# Patient Record
Sex: Female | Born: 1945
Health system: Southern US, Community
[De-identification: ages and names within clinical notes are randomized; demographics above are authoritative.]

## PROBLEM LIST (undated history)

## (undated) DIAGNOSIS — M1990 Unspecified osteoarthritis, unspecified site: Secondary | ICD-10-CM

## (undated) DIAGNOSIS — I10 Essential (primary) hypertension: Secondary | ICD-10-CM

## (undated) DIAGNOSIS — C801 Malignant (primary) neoplasm, unspecified: Secondary | ICD-10-CM

## (undated) DIAGNOSIS — J45909 Unspecified asthma, uncomplicated: Secondary | ICD-10-CM

## (undated) HISTORY — PX: APPENDECTOMY: SHX54

## (undated) HISTORY — PX: TUBAL LIGATION: SHX77

## (undated) HISTORY — PX: CHOLECYSTECTOMY: SHX55

---

## 2000-09-06 ENCOUNTER — Encounter: Payer: Self-pay | Admitting: Family Medicine

## 2000-09-06 ENCOUNTER — Encounter: Admission: RE | Admit: 2000-09-06 | Discharge: 2000-09-06 | Payer: Self-pay | Admitting: Family Medicine

## 2001-03-10 ENCOUNTER — Emergency Department (HOSPITAL_COMMUNITY): Admission: EM | Admit: 2001-03-10 | Discharge: 2001-03-10 | Payer: Self-pay | Admitting: Emergency Medicine

## 2004-03-16 ENCOUNTER — Encounter: Admission: RE | Admit: 2004-03-16 | Discharge: 2004-03-16 | Payer: Self-pay | Admitting: Sports Medicine

## 2004-04-05 ENCOUNTER — Encounter: Admission: RE | Admit: 2004-04-05 | Discharge: 2004-07-04 | Payer: Self-pay | Admitting: Family Medicine

## 2004-11-17 ENCOUNTER — Emergency Department (HOSPITAL_COMMUNITY): Admission: EM | Admit: 2004-11-17 | Discharge: 2004-11-17 | Payer: Self-pay | Admitting: Emergency Medicine

## 2005-07-22 ENCOUNTER — Ambulatory Visit (HOSPITAL_COMMUNITY): Admission: EM | Admit: 2005-07-22 | Discharge: 2005-07-22 | Payer: Self-pay | Admitting: Emergency Medicine

## 2005-07-23 ENCOUNTER — Ambulatory Visit: Payer: Self-pay | Admitting: Gastroenterology

## 2006-03-11 ENCOUNTER — Emergency Department (HOSPITAL_COMMUNITY): Admission: EM | Admit: 2006-03-11 | Discharge: 2006-03-11 | Payer: Self-pay | Admitting: Emergency Medicine

## 2007-02-05 ENCOUNTER — Ambulatory Visit (HOSPITAL_COMMUNITY): Admission: RE | Admit: 2007-02-05 | Discharge: 2007-02-05 | Payer: Self-pay | Admitting: Specialist

## 2007-02-24 ENCOUNTER — Encounter: Admission: RE | Admit: 2007-02-24 | Discharge: 2007-02-24 | Payer: Self-pay | Admitting: Orthopedic Surgery

## 2007-03-03 ENCOUNTER — Encounter: Admission: RE | Admit: 2007-03-03 | Discharge: 2007-03-03 | Payer: Self-pay | Admitting: Orthopedic Surgery

## 2007-03-26 ENCOUNTER — Encounter: Admission: RE | Admit: 2007-03-26 | Discharge: 2007-03-26 | Payer: Self-pay | Admitting: Orthopedic Surgery

## 2007-05-19 ENCOUNTER — Ambulatory Visit (HOSPITAL_COMMUNITY): Admission: RE | Admit: 2007-05-19 | Discharge: 2007-05-19 | Payer: Self-pay | Admitting: General Practice

## 2007-07-23 ENCOUNTER — Ambulatory Visit (HOSPITAL_COMMUNITY): Admission: RE | Admit: 2007-07-23 | Discharge: 2007-07-23 | Payer: Self-pay | Admitting: Orthopedic Surgery

## 2007-07-24 ENCOUNTER — Ambulatory Visit: Payer: Self-pay | Admitting: Internal Medicine

## 2007-07-29 ENCOUNTER — Ambulatory Visit (HOSPITAL_COMMUNITY): Admission: RE | Admit: 2007-07-29 | Discharge: 2007-07-29 | Payer: Self-pay | Admitting: Orthopedic Surgery

## 2007-07-31 LAB — CBC WITH DIFFERENTIAL/PLATELET
BASO%: 0.4 % (ref 0.0–2.0)
Basophils Absolute: 0 10*3/uL (ref 0.0–0.1)
EOS%: 1.1 % (ref 0.0–7.0)
HGB: 10.2 g/dL — ABNORMAL LOW (ref 11.6–15.9)
MCH: 32.9 pg (ref 26.0–34.0)
MCHC: 34.6 g/dL (ref 32.0–36.0)
MCV: 95.1 fL (ref 81.0–101.0)
MONO%: 10.1 % (ref 0.0–13.0)
NEUT%: 60.8 % (ref 39.6–76.8)
RDW: 13.4 % (ref 11.3–14.5)

## 2007-08-04 LAB — COMPREHENSIVE METABOLIC PANEL
ALT: 12 U/L (ref 0–35)
AST: 20 U/L (ref 0–37)
Alkaline Phosphatase: 101 U/L (ref 39–117)
BUN: 8 mg/dL (ref 6–23)
Creatinine, Ser: 0.73 mg/dL (ref 0.40–1.20)
Potassium: 4.2 mEq/L (ref 3.5–5.3)

## 2007-08-04 LAB — IMMUNOFIXATION ELECTROPHORESIS
IgA: 71 mg/dL (ref 68–378)
IgG (Immunoglobin G), Serum: 4110 mg/dL — ABNORMAL HIGH (ref 694–1618)

## 2007-08-04 LAB — KAPPA/LAMBDA LIGHT CHAINS: Kappa free light chain: 1.75 mg/dL (ref 0.33–1.94)

## 2007-08-07 ENCOUNTER — Ambulatory Visit (HOSPITAL_COMMUNITY): Admission: RE | Admit: 2007-08-07 | Discharge: 2007-08-07 | Payer: Self-pay | Admitting: Internal Medicine

## 2007-08-07 ENCOUNTER — Encounter (INDEPENDENT_AMBULATORY_CARE_PROVIDER_SITE_OTHER): Payer: Self-pay | Admitting: Diagnostic Radiology

## 2007-08-14 LAB — CBC WITH DIFFERENTIAL/PLATELET
Basophils Absolute: 0 10*3/uL (ref 0.0–0.1)
EOS%: 1.5 % (ref 0.0–7.0)
HGB: 9.8 g/dL — ABNORMAL LOW (ref 11.6–15.9)
LYMPH%: 28.7 % (ref 14.0–48.0)
MCH: 32.4 pg (ref 26.0–34.0)
MCV: 94.3 fL (ref 81.0–101.0)
MONO%: 9.5 % (ref 0.0–13.0)
RDW: 13.1 % (ref 11.3–14.5)

## 2007-08-14 LAB — LACTATE DEHYDROGENASE: LDH: 370 U/L — ABNORMAL HIGH (ref 94–250)

## 2007-08-28 LAB — CBC WITH DIFFERENTIAL/PLATELET
Basophils Absolute: 0 10*3/uL (ref 0.0–0.1)
Eosinophils Absolute: 0 10*3/uL (ref 0.0–0.5)
HGB: 10.2 g/dL — ABNORMAL LOW (ref 11.6–15.9)
LYMPH%: 18.8 % (ref 14.0–48.0)
MCV: 93.8 fL (ref 81.0–101.0)
MONO%: 1.3 % (ref 0.0–13.0)
NEUT#: 3.3 10*3/uL (ref 1.5–6.5)
NEUT%: 79.6 % — ABNORMAL HIGH (ref 39.6–76.8)
Platelets: 138 10*3/uL — ABNORMAL LOW (ref 145–400)

## 2007-09-04 LAB — CBC WITH DIFFERENTIAL/PLATELET
BASO%: 0.2 % (ref 0.0–2.0)
EOS%: 0.4 % (ref 0.0–7.0)
HCT: 29.5 % — ABNORMAL LOW (ref 34.8–46.6)
LYMPH%: 8.1 % — ABNORMAL LOW (ref 14.0–48.0)
MCH: 31.8 pg (ref 26.0–34.0)
MCHC: 34 g/dL (ref 32.0–36.0)
NEUT%: 89.1 % — ABNORMAL HIGH (ref 39.6–76.8)
Platelets: 169 10*3/uL (ref 145–400)

## 2007-09-04 LAB — COMPREHENSIVE METABOLIC PANEL
ALT: 59 U/L — ABNORMAL HIGH (ref 0–35)
AST: 33 U/L (ref 0–37)
BUN: 11 mg/dL (ref 6–23)
CO2: 24 mEq/L (ref 19–32)
Creatinine, Ser: 0.7 mg/dL (ref 0.40–1.20)
Total Bilirubin: 0.6 mg/dL (ref 0.3–1.2)

## 2007-09-11 ENCOUNTER — Ambulatory Visit: Payer: Self-pay | Admitting: Internal Medicine

## 2007-09-11 LAB — LACTATE DEHYDROGENASE: LDH: 204 U/L (ref 94–250)

## 2007-09-11 LAB — CBC WITH DIFFERENTIAL/PLATELET
Basophils Absolute: 0 10*3/uL (ref 0.0–0.1)
Eosinophils Absolute: 0 10*3/uL (ref 0.0–0.5)
HCT: 26.7 % — ABNORMAL LOW (ref 34.8–46.6)
HGB: 9.2 g/dL — ABNORMAL LOW (ref 11.6–15.9)
LYMPH%: 9.8 % — ABNORMAL LOW (ref 14.0–48.0)
MCHC: 34.5 g/dL (ref 32.0–36.0)
MONO#: 0.8 10*3/uL (ref 0.1–0.9)
NEUT#: 6 10*3/uL (ref 1.5–6.5)
NEUT%: 80.1 % — ABNORMAL HIGH (ref 39.6–76.8)
Platelets: 195 10*3/uL (ref 145–400)
WBC: 7.5 10*3/uL (ref 3.9–10.0)

## 2007-09-11 LAB — COMPREHENSIVE METABOLIC PANEL
BUN: 13 mg/dL (ref 6–23)
CO2: 21 mEq/L (ref 19–32)
Calcium: 8.1 mg/dL — ABNORMAL LOW (ref 8.4–10.5)
Creatinine, Ser: 0.6 mg/dL (ref 0.40–1.20)
Glucose, Bld: 114 mg/dL — ABNORMAL HIGH (ref 70–99)
Total Bilirubin: 0.7 mg/dL (ref 0.3–1.2)

## 2007-09-17 LAB — CBC WITH DIFFERENTIAL/PLATELET
BASO%: 0.6 % (ref 0.0–2.0)
Basophils Absolute: 0 10*3/uL (ref 0.0–0.1)
HCT: 28 % — ABNORMAL LOW (ref 34.8–46.6)
LYMPH%: 27.4 % (ref 14.0–48.0)
MCH: 32.5 pg (ref 26.0–34.0)
MCHC: 34.1 g/dL (ref 32.0–36.0)
MONO#: 0.8 10*3/uL (ref 0.1–0.9)
NEUT%: 58.6 % (ref 39.6–76.8)
Platelets: 259 10*3/uL (ref 145–400)
WBC: 6.3 10*3/uL (ref 3.9–10.0)

## 2007-09-17 LAB — COMPREHENSIVE METABOLIC PANEL
ALT: 18 U/L (ref 0–35)
AST: 14 U/L (ref 0–37)
CO2: 23 mEq/L (ref 19–32)
Calcium: 8.3 mg/dL — ABNORMAL LOW (ref 8.4–10.5)
Chloride: 107 mEq/L (ref 96–112)
Sodium: 142 mEq/L (ref 135–145)
Total Protein: 7.1 g/dL (ref 6.0–8.3)

## 2007-09-25 LAB — COMPREHENSIVE METABOLIC PANEL
ALT: 30 U/L (ref 0–35)
AST: 29 U/L (ref 0–37)
Alkaline Phosphatase: 345 U/L — ABNORMAL HIGH (ref 39–117)
BUN: 10 mg/dL (ref 6–23)
Creatinine, Ser: 0.64 mg/dL (ref 0.40–1.20)
Total Bilirubin: 0.8 mg/dL (ref 0.3–1.2)

## 2007-09-25 LAB — CBC WITH DIFFERENTIAL/PLATELET
BASO%: 0.1 % (ref 0.0–2.0)
EOS%: 0.4 % (ref 0.0–7.0)
HCT: 31.8 % — ABNORMAL LOW (ref 34.8–46.6)
LYMPH%: 6 % — ABNORMAL LOW (ref 14.0–48.0)
MCH: 31.9 pg (ref 26.0–34.0)
MCHC: 33.6 g/dL (ref 32.0–36.0)
MCV: 95.1 fL (ref 81.0–101.0)
MONO%: 1.4 % (ref 0.0–13.0)
NEUT%: 92.1 % — ABNORMAL HIGH (ref 39.6–76.8)
Platelets: 221 10*3/uL (ref 145–400)
lymph#: 0.5 10*3/uL — ABNORMAL LOW (ref 0.9–3.3)

## 2007-10-01 LAB — COMPREHENSIVE METABOLIC PANEL
ALT: 102 U/L — ABNORMAL HIGH (ref 0–35)
AST: 41 U/L — ABNORMAL HIGH (ref 0–37)
CO2: 25 mEq/L (ref 19–32)
Sodium: 142 mEq/L (ref 135–145)
Total Bilirubin: 0.6 mg/dL (ref 0.3–1.2)
Total Protein: 6.8 g/dL (ref 6.0–8.3)

## 2007-10-01 LAB — CBC WITH DIFFERENTIAL/PLATELET
Basophils Absolute: 0 10*3/uL (ref 0.0–0.1)
EOS%: 6.3 % (ref 0.0–7.0)
Eosinophils Absolute: 0.4 10*3/uL (ref 0.0–0.5)
HCT: 31.3 % — ABNORMAL LOW (ref 34.8–46.6)
HGB: 10.5 g/dL — ABNORMAL LOW (ref 11.6–15.9)
MCH: 31.4 pg (ref 26.0–34.0)
MCV: 93.6 fL (ref 81.0–101.0)
MONO%: 12.4 % (ref 0.0–13.0)
NEUT#: 4 10*3/uL (ref 1.5–6.5)
NEUT%: 56.1 % (ref 39.6–76.8)

## 2007-10-01 LAB — LACTATE DEHYDROGENASE: LDH: 186 U/L (ref 94–250)

## 2007-10-06 ENCOUNTER — Ambulatory Visit: Admission: RE | Admit: 2007-10-06 | Discharge: 2007-10-22 | Payer: Self-pay | Admitting: Radiation Oncology

## 2007-10-13 ENCOUNTER — Ambulatory Visit (HOSPITAL_COMMUNITY): Admission: RE | Admit: 2007-10-13 | Discharge: 2007-10-13 | Payer: Self-pay | Admitting: Radiation Oncology

## 2007-10-14 LAB — CBC WITH DIFFERENTIAL/PLATELET
BASO%: 0.8 % (ref 0.0–2.0)
Basophils Absolute: 0.1 10*3/uL (ref 0.0–0.1)
EOS%: 2.5 % (ref 0.0–7.0)
HCT: 33.5 % — ABNORMAL LOW (ref 34.8–46.6)
LYMPH%: 23.4 % (ref 14.0–48.0)
MCH: 30.9 pg (ref 26.0–34.0)
MCHC: 32.8 g/dL (ref 32.0–36.0)
MCV: 94.3 fL (ref 81.0–101.0)
MONO%: 10.5 % (ref 0.0–13.0)
NEUT%: 62.8 % (ref 39.6–76.8)
Platelets: 260 10*3/uL (ref 145–400)

## 2007-10-15 LAB — IGG, IGA, IGM
IgA: 292 mg/dL (ref 68–378)
IgG (Immunoglobin G), Serum: 1350 mg/dL (ref 694–1618)

## 2007-10-15 LAB — COMPREHENSIVE METABOLIC PANEL
ALT: 18 U/L (ref 0–35)
AST: 16 U/L (ref 0–37)
BUN: 8 mg/dL (ref 6–23)
Creatinine, Ser: 0.44 mg/dL (ref 0.40–1.20)
Total Bilirubin: 0.6 mg/dL (ref 0.3–1.2)

## 2007-10-15 LAB — KAPPA/LAMBDA LIGHT CHAINS
Kappa free light chain: 1.03 mg/dL (ref 0.33–1.94)
Lambda Free Lght Chn: 1.85 mg/dL (ref 0.57–2.63)

## 2007-10-15 LAB — BETA 2 MICROGLOBULIN, SERUM: Beta-2 Microglobulin: 1.65 mg/L (ref 1.01–1.73)

## 2007-10-23 ENCOUNTER — Ambulatory Visit: Admission: RE | Admit: 2007-10-23 | Discharge: 2007-12-09 | Payer: Self-pay | Admitting: Radiation Oncology

## 2007-10-27 ENCOUNTER — Ambulatory Visit: Payer: Self-pay | Admitting: Internal Medicine

## 2007-10-28 ENCOUNTER — Ambulatory Visit (HOSPITAL_COMMUNITY): Admission: EM | Admit: 2007-10-28 | Discharge: 2007-10-28 | Payer: Self-pay | Admitting: Emergency Medicine

## 2007-10-28 ENCOUNTER — Encounter: Payer: Self-pay | Admitting: Internal Medicine

## 2007-11-04 LAB — BASIC METABOLIC PANEL
BUN: 8 mg/dL (ref 6–23)
Calcium: 7.2 mg/dL — ABNORMAL LOW (ref 8.4–10.5)
Creatinine, Ser: 0.55 mg/dL (ref 0.40–1.20)
Glucose, Bld: 86 mg/dL (ref 70–99)
Potassium: 3.4 mEq/L — ABNORMAL LOW (ref 3.5–5.3)

## 2007-11-05 ENCOUNTER — Ambulatory Visit: Payer: Self-pay | Admitting: Internal Medicine

## 2007-11-26 LAB — BASIC METABOLIC PANEL
BUN: 9 mg/dL (ref 6–23)
Chloride: 106 mEq/L (ref 96–112)
Glucose, Bld: 100 mg/dL — ABNORMAL HIGH (ref 70–99)
Potassium: 3.2 mEq/L — ABNORMAL LOW (ref 3.5–5.3)

## 2007-12-02 ENCOUNTER — Ambulatory Visit: Payer: Self-pay | Admitting: Internal Medicine

## 2007-12-02 LAB — CBC WITH DIFFERENTIAL/PLATELET
Eosinophils Absolute: 0.3 10*3/uL (ref 0.0–0.5)
LYMPH%: 9.5 % — ABNORMAL LOW (ref 14.0–48.0)
MCV: 90.2 fL (ref 81.0–101.0)
MONO%: 19.7 % — ABNORMAL HIGH (ref 0.0–13.0)
NEUT#: 5.4 10*3/uL (ref 1.5–6.5)
Platelets: 165 10*3/uL (ref 145–400)
RBC: 3.83 10*6/uL (ref 3.70–5.32)

## 2007-12-04 LAB — COMPREHENSIVE METABOLIC PANEL
ALT: 19 U/L (ref 0–35)
Albumin: 3.8 g/dL (ref 3.5–5.2)
CO2: 28 mEq/L (ref 19–32)
Calcium: 8.2 mg/dL — ABNORMAL LOW (ref 8.4–10.5)
Chloride: 102 mEq/L (ref 96–112)
Glucose, Bld: 108 mg/dL — ABNORMAL HIGH (ref 70–99)
Potassium: 3 mEq/L — ABNORMAL LOW (ref 3.5–5.3)
Sodium: 141 mEq/L (ref 135–145)
Total Bilirubin: 1 mg/dL (ref 0.3–1.2)
Total Protein: 6.2 g/dL (ref 6.0–8.3)

## 2007-12-04 LAB — KAPPA/LAMBDA LIGHT CHAINS
Kappa:Lambda Ratio: 1.1 (ref 0.26–1.65)
Lambda Free Lght Chn: 1.34 mg/dL (ref 0.57–2.63)

## 2007-12-04 LAB — LACTATE DEHYDROGENASE: LDH: 213 U/L (ref 94–250)

## 2007-12-04 LAB — IGG, IGA, IGM
IgA: 208 mg/dL (ref 68–378)
IgG (Immunoglobin G), Serum: 888 mg/dL (ref 694–1618)
IgM, Serum: 81 mg/dL (ref 60–263)

## 2007-12-04 LAB — BETA 2 MICROGLOBULIN, SERUM: Beta-2 Microglobulin: 1.24 mg/L (ref 1.01–1.73)

## 2007-12-06 ENCOUNTER — Emergency Department (HOSPITAL_COMMUNITY): Admission: EM | Admit: 2007-12-06 | Discharge: 2007-12-06 | Payer: Self-pay | Admitting: Emergency Medicine

## 2007-12-12 ENCOUNTER — Ambulatory Visit (HOSPITAL_COMMUNITY): Admission: RE | Admit: 2007-12-12 | Discharge: 2007-12-12 | Payer: Self-pay | Admitting: Radiation Oncology

## 2007-12-24 LAB — BASIC METABOLIC PANEL
BUN: 15 mg/dL (ref 6–23)
CO2: 24 mEq/L (ref 19–32)
Chloride: 103 mEq/L (ref 96–112)
Potassium: 3.4 mEq/L — ABNORMAL LOW (ref 3.5–5.3)

## 2008-01-06 LAB — CBC WITH DIFFERENTIAL/PLATELET
Basophils Absolute: 0 10*3/uL (ref 0.0–0.1)
Eosinophils Absolute: 0 10*3/uL (ref 0.0–0.5)
HGB: 12.4 g/dL (ref 11.6–15.9)
LYMPH%: 9.2 % — ABNORMAL LOW (ref 14.0–48.0)
MCV: 89.1 fL (ref 81.0–101.0)
MONO%: 17.3 % — ABNORMAL HIGH (ref 0.0–13.0)
NEUT#: 3.4 10*3/uL (ref 1.5–6.5)
Platelets: 161 10*3/uL (ref 145–400)

## 2008-01-06 LAB — COMPREHENSIVE METABOLIC PANEL
Alkaline Phosphatase: 118 U/L — ABNORMAL HIGH (ref 39–117)
BUN: 8 mg/dL (ref 6–23)
Creatinine, Ser: 0.51 mg/dL (ref 0.40–1.20)
Glucose, Bld: 94 mg/dL (ref 70–99)
Total Bilirubin: 1.4 mg/dL — ABNORMAL HIGH (ref 0.3–1.2)

## 2008-01-19 ENCOUNTER — Ambulatory Visit: Payer: Self-pay | Admitting: Internal Medicine

## 2008-01-21 LAB — BASIC METABOLIC PANEL
BUN: 7 mg/dL (ref 6–23)
CO2: 28 mEq/L (ref 19–32)
Glucose, Bld: 71 mg/dL (ref 70–99)
Potassium: 3 mEq/L — ABNORMAL LOW (ref 3.5–5.3)
Sodium: 142 mEq/L (ref 135–145)

## 2008-01-26 ENCOUNTER — Ambulatory Visit: Payer: Self-pay | Admitting: Internal Medicine

## 2008-01-26 ENCOUNTER — Encounter: Payer: Self-pay | Admitting: Internal Medicine

## 2008-01-26 ENCOUNTER — Ambulatory Visit (HOSPITAL_COMMUNITY): Admission: RE | Admit: 2008-01-26 | Discharge: 2008-01-26 | Payer: Self-pay | Admitting: Internal Medicine

## 2008-02-18 LAB — BASIC METABOLIC PANEL
CO2: 24 mEq/L (ref 19–32)
Calcium: 8.9 mg/dL (ref 8.4–10.5)
Creatinine, Ser: 0.51 mg/dL (ref 0.40–1.20)
Glucose, Bld: 88 mg/dL (ref 70–99)

## 2008-02-19 ENCOUNTER — Ambulatory Visit: Payer: Self-pay

## 2008-02-19 ENCOUNTER — Encounter: Payer: Self-pay | Admitting: Internal Medicine

## 2008-02-19 LAB — URIC ACID: Uric Acid, Serum: 5.7 mg/dL (ref 2.4–7.0)

## 2008-03-01 LAB — CBC WITH DIFFERENTIAL/PLATELET
Basophils Absolute: 0 10*3/uL (ref 0.0–0.1)
Eosinophils Absolute: 0.1 10*3/uL (ref 0.0–0.5)
HGB: 12 g/dL (ref 11.6–15.9)
LYMPH%: 3 % — ABNORMAL LOW (ref 14.0–48.0)
MCV: 94.6 fL (ref 81.0–101.0)
MONO#: 0 10*3/uL — ABNORMAL LOW (ref 0.1–0.9)
NEUT#: 14.9 10*3/uL — ABNORMAL HIGH (ref 1.5–6.5)
Platelets: 81 10*3/uL — ABNORMAL LOW (ref 145–400)
RBC: 3.74 10*6/uL (ref 3.70–5.32)
RDW: 16.6 % — ABNORMAL HIGH (ref 11.3–14.5)
WBC: 15.5 10*3/uL — ABNORMAL HIGH (ref 3.9–10.0)

## 2008-03-29 ENCOUNTER — Ambulatory Visit: Payer: Self-pay | Admitting: Internal Medicine

## 2008-05-04 LAB — CBC WITH DIFFERENTIAL/PLATELET
Basophils Absolute: 0 10*3/uL (ref 0.0–0.1)
EOS%: 10.2 % — ABNORMAL HIGH (ref 0.0–7.0)
HCT: 35 % (ref 34.8–46.6)
HGB: 11.8 g/dL (ref 11.6–15.9)
MCH: 30.8 pg (ref 26.0–34.0)
MONO#: 0.9 10*3/uL (ref 0.1–0.9)
NEUT%: 55 % (ref 39.6–76.8)
Platelets: 164 10*3/uL (ref 145–400)
lymph#: 1.1 10*3/uL (ref 0.9–3.3)

## 2008-05-04 LAB — COMPREHENSIVE METABOLIC PANEL
Alkaline Phosphatase: 103 U/L (ref 39–117)
BUN: 6 mg/dL (ref 6–23)
CO2: 29 mEq/L (ref 19–32)
Glucose, Bld: 85 mg/dL (ref 70–99)
Sodium: 140 mEq/L (ref 135–145)
Total Bilirubin: 0.7 mg/dL (ref 0.3–1.2)
Total Protein: 6.6 g/dL (ref 6.0–8.3)

## 2008-06-17 ENCOUNTER — Ambulatory Visit (HOSPITAL_COMMUNITY): Admission: RE | Admit: 2008-06-17 | Discharge: 2008-06-17 | Payer: Self-pay | Admitting: Internal Medicine

## 2008-06-23 ENCOUNTER — Ambulatory Visit: Payer: Self-pay | Admitting: Internal Medicine

## 2008-06-29 LAB — CBC WITH DIFFERENTIAL/PLATELET
Basophils Absolute: 0 10*3/uL (ref 0.0–0.1)
EOS%: 0.8 % (ref 0.0–7.0)
HCT: 36.7 % (ref 34.8–46.6)
HGB: 12.4 g/dL (ref 11.6–15.9)
LYMPH%: 31.9 % (ref 14.0–48.0)
MCH: 30.9 pg (ref 26.0–34.0)
MCHC: 33.8 g/dL (ref 32.0–36.0)
MCV: 91.5 fL (ref 81.0–101.0)
MONO%: 10.8 % (ref 0.0–13.0)
NEUT%: 56.1 % (ref 39.6–76.8)
Platelets: 149 10*3/uL (ref 145–400)

## 2008-06-29 LAB — COMPREHENSIVE METABOLIC PANEL
AST: 21 U/L (ref 0–37)
BUN: 7 mg/dL (ref 6–23)
Calcium: 8.8 mg/dL (ref 8.4–10.5)
Chloride: 103 mEq/L (ref 96–112)
Creatinine, Ser: 0.57 mg/dL (ref 0.40–1.20)
Total Bilirubin: 0.6 mg/dL (ref 0.3–1.2)

## 2008-09-08 ENCOUNTER — Ambulatory Visit (HOSPITAL_COMMUNITY): Admission: RE | Admit: 2008-09-08 | Discharge: 2008-09-08 | Payer: Self-pay | Admitting: Internal Medicine

## 2008-09-15 ENCOUNTER — Ambulatory Visit: Payer: Self-pay | Admitting: Internal Medicine

## 2008-09-23 ENCOUNTER — Ambulatory Visit: Payer: Self-pay | Admitting: Internal Medicine

## 2008-09-23 LAB — CBC WITH DIFFERENTIAL/PLATELET
BASO%: 0.3 % (ref 0.0–2.0)
Basophils Absolute: 0 10*3/uL (ref 0.0–0.1)
EOS%: 1.6 % (ref 0.0–7.0)
HCT: 36.5 % (ref 34.8–46.6)
HGB: 12.3 g/dL (ref 11.6–15.9)
LYMPH%: 21.8 % (ref 14.0–48.0)
MCH: 31.3 pg (ref 26.0–34.0)
MCHC: 33.7 g/dL (ref 32.0–36.0)
MCV: 93 fL (ref 81.0–101.0)
MONO%: 8 % (ref 0.0–13.0)
NEUT%: 68.3 % (ref 39.6–76.8)
lymph#: 1.7 10*3/uL (ref 0.9–3.3)

## 2008-09-24 LAB — BETA 2 MICROGLOBULIN, SERUM: Beta-2 Microglobulin: 1.42 mg/L (ref 1.01–1.73)

## 2008-09-24 LAB — COMPREHENSIVE METABOLIC PANEL
ALT: 18 U/L (ref 0–35)
AST: 22 U/L (ref 0–37)
Alkaline Phosphatase: 116 U/L (ref 39–117)
BUN: 16 mg/dL (ref 6–23)
Calcium: 9.1 mg/dL (ref 8.4–10.5)
Creatinine, Ser: 0.7 mg/dL (ref 0.40–1.20)
Total Bilirubin: 1 mg/dL (ref 0.3–1.2)

## 2008-09-24 LAB — IGG, IGA, IGM
IgA: 77 mg/dL (ref 68–378)
IgM, Serum: 82 mg/dL (ref 60–263)

## 2008-12-24 ENCOUNTER — Ambulatory Visit: Payer: Self-pay | Admitting: Internal Medicine

## 2008-12-28 LAB — CBC WITH DIFFERENTIAL/PLATELET
BASO%: 0.3 % (ref 0.0–2.0)
Basophils Absolute: 0 10*3/uL (ref 0.0–0.1)
EOS%: 1.9 % (ref 0.0–7.0)
HCT: 37.5 % (ref 34.8–46.6)
HGB: 12.4 g/dL (ref 11.6–15.9)
MCH: 31.1 pg (ref 25.1–34.0)
MCHC: 33 g/dL (ref 31.5–36.0)
MCV: 94.1 fL (ref 79.5–101.0)
MONO%: 9.1 % (ref 0.0–14.0)
NEUT%: 49.2 % (ref 38.4–76.8)
RDW: 14.1 % (ref 11.2–14.5)

## 2008-12-29 LAB — IGG, IGA, IGM
IgA: 96 mg/dL (ref 68–378)
IgM, Serum: 138 mg/dL (ref 60–263)

## 2008-12-29 LAB — COMPREHENSIVE METABOLIC PANEL
ALT: 13 U/L (ref 0–35)
AST: 15 U/L (ref 0–37)
Alkaline Phosphatase: 110 U/L (ref 39–117)
BUN: 11 mg/dL (ref 6–23)
Creatinine, Ser: 0.66 mg/dL (ref 0.40–1.20)

## 2008-12-29 LAB — KAPPA/LAMBDA LIGHT CHAINS
Kappa:Lambda Ratio: 0.45 (ref 0.26–1.65)
Lambda Free Lght Chn: 2.06 mg/dL (ref 0.57–2.63)

## 2008-12-29 LAB — BETA 2 MICROGLOBULIN, SERUM: Beta-2 Microglobulin: 1.46 mg/L (ref 1.01–1.73)

## 2009-03-30 ENCOUNTER — Ambulatory Visit: Payer: Self-pay | Admitting: Internal Medicine

## 2009-04-01 LAB — CBC WITH DIFFERENTIAL/PLATELET
BASO%: 0.3 % (ref 0.0–2.0)
EOS%: 1.1 % (ref 0.0–7.0)
LYMPH%: 20.4 % (ref 14.0–49.7)
MCH: 32 pg (ref 25.1–34.0)
MCHC: 34.5 g/dL (ref 31.5–36.0)
MCV: 92.8 fL (ref 79.5–101.0)
MONO%: 5.8 % (ref 0.0–14.0)
NEUT%: 72.4 % (ref 38.4–76.8)
Platelets: 166 10*3/uL (ref 145–400)
RBC: 4.17 10*6/uL (ref 3.70–5.45)
WBC: 9.1 10*3/uL (ref 3.9–10.3)

## 2009-04-04 LAB — COMPREHENSIVE METABOLIC PANEL
ALT: 19 U/L (ref 0–35)
Alkaline Phosphatase: 89 U/L (ref 39–117)
Creatinine, Ser: 0.68 mg/dL (ref 0.40–1.20)
Sodium: 141 mEq/L (ref 135–145)
Total Bilirubin: 0.6 mg/dL (ref 0.3–1.2)
Total Protein: 7.6 g/dL (ref 6.0–8.3)

## 2009-04-04 LAB — IGG, IGA, IGM
IgG (Immunoglobin G), Serum: 1300 mg/dL (ref 694–1618)
IgM, Serum: 114 mg/dL (ref 60–263)

## 2009-04-04 LAB — KAPPA/LAMBDA LIGHT CHAINS
Kappa:Lambda Ratio: 0.51 (ref 0.26–1.65)
Lambda Free Lght Chn: 2.05 mg/dL (ref 0.57–2.63)

## 2009-04-04 LAB — BETA 2 MICROGLOBULIN, SERUM: Beta-2 Microglobulin: 1.45 mg/L (ref 1.01–1.73)

## 2009-07-11 ENCOUNTER — Ambulatory Visit: Payer: Self-pay | Admitting: Internal Medicine

## 2009-10-08 ENCOUNTER — Emergency Department (HOSPITAL_COMMUNITY): Admission: EM | Admit: 2009-10-08 | Discharge: 2009-10-08 | Payer: Self-pay | Admitting: Emergency Medicine

## 2009-10-12 ENCOUNTER — Ambulatory Visit: Payer: Self-pay | Admitting: Internal Medicine

## 2009-10-17 LAB — CBC WITH DIFFERENTIAL/PLATELET
BASO%: 0.1 % (ref 0.0–2.0)
Basophils Absolute: 0 10*3/uL (ref 0.0–0.1)
EOS%: 1.5 % (ref 0.0–7.0)
HCT: 39.1 % (ref 34.8–46.6)
HGB: 12.5 g/dL (ref 11.6–15.9)
MCH: 29.4 pg (ref 25.1–34.0)
MONO#: 0.5 10*3/uL (ref 0.1–0.9)
NEUT%: 50.8 % (ref 38.4–76.8)
RDW: 13.2 % (ref 11.2–14.5)
WBC: 8.1 10*3/uL (ref 3.9–10.3)
lymph#: 3.3 10*3/uL (ref 0.9–3.3)

## 2009-10-18 LAB — COMPREHENSIVE METABOLIC PANEL
Alkaline Phosphatase: 90 U/L (ref 39–117)
CO2: 24 mEq/L (ref 19–32)
Chloride: 105 mEq/L (ref 96–112)
Creatinine, Ser: 0.59 mg/dL (ref 0.40–1.20)
Glucose, Bld: 78 mg/dL (ref 70–99)
Potassium: 3.9 mEq/L (ref 3.5–5.3)
Total Bilirubin: 0.5 mg/dL (ref 0.3–1.2)
Total Protein: 7.4 g/dL (ref 6.0–8.3)

## 2009-10-18 LAB — IGG, IGA, IGM
IgA: 144 mg/dL (ref 68–378)
IgG (Immunoglobin G), Serum: 1510 mg/dL (ref 694–1618)
IgM, Serum: 147 mg/dL (ref 60–263)

## 2009-10-18 LAB — KAPPA/LAMBDA LIGHT CHAINS: Kappa free light chain: 1.21 mg/dL (ref 0.33–1.94)

## 2009-10-18 LAB — BETA 2 MICROGLOBULIN, SERUM: Beta-2 Microglobulin: 1.37 mg/L (ref 1.01–1.73)

## 2010-01-13 ENCOUNTER — Ambulatory Visit: Payer: Self-pay | Admitting: Internal Medicine

## 2010-01-17 LAB — CBC WITH DIFFERENTIAL/PLATELET
Basophils Absolute: 0 10*3/uL (ref 0.0–0.1)
Eosinophils Absolute: 0.2 10*3/uL (ref 0.0–0.5)
HCT: 37.7 % (ref 34.8–46.6)
HGB: 12.6 g/dL (ref 11.6–15.9)
MCV: 95.1 fL (ref 79.5–101.0)
MONO%: 9.1 % (ref 0.0–14.0)
NEUT#: 6.5 10*3/uL (ref 1.5–6.5)
NEUT%: 61.7 % (ref 38.4–76.8)
Platelets: 180 10*3/uL (ref 145–400)
RDW: 14.3 % (ref 11.2–14.5)

## 2010-01-18 LAB — COMPREHENSIVE METABOLIC PANEL
ALT: 18 U/L (ref 0–35)
Albumin: 4.3 g/dL (ref 3.5–5.2)
CO2: 24 mEq/L (ref 19–32)
Calcium: 9.3 mg/dL (ref 8.4–10.5)
Chloride: 104 mEq/L (ref 96–112)
Creatinine, Ser: 0.72 mg/dL (ref 0.40–1.20)
Potassium: 3.7 mEq/L (ref 3.5–5.3)
Total Protein: 7.5 g/dL (ref 6.0–8.3)

## 2010-01-18 LAB — KAPPA/LAMBDA LIGHT CHAINS
Kappa:Lambda Ratio: 0.65 (ref 0.26–1.65)
Lambda Free Lght Chn: 1.94 mg/dL (ref 0.57–2.63)

## 2010-01-18 LAB — IGG, IGA, IGM: IgM, Serum: 142 mg/dL (ref 60–263)

## 2010-01-18 LAB — LACTATE DEHYDROGENASE: LDH: 158 U/L (ref 94–250)

## 2010-04-19 ENCOUNTER — Ambulatory Visit: Payer: Self-pay | Admitting: Internal Medicine

## 2010-04-21 LAB — CBC WITH DIFFERENTIAL/PLATELET
Basophils Absolute: 0 10*3/uL (ref 0.0–0.1)
Eosinophils Absolute: 0.2 10*3/uL (ref 0.0–0.5)
HCT: 37.3 % (ref 34.8–46.6)
HGB: 12.4 g/dL (ref 11.6–15.9)
NEUT#: 4.6 10*3/uL (ref 1.5–6.5)
NEUT%: 55.3 % (ref 38.4–76.8)
RDW: 13.9 % (ref 11.2–14.5)
lymph#: 2.8 10*3/uL (ref 0.9–3.3)

## 2010-04-25 LAB — COMPREHENSIVE METABOLIC PANEL
Albumin: 4.2 g/dL (ref 3.5–5.2)
BUN: 20 mg/dL (ref 6–23)
CO2: 25 mEq/L (ref 19–32)
Calcium: 9.8 mg/dL (ref 8.4–10.5)
Chloride: 101 mEq/L (ref 96–112)
Creatinine, Ser: 0.61 mg/dL (ref 0.40–1.20)
Glucose, Bld: 98 mg/dL (ref 70–99)
Potassium: 4 mEq/L (ref 3.5–5.3)

## 2010-04-25 LAB — IGG, IGA, IGM
IgA: 172 mg/dL (ref 68–378)
IgG (Immunoglobin G), Serum: 1480 mg/dL (ref 694–1618)

## 2010-04-25 LAB — LACTATE DEHYDROGENASE: LDH: 166 U/L (ref 94–250)

## 2010-04-25 LAB — KAPPA/LAMBDA LIGHT CHAINS: Lambda Free Lght Chn: 2 mg/dL (ref 0.57–2.63)

## 2010-07-02 ENCOUNTER — Ambulatory Visit (HOSPITAL_COMMUNITY): Admission: EM | Admit: 2010-07-02 | Discharge: 2010-07-02 | Payer: Self-pay | Admitting: Emergency Medicine

## 2010-07-17 ENCOUNTER — Ambulatory Visit: Payer: Self-pay | Admitting: Internal Medicine

## 2010-07-19 LAB — CBC WITH DIFFERENTIAL/PLATELET
Basophils Absolute: 0 10*3/uL (ref 0.0–0.1)
EOS%: 2.5 % (ref 0.0–7.0)
Eosinophils Absolute: 0.2 10*3/uL (ref 0.0–0.5)
HGB: 11.7 g/dL (ref 11.6–15.9)
MCH: 29.8 pg (ref 25.1–34.0)
NEUT#: 4.8 10*3/uL (ref 1.5–6.5)
RDW: 14.4 % (ref 11.2–14.5)
WBC: 9.8 10*3/uL (ref 3.9–10.3)
lymph#: 3.8 10*3/uL — ABNORMAL HIGH (ref 0.9–3.3)

## 2010-07-20 LAB — IGG, IGA, IGM
IgA: 209 mg/dL (ref 68–378)
IgM, Serum: 192 mg/dL (ref 60–263)

## 2010-07-20 LAB — COMPREHENSIVE METABOLIC PANEL
AST: 30 U/L (ref 0–37)
Albumin: 4 g/dL (ref 3.5–5.2)
BUN: 22 mg/dL (ref 6–23)
Calcium: 9.8 mg/dL (ref 8.4–10.5)
Chloride: 105 mEq/L (ref 96–112)
Potassium: 4.1 mEq/L (ref 3.5–5.3)

## 2010-07-20 LAB — KAPPA/LAMBDA LIGHT CHAINS
Kappa:Lambda Ratio: 0.53 (ref 0.26–1.65)
Lambda Free Lght Chn: 2.26 mg/dL (ref 0.57–2.63)

## 2010-07-20 LAB — LACTATE DEHYDROGENASE: LDH: 183 U/L (ref 94–250)

## 2010-10-18 ENCOUNTER — Ambulatory Visit: Payer: Self-pay | Admitting: Internal Medicine

## 2010-10-24 LAB — CBC WITH DIFFERENTIAL/PLATELET
BASO%: 0.3 % (ref 0.0–2.0)
EOS%: 1.9 % (ref 0.0–7.0)
MCH: 31.3 pg (ref 25.1–34.0)
MCHC: 33.7 g/dL (ref 31.5–36.0)
RBC: 4.13 10*6/uL (ref 3.70–5.45)
RDW: 13.7 % (ref 11.2–14.5)
lymph#: 3.5 10*3/uL — ABNORMAL HIGH (ref 0.9–3.3)

## 2010-10-25 LAB — COMPREHENSIVE METABOLIC PANEL
ALT: 13 U/L (ref 0–35)
AST: 18 U/L (ref 0–37)
Albumin: 4 g/dL (ref 3.5–5.2)
Alkaline Phosphatase: 88 U/L (ref 39–117)
BUN: 13 mg/dL (ref 6–23)
CO2: 26 mEq/L (ref 19–32)
Calcium: 8.9 mg/dL (ref 8.4–10.5)
Chloride: 103 mEq/L (ref 96–112)
Creatinine, Ser: 0.6 mg/dL (ref 0.40–1.20)
Glucose, Bld: 82 mg/dL (ref 70–99)
Potassium: 4 mEq/L (ref 3.5–5.3)
Sodium: 140 mEq/L (ref 135–145)
Total Bilirubin: 0.6 mg/dL (ref 0.3–1.2)
Total Protein: 7.4 g/dL (ref 6.0–8.3)

## 2010-10-25 LAB — LACTATE DEHYDROGENASE: LDH: 173 U/L (ref 94–250)

## 2010-10-25 LAB — KAPPA/LAMBDA LIGHT CHAINS
Kappa free light chain: 1.56 mg/dL (ref 0.33–1.94)
Kappa:Lambda Ratio: 0.93 (ref 0.26–1.65)
Lambda Free Lght Chn: 1.68 mg/dL (ref 0.57–2.63)

## 2010-10-25 LAB — IGG, IGA, IGM
IgA: 201 mg/dL (ref 68–378)
IgG (Immunoglobin G), Serum: 1810 mg/dL — ABNORMAL HIGH (ref 694–1618)
IgM, Serum: 175 mg/dL (ref 60–263)

## 2010-10-25 LAB — BETA 2 MICROGLOBULIN, SERUM: Beta-2 Microglobulin: 1.85 mg/L — ABNORMAL HIGH (ref 1.01–1.73)

## 2010-11-11 ENCOUNTER — Encounter: Payer: Self-pay | Admitting: Gastroenterology

## 2010-11-12 ENCOUNTER — Encounter: Payer: Self-pay | Admitting: Orthopedic Surgery

## 2011-01-04 LAB — CBC
HCT: 40.7 % (ref 36.0–46.0)
Hemoglobin: 13.7 g/dL (ref 12.0–15.0)
MCV: 92.1 fL (ref 78.0–100.0)
RBC: 4.42 MIL/uL (ref 3.87–5.11)
WBC: 8.5 10*3/uL (ref 4.0–10.5)

## 2011-01-04 LAB — DIFFERENTIAL
Basophils Absolute: 0 10*3/uL (ref 0.0–0.1)
Eosinophils Relative: 1 % (ref 0–5)
Lymphocytes Relative: 15 % (ref 12–46)
Lymphs Abs: 1.3 10*3/uL (ref 0.7–4.0)
Monocytes Absolute: 0.6 10*3/uL (ref 0.1–1.0)
Neutro Abs: 6.5 10*3/uL (ref 1.7–7.7)

## 2011-01-04 LAB — BASIC METABOLIC PANEL
Chloride: 101 mEq/L (ref 96–112)
GFR calc Af Amer: >60 mL/min (ref >60)
GFR calc non Af Amer: >60 mL/min (ref >60)
Potassium: 3.4 mEq/L — ABNORMAL LOW (ref 3.5–5.1)
Sodium: 139 mEq/L (ref 135–145)

## 2011-01-22 LAB — POCT I-STAT, CHEM 8
BUN: 8 mg/dL (ref 6–23)
Calcium, Ion: 1.17 mmol/L (ref 1.12–1.32)
Creatinine, Ser: 0.6 mg/dL (ref 0.4–1.2)
Glucose, Bld: 94 mg/dL (ref 70–99)
Hemoglobin: 12.9 g/dL (ref 12.0–15.0)
Sodium: 138 mEq/L (ref 135–145)
TCO2: 29 mmol/L (ref 0–100)

## 2011-02-06 ENCOUNTER — Other Ambulatory Visit: Payer: Self-pay | Admitting: Internal Medicine

## 2011-02-06 ENCOUNTER — Encounter (HOSPITAL_BASED_OUTPATIENT_CLINIC_OR_DEPARTMENT_OTHER): Payer: Medicare Other | Admitting: Internal Medicine

## 2011-02-06 DIAGNOSIS — C9000 Multiple myeloma not having achieved remission: Secondary | ICD-10-CM

## 2011-02-06 LAB — CBC WITH DIFFERENTIAL/PLATELET
Basophils Absolute: 0 10*3/uL (ref 0.0–0.1)
Eosinophils Absolute: 0.1 10*3/uL (ref 0.0–0.5)
HCT: 34.4 % — ABNORMAL LOW (ref 34.8–46.6)
HGB: 11.4 g/dL — ABNORMAL LOW (ref 11.6–15.9)
LYMPH%: 39.5 % (ref 14.0–49.7)
MCV: 93.9 fL (ref 79.5–101.0)
MONO#: 0.7 10*3/uL (ref 0.1–0.9)
MONO%: 9.2 % (ref 0.0–14.0)
NEUT#: 3.5 10*3/uL (ref 1.5–6.5)
Platelets: 155 10*3/uL (ref 145–400)
RBC: 3.66 10*6/uL — ABNORMAL LOW (ref 3.70–5.45)
WBC: 7.1 10*3/uL (ref 3.9–10.3)

## 2011-02-07 LAB — COMPREHENSIVE METABOLIC PANEL
Albumin: 3.9 g/dL (ref 3.5–5.2)
Alkaline Phosphatase: 93 U/L (ref 39–117)
BUN: 12 mg/dL (ref 6–23)
CO2: 28 mEq/L (ref 19–32)
Glucose, Bld: 72 mg/dL (ref 70–99)
Potassium: 4 mEq/L (ref 3.5–5.3)
Sodium: 140 mEq/L (ref 135–145)
Total Bilirubin: 0.3 mg/dL (ref 0.3–1.2)
Total Protein: 6.9 g/dL (ref 6.0–8.3)

## 2011-02-07 LAB — IGG, IGA, IGM
IgA: 184 mg/dL (ref 68–378)
IgG (Immunoglobin G), Serum: 1330 mg/dL (ref 694–1618)

## 2011-02-07 LAB — LACTATE DEHYDROGENASE: LDH: 203 U/L (ref 94–250)

## 2011-02-13 ENCOUNTER — Other Ambulatory Visit: Payer: Self-pay | Admitting: Internal Medicine

## 2011-02-13 ENCOUNTER — Encounter (HOSPITAL_BASED_OUTPATIENT_CLINIC_OR_DEPARTMENT_OTHER): Payer: Medicare Other | Admitting: Internal Medicine

## 2011-02-13 DIAGNOSIS — M545 Low back pain, unspecified: Secondary | ICD-10-CM

## 2011-02-13 DIAGNOSIS — C9000 Multiple myeloma not having achieved remission: Secondary | ICD-10-CM

## 2011-02-19 ENCOUNTER — Ambulatory Visit (HOSPITAL_COMMUNITY)
Admission: RE | Admit: 2011-02-19 | Discharge: 2011-02-19 | Disposition: A | Discharge status: Home / Self Care | Payer: Medicare Other | Source: Ambulatory Visit | Attending: Internal Medicine | Admitting: Internal Medicine

## 2011-02-19 DIAGNOSIS — M129 Arthropathy, unspecified: Secondary | ICD-10-CM | POA: Insufficient documentation

## 2011-02-19 DIAGNOSIS — M47817 Spondylosis without myelopathy or radiculopathy, lumbosacral region: Secondary | ICD-10-CM | POA: Insufficient documentation

## 2011-02-19 DIAGNOSIS — M545 Low back pain, unspecified: Secondary | ICD-10-CM | POA: Insufficient documentation

## 2011-02-19 DIAGNOSIS — Z87898 Personal history of other specified conditions: Secondary | ICD-10-CM | POA: Insufficient documentation

## 2011-02-19 MED ORDER — GADOBENATE DIMEGLUMINE 529 MG/ML IV SOLN
15.0000 mL | Freq: Once | INTRAVENOUS | Status: AC | PRN
  Administered 2011-02-19: 15 mL via INTRAVENOUS

## 2011-05-09 ENCOUNTER — Other Ambulatory Visit: Payer: Self-pay | Admitting: Internal Medicine

## 2011-05-09 ENCOUNTER — Encounter (HOSPITAL_BASED_OUTPATIENT_CLINIC_OR_DEPARTMENT_OTHER): Payer: Medicare Other | Admitting: Internal Medicine

## 2011-05-09 DIAGNOSIS — C9000 Multiple myeloma not having achieved remission: Secondary | ICD-10-CM

## 2011-05-09 LAB — CBC WITH DIFFERENTIAL/PLATELET
Basophils Absolute: 0 10*3/uL (ref 0.0–0.1)
Eosinophils Absolute: 0.2 10*3/uL (ref 0.0–0.5)
HCT: 35.7 % (ref 34.8–46.6)
LYMPH%: 39.2 % (ref 14.0–49.7)
MCV: 94.2 fL (ref 79.5–101.0)
MONO#: 0.7 10*3/uL (ref 0.1–0.9)
MONO%: 9.2 % (ref 0.0–14.0)
NEUT#: 3.4 10*3/uL (ref 1.5–6.5)
NEUT%: 48.3 % (ref 38.4–76.8)
Platelets: 141 10*3/uL — ABNORMAL LOW (ref 145–400)
WBC: 7.1 10*3/uL (ref 3.9–10.3)

## 2011-05-10 LAB — COMPREHENSIVE METABOLIC PANEL
ALT: 24 U/L (ref 0–35)
CO2: 21 mEq/L (ref 19–32)
Calcium: 8.9 mg/dL (ref 8.4–10.5)
Chloride: 108 mEq/L (ref 96–112)
Creatinine, Ser: 0.63 mg/dL (ref 0.50–1.10)
Total Protein: 7 g/dL (ref 6.0–8.3)

## 2011-05-10 LAB — KAPPA/LAMBDA LIGHT CHAINS: Kappa:Lambda Ratio: 0.52 (ref 0.26–1.65)

## 2011-05-10 LAB — LACTATE DEHYDROGENASE: LDH: 253 U/L — ABNORMAL HIGH (ref 94–250)

## 2011-05-15 ENCOUNTER — Encounter (HOSPITAL_BASED_OUTPATIENT_CLINIC_OR_DEPARTMENT_OTHER): Payer: Medicare Other | Admitting: Internal Medicine

## 2011-05-15 DIAGNOSIS — C9000 Multiple myeloma not having achieved remission: Secondary | ICD-10-CM

## 2011-07-13 LAB — DIFFERENTIAL
Lymphs Abs: 0.6 — ABNORMAL LOW
Neutrophils Relative %: 64

## 2011-07-13 LAB — BASIC METABOLIC PANEL
BUN: 4 — ABNORMAL LOW
Calcium: 8 — ABNORMAL LOW
Chloride: 103
Creatinine, Ser: 0.54
GFR calc Af Amer: >60
GFR calc non Af Amer: >60

## 2011-07-13 LAB — URINALYSIS, ROUTINE W REFLEX MICROSCOPIC
Ketones, ur: NEGATIVE
Nitrite: NEGATIVE
Protein, ur: NEGATIVE
Urobilinogen, UA: 0.2

## 2011-07-13 LAB — CBC
MCV: 90.5
Platelets: 203
RDW: 15.6 — ABNORMAL HIGH
WBC: 4.6

## 2011-07-17 LAB — DIFFERENTIAL
Basophils Relative: 1
Eosinophils Absolute: 0.2
Lymphs Abs: 0.5 — ABNORMAL LOW
Monocytes Absolute: 1.3 — ABNORMAL HIGH
Neutro Abs: 5.7

## 2011-07-17 LAB — CBC
HCT: 33.4 — ABNORMAL LOW
Hemoglobin: 11.4 — ABNORMAL LOW
MCHC: 34.2
MCV: 90.5
RDW: 17.9 — ABNORMAL HIGH

## 2011-07-17 LAB — TISSUE HYBRIDIZATION (BONE MARROW)-NCBH

## 2011-08-01 LAB — CBC
HCT: 32.1 — ABNORMAL LOW
Hemoglobin: 10.8 — ABNORMAL LOW
MCHC: 33.8
MCV: 95.1
RDW: 14

## 2011-08-17 ENCOUNTER — Other Ambulatory Visit: Payer: Self-pay | Admitting: Internal Medicine

## 2011-08-17 ENCOUNTER — Encounter (HOSPITAL_BASED_OUTPATIENT_CLINIC_OR_DEPARTMENT_OTHER): Payer: Medicare Other | Admitting: Internal Medicine

## 2011-08-17 DIAGNOSIS — C9000 Multiple myeloma not having achieved remission: Secondary | ICD-10-CM

## 2011-08-17 LAB — BASIC METABOLIC PANEL
BUN: 9 mg/dL (ref 6–23)
CO2: 26 mEq/L (ref 19–32)
Calcium: 8.8 mg/dL (ref 8.4–10.5)
Glucose, Bld: 101 mg/dL — ABNORMAL HIGH (ref 70–99)
Sodium: 141 mEq/L (ref 135–145)

## 2011-10-18 ENCOUNTER — Telehealth: Payer: Self-pay | Admitting: Internal Medicine

## 2011-10-18 ENCOUNTER — Encounter: Payer: Self-pay | Admitting: Internal Medicine

## 2011-10-18 NOTE — Telephone Encounter (Signed)
I returned pts call . She is having abdominal pain x 3 weeks " sharp" She told me she saw her primary care doctor and he said to see Dr Donnald Garre. I told the pt that she is due for an appointment in Jan and that she will get a call about it and in the meantime to go to Urgent care or thh emergency room for her symptoms. She voices understanding.

## 2011-10-19 ENCOUNTER — Telehealth: Payer: Self-pay | Admitting: Internal Medicine

## 2011-10-19 NOTE — Telephone Encounter (Signed)
Talked to pt pt , gave her appt date for end of January 2013

## 2011-11-15 ENCOUNTER — Other Ambulatory Visit: Payer: Self-pay | Admitting: Internal Medicine

## 2011-11-16 ENCOUNTER — Other Ambulatory Visit (HOSPITAL_BASED_OUTPATIENT_CLINIC_OR_DEPARTMENT_OTHER): Payer: Medicare Other | Admitting: Lab

## 2011-11-16 DIAGNOSIS — C9000 Multiple myeloma not having achieved remission: Secondary | ICD-10-CM

## 2011-11-16 LAB — CBC WITH DIFFERENTIAL/PLATELET
Basophils Absolute: 0 10*3/uL (ref 0.0–0.1)
EOS%: 1.1 % (ref 0.0–7.0)
HGB: 13.1 g/dL (ref 11.6–15.9)
MCH: 30.6 pg (ref 25.1–34.0)
MCV: 92.2 fL (ref 79.5–101.0)
MONO%: 9.1 % (ref 0.0–14.0)
NEUT%: 67.8 % (ref 38.4–76.8)
RDW: 15.6 % — ABNORMAL HIGH (ref 11.2–14.5)

## 2011-11-18 ENCOUNTER — Other Ambulatory Visit: Payer: Self-pay | Admitting: Internal Medicine

## 2011-11-18 DIAGNOSIS — C9001 Multiple myeloma in remission: Secondary | ICD-10-CM

## 2011-11-19 ENCOUNTER — Ambulatory Visit: Payer: Medicare Other

## 2011-11-19 ENCOUNTER — Ambulatory Visit: Payer: Medicare Other | Admitting: Internal Medicine

## 2011-11-19 LAB — COMPREHENSIVE METABOLIC PANEL
AST: 26 U/L (ref 0–37)
Alkaline Phosphatase: 97 U/L (ref 39–117)
BUN: 16 mg/dL (ref 6–23)
Creatinine, Ser: 0.9 mg/dL (ref 0.50–1.10)
Potassium: 3.6 mEq/L (ref 3.5–5.3)

## 2011-11-19 LAB — IGG, IGA, IGM
IgA: 223 mg/dL (ref 69–380)
IgM, Serum: 161 mg/dL (ref 52–322)

## 2011-11-19 LAB — KAPPA/LAMBDA LIGHT CHAINS
Kappa:Lambda Ratio: 0.98 (ref 0.26–1.65)
Lambda Free Lght Chn: 1.69 mg/dL (ref 0.57–2.63)

## 2011-12-04 ENCOUNTER — Ambulatory Visit (HOSPITAL_BASED_OUTPATIENT_CLINIC_OR_DEPARTMENT_OTHER): Payer: Medicare Other | Admitting: Physician Assistant

## 2011-12-04 ENCOUNTER — Ambulatory Visit (HOSPITAL_BASED_OUTPATIENT_CLINIC_OR_DEPARTMENT_OTHER): Payer: Medicare Other

## 2011-12-04 ENCOUNTER — Other Ambulatory Visit: Payer: Medicare Other | Admitting: Lab

## 2011-12-04 VITALS — BP 151/92 | HR 82 | Temp 97.0°F | Wt 168.8 lb

## 2011-12-04 DIAGNOSIS — C9001 Multiple myeloma in remission: Secondary | ICD-10-CM

## 2011-12-04 MED ORDER — ZOLEDRONIC ACID 4 MG/100ML IV SOLN
4.0000 mg | Freq: Once | INTRAVENOUS | Status: AC
  Administered 2011-12-04: 4 mg via INTRAVENOUS
  Filled 2011-12-04: qty 100

## 2011-12-04 MED ORDER — SODIUM CHLORIDE 0.9 % IV SOLN: Freq: Once | INTRAVENOUS | Status: DC

## 2011-12-06 NOTE — Progress Notes (Signed)
No images are attached to the encounter. No scans are attached to the encounter. No scans are attached to the encounter. [facility] OFFICE PROGRESS NOTE  No primary provider on file. No primary provider on file.  DIAGNOSIS: Multiple myeloma, IgG kappa subtype diagnosed in October 2008  PRIOR THERAPY: 1. Status post radiotherapy to the lower lumbar spine and left pelvic area under the care of Dr. Roselind Messier. The patient received a total dose of 3000 cGy. in 15 fractions between 10/14/2007 through 11/07/2007 2.Status post 6 cycles of systemic chemotherapy with Revlimid and Decadron. Last dose given April 2009. 3. status post autologous peripheral blood stem cell transplant on 04/01/2008 under the care of Dr. Vicente Serene at [facility]  CURRENT THERAPY: Zometa 4 mg IV given every 3 months  INTERVAL HISTORY: Ruth Sanchez 66 y.o. female returns for a scheduled regular off visit for followup of her multiple myeloma. Overall she continues to do well although she continues to have back pain. Back pain is low back pain radiating down her left leg. This is being treated with oxycodone and periodic muscle relaxants. She voiced no other complaints today.   MEDICAL HISTORY: 1. Hypertension 2. Status post appendectomy 3. Status post cholecystectomy 4. Status post tubal ligation  ALLERGIES:   has no known allergies.  MEDICATIONS:  Current Outpatient Prescriptions  Medication Sig Dispense Refill  . carisoprodol (SOMA) 350 MG tablet Take 350 mg by mouth 2 (two) times daily.      . diphenoxylate-atropine (LOMOTIL) 2.5-0.025 MG per tablet Take 1 tablet by mouth 4 (four) times daily as needed.      Marland Kitchen METOPROLOL TARTRATE PO Take 25 mg by mouth 2 (two) times daily.      Marland Kitchen oxyCODONE-acetaminophen (PERCOCET) 5-325 MG per tablet Take 1 tablet by mouth every 6 (six) hours as needed.        SURGICAL HISTORY:  Appendectomy, cholecystectomy, tubal ligation  REVIEW OF SYSTEMS:   Musculoskeletal:positive for back pain   PHYSICAL EXAMINATION: General appearance: alert, cooperative, appears stated age and no distress Head: Normocephalic, without obvious abnormality, atraumatic Neck: no adenopathy, no carotid bruit, no JVD, supple, symmetrical, trachea midline and thyroid not enlarged, symmetric, no tenderness/mass/nodules Lymph nodes: Cervical, supraclavicular, and axillary nodes normal. Resp: clear to auscultation bilaterally Back: symmetric, no curvature. ROM normal. No CVA tenderness. Cardio: regular rate and rhythm, S1, S2 normal, no murmur, click, rub or gallop GI: soft, non-tender; bowel sounds normal; no masses,  no organomegaly and obese Extremities: extremities normal, atraumatic, no cyanosis or edema Neurologic: Alert and oriented X 3, normal strength and tone. Normal symmetric reflexes. Normal coordination and gait  ECOG PERFORMANCE STATUS: 1 - Symptomatic but completely ambulatory  Blood pressure 151/92, pulse 82, temperature 97 F (36.1 C), temperature source Oral, weight 168 lb 12.8 oz (76.567 kg).  LABORATORY DATA: Lab Results  Component Value Date   WBC 10.7* 11/16/2011   HGB 13.1 11/16/2011   HCT 39.6 11/16/2011   MCV 92.2 11/16/2011   PLT 167 11/16/2011      Chemistry      Component Value Date/Time   NA 139 11/16/2011 1204   K 3.6 11/16/2011 1204   CL 99 11/16/2011 1204   CO2 31 11/16/2011 1204   BUN 16 11/16/2011 1204   CREATININE 0.90 11/16/2011 1204      Component Value Date/Time   CALCIUM 9.2 11/16/2011 1204   ALKPHOS 97 11/16/2011 1204   AST 26 11/16/2011 1204   ALT 21 11/16/2011 1204  BILITOT 0.7 11/16/2011 1204       RADIOGRAPHIC STUDIES:  No results found.   ASSESSMENT/PLAN: The patient is a very pleasant 66 year old [place] American female with a history of multiple myeloma diagnosed in October 2008 status post radiation followed by chemotherapy with Revlimid Decadron as well as stem cell transplant in June of 2009 the patient  was discussed with Dr. Arbutus Ped. She will remain on observation and continue with her Zometa infusions every 3 months. She'll followup with Dr. Arbutus Ped in 6 months with repeat CBC differential, C. met LDH, quantitative immunoglobulin, beta 2 microglobulin and serum light chains drawn one week prior to that followup appointment to reevaluate her disease.     Ruth Sanchez, Leandra Vanderweele E, PA-C     All questions were answered. The patient knows to call the clinic with any problems, questions or concerns. We can certainly see the patient much sooner if necessary.

## 2012-01-01 ENCOUNTER — Other Ambulatory Visit (HOSPITAL_BASED_OUTPATIENT_CLINIC_OR_DEPARTMENT_OTHER): Payer: Medicare Other | Admitting: Lab

## 2012-01-01 DIAGNOSIS — C9001 Multiple myeloma in remission: Secondary | ICD-10-CM

## 2012-01-01 LAB — BASIC METABOLIC PANEL
CO2: 28 mEq/L (ref 19–32)
Calcium: 9.8 mg/dL (ref 8.4–10.5)
Chloride: 104 mEq/L (ref 96–112)
Creatinine, Ser: 0.88 mg/dL (ref 0.50–1.10)
Sodium: 140 mEq/L (ref 135–145)

## 2012-01-01 NOTE — Progress Notes (Signed)
Quick Note:  Call patient with the result and give dietary guidance with K rich diet ______

## 2012-01-02 ENCOUNTER — Telehealth: Payer: Self-pay | Admitting: Medical Oncology

## 2012-01-02 NOTE — Telephone Encounter (Signed)
Called pt to increase potassium in diet. I mailed her a list of pot rich foods at her request.

## 2012-01-02 NOTE — Telephone Encounter (Signed)
Message copied by Charma Igo on Wed Jan 02, 2012  6:35 PM ------      Message from: Si Gaul      Created: Tue Jan 01, 2012  5:17 PM       Call patient with the result and give dietary guidance with K rich diet

## 2012-01-28 ENCOUNTER — Telehealth: Payer: Self-pay | Admitting: Internal Medicine

## 2012-01-28 NOTE — Telephone Encounter (Signed)
pt called to r/s 4/9 lab, she has the stomach flu and r/s t o 4/15

## 2012-01-29 ENCOUNTER — Other Ambulatory Visit: Payer: Medicare Other | Admitting: Lab

## 2012-02-05 ENCOUNTER — Other Ambulatory Visit (HOSPITAL_BASED_OUTPATIENT_CLINIC_OR_DEPARTMENT_OTHER): Payer: Medicare Other | Admitting: Lab

## 2012-02-05 DIAGNOSIS — C9001 Multiple myeloma in remission: Secondary | ICD-10-CM

## 2012-02-05 LAB — BASIC METABOLIC PANEL
CO2: 29 mEq/L (ref 19–32)
Chloride: 107 mEq/L (ref 96–112)
Potassium: 3.6 mEq/L (ref 3.5–5.3)

## 2012-02-27 ENCOUNTER — Telehealth: Payer: Self-pay | Admitting: Medical Oncology

## 2012-02-27 NOTE — Telephone Encounter (Signed)
She is not having any watery diarrhea, drinking gatorade. I told her to taht Dr Donnald Garre wants her to call her primary care provider if symptoms worsen . She voices understanding

## 2012-02-27 NOTE — Telephone Encounter (Signed)
Diarrhea started today and has had 3 watery stools  today -started Lomotil.I told her to call me back in 2 hours to see how she is doing.

## 2012-02-29 ENCOUNTER — Ambulatory Visit: Payer: Medicare Other

## 2012-02-29 ENCOUNTER — Other Ambulatory Visit (HOSPITAL_BASED_OUTPATIENT_CLINIC_OR_DEPARTMENT_OTHER): Payer: Medicare Other | Admitting: Lab

## 2012-02-29 DIAGNOSIS — C9001 Multiple myeloma in remission: Secondary | ICD-10-CM

## 2012-02-29 LAB — BASIC METABOLIC PANEL
BUN: 15 mg/dL (ref 6–23)
Chloride: 106 mEq/L (ref 96–112)
Creatinine, Ser: 0.64 mg/dL (ref 0.50–1.10)
Glucose, Bld: 85 mg/dL (ref 70–99)
Potassium: 3.6 mEq/L (ref 3.5–5.3)

## 2012-02-29 NOTE — Progress Notes (Signed)
Pt presents early for lab and tells me she saw her primary MD today who prescribed medicine for her diarrhea. She want to postpone her zometa to next week.I told her to get her labs today and someone will call her back with a new appointment for next week zometa. She voices understanding.

## 2012-03-03 ENCOUNTER — Telehealth: Payer: Self-pay | Admitting: Internal Medicine

## 2012-03-03 NOTE — Telephone Encounter (Signed)
R/s 5/10 inf to 5/17 per 5/10 pof from infusion nurse. S/w pt she is aware. Other appts remain the same.

## 2012-03-06 ENCOUNTER — Telehealth: Payer: Self-pay | Admitting: Internal Medicine

## 2012-03-06 NOTE — Telephone Encounter (Signed)
pt lm to verify appt,called pt and she is aware   aom

## 2012-03-07 ENCOUNTER — Ambulatory Visit (HOSPITAL_BASED_OUTPATIENT_CLINIC_OR_DEPARTMENT_OTHER): Payer: Medicare Other

## 2012-03-07 VITALS — BP 166/92 | HR 73 | Temp 98.7°F

## 2012-03-07 DIAGNOSIS — C9001 Multiple myeloma in remission: Secondary | ICD-10-CM

## 2012-03-07 MED ORDER — SODIUM CHLORIDE 0.9 % IV SOLN
Freq: Once | INTRAVENOUS | Status: AC
  Administered 2012-03-07: 15:00:00 via INTRAVENOUS

## 2012-03-07 MED ORDER — ZOLEDRONIC ACID 4 MG/100ML IV SOLN
4.0000 mg | Freq: Once | INTRAVENOUS | Status: AC
  Administered 2012-03-07: 4 mg via INTRAVENOUS
  Filled 2012-03-07: qty 100

## 2012-03-07 NOTE — Patient Instructions (Signed)
[facility] Discharge Instructions for Patients Receiving Chemotherapy  Today you received the following chemotherapy agents ZOMETA  To help prevent nausea and vomiting after your treatment, we encourage you to take your nausea medication as directed by your provider.  If you develop nausea and vomiting that is not controlled by your nausea medication, call the clinic. If it is after clinic hours your family physician or the after hours number for the clinic or go to the Emergency Department.   BELOW ARE SYMPTOMS THAT SHOULD BE REPORTED IMMEDIATELY:  *FEVER GREATER THAN 100.5 F  *CHILLS WITH OR WITHOUT FEVER  NAUSEA AND VOMITING THAT IS NOT CONTROLLED WITH YOUR NAUSEA MEDICATION  *UNUSUAL SHORTNESS OF BREATH  *UNUSUAL BRUISING OR BLEEDING  TENDERNESS IN MOUTH AND THROAT WITH OR WITHOUT PRESENCE OF ULCERS  *URINARY PROBLEMS  *BOWEL PROBLEMS  UNUSUAL RASH Items with * indicate a potential emergency and should be followed up as soon as possible.  One of the nurses will contact you 24 hours after your treatment. Please let the nurse know about any problems that you may have experienced. Feel free to call the clinic you have any questions or concerns. The clinic phone number is [phone].   I have been informed and understand all the instructions given to me. I know to contact the clinic, my physician, or go to the Emergency Department if any problems should occur. I do not have any questions at this time, but understand that I may call the clinic during office hours   should I have any questions or need assistance in obtaining follow up care.    __________________________________________  _____________  __________ Signature of Patient or Authorized Representative            Date                   Time    __________________________________________ Nurse's Signature

## 2012-03-25 ENCOUNTER — Other Ambulatory Visit (HOSPITAL_BASED_OUTPATIENT_CLINIC_OR_DEPARTMENT_OTHER): Payer: Medicare Other | Admitting: Lab

## 2012-03-25 DIAGNOSIS — C9001 Multiple myeloma in remission: Secondary | ICD-10-CM

## 2012-03-25 LAB — BASIC METABOLIC PANEL
BUN: 14 mg/dL (ref 6–23)
Calcium: 9.5 mg/dL (ref 8.4–10.5)
Creatinine, Ser: 0.67 mg/dL (ref 0.50–1.10)
Glucose, Bld: 95 mg/dL (ref 70–99)

## 2012-04-22 ENCOUNTER — Other Ambulatory Visit: Payer: Medicare Other | Admitting: Lab

## 2012-06-02 ENCOUNTER — Other Ambulatory Visit: Payer: Medicare Other | Admitting: Lab

## 2012-06-02 ENCOUNTER — Ambulatory Visit: Payer: Medicare Other

## 2012-06-02 ENCOUNTER — Ambulatory Visit: Payer: Medicare Other | Admitting: Internal Medicine

## 2012-06-02 ENCOUNTER — Telehealth: Payer: Self-pay | Admitting: Medical Oncology

## 2012-06-02 NOTE — Telephone Encounter (Signed)
Started vomiting this am x 3 , cancelled appointment today with Dr Donnald Garre. I told her to call her PCP -she said she would .

## 2012-10-20 ENCOUNTER — Emergency Department (HOSPITAL_COMMUNITY): Payer: Medicare Other

## 2012-10-20 ENCOUNTER — Encounter (HOSPITAL_COMMUNITY): Payer: Self-pay | Admitting: Emergency Medicine

## 2012-10-20 ENCOUNTER — Telehealth: Payer: Self-pay | Admitting: *Deleted

## 2012-10-20 ENCOUNTER — Emergency Department (HOSPITAL_COMMUNITY)
Admission: EM | Admit: 2012-10-20 | Discharge: 2012-10-20 | Disposition: A | Discharge status: Home / Self Care | Payer: Medicare Other | Attending: Emergency Medicine | Admitting: Emergency Medicine

## 2012-10-20 ENCOUNTER — Telehealth: Payer: Self-pay | Admitting: Internal Medicine

## 2012-10-20 DIAGNOSIS — Z79899 Other long term (current) drug therapy: Secondary | ICD-10-CM | POA: Insufficient documentation

## 2012-10-20 DIAGNOSIS — J45909 Unspecified asthma, uncomplicated: Secondary | ICD-10-CM | POA: Insufficient documentation

## 2012-10-20 DIAGNOSIS — I10 Essential (primary) hypertension: Secondary | ICD-10-CM | POA: Insufficient documentation

## 2012-10-20 DIAGNOSIS — Z85830 Personal history of malignant neoplasm of bone: Secondary | ICD-10-CM | POA: Insufficient documentation

## 2012-10-20 DIAGNOSIS — N390 Urinary tract infection, site not specified: Secondary | ICD-10-CM | POA: Insufficient documentation

## 2012-10-20 HISTORY — DX: Essential (primary) hypertension: I10

## 2012-10-20 HISTORY — DX: Unspecified asthma, uncomplicated: J45.909

## 2012-10-20 HISTORY — DX: Malignant (primary) neoplasm, unspecified: C80.1

## 2012-10-20 LAB — URINALYSIS, ROUTINE W REFLEX MICROSCOPIC
Bilirubin Urine: NEGATIVE
Ketones, ur: NEGATIVE mg/dL
Nitrite: NEGATIVE
Protein, ur: NEGATIVE mg/dL
pH: 6.5 (ref 5.0–8.0)

## 2012-10-20 LAB — CBC WITH DIFFERENTIAL/PLATELET
Basophils Relative: 0 % (ref 0–1)
HCT: 38.2 % (ref 36.0–46.0)
Hemoglobin: 12.7 g/dL (ref 12.0–15.0)
Lymphocytes Relative: 41 % (ref 12–46)
Lymphs Abs: 4.1 10*3/uL — ABNORMAL HIGH (ref 0.7–4.0)
Monocytes Absolute: 0.8 10*3/uL (ref 0.1–1.0)
Monocytes Relative: 8 % (ref 3–12)
Neutro Abs: 5 10*3/uL (ref 1.7–7.7)
Neutrophils Relative %: 50 % (ref 43–77)
RBC: 4.19 MIL/uL (ref 3.87–5.11)

## 2012-10-20 LAB — COMPREHENSIVE METABOLIC PANEL
Albumin: 3.5 g/dL (ref 3.5–5.2)
Alkaline Phosphatase: 118 U/L — ABNORMAL HIGH (ref 39–117)
BUN: 15 mg/dL (ref 6–23)
CO2: 27 mEq/L (ref 19–32)
Chloride: 102 mEq/L (ref 96–112)
Creatinine, Ser: 0.59 mg/dL (ref 0.50–1.10)
GFR calc non Af Amer: >90 mL/min (ref >90)
Glucose, Bld: 115 mg/dL — ABNORMAL HIGH (ref 70–99)
Potassium: 3.4 mEq/L — ABNORMAL LOW (ref 3.5–5.1)
Total Bilirubin: 0.4 mg/dL (ref 0.3–1.2)

## 2012-10-20 LAB — URINE MICROSCOPIC-ADD ON

## 2012-10-20 LAB — LIPASE, BLOOD: Lipase: 34 U/L (ref 11–59)

## 2012-10-20 MED ORDER — SULFAMETHOXAZOLE-TRIMETHOPRIM 800-160 MG PO TABS: 1.0000 | ORAL_TABLET | Freq: Two times a day (BID) | ORAL | Status: DC

## 2012-10-20 MED ORDER — SULFAMETHOXAZOLE-TMP DS 800-160 MG PO TABS
1.0000 | ORAL_TABLET | Freq: Once | ORAL | Status: AC
  Administered 2012-10-20: 1 via ORAL
  Filled 2012-10-20: qty 1

## 2012-10-20 MED ORDER — SODIUM CHLORIDE 0.9 % IV SOLN
1000.0000 mL | Freq: Once | INTRAVENOUS | Status: AC
  Administered 2012-10-20: 1000 mL via INTRAVENOUS

## 2012-10-20 MED ORDER — HYDROMORPHONE HCL PF 1 MG/ML IJ SOLN
0.5000 mg | Freq: Once | INTRAMUSCULAR | Status: AC
  Administered 2012-10-20: 0.5 mg via INTRAVENOUS
  Filled 2012-10-20: qty 1

## 2012-10-20 MED ORDER — OXYCODONE-ACETAMINOPHEN 10-325 MG PO TABS: 1.0000 | ORAL_TABLET | Freq: Four times a day (QID) | ORAL | Status: DC | PRN

## 2012-10-20 MED ORDER — ONDANSETRON HCL 4 MG/2ML IJ SOLN
4.0000 mg | Freq: Once | INTRAMUSCULAR | Status: AC
  Administered 2012-10-20: 4 mg via INTRAVENOUS
  Filled 2012-10-20: qty 2

## 2012-10-20 NOTE — ED Notes (Addendum)
Pt c/o 7/10 left sided flank pain that feels like she is being stabbed with an ice pick. Pt feels the pain is the exact same as when she had cancer. Pt c/o that she has been unable to sleep. Pt denies urinary frequency, dysuria, increased frequency, N/V/D. Pt reports that she has a history of "bone cancer" in 2004 and was treated with bone marrow transplant, chemotherapy, and is scheduled for a cancer recheck with Dr. Shirline Frees for a recheck tomorrow.

## 2012-10-20 NOTE — Telephone Encounter (Signed)
Per staff phone call I have added appt for tomorrow. JMW

## 2012-10-20 NOTE — ED Provider Notes (Signed)
History     CSN: [id number]  Arrival date & time 10/20/12  1257   First MD Initiated Contact with Patient 10/20/12 1630      Chief Complaint  Patient presents with  . Flank Pain    Left    (Consider location/radiation/quality/duration/timing/severity/associated sxs/prior treatment) HPI  The patient presents with concerns of flank pain.  The pain began insidiously approximately 10 days ago.  Since onset the pain has been persistent, sharp, nonradiating, focally about the left posterior lateral inferior axillary area.  The pain is worse with motion or palpation.  The pain is improved with home narcotic use, though the patient ran out of this medication yesterday. She denies concurrent fevers, chills, dysuria, hematuria.  She does endorse mild nausea, but denies vomiting or diarrhea. The patient has a notable history of multiple myeloma, in remission, for 8 years.  Past Medical History  Diagnosis Date  . Cancer   . Hypertension   . Asthma     Past Surgical History  Procedure Date  . Appendectomy   . Tubal ligation   . Cholecystectomy     No family history on file.  History  Substance Use Topics  . Smoking status: Never Smoker   . Smokeless tobacco: Never Used  . Alcohol Use: No    OB History    Grav Para Term Preterm Abortions TAB SAB Ect Mult Living                  Review of Systems  Constitutional:       Per HPI, otherwise negative  HENT:       Per HPI, otherwise negative  Eyes: Negative.   Respiratory:       Per HPI, otherwise negative  Cardiovascular:       Per HPI, otherwise negative  Gastrointestinal: Negative for vomiting.  Genitourinary: Positive for flank pain.  Musculoskeletal:       Per HPI, otherwise negative  Skin: Negative.   Neurological: Negative for syncope.    Allergies  Review of patient's allergies indicates no known allergies.  Home Medications   Current Outpatient Rx  Name  Route  Sig  Dispense  Refill  . ALBUTEROL  SULFATE HFA 108 (90 BASE) MCG/ACT IN AERS   Inhalation   Inhale 2 puffs into the lungs every 6 (six) hours as needed. For shortness of breath.         Marland Kitchen CARISOPRODOL 350 MG PO TABS   Oral   Take 175 mg by mouth 4 (four) times daily as needed. For muscle pain.         Marland Kitchen METOPROLOL TARTRATE 25 MG PO TABS   Oral   Take 25 mg by mouth 2 (two) times daily.         Marland Kitchen NAPROXEN SODIUM 220 MG PO TABS   Oral   Take 440 mg by mouth 2 (two) times daily as needed. For pain.         . OXYCODONE-ACETAMINOPHEN 10-325 MG PO TABS   Oral   Take 1 tablet by mouth every 6 (six) hours as needed. For pain.           BP 180/114  Pulse 77  Temp 99.3 F (37.4 C) (Oral)  Resp 15  SpO2 96%  Physical Exam  Nursing note and vitals reviewed. Constitutional: She is oriented to person, place, and time. She appears well-developed and well-nourished. No distress.  HENT:  Head: Normocephalic and atraumatic.  Eyes: Conjunctivae normal and EOM  are normal.  Cardiovascular: Normal rate and regular rhythm.   Pulmonary/Chest: Effort normal and breath sounds normal. No stridor. No respiratory distress.  Abdominal: She exhibits no distension.         The anterior abdomen is soft, nontender, with good bowel sounds  Musculoskeletal: She exhibits no edema.  Neurological: She is alert and oriented to person, place, and time. No cranial nerve deficit.  Skin: Skin is warm and dry.  Psychiatric: She has a normal mood and affect.    ED Course  Procedures (including critical care time)   Labs Reviewed  CBC WITH DIFFERENTIAL  COMPREHENSIVE METABOLIC PANEL  LIPASE, BLOOD  URINALYSIS, ROUTINE W REFLEX MICROSCOPIC   No results found.   No diagnosis found.  7:21 PM Patient appears significantly more comfortable.  MDM  This elderly female presents with new flank pain.  On exam she is uncomfortable appearing, though in no distress.  The patient was moderately hypertensive.  Following provision of  analgesics, the patient is improved, ambulatory.  The patient's vital signs improved as well.  The patient's labs demonstrate evidence of possible urinary tract infection, though secondary chest was sent.  With her description of flank pain, the moderate leukocytes in her urine, treatment was initiated for UTI.  Absent fever, distress, she is appropriate for discharge.  I discussed return precautions, the need for PMD followup.   Gerhard Munch, MD 10/20/12 [phone]

## 2012-10-20 NOTE — Telephone Encounter (Signed)
Pt called stating she had missed her appts and was wanted to r/s them, pt is scared that her cancer is back and wants to be seen today. Informed her we can f/u with her tomorrow.  She states she is having a lot of pain in her hip and is very concerned.  Informed her that if her pain is not controlled she needs to go to the ED for evaluation.  Pt stated she will go to the ED.  SLJ

## 2012-10-20 NOTE — ED Notes (Signed)
MD at bedside. 

## 2012-10-20 NOTE — Telephone Encounter (Signed)
called pt back and she is aware of her appt 12/31

## 2012-10-20 NOTE — ED Notes (Signed)
Pt states that the EDP will be discharging her shortly.

## 2012-10-21 ENCOUNTER — Ambulatory Visit (HOSPITAL_BASED_OUTPATIENT_CLINIC_OR_DEPARTMENT_OTHER): Payer: Medicare Other | Admitting: Internal Medicine

## 2012-10-21 ENCOUNTER — Telehealth: Payer: Self-pay | Admitting: Internal Medicine

## 2012-10-21 ENCOUNTER — Ambulatory Visit (HOSPITAL_BASED_OUTPATIENT_CLINIC_OR_DEPARTMENT_OTHER): Payer: Medicare Other

## 2012-10-21 ENCOUNTER — Telehealth: Payer: Self-pay | Admitting: *Deleted

## 2012-10-21 ENCOUNTER — Other Ambulatory Visit (HOSPITAL_BASED_OUTPATIENT_CLINIC_OR_DEPARTMENT_OTHER): Payer: Medicare Other | Admitting: Lab

## 2012-10-21 ENCOUNTER — Encounter: Payer: Self-pay | Admitting: Internal Medicine

## 2012-10-21 VITALS — BP 137/87 | HR 78 | Temp 98.1°F | Resp 20 | Ht 59.0 in | Wt 166.8 lb

## 2012-10-21 DIAGNOSIS — C9001 Multiple myeloma in remission: Secondary | ICD-10-CM

## 2012-10-21 DIAGNOSIS — R109 Unspecified abdominal pain: Secondary | ICD-10-CM

## 2012-10-21 LAB — COMPREHENSIVE METABOLIC PANEL (CC13)
Albumin: 3.7 g/dL (ref 3.5–5.0)
Alkaline Phosphatase: 136 U/L (ref 40–150)
CO2: 28 mEq/L (ref 22–29)
Glucose: 75 mg/dl (ref 70–99)
Potassium: 3.8 mEq/L (ref 3.5–5.1)
Sodium: 139 mEq/L (ref 136–145)
Total Protein: 7.4 g/dL (ref 6.4–8.3)

## 2012-10-21 LAB — CBC WITH DIFFERENTIAL/PLATELET
BASO%: 0.3 % (ref 0.0–2.0)
LYMPH%: 36.3 % (ref 14.0–49.7)
MCHC: 33.4 g/dL (ref 31.5–36.0)
MCV: 94.7 fL (ref 79.5–101.0)
MONO%: 7.6 % (ref 0.0–14.0)
Platelets: 157 10*3/uL (ref 145–400)
RBC: 3.82 10*6/uL (ref 3.70–5.45)
WBC: 9.6 10*3/uL (ref 3.9–10.3)

## 2012-10-21 LAB — URINE CULTURE: Colony Count: 15000

## 2012-10-21 MED ORDER — ZOLEDRONIC ACID 4 MG/100ML IV SOLN
4.0000 mg | Freq: Once | INTRAVENOUS | Status: AC
  Administered 2012-10-21: 4 mg via INTRAVENOUS
  Filled 2012-10-21: qty 100

## 2012-10-21 MED ORDER — HEPARIN SOD (PORK) LOCK FLUSH 100 UNIT/ML IV SOLN
250.0000 [IU] | Freq: Once | INTRAVENOUS | Status: DC | PRN
  Filled 2012-10-21: qty 5

## 2012-10-21 MED ORDER — SODIUM CHLORIDE 0.9 % IV SOLN
Freq: Once | INTRAVENOUS | Status: AC
  Administered 2012-10-21: 15:00:00 via INTRAVENOUS

## 2012-10-21 NOTE — Telephone Encounter (Signed)
gv and printed appt schedule for March 2014....emailed michelle to add chemo..the patient aware

## 2012-10-21 NOTE — Progress Notes (Signed)
[facility] [phone]   [phone]  OFFICE PROGRESS NOTE  DIAGNOSIS: Multiple myeloma, IgG kappa subtype diagnosed in October 2008   PRIOR THERAPY:  1. Status post radiotherapy to the lower lumbar spine and left pelvic area under the care of Dr. Roselind Messier. The patient received a total dose of 3000 cGy. in 15 fractions between 10/14/2007 through 11/07/2007  2.Status post 6 cycles of systemic chemotherapy with Revlimid and Decadron. Last dose given April 2009.  3. status post autologous peripheral blood stem cell transplant on 04/01/2008 under the care of Dr. Vicente Serene at [facility].   CURRENT THERAPY: Zometa 4 mg IV given every 3 months  INTERVAL HISTORY: Ruth Sanchez 66 y.o. female returns to the clinic today for followup visit accompanied her husband. The patient is feeling fine today except for left flank pain. She was seen at the emergency Department yesterday and was started on treatment with Bactrim DS as well as Percocet. She was last seen in February of 2013 and she has been doing fine since that time. The patient missed a few appointments since her last visit. She had repeat CBC, comprehensive metabolic panel, LDH and myeloma panel performed earlier today and she is here for evaluation and discussion of her lab results.   MEDICAL HISTORY: Past Medical History  Diagnosis Date  . Cancer   . Hypertension   . Asthma     ALLERGIES:   has no known allergies.  MEDICATIONS:  Current Outpatient Prescriptions  Medication Sig Dispense Refill  . albuterol (PROVENTIL HFA;VENTOLIN HFA) 108 (90 BASE) MCG/ACT inhaler Inhale 2 puffs into the lungs every 6 (six) hours as needed. For shortness of breath.      . carisoprodol (SOMA) 350 MG tablet Take 175 mg by mouth 4 (four) times daily as needed. For muscle pain.      . metoprolol tartrate (LOPRESSOR) 25 MG tablet Take 25 mg by mouth 2 (two) times daily.      . naproxen sodium (ANAPROX) 220 MG  tablet Take 440 mg by mouth 2 (two) times daily as needed. For pain.      Marland Kitchen oxyCODONE-acetaminophen (PERCOCET) 10-325 MG per tablet Take 1 tablet by mouth every 6 (six) hours as needed. For pain.  30 tablet  0  . sulfamethoxazole-trimethoprim (SEPTRA DS) 800-160 MG per tablet Take 1 tablet by mouth 2 (two) times daily.  10 tablet  0   No current facility-administered medications for this visit.   Facility-Administered Medications Ordered in Other Visits  Medication Dose Route Frequency Provider Last Rate Last Dose  . heparin lock flush 100 unit/mL  250 Units Intracatheter Once PRN Si Gaul, MD        SURGICAL HISTORY:  Past Surgical History  Procedure Date  . Appendectomy   . Tubal ligation   . Cholecystectomy     REVIEW OF SYSTEMS:  A comprehensive review of systems was negative except for: Musculoskeletal: positive for Left flank pain   PHYSICAL EXAMINATION: General appearance: alert, cooperative and no distress Head: Normocephalic, without obvious abnormality, atraumatic Neck: no adenopathy Lymph nodes: Cervical, supraclavicular, and axillary nodes normal. Resp: clear to auscultation bilaterally Back: Tenderness in the left flank area to palpation Cardio: regular rate and rhythm, S1, S2 normal, no murmur, click, rub or gallop GI: soft, non-tender; bowel sounds normal; no masses,  no organomegaly Extremities: extremities normal, atraumatic, no cyanosis or edema  ECOG PERFORMANCE STATUS: 1 - Symptomatic but completely ambulatory  Blood  pressure 137/87, pulse 78, temperature 98.1 F (36.7 C), temperature source Oral, resp. rate 20, height 4\' 11"  (1.499 m), weight 166 lb 12.8 oz (75.66 kg).  LABORATORY DATA: Lab Results  Component Value Date   WBC 9.6 10/21/2012   HGB 12.1 10/21/2012   HCT 36.1 10/21/2012   MCV 94.7 10/21/2012   PLT 157 10/21/2012      Chemistry      Component Value Date/Time   NA 139 10/20/2012 1704   K 3.4* 10/20/2012 1704   CL 102  10/20/2012 1704   CO2 27 10/20/2012 1704   BUN 15 10/20/2012 1704   CREATININE 0.59 10/20/2012 1704      Component Value Date/Time   CALCIUM 9.0 10/20/2012 1704   ALKPHOS 118* 10/20/2012 1704   AST 18 10/20/2012 1704   ALT 13 10/20/2012 1704   BILITOT 0.4 10/20/2012 1704       RADIOGRAPHIC STUDIES: Dg Abd Acute W/chest  10/20/2012  *RADIOLOGY REPORT*  Clinical Data: Left-sided flank pain.  ACUTE ABDOMEN SERIES (ABDOMEN 2 VIEW & CHEST 1 VIEW)  Comparison: Chest film of 10/08/2009.  Findings: Frontal view of the chest demonstrates high-riding humeral heads bilaterally, suggesting chronic rotator cuff insufficiency.  Moderate cardiomegaly, accentuated by low lung volumes.  Right paratracheal soft tissue fullness is favored to be related to great vessels, given the stability back to the 2010 study.  No pleural effusion or pneumothorax.  Chronic interstitial thickening, accentuated by low lung volumes. No lobar consolidation.  Mild bibasilar scarring or atelectasis.  Abominal films demonstrate no free intraperitoneal air on upright positioning.  Moderate amount of colonic stool.  Cholecystectomy clips.  No small bowel dilatation.  No plain film evidence of urinary tract calculi.  A right pelvic calcification likely corresponds to an ovarian dermoid, when correlated with 12/12/2007 CT.  Degenerative changes of bilateral hips and the lumbar spine.  IMPRESSION: Possible constipation.  Cardiomegaly and chronic lung changes.   Original Report Authenticated By: Jeronimo Greaves, M.D.     ASSESSMENT: This is a very pleasant 66 years old African American female with history of multiple myeloma status post systemic chemotherapy with Revlimid and Decadron followed by autologous peripheral blood stem cell transplant and has been observation since June of 2009 with no evidence for disease recurrence. Her myeloma panel is still pending today.  PLAN: I recommended for the patient to continue on observation with  repeat myeloma panel in 6 months. The patient will continue on Zometa injection every 3 months. If the myeloma panel performed earlier today showed any significant abnormality I will call the patient with further recommendation. She was advised to call immediately if she has any concerning symptoms in the interval. For the recent urinary tract infection the patient will continue on Bactrim and Percocet All questions were answered. The patient knows to call the clinic with any problems, questions or concerns. We can certainly see the patient much sooner if necessary.

## 2012-10-21 NOTE — Progress Notes (Signed)
Upon entrance to infusion room, pt complains of tender area on the posterior surface of her forearm.  She states it is same location as IV site in ED last night.  Pt states that she told ED staff site was burning.  Pt has 2" long by 1" wide raised area of tenderness on left posterior forearm.  Slight redness, no clear signs of phlebitis.  Pt did not mention to Dr. Arbutus Ped at [facility] today.  Tiana Loft, PA aware, referred to pharmacy for treatment.   Per L. Icovelli, PharmD, warm pack applied.  Advised pt to apply heat at home, 15 minutes on, at least 30 minutes off.  Pt/husband verbalize understanding-dhp, rn

## 2012-10-21 NOTE — Telephone Encounter (Signed)
Per staff message and POF I have scheduled appts.  JMW  

## 2012-10-21 NOTE — Patient Instructions (Addendum)
Zoledronic Acid injection  What is this medicine? ZOLEDRONIC ACID (ZOE le dron ik AS id) lowers the amount of calcium loss from bone. It is used to treat too much calcium in your blood from cancer. It is also used to prevent complications of cancer that has spread to the bone. This medicine may be used for other purposes; ask your health care provider or pharmacist if you have questions. What should I tell my health care provider before I take this medicine? They need to know if you have any of these conditions: -aspirin-sensitive asthma -dental disease -kidney disease -an unusual or allergic reaction to zoledronic acid, other medicines, foods, dyes, or preservatives -pregnant or trying to get pregnant -breast-feeding How should I use this medicine? This medicine is for infusion into a vein. It is given by a health care professional in a hospital or clinic setting. Talk to your pediatrician regarding the use of this medicine in children. Special care may be needed. Overdosage: If you think you have taken too much of this medicine contact a poison control center or emergency room at once. NOTE: This medicine is only for you. Do not share this medicine with others. What if I miss a dose? It is important not to miss your dose. Call your doctor or health care professional if you are unable to keep an appointment. What may interact with this medicine? -certain antibiotics given by injection -NSAIDs, medicines for pain and inflammation, like ibuprofen or naproxen -some diuretics like bumetanide, furosemide -teriparatide -thalidomide This list may not describe all possible interactions. Give your health care provider a list of all the medicines, herbs, non-prescription drugs, or dietary supplements you use. Also tell them if you smoke, drink alcohol, or use illegal drugs. Some items may interact with your medicine. What should I watch for while using this medicine? Visit your doctor or health  care professional for regular checkups. It may be some time before you see the benefit from this medicine. Do not stop taking your medicine unless your doctor tells you to. Your doctor may order blood tests or other tests to see how you are doing. Women should inform their doctor if they wish to become pregnant or think they might be pregnant. There is a potential for serious side effects to an unborn child. Talk to your health care professional or pharmacist for more information. You should make sure that you get enough calcium and vitamin D while you are taking this medicine. Discuss the foods you eat and the vitamins you take with your health care professional. Some people who take this medicine have severe bone, joint, and/or muscle pain. This medicine may also increase your risk for a broken thigh bone. Tell your doctor right away if you have pain in your upper leg or groin. Tell your doctor if you have any pain that does not go away or that gets worse. What side effects may I notice from receiving this medicine? Side effects that you should report to your doctor or health care professional as soon as possible: -allergic reactions like skin rash, itching or hives, swelling of the face, lips, or tongue -anxiety, confusion, or depression -breathing problems -changes in vision -feeling faint or lightheaded, falls -jaw burning, cramping, pain -muscle cramps, stiffness, or weakness -trouble passing urine or change in the amount of urine Side effects that usually do not require medical attention (report to your doctor or health care professional if they continue or are bothersome): -bone, joint, or muscle pain -fever -  hair loss -irritation at site where injected -loss of appetite -nausea, vomiting -stomach upset -tired This list may not describe all possible side effects. Call your doctor for medical advice about side effects. You may report side effects to FDA at 1-800-FDA-1088. Where should  I keep my medicine? This drug is given in a hospital or clinic and will not be stored at home. NOTE: This sheet is a summary. It may not cover all possible information. If you have questions about this medicine, talk to your doctor, pharmacist, or health care provider.  2012, [employer] Standard. (04/06/2011 9:06:58 AM)

## 2012-10-21 NOTE — Patient Instructions (Signed)
Continue on observation for now. Zometa every 3 months

## 2012-10-23 LAB — BETA 2 MICROGLOBULIN, SERUM: Beta-2 Microglobulin: 1.84 mg/L — ABNORMAL HIGH (ref 1.01–1.73)

## 2012-10-27 LAB — SPEP & IFE WITH QIG
Albumin ELP: 52.2 % — ABNORMAL LOW (ref 55.8–66.1)
Alpha-1-Globulin: 3.9 % (ref 2.9–4.9)
Alpha-2-Globulin: 12.3 % — ABNORMAL HIGH (ref 7.1–11.8)
Total Protein, Serum Electrophoresis: 7.9 g/dL (ref 6.0–8.3)

## 2013-01-13 ENCOUNTER — Telehealth: Payer: Self-pay | Admitting: Medical Oncology

## 2013-01-13 ENCOUNTER — Other Ambulatory Visit: Payer: Medicare Other | Admitting: Lab

## 2013-01-13 ENCOUNTER — Ambulatory Visit: Payer: Medicare Other | Admitting: Physician Assistant

## 2013-01-13 ENCOUNTER — Ambulatory Visit: Payer: Medicare Other

## 2013-01-13 NOTE — Telephone Encounter (Signed)
Diarrhea started at 0500 x 3 . Feels too weak to come in today for zometa. She cancelled her appt today . I instructed her to call her PCP re: diarrhea.

## 2013-01-14 ENCOUNTER — Telehealth: Payer: Self-pay | Admitting: *Deleted

## 2013-01-14 ENCOUNTER — Telehealth: Payer: Self-pay | Admitting: Internal Medicine

## 2013-01-14 DIAGNOSIS — C9001 Multiple myeloma in remission: Secondary | ICD-10-CM

## 2013-01-14 NOTE — Telephone Encounter (Signed)
Per staff phone call and POF I have schedueld appts.  JMW  

## 2013-01-14 NOTE — Telephone Encounter (Signed)
Pt called stating that for the past 3 weeks her left hip has been hurting.  She said sometimes she has trouble walking on it. It is a sharp pain.  She states she has been to her PCP and they said "she should talk to Dr Donnald Garre".  Will leave note for Dr Donnald Garre to determine what to do.  SLJ

## 2013-01-15 ENCOUNTER — Other Ambulatory Visit: Payer: Self-pay | Admitting: *Deleted

## 2013-01-15 ENCOUNTER — Ambulatory Visit (HOSPITAL_COMMUNITY)
Admission: RE | Admit: 2013-01-15 | Discharge: 2013-01-15 | Disposition: A | Discharge status: Home / Self Care | Payer: Medicare Other | Source: Ambulatory Visit | Attending: Internal Medicine | Admitting: Internal Medicine

## 2013-01-15 ENCOUNTER — Telehealth: Payer: Self-pay | Admitting: Internal Medicine

## 2013-01-15 DIAGNOSIS — C9001 Multiple myeloma in remission: Secondary | ICD-10-CM | POA: Insufficient documentation

## 2013-01-15 DIAGNOSIS — M47817 Spondylosis without myelopathy or radiculopathy, lumbosacral region: Secondary | ICD-10-CM | POA: Insufficient documentation

## 2013-01-15 DIAGNOSIS — M25559 Pain in unspecified hip: Secondary | ICD-10-CM | POA: Insufficient documentation

## 2013-01-15 MED ORDER — OXYCODONE-ACETAMINOPHEN 10-325 MG PO TABS: 1.0000 | ORAL_TABLET | Freq: Four times a day (QID) | ORAL | Status: DC | PRN

## 2013-01-15 NOTE — Addendum Note (Signed)
Addended by: Caren Griffins on: 01/15/2013 11:14 AM   Modules accepted: Orders

## 2013-01-15 NOTE — Telephone Encounter (Signed)
, °

## 2013-01-15 NOTE — Telephone Encounter (Signed)
Per Dr Donnald Garre, pt needs x-ray of her left hip, and will give her a refill on her oxycodone and she will be reassessed 3/31 at her f/u with AJ.  Pt verbalized understanding,  SLJ

## 2013-01-19 ENCOUNTER — Encounter: Payer: Self-pay | Admitting: Physician Assistant

## 2013-01-19 ENCOUNTER — Other Ambulatory Visit (HOSPITAL_BASED_OUTPATIENT_CLINIC_OR_DEPARTMENT_OTHER): Payer: Medicare Other | Admitting: Lab

## 2013-01-19 ENCOUNTER — Ambulatory Visit (HOSPITAL_BASED_OUTPATIENT_CLINIC_OR_DEPARTMENT_OTHER): Payer: Medicare Other

## 2013-01-19 ENCOUNTER — Telehealth: Payer: Self-pay | Admitting: *Deleted

## 2013-01-19 ENCOUNTER — Telehealth: Payer: Self-pay | Admitting: Internal Medicine

## 2013-01-19 ENCOUNTER — Ambulatory Visit (HOSPITAL_BASED_OUTPATIENT_CLINIC_OR_DEPARTMENT_OTHER): Payer: Medicare Other | Admitting: Physician Assistant

## 2013-01-19 VITALS — BP 160/98 | HR 85 | Temp 98.3°F | Resp 18 | Ht 59.0 in | Wt 160.8 lb

## 2013-01-19 DIAGNOSIS — C9001 Multiple myeloma in remission: Secondary | ICD-10-CM

## 2013-01-19 DIAGNOSIS — Z9484 Stem cells transplant status: Secondary | ICD-10-CM

## 2013-01-19 LAB — COMPREHENSIVE METABOLIC PANEL (CC13)
ALT: 14 U/L (ref 0–55)
AST: 21 U/L (ref 5–34)
Alkaline Phosphatase: 96 U/L (ref 40–150)
Chloride: 104 mEq/L (ref 98–107)
Creatinine: 0.7 mg/dL (ref 0.6–1.1)
Total Bilirubin: 0.62 mg/dL (ref 0.20–1.20)

## 2013-01-19 LAB — CBC WITH DIFFERENTIAL/PLATELET
BASO%: 0.1 % (ref 0.0–2.0)
Basophils Absolute: 0 10*3/uL (ref 0.0–0.1)
EOS%: 1 % (ref 0.0–7.0)
Eosinophils Absolute: 0.1 10*3/uL (ref 0.0–0.5)
HCT: 37.8 % (ref 34.8–46.6)
HGB: 12.5 g/dL (ref 11.6–15.9)
LYMPH%: 33.2 % (ref 14.0–49.7)
MCH: 30.3 pg (ref 25.1–34.0)
MCHC: 33.1 g/dL (ref 31.5–36.0)
MCV: 91.7 fL (ref 79.5–101.0)
MONO#: 0.5 10*3/uL (ref 0.1–0.9)
MONO%: 7 % (ref 0.0–14.0)
NEUT#: 3.9 10*3/uL (ref 1.5–6.5)
NEUT%: 58.7 % (ref 38.4–76.8)
Platelets: 145 10*3/uL (ref 145–400)
RBC: 4.12 10*6/uL (ref 3.70–5.45)
RDW: 13.8 % (ref 11.2–14.5)
WBC: 6.7 10*3/uL (ref 3.9–10.3)
lymph#: 2.2 10*3/uL (ref 0.9–3.3)
nRBC: 0 % (ref 0–0)

## 2013-01-19 LAB — LACTATE DEHYDROGENASE (CC13): LDH: 254 U/L — ABNORMAL HIGH (ref 125–245)

## 2013-01-19 MED ORDER — ZOLEDRONIC ACID 4 MG/100ML IV SOLN
4.0000 mg | Freq: Once | INTRAVENOUS | Status: AC
  Administered 2013-01-19: 4 mg via INTRAVENOUS
  Filled 2013-01-19: qty 100

## 2013-01-19 MED ORDER — SODIUM CHLORIDE 0.9 % IV SOLN
Freq: Once | INTRAVENOUS | Status: AC
  Administered 2013-01-19: 13:00:00 via INTRAVENOUS

## 2013-01-19 MED ORDER — HEPARIN SOD (PORK) LOCK FLUSH 100 UNIT/ML IV SOLN
500.0000 [IU] | Freq: Once | INTRAVENOUS | Status: DC | PRN
  Filled 2013-01-19: qty 5

## 2013-01-19 NOTE — Patient Instructions (Addendum)
Zoledronic Acid injection (Paget's Disease, Osteoporosis) What is this medicine? ZOLEDRONIC ACID (ZOE le dron ik AS id) lowers the amount of calcium loss from bone. It is used to treat Paget's disease and osteoporosis in women. This medicine may be used for other purposes; ask your health care provider or pharmacist if you have questions. What should I tell my health care provider before I take this medicine? They need to know if you have any of these conditions: -aspirin-sensitive asthma -dental disease -kidney disease -low levels of calcium in the blood -past surgery on the parathyroid gland or intestines -an unusual or allergic reaction to zoledronic acid, other medicines, foods, dyes, or preservatives -pregnant or trying to get pregnant -breast-feeding How should I use this medicine? This medicine is for infusion into a vein. It is given by a health care professional in a hospital or clinic setting. Talk to your pediatrician regarding the use of this medicine in children. This medicine is not approved for use in children. Overdosage: If you think you have taken too much of this medicine contact a poison control center or emergency room at once. NOTE: This medicine is only for you. Do not share this medicine with others. What if I miss a dose? It is important not to miss your dose. Call your doctor or health care professional if you are unable to keep an appointment. What may interact with this medicine? -certain antibiotics given by injection -NSAIDs, medicines for pain and inflammation, like ibuprofen or naproxen -some diuretics like bumetanide, furosemide -teriparatide This list may not describe all possible interactions. Give your health care provider a list of all the medicines, herbs, non-prescription drugs, or dietary supplements you use. Also tell them if you smoke, drink alcohol, or use illegal drugs. Some items may interact with your medicine. What should I watch for while  using this medicine? Visit your doctor or health care professional for regular checkups. It may be some time before you see the benefit from this medicine. Do not stop taking your medicine unless your doctor tells you to. Your doctor may order blood tests or other tests to see how you are doing. Women should inform their doctor if they wish to become pregnant or think they might be pregnant. There is a potential for serious side effects to an unborn child. Talk to your health care professional or pharmacist for more information. You should make sure that you get enough calcium and vitamin D while you are taking this medicine. Discuss the foods you eat and the vitamins you take with your health care professional. Some people who take this medicine have severe bone, joint, and/or muscle pain. This medicine may also increase your risk for a broken thigh bone. Tell your doctor right away if you have pain in your upper leg or groin. Tell your doctor if you have any pain that does not go away or that gets worse. What side effects may I notice from receiving this medicine? Side effects that you should report to your doctor or health care professional as soon as possible: -allergic reactions like skin rash, itching or hives, swelling of the face, lips, or tongue -breathing problems -changes in vision -feeling faint or lightheaded, falls -jaw burning, cramping, or pain -muscle cramps, stiffness, or weakness -trouble passing urine or change in the amount of urine Side effects that usually do not require medical attention (report to your doctor or health care professional if they continue or are bothersome): -bone, joint, or muscle pain -fever -  irritation at site where injected -loss of appetite -nausea, vomiting -stomach upset -tired This list may not describe all possible side effects. Call your doctor for medical advice about side effects. You may report side effects to FDA at 1-800-FDA-1088. Where  should I keep my medicine? This drug is given in a hospital or clinic and will not be stored at home. NOTE: This sheet is a summary. It may not cover all possible information. If you have questions about this medicine, talk to your doctor, pharmacist, or health care provider.  2013, [employer]/Gold Standard. (04/06/2011 9:08:15 AM)  

## 2013-01-19 NOTE — Telephone Encounter (Signed)
Per staff message and POF I have scheduled appts.  JMW  

## 2013-01-19 NOTE — Progress Notes (Signed)
[facility] [phone]   Fax:([phone]  OFFICE PROGRESS NOTE  DIAGNOSIS: Multiple myeloma, IgG kappa subtype diagnosed in October 2008   PRIOR THERAPY:  1. Status post radiotherapy to the lower lumbar spine and left pelvic area under the care of Dr. Roselind Messier. The patient received a total dose of 3000 cGy. in 15 fractions between 10/14/2007 through 11/07/2007  2.Status post 6 cycles of systemic chemotherapy with Revlimid and Decadron. Last dose given April 2009.  3. status post autologous peripheral blood stem cell transplant on 04/01/2008 under the care of Dr. Vicente Serene at [facility].   CURRENT THERAPY: Zometa 4 mg IV given every 3 months  INTERVAL HISTORY: Ruth Sanchez 67 y.o. female returns to the clinic today for followup visit accompanied her husband. Ending of left hip pain that has been ongoing for the past 2 months. Plain films of the left hip were negative for lesions related to her multiple myeloma but did reveal some degenerative joint changes. She presents today to proceed with her every three-month Zometa infusion. She voiced no other complaints. Her medication list was reviewed and updated. She reports she has an appointment with her primary care physician on 01/22/2013.  MEDICAL HISTORY: Past Medical History  Diagnosis Date  . Cancer   . Hypertension   . Asthma     ALLERGIES:  has No Known Allergies.  MEDICATIONS:  Current Outpatient Prescriptions  Medication Sig Dispense Refill  . albuterol (PROVENTIL HFA;VENTOLIN HFA) 108 (90 BASE) MCG/ACT inhaler Inhale 2 puffs into the lungs every 6 (six) hours as needed. For shortness of breath.      . carisoprodol (SOMA) 350 MG tablet Take 175 mg by mouth 4 (four) times daily as needed. For muscle pain.      . metoprolol tartrate (LOPRESSOR) 25 MG tablet Take 25 mg by mouth 2 (two) times daily.      . naproxen sodium (ANAPROX) 220 MG tablet Take 440 mg by mouth 2 (two) times daily as  needed. For pain.      Marland Kitchen oxyCODONE-acetaminophen (PERCOCET) 10-325 MG per tablet Take 1 tablet by mouth every 6 (six) hours as needed for pain. For pain.  20 tablet  0   No current facility-administered medications for this visit.   Facility-Administered Medications Ordered in Other Visits  Medication Dose Route Frequency Provider Last Rate Last Dose  . heparin lock flush 100 unit/mL  500 Units Intracatheter Once PRN Si Gaul, MD        SURGICAL HISTORY:  Past Surgical History  Procedure Laterality Date  . Appendectomy    . Tubal ligation    . Cholecystectomy      REVIEW OF SYSTEMS:  A comprehensive review of systems was negative except for: Musculoskeletal: positive for Left hip pain   PHYSICAL EXAMINATION: General appearance: alert, cooperative and no distress Head: Normocephalic, without obvious abnormality, atraumatic Neck: no adenopathy Lymph nodes: Cervical, supraclavicular, and axillary nodes normal. Resp: clear to auscultation bilaterally Back: Tenderness in the left flank area to palpation Cardio: regular rate and rhythm, S1, S2 normal, no murmur, click, rub or gallop GI: soft, non-tender; bowel sounds normal; no masses,  no organomegaly Extremities: extremities normal, atraumatic, no cyanosis or edema, point tenderness, left SI joint and laterally toward hip  ECOG PERFORMANCE STATUS: 1 - Symptomatic but completely ambulatory  Blood pressure 160/98, pulse 85, temperature 98.3 F (36.8 C), temperature source Oral, resp. rate 18, height 4\' 11"  (1.499 m), weight 160  lb 12.8 oz (72.938 kg).  LABORATORY DATA: Lab Results  Component Value Date   WBC 6.7 01/19/2013   HGB 12.5 01/19/2013   HCT 37.8 01/19/2013   MCV 91.7 01/19/2013   PLT 145 01/19/2013      Chemistry      Component Value Date/Time   NA 143 01/19/2013 1058   NA 139 10/20/2012 1704   K 3.1* 01/19/2013 1058   K 3.4* 10/20/2012 1704   CL 104 01/19/2013 1058   CL 102 10/20/2012 1704   CO2 26 01/19/2013  1058   CO2 27 10/20/2012 1704   BUN 9.9 01/19/2013 1058   BUN 15 10/20/2012 1704   CREATININE 0.7 01/19/2013 1058   CREATININE 0.59 10/20/2012 1704      Component Value Date/Time   CALCIUM 9.1 01/19/2013 1058   CALCIUM 9.0 10/20/2012 1704   ALKPHOS 96 01/19/2013 1058   ALKPHOS 118* 10/20/2012 1704   AST 21 01/19/2013 1058   AST 18 10/20/2012 1704   ALT 14 01/19/2013 1058   ALT 13 10/20/2012 1704   BILITOT 0.62 01/19/2013 1058   BILITOT 0.4 10/20/2012 1704       RADIOGRAPHIC STUDIES: Dg Abd Acute W/chest  10/20/2012  *RADIOLOGY REPORT*  Clinical Data: Left-sided flank pain.  ACUTE ABDOMEN SERIES (ABDOMEN 2 VIEW & CHEST 1 VIEW)  Comparison: Chest film of 10/08/2009.  Findings: Frontal view of the chest demonstrates high-riding humeral heads bilaterally, suggesting chronic rotator cuff insufficiency.  Moderate cardiomegaly, accentuated by low lung volumes.  Right paratracheal soft tissue fullness is favored to be related to great vessels, given the stability back to the 2010 study.  No pleural effusion or pneumothorax.  Chronic interstitial thickening, accentuated by low lung volumes. No lobar consolidation.  Mild bibasilar scarring or atelectasis.  Abominal films demonstrate no free intraperitoneal air on upright positioning.  Moderate amount of colonic stool.  Cholecystectomy clips.  No small bowel dilatation.  No plain film evidence of urinary tract calculi.  A right pelvic calcification likely corresponds to an ovarian dermoid, when correlated with 12/12/2007 CT.  Degenerative changes of bilateral hips and the lumbar spine.  IMPRESSION: Possible constipation.  Cardiomegaly and chronic lung changes.   Original Report Authenticated By: Jeronimo Greaves, M.D.     ASSESSMENT/PLAN: This is a very pleasant 67 years old African American female with history of multiple myeloma status post systemic chemotherapy with Revlimid and Decadron followed by autologous peripheral blood stem cell transplant and has  been observation since June of 2009 with no evidence for disease recurrence. Her myeloma panel from December 2013 showed relatively stable disease. Patient was discussed with Dr. Arbutus Ped. She will proceed with her scheduled Zometa infusion today. She is encouraged to followup with her primary care physician as scheduled on 01/22/2013 and is given a copy of her left heel x-ray report. She'll followup with Dr. Arbutus Ped in 3 months with repeat CBC differential, C. met, LDH, quantitative immunoglobin, beta 2 microglobulin and serum light chains to reevaluate her disease prior to her next scheduled infusion of Zometa. The patient understands that any pain medications needed for her left hip pain her to come from her primary care physician.  Ruth Sanchez, Jayko Voorhees E, PA-C   All questions were answered. The patient knows to call the clinic with any problems, questions or concerns. We can certainly see the patient much sooner if necessary.

## 2013-01-19 NOTE — Patient Instructions (Addendum)
The x-ray of your hip showed arthritis Followup with your primary care physician as scheduled. Pain medication for this problem should be prescribed by your primary care physician. Proceed with her Zometa today as scheduled Followup with Dr. Arbutus Ped in 3 months with repeat protein studies to reevaluate your disease prior to your next scheduled Zometa infusion

## 2013-01-19 NOTE — Progress Notes (Signed)
Quick Note:  Call patient with the result and order K Dur 20 meq po qd X 7 ______ 

## 2013-01-20 ENCOUNTER — Telehealth: Payer: Self-pay | Admitting: Medical Oncology

## 2013-01-20 DIAGNOSIS — E876 Hypokalemia: Secondary | ICD-10-CM

## 2013-01-20 MED ORDER — POTASSIUM CHLORIDE CRYS ER 20 MEQ PO TBCR: 20.0000 meq | EXTENDED_RELEASE_TABLET | Freq: Every day | ORAL | Status: DC

## 2013-01-20 NOTE — Telephone Encounter (Signed)
Message copied by Charma Igo on Tue Jan 20, 2013  4:21 PM ------      Message from: Si Gaul      Created: Mon Jan 19, 2013  8:18 PM       Call patient with the result and order K Dur 20 meq po qd X 7 ------

## 2013-01-20 NOTE — Telephone Encounter (Signed)
Called kdur to pt and pharmacy

## 2013-01-20 NOTE — Telephone Encounter (Signed)
Message copied by Charma Igo on Tue Jan 20, 2013  3:12 PM ------      Message from: Si Gaul      Created: Mon Jan 19, 2013  8:18 PM       Call patient with the result and order K Dur 20 meq po qd X 7 ------

## 2013-02-22 ENCOUNTER — Emergency Department (HOSPITAL_COMMUNITY): Payer: Medicare Other

## 2013-02-22 ENCOUNTER — Inpatient Hospital Stay (HOSPITAL_COMMUNITY)
Admission: EM | Admit: 2013-02-22 | Discharge: 2013-02-23 | DRG: 202 | Discharge status: Against Medical Advice | Payer: Medicare Other | Attending: Internal Medicine | Admitting: Internal Medicine

## 2013-02-22 ENCOUNTER — Encounter (HOSPITAL_COMMUNITY): Payer: Self-pay | Admitting: *Deleted

## 2013-02-22 DIAGNOSIS — C9001 Multiple myeloma in remission: Secondary | ICD-10-CM | POA: Diagnosis present

## 2013-02-22 DIAGNOSIS — I480 Paroxysmal atrial fibrillation: Secondary | ICD-10-CM | POA: Diagnosis present

## 2013-02-22 DIAGNOSIS — J45909 Unspecified asthma, uncomplicated: Secondary | ICD-10-CM | POA: Diagnosis present

## 2013-02-22 DIAGNOSIS — R0600 Dyspnea, unspecified: Secondary | ICD-10-CM | POA: Diagnosis present

## 2013-02-22 DIAGNOSIS — E049 Nontoxic goiter, unspecified: Secondary | ICD-10-CM | POA: Diagnosis present

## 2013-02-22 DIAGNOSIS — I10 Essential (primary) hypertension: Secondary | ICD-10-CM | POA: Diagnosis present

## 2013-02-22 DIAGNOSIS — J209 Acute bronchitis, unspecified: Principal | ICD-10-CM | POA: Diagnosis present

## 2013-02-22 DIAGNOSIS — Z79899 Other long term (current) drug therapy: Secondary | ICD-10-CM

## 2013-02-22 DIAGNOSIS — M129 Arthropathy, unspecified: Secondary | ICD-10-CM | POA: Diagnosis present

## 2013-02-22 DIAGNOSIS — I4891 Unspecified atrial fibrillation: Secondary | ICD-10-CM | POA: Diagnosis present

## 2013-02-22 DIAGNOSIS — R0602 Shortness of breath: Secondary | ICD-10-CM

## 2013-02-22 DIAGNOSIS — E876 Hypokalemia: Secondary | ICD-10-CM | POA: Diagnosis not present

## 2013-02-22 DIAGNOSIS — J398 Other specified diseases of upper respiratory tract: Secondary | ICD-10-CM | POA: Diagnosis present

## 2013-02-22 DIAGNOSIS — M1990 Unspecified osteoarthritis, unspecified site: Secondary | ICD-10-CM | POA: Diagnosis present

## 2013-02-22 HISTORY — DX: Unspecified osteoarthritis, unspecified site: M19.90

## 2013-02-22 LAB — BASIC METABOLIC PANEL
Chloride: 105 mEq/L (ref 96–112)
Creatinine, Ser: 0.56 mg/dL (ref 0.50–1.10)
GFR calc Af Amer: >90 mL/min (ref >90)
Sodium: 138 mEq/L (ref 135–145)

## 2013-02-22 LAB — CBC WITH DIFFERENTIAL/PLATELET
Basophils Absolute: 0 10*3/uL (ref 0.0–0.1)
Basophils Relative: 0 % (ref 0–1)
HCT: 35.8 % — ABNORMAL LOW (ref 36.0–46.0)
MCHC: 32.1 g/dL (ref 30.0–36.0)
Monocytes Absolute: 0.5 10*3/uL (ref 0.1–1.0)
Neutro Abs: 3.9 10*3/uL (ref 1.7–7.7)
Neutrophils Relative %: 58 % (ref 43–77)
Platelets: 128 10*3/uL — ABNORMAL LOW (ref 150–400)
RDW: 14 % (ref 11.5–15.5)
WBC: 6.8 10*3/uL (ref 4.0–10.5)

## 2013-02-22 LAB — URINALYSIS, ROUTINE W REFLEX MICROSCOPIC
Ketones, ur: NEGATIVE mg/dL
Nitrite: NEGATIVE
Protein, ur: NEGATIVE mg/dL
pH: 6.5 (ref 5.0–8.0)

## 2013-02-22 LAB — URINE MICROSCOPIC-ADD ON

## 2013-02-22 MED ORDER — ALBUTEROL SULFATE (5 MG/ML) 0.5% IN NEBU
5.0000 mg | INHALATION_SOLUTION | Freq: Once | RESPIRATORY_TRACT | Status: AC
  Administered 2013-02-23: 5 mg via RESPIRATORY_TRACT
  Filled 2013-02-22: qty 1

## 2013-02-22 NOTE — ED Notes (Signed)
Bed:WA24<BR> Expected date:<BR> Expected time:<BR> Means of arrival:<BR> Comments:<BR> EMS

## 2013-02-22 NOTE — ED Notes (Signed)
Per EMS, pt was c/o SOB today onset at 4pm. Has h/o of asthma. Last 2 days she has had cold like sx's. Conscious alert and oriented upon arrival with fire department at the scene. Lungs sounds clear and albuterol neb tx administered in route by EMS. Pt stated that the neb tx made her feel better.

## 2013-02-22 NOTE — ED Notes (Signed)
Pt states she had cancer treatment for bone cancer 2 wks ago. She goes q 3mos.

## 2013-02-22 NOTE — ED Notes (Signed)
Patient transported to X-ray 

## 2013-02-23 ENCOUNTER — Emergency Department (HOSPITAL_COMMUNITY): Payer: Medicare Other

## 2013-02-23 ENCOUNTER — Encounter (HOSPITAL_COMMUNITY): Payer: Self-pay

## 2013-02-23 DIAGNOSIS — I480 Paroxysmal atrial fibrillation: Secondary | ICD-10-CM | POA: Diagnosis present

## 2013-02-23 DIAGNOSIS — J209 Acute bronchitis, unspecified: Principal | ICD-10-CM | POA: Diagnosis present

## 2013-02-23 DIAGNOSIS — R0600 Dyspnea, unspecified: Secondary | ICD-10-CM | POA: Diagnosis present

## 2013-02-23 DIAGNOSIS — M129 Arthropathy, unspecified: Secondary | ICD-10-CM

## 2013-02-23 DIAGNOSIS — I4891 Unspecified atrial fibrillation: Secondary | ICD-10-CM

## 2013-02-23 DIAGNOSIS — J45909 Unspecified asthma, uncomplicated: Secondary | ICD-10-CM | POA: Diagnosis present

## 2013-02-23 DIAGNOSIS — R0602 Shortness of breath: Secondary | ICD-10-CM

## 2013-02-23 DIAGNOSIS — C9001 Multiple myeloma in remission: Secondary | ICD-10-CM

## 2013-02-23 DIAGNOSIS — E876 Hypokalemia: Secondary | ICD-10-CM | POA: Diagnosis present

## 2013-02-23 DIAGNOSIS — E049 Nontoxic goiter, unspecified: Secondary | ICD-10-CM | POA: Diagnosis present

## 2013-02-23 DIAGNOSIS — M1990 Unspecified osteoarthritis, unspecified site: Secondary | ICD-10-CM | POA: Diagnosis present

## 2013-02-23 DIAGNOSIS — R0609 Other forms of dyspnea: Secondary | ICD-10-CM

## 2013-02-23 LAB — CK TOTAL AND CKMB (NOT AT ARMC)
CK, MB: 7.4 ng/mL (ref 0.3–4.0)
CK, MB: 6.1 ng/mL (ref 0.3–4.0)
Relative Index: 2.7 — ABNORMAL HIGH (ref 0.0–2.5)
Total CK: 277 U/L — ABNORMAL HIGH (ref 7–177)
Total CK: 231 U/L — ABNORMAL HIGH (ref 7–177)

## 2013-02-23 LAB — POCT I-STAT TROPONIN I: Troponin i, poc: 0 ng/mL (ref 0.00–0.08)

## 2013-02-23 LAB — TROPONIN I: Troponin I: <0.3 ng/mL (ref <0.30)

## 2013-02-23 LAB — BASIC METABOLIC PANEL
CO2: 26 mEq/L (ref 19–32)
Chloride: 103 mEq/L (ref 96–112)
Creatinine, Ser: 0.45 mg/dL — ABNORMAL LOW (ref 0.50–1.10)
Potassium: 3 mEq/L — ABNORMAL LOW (ref 3.5–5.1)
Sodium: 140 mEq/L (ref 135–145)

## 2013-02-23 LAB — CBC
HCT: 37.2 % (ref 36.0–46.0)
Hemoglobin: 12 g/dL (ref 12.0–15.0)
MCV: 92.5 fL (ref 78.0–100.0)
RBC: 4.02 MIL/uL (ref 3.87–5.11)
WBC: 7.2 10*3/uL (ref 4.0–10.5)

## 2013-02-23 LAB — TSH: TSH: 0.4 u[IU]/mL (ref 0.350–4.500)

## 2013-02-23 MED ORDER — SODIUM CHLORIDE 0.9 % IV SOLN
INTRAVENOUS | Status: DC
  Administered 2013-02-23: 05:00:00 via INTRAVENOUS

## 2013-02-23 MED ORDER — DILTIAZEM HCL 60 MG PO TABS: 60.0000 mg | ORAL_TABLET | Freq: Three times a day (TID) | ORAL | Status: DC

## 2013-02-23 MED ORDER — MONTELUKAST SODIUM 10 MG PO TABS
10.0000 mg | ORAL_TABLET | Freq: Every day | ORAL | Status: DC
  Filled 2013-02-23: qty 1

## 2013-02-23 MED ORDER — SODIUM CHLORIDE 0.9 % IV SOLN: INTRAVENOUS | Status: DC

## 2013-02-23 MED ORDER — IOHEXOL 350 MG/ML SOLN
100.0000 mL | Freq: Once | INTRAVENOUS | Status: AC | PRN
  Administered 2013-02-23: 100 mL via INTRAVENOUS

## 2013-02-23 MED ORDER — OXYCODONE-ACETAMINOPHEN 10-325 MG PO TABS: 1.0000 | ORAL_TABLET | Freq: Four times a day (QID) | ORAL | Status: DC | PRN

## 2013-02-23 MED ORDER — IPRATROPIUM BROMIDE 0.02 % IN SOLN
0.5000 mg | Freq: Four times a day (QID) | RESPIRATORY_TRACT | Status: DC
  Administered 2013-02-23: 0.5 mg via RESPIRATORY_TRACT
  Filled 2013-02-23 (×2): qty 2.5

## 2013-02-23 MED ORDER — LEVALBUTEROL HCL 0.63 MG/3ML IN NEBU
0.6300 mg | INHALATION_SOLUTION | Freq: Four times a day (QID) | RESPIRATORY_TRACT | Status: DC
  Administered 2013-02-23: 0.63 mg via RESPIRATORY_TRACT
  Filled 2013-02-23 (×6): qty 3

## 2013-02-23 MED ORDER — IPRATROPIUM BROMIDE 0.02 % IN SOLN
0.5000 mg | Freq: Once | RESPIRATORY_TRACT | Status: AC
  Administered 2013-02-23: 0.5 mg via RESPIRATORY_TRACT
  Filled 2013-02-23: qty 2.5

## 2013-02-23 MED ORDER — GUAIFENESIN-DM 100-10 MG/5ML PO SYRP: 5.0000 mL | ORAL_SOLUTION | ORAL | Status: DC | PRN

## 2013-02-23 MED ORDER — LEVOFLOXACIN 750 MG PO TABS
750.0000 mg | ORAL_TABLET | Freq: Every day | ORAL | Status: DC
  Administered 2013-02-23: 750 mg via ORAL
  Filled 2013-02-23: qty 1

## 2013-02-23 MED ORDER — ACETAMINOPHEN 325 MG PO TABS: 650.0000 mg | ORAL_TABLET | Freq: Four times a day (QID) | ORAL | Status: DC | PRN

## 2013-02-23 MED ORDER — ASPIRIN 325 MG PO TABS: 325.0000 mg | ORAL_TABLET | Freq: Every day | ORAL | Status: DC

## 2013-02-23 MED ORDER — PREDNISONE 20 MG PO TABS
60.0000 mg | ORAL_TABLET | Freq: Once | ORAL | Status: AC
  Administered 2013-02-23: 60 mg via ORAL
  Filled 2013-02-23: qty 3

## 2013-02-23 MED ORDER — ALBUTEROL SULFATE (5 MG/ML) 0.5% IN NEBU: 2.5000 mg | INHALATION_SOLUTION | RESPIRATORY_TRACT | Status: DC | PRN

## 2013-02-23 MED ORDER — CARISOPRODOL 350 MG PO TABS: 175.0000 mg | ORAL_TABLET | Freq: Four times a day (QID) | ORAL | Status: DC | PRN

## 2013-02-23 MED ORDER — MOMETASONE FURO-FORMOTEROL FUM 100-5 MCG/ACT IN AERO
2.0000 | INHALATION_SPRAY | Freq: Two times a day (BID) | RESPIRATORY_TRACT | Status: DC
  Administered 2013-02-23: 2 via RESPIRATORY_TRACT
  Filled 2013-02-23: qty 8.8

## 2013-02-23 MED ORDER — PANTOPRAZOLE SODIUM 40 MG PO TBEC
40.0000 mg | DELAYED_RELEASE_TABLET | Freq: Every day | ORAL | Status: DC
  Administered 2013-02-23: 40 mg via ORAL
  Filled 2013-02-23: qty 1

## 2013-02-23 MED ORDER — DILTIAZEM HCL 60 MG PO TABS
60.0000 mg | ORAL_TABLET | Freq: Three times a day (TID) | ORAL | Status: DC
  Administered 2013-02-23 (×2): 60 mg via ORAL
  Filled 2013-02-23 (×5): qty 1

## 2013-02-23 MED ORDER — ENOXAPARIN SODIUM 80 MG/0.8ML ~~LOC~~ SOLN
70.0000 mg | Freq: Two times a day (BID) | SUBCUTANEOUS | Status: DC
  Administered 2013-02-23: 70 mg via SUBCUTANEOUS
  Filled 2013-02-23 (×4): qty 0.8

## 2013-02-23 MED ORDER — MOMETASONE FURO-FORMOTEROL FUM 100-5 MCG/ACT IN AERO: 2.0000 | INHALATION_SPRAY | Freq: Two times a day (BID) | RESPIRATORY_TRACT | Status: DC

## 2013-02-23 MED ORDER — DILTIAZEM HCL 25 MG/5ML IV SOLN
10.0000 mg | Freq: Once | INTRAVENOUS | Status: AC
  Administered 2013-02-23: 10 mg via INTRAVENOUS
  Filled 2013-02-23: qty 5

## 2013-02-23 MED ORDER — HYDROMORPHONE HCL PF 1 MG/ML IJ SOLN: 1.0000 mg | INTRAMUSCULAR | Status: DC | PRN

## 2013-02-23 MED ORDER — ALBUTEROL SULFATE (5 MG/ML) 0.5% IN NEBU
5.0000 mg | INHALATION_SOLUTION | Freq: Once | RESPIRATORY_TRACT | Status: AC
  Administered 2013-02-23: 5 mg via RESPIRATORY_TRACT
  Filled 2013-02-23: qty 1

## 2013-02-23 MED ORDER — OXYCODONE-ACETAMINOPHEN 5-325 MG PO TABS
1.0000 | ORAL_TABLET | Freq: Four times a day (QID) | ORAL | Status: DC | PRN
  Administered 2013-02-23 (×2): 1 via ORAL
  Filled 2013-02-23 (×2): qty 1

## 2013-02-23 MED ORDER — POTASSIUM CHLORIDE CRYS ER 20 MEQ PO TBCR
40.0000 meq | EXTENDED_RELEASE_TABLET | Freq: Two times a day (BID) | ORAL | Status: DC
  Administered 2013-02-23: 40 meq via ORAL
  Filled 2013-02-23 (×2): qty 2

## 2013-02-23 MED ORDER — ONDANSETRON HCL 4 MG PO TABS: 4.0000 mg | ORAL_TABLET | Freq: Four times a day (QID) | ORAL | Status: DC | PRN

## 2013-02-23 MED ORDER — SODIUM CHLORIDE 0.9 % IJ SOLN
3.0000 mL | Freq: Two times a day (BID) | INTRAMUSCULAR | Status: DC
  Administered 2013-02-23: 3 mL via INTRAVENOUS

## 2013-02-23 MED ORDER — PREDNISONE 50 MG PO TABS
60.0000 mg | ORAL_TABLET | Freq: Every day | ORAL | Status: DC
  Administered 2013-02-23: 60 mg via ORAL
  Filled 2013-02-23 (×3): qty 1

## 2013-02-23 MED ORDER — ASPIRIN 325 MG PO TABS
325.0000 mg | ORAL_TABLET | Freq: Every day | ORAL | Status: DC
  Administered 2013-02-23: 325 mg via ORAL
  Filled 2013-02-23: qty 1

## 2013-02-23 MED ORDER — OXYCODONE HCL 5 MG PO TABS
5.0000 mg | ORAL_TABLET | Freq: Four times a day (QID) | ORAL | Status: DC | PRN
  Administered 2013-02-23 (×2): 5 mg via ORAL
  Filled 2013-02-23 (×2): qty 1

## 2013-02-23 MED ORDER — ONDANSETRON HCL 4 MG/2ML IJ SOLN: 4.0000 mg | Freq: Four times a day (QID) | INTRAMUSCULAR | Status: DC | PRN

## 2013-02-23 MED ORDER — ACETAMINOPHEN 650 MG RE SUPP: 650.0000 mg | Freq: Four times a day (QID) | RECTAL | Status: DC | PRN

## 2013-02-23 MED ORDER — LEVALBUTEROL HCL 0.63 MG/3ML IN NEBU
0.6300 mg | INHALATION_SOLUTION | RESPIRATORY_TRACT | Status: DC | PRN
  Filled 2013-02-23: qty 3

## 2013-02-23 MED ORDER — ALUM & MAG HYDROXIDE-SIMETH 200-200-20 MG/5ML PO SUSP: 30.0000 mL | Freq: Four times a day (QID) | ORAL | Status: DC | PRN

## 2013-02-23 NOTE — ED Provider Notes (Signed)
Patient developed shortness of breath onset' 4 PM today. She feels improved since treatment with nebulized treatment i on exam alert nontoxic speaks in paragraphs lungs clear auscultation heart irregularly irregular  Doug Sou, MD 02/23/13 [phone]

## 2013-02-23 NOTE — Discharge Summary (Signed)
Physician Discharge Summary  KRYSTIN KEEVEN ZOX:[id number] DOB: 1946-07-20 DOA: 02/22/2013  PCP: Erlinda Hong, MD  Admit date: 02/22/2013 Discharge date: 02/23/2013  Recommendations for Outpatient Follow-up:  1. The patient left AGAINST MEDICAL ADVICE despite being advised of the risks to her health including complete obstruction of her airway and death. 2. Recommend emergent outpatient thyroid ultrasound and referral to an ENT. 3. TSH within normal limits, free T4 pending. 4. Recommend outpatient referral to a cardiologist given her new onset atrophic relation with rapid ventricular response. 5. Recommend followup electrolyte studies to assure potassium has been fully replaced.  Discharge Diagnoses:  Principal Problem:    Atrial fibrillation with RVR Active Problems:    Multiple myeloma in remission    Acute bronchitis    Dyspnea    Arthritis    Goiter with tracheal compression and mild stridor    Asthma    Hypokalemia   Discharge Condition: Stable but at risk for rehospitalization.  Diet recommendation: Low-sodium, heart healthy.  History of present illness:  Mrs. Doristine Counter is a 67 year old woman with a past medical history of high blood pressure, asthma, and multiple myeloma in remission who presented to the hospital on 02/23/2013 with the acute onset of shortness of breath, unrelieved by using her albuterol inhaler. Upon initial evaluation emergency department, the patient was noted to be in atrial fibrillation with rapid ventricular response (no prior history of atrial fibrillation).  Hospital Course by problem:  Principal Problem:   Atrial fibrillation with RVR -Patient was admitted and put on aspirin given her CHADS-2 score of 2. -Rate control was achieved with Cardizem 60 mg by mouth every 8 hours. -Although her TSH was within normal limits, the patient has a large goiter and free T4 studies are pending. Active Problems:   Hypokalemia -Orally repleted prior to  discharge.   Multiple myeloma in remission -No current acute issues.   Acute bronchitis / asthma -Suspect dyspnea is more from tracheal compression and mild stridor and from bronchitis. Would not continue antibiotics at this point. Would not treat with steroids. -Can continue bronchodilators as needed and Dulera for treatment of asthma.   Dyspnea -Patient feels better although on exam, she does seem to have mild stridor.   Arthritis -Pain medications as needed.   Goiter with tracheal compression and mild stridor -The patient was made aware of her test results and the concern that her thyroid gland was compressing her trachea. She is leaving AGAINST MEDICAL ADVICE. -A STAT thyroid ultrasound was ordered however the patient refused to have this done in the hospital. -Patient fully understands and comprehends the risk of not having this fully evaluated while in the hospital. -Recommend close followup with outpatient thyroid ultrasound and referral to an ENT.   Procedures:  Patient refused thyroid ultrasound.  Consultations:  None.  Discharge Exam: Filed Vitals:   02/23/13 1316  BP: 133/94  Pulse:   Temp:   Resp:    Filed Vitals:   02/23/13 0402 02/23/13 0521 02/23/13 0841 02/23/13 1316  BP: 149/81 117/67  133/94  Pulse: 91 95    Temp: 99.1 F (37.3 C) 98.3 F (36.8 C)    TempSrc: Oral Oral    Resp: 20 18    Height: 4\' 11"  (1.499 m)     Weight: 70.9 kg (156 lb 4.9 oz)     SpO2: 97% 96% 98%     Gen:  NAD Cardiovascular:  Heart sounds irregularly irregular Respiratory: Lungs CTAB, but mild stridor appreciated Gastrointestinal: Abdomen soft, NT/ND  with normal active bowel sounds. Extremities: No C/E/C   Discharge Instructions  Discharge Orders   Future Appointments Provider Department Dept Phone   04/20/2013 2:45 PM Delcie Roch [facility] MEDICAL ONCOLOGY [phone]   04/20/2013 3:15 PM Si Gaul, MD [facility] MEDICAL  ONCOLOGY [phone]   04/20/2013 4:00 PM [facility] MEDICAL ONCOL[facility]Y [phone]   Future Orders Complete By Expires     Call MD for:  extreme fatigue  As directed     Call MD for:  persistant dizziness or light-headedness  As directed     Call MD for:  As directed     Scheduling Instructions:      Worsening problems with your breathing, chest pain or heart palpitations.    Diet - low sodium heart healthy  As directed     Discharge instructions  As directed     Comments:      You are leaving AGAINST MEDICAL ADVICE. Your imaging studies show you have very enlarged thyroid gland which is compressing your airway. This can lead to progressive shortness of breath that can worsen to the point of cutting off your air flow. We recommended to followup with your primary care doctor tomorrow for rescheduling of your thyroid ultrasound and further evaluation of the cause of your thyroid gland enlargement.    Increase activity slowly  As directed         Medication List    TAKE these medications       albuterol 108 (90 BASE) MCG/ACT inhaler  Commonly known as:  PROVENTIL HFA;VENTOLIN HFA  Inhale 2 puffs into the lungs every 6 (six) hours as needed. For shortness of breath.     aspirin 325 MG tablet  Take 1 tablet (325 mg total) by mouth daily.     carisoprodol 350 MG tablet  Commonly known as:  SOMA  Take 175 mg by mouth 4 (four) times daily as needed. For muscle pain.     diltiazem 60 MG tablet  Commonly known as:  CARDIZEM  Take 1 tablet (60 mg total) by mouth every 8 (eight) hours.     metoprolol tartrate 25 MG tablet  Commonly known as:  LOPRESSOR  Take 25 mg by mouth 2 (two) times daily.     mometasone-formoterol 100-5 MCG/ACT Aero  Commonly known as:  DULERA  Inhale 2 puffs into the lungs 2 (two) times daily.     oxyCODONE-acetaminophen 10-325 MG per tablet  Commonly known as:  PERCOCET  Take 1 tablet by mouth every 6 (six) hours as needed for  pain. For pain.          The results of significant diagnostics from this hospitalization (including imaging, microbiology, ancillary and laboratory) are listed below for reference.    Significant Diagnostic Studies: Dg Chest 2 View  02/22/2013  *RADIOLOGY REPORT*  Clinical Data: Shortness of breath, cough.  CHEST - 2 VIEW  Comparison: 10/20/2012  Findings: Cardiomegaly.  Linear densities in the mid and lower lungs bilaterally, similar to prior study, likely scarring.  Mild interstitial prominence, stable.  No acute opacities.  No effusions.  No acute bony abnormality.  IMPRESSION: Chronic changes in the lungs bilaterally.  Cardiomegaly.  No acute findings.   Original Report Authenticated By: Charlett Nose, M.D.    Ct Angio Chest W/cm &/or Wo Cm  02/23/2013  *RADIOLOGY REPORT*  Clinical Data: 67 year old female with shortness of breath.  Cold symptoms.  History of multiple myeloma  status post therapy.  CT ANGIOGRAPHY CHEST  Technique:  Multidetector CT imaging of the chest using the standard protocol during bolus administration of intravenous contrast. Multiplanar reconstructed images including MIPs were obtained and reviewed to evaluate the vascular anatomy.  Contrast:  100 ml Omnipaque 350.  Comparison: CT abdomen 12/12/2007.  Findings: Good contrast bolus timing in the pulmonary arterial tree.  No focal filling defect identified in the pulmonary arterial tree to suggest the presence of acute pulmonary embolism.  Chronic cardiomegaly.  Biatrial enlargement.  No pericardial or pleural effusion.  Stable and negative visualized upper abdominal viscera.  No contrast in the aorta.  Evidence of some calcified coronary artery atherosclerosis.  No mediastinal lymphadenopathy.  Large left thyroid goiter with retrosternal/superior mediastinal extension.  This measures up to 56 mm in diameter.  No superior mediastinal or thoracic inlet lymphadenopathy.  Subsequent rightward deviation and mass effect on the trachea  which remains patent.  In the chest, major airways are patent.  There is bilateral dependent and curvilinear pulmonary opacity which most resembles atelectasis.  Diffusely abnormal bone mineralization.  Multilevel mild thoracic vertebral loss of height.  Exaggerated kyphosis.  No area of overt bony destruction identified.  Bone mineralization is similar to the 2009 comparison.  IMPRESSION: 1. No evidence of acute pulmonary embolus. 2.  Cardiomegaly with biatrial enlargement. 3.  Large left thyroid goiter with rightward deviation and mass effect on the trachea. 4.  Pulmonary atelectasis. 5.  Diffusely abnormal bone mineralization and keeping with history of multiple myeloma.   Original Report Authenticated By: Erskine Speed, M.D.   Labs:  Basic Metabolic Panel:  Recent Labs Lab 02/22/13 2320 02/23/13 0506  NA 138 140  K 4.2 3.0*  CL 105 103  CO2 26 26  GLUCOSE 88 114*  BUN 14 9  CREATININE 0.56 0.45*  CALCIUM 9.2 9.0   GFR Estimated Creatinine Clearance: 57.7 ml/min (by C-G formula based on Cr of 0.45).  CBC:  Recent Labs Lab 02/22/13 2320 02/23/13 0506  WBC 6.8 7.2  NEUTROABS 3.9  --   HGB 11.5* 12.0  HCT 35.8* 37.2  MCV 93.0 92.5  PLT 128* 143*   Cardiac Enzymes:  Recent Labs Lab 02/23/13 0506 02/23/13 1020  CKTOTAL 277* 231*  CKMB 7.4* 6.1*  TROPONINI <0.30 <0.30   Thyroid function studies  Recent Labs  02/23/13 0506  TSH 0.400    Time coordinating discharge: 35 minutes.  Signed:  Shawndale Kilpatrick  Pager [phone] [facility] 02/23/2013, 1:35 PM

## 2013-02-23 NOTE — Evaluation (Signed)
Physical Therapy Evaluation Patient Details Name: Ruth Sanchez MRN: [id number] DOB: Sep 07, 1946 Today's Date: 02/23/2013 Time: 0920-0931 PT Time Calculation (min): 11 min  PT Assessment / Plan / Recommendation Clinical Impression  Pt is a 67 year old female admitted with dyspnea.  Pt ambulated in hallway on room air with SaO2 98% and HR 130.  Pt denied SOB during ambulation and states she is ready to go home with spouse.  Pt had no further questions regarding d/c home.    PT Assessment  Patent does not need any further PT services    Follow Up Recommendations  No PT follow up    Does the patient have the potential to tolerate intense rehabilitation      Barriers to Discharge        Equipment Recommendations  None recommended by PT    Recommendations for Other Services     Frequency      Precautions / Restrictions Precautions Precautions: None   Pertinent Vitals/Pain See below, denies dyspnea      Mobility  Bed Mobility Bed Mobility: Supine to Sit Supine to Sit: 6: Modified independent (Device/Increase time) Transfers Transfers: Sit to Stand;Stand to Sit Sit to Stand: 5: Supervision;From bed Stand to Sit: To bed;5: Supervision Ambulation/Gait Ambulation/Gait Assistance: 5: Supervision Ambulation Distance (Feet): 220 Feet Assistive device: Straight cane Ambulation/Gait Assistance Details: pt denies SOB, SaO2 98% room air during ambulation, HR 130 Gait Pattern: Step-through pattern    Exercises     PT Diagnosis:    PT Problem List:   PT Treatment Interventions:     PT Goals    Visit Information  Last PT Received On: 02/23/13 Assistance Needed: +1    Subjective Data  Subjective: I'm ready to go home.   Prior Functioning  Home Living Lives With: Spouse Type of Home: House Home Access: Level entry Home Layout: One level Home Adaptive Equipment: Walker - four wheeled;Straight cane;Crutches Prior Function Level of Independence: Independent with  assistive device(s) Communication Communication: No difficulties    Cognition  Cognition Arousal/Alertness: Awake/alert Behavior During Therapy: WFL for tasks assessed/performed Overall Cognitive Status: Within Functional Limits for tasks assessed    Extremity/Trunk Assessment Right Lower Extremity Assessment RLE ROM/Strength/Tone: Deficits;Due to pain RLE ROM/Strength/Tone Deficits: Pt reports pain in hips from "bone cancer" as well as weak hips Left Lower Extremity Assessment LLE ROM/Strength/Tone: Deficits;Due to pain LLE ROM/Strength/Tone Deficits: Pt reports pain in hips from "bone cancer" as well as weak hips Trunk Assessment Trunk Assessment: Kyphotic   Balance    End of Session PT - End of Session Activity Tolerance: Patient tolerated treatment well Patient left: in bed;with call bell/phone within reach  GP Functional Assessment Tool Used: clinical judgement Functional Limitation: Mobility: Walking and moving around Mobility: Walking and Moving Around Current Status (O9629): At least 1 percent but less than 20 percent impaired, limited or restricted Mobility: Walking and Moving Around Goal Status [phone]): At least 1 percent but less than 20 percent impaired, limited or restricted Mobility: Walking and Moving Around Discharge Status [phone]): At least 1 percent but less than 20 percent impaired, limited or restricted   Ruth Sanchez,Ruth Sanchez 02/23/2013, 10:50 AM Ruth Sanchez, PT, DPT 02/23/2013 Pager: [phone]

## 2013-02-23 NOTE — ED Provider Notes (Signed)
History     CSN: [id number]  Arrival date & time 02/22/13  2214   First MD Initiated Contact with Patient 02/22/13 2329      Chief Complaint  Patient presents with  . Shortness of Breath    (Consider location/radiation/quality/duration/timing/severity/associated sxs/prior treatment) Patient is a 67 y.o. female presenting with shortness of breath.  Shortness of Breath Ruth Sanchez is a 67 y.o. female with a history of hypertension, asthma and bone cancer presents emergency department complaining of shortness of breath.  Onset of symptoms began acutely today at 4 p.m.  Patient states that it is associated with wheezing.  She has had similar presentations in past, however she is out of her albuterol inhaler which typically relieves the shortness of breath.  Patient became dyspneic on exertion and called EMS for transport.  Patient received nebulizer x1 in route and reports that it did improve her breathing some.  Patient denies any chest pain, syncope, cough, hemoptysis, lightheadedness, dizziness, calf pain, fevers, night sweats or chills.  Patient denies recent travel, sick contacts, chronic steroid use.  No home oxygen.  Past Medical History  Diagnosis Date  . Cancer   . Hypertension   . Asthma   . Arthritis     Past Surgical History  Procedure Laterality Date  . Appendectomy    . Tubal ligation    . Cholecystectomy      No family history on file.  History  Substance Use Topics  . Smoking status: Never Smoker   . Smokeless tobacco: Never Used  . Alcohol Use: No    OB History   Grav Para Term Preterm Abortions TAB SAB Ect Mult Living                  Review of Systems  Respiratory: Positive for shortness of breath.   Ten systems reviewed and are negative for acute change, except as noted in the HPI.    Allergies  Review of patient's allergies indicates no known allergies.  Home Medications   Current Outpatient Rx  Name  Route  Sig  Dispense  Refill  .  albuterol (PROVENTIL HFA;VENTOLIN HFA) 108 (90 BASE) MCG/ACT inhaler   Inhalation   Inhale 2 puffs into the lungs every 6 (six) hours as needed. For shortness of breath.         . carisoprodol (SOMA) 350 MG tablet   Oral   Take 175 mg by mouth 4 (four) times daily as needed. For muscle pain.         . metoprolol tartrate (LOPRESSOR) 25 MG tablet   Oral   Take 25 mg by mouth 2 (two) times daily.         Marland Kitchen oxyCODONE-acetaminophen (PERCOCET) 10-325 MG per tablet   Oral   Take 1 tablet by mouth every 6 (six) hours as needed for pain. For pain.   20 tablet   0     BP 105/88  Pulse 80  Temp(Src) 98.7 F (37.1 C) (Oral)  Resp 16  SpO2 97%  Physical Exam  Nursing note and vitals reviewed. Constitutional: She is oriented to person, place, and time. She appears well-developed and well-nourished. No distress.  HENT:  Head: Normocephalic and atraumatic.  Eyes: Conjunctivae and EOM are normal.  Neck: Normal range of motion.  No stridor, JVD or nuchal rigidity.  Cardiovascular:  Irregularly irregular.  Intact distal pulses.  No pitting edema bilaterally.  Pulmonary/Chest: Effort normal.  Lungs with mild expiratory wheezing bilaterally.  Effort  normal.   Abdominal:  Soft nontender.  No pulsatile aorta.  Musculoskeletal: Normal range of motion.  Neurological: She is alert and oriented to person, place, and time.  Skin: Skin is warm and dry. No rash noted. She is not diaphoretic.  No bruising.  Psychiatric: She has a normal mood and affect. Her behavior is normal.    ED Course  Procedures (including critical care time)  Labs Reviewed  CBC WITH DIFFERENTIAL - Abnormal; Notable for the following:    RBC 3.85 (*)    Hemoglobin 11.5 (*)    HCT 35.8 (*)    Platelets 128 (*)    All other components within normal limits  URINALYSIS, ROUTINE W REFLEX MICROSCOPIC - Abnormal; Notable for the following:    APPearance HAZY (*)    Leukocytes, UA MODERATE (*)    All other  components within normal limits  URINE CULTURE  BASIC METABOLIC PANEL  URINE MICROSCOPIC-ADD ON  POCT I-STAT TROPONIN I   Dg Chest 2 View  02/22/2013  *RADIOLOGY REPORT*  Clinical Data: Shortness of breath, cough.  CHEST - 2 VIEW  Comparison: 10/20/2012  Findings: Cardiomegaly.  Linear densities in the mid and lower lungs bilaterally, similar to prior study, likely scarring.  Mild interstitial prominence, stable.  No acute opacities.  No effusions.  No acute bony abnormality.  IMPRESSION: Chronic changes in the lungs bilaterally.  Cardiomegaly.  No acute findings.   Original Report Authenticated By: Charlett Nose, M.D.    Ct Angio Chest W/cm &/or Wo Cm  02/23/2013  *RADIOLOGY REPORT*  Clinical Data: 67 year old female with shortness of breath.  Cold symptoms.  History of multiple myeloma status post therapy.  CT ANGIOGRAPHY CHEST  Technique:  Multidetector CT imaging of the chest using the standard protocol during bolus administration of intravenous contrast. Multiplanar reconstructed images including MIPs were obtained and reviewed to evaluate the vascular anatomy.  Contrast:  100 ml Omnipaque 350.  Comparison: CT abdomen 12/12/2007.  Findings: Good contrast bolus timing in the pulmonary arterial tree.  No focal filling defect identified in the pulmonary arterial tree to suggest the presence of acute pulmonary embolism.  Chronic cardiomegaly.  Biatrial enlargement.  No pericardial or pleural effusion.  Stable and negative visualized upper abdominal viscera.  No contrast in the aorta.  Evidence of some calcified coronary artery atherosclerosis.  No mediastinal lymphadenopathy.  Large left thyroid goiter with retrosternal/superior mediastinal extension.  This measures up to 56 mm in diameter.  No superior mediastinal or thoracic inlet lymphadenopathy.  Subsequent rightward deviation and mass effect on the trachea which remains patent.  In the chest, major airways are patent.  There is bilateral dependent and  curvilinear pulmonary opacity which most resembles atelectasis.  Diffusely abnormal bone mineralization.  Multilevel mild thoracic vertebral loss of height.  Exaggerated kyphosis.  No area of overt bony destruction identified.  Bone mineralization is similar to the 2009 comparison.  IMPRESSION: 1. No evidence of acute pulmonary embolus. 2.  Cardiomegaly with biatrial enlargement. 3.  Large left thyroid goiter with rightward deviation and mass effect on the trachea. 4.  Pulmonary atelectasis. 5.  Diffusely abnormal bone mineralization and keeping with history of multiple myeloma.   Original Report Authenticated By: Erskine Speed, M.D.      1. SOB (shortness of breath)   2. New onset a-fib       MDM  67 year old with history of multiple myeloma (Dr. Arbutus Ped), asthma and hypertension presented today with chief complaint of shortness of breath.  Patient found to be in atrial fibrillation with rate less than 100.  CT angiography ordered to rule out pulmonary embolism.  Nebulizers given with mild improvement of shortness of breath however patient still reports SOB.  Labs and imaging reviewed with no acute changes found.  Patient to be admitted for further evaluation of new onset atrial fibrillation.  Patient seen and discussed with attending who agrees with work up & disposition plan. The patient appears reasonably stabilized for admission considering the current resources, flow, and capabilities available in the ED at this time, and I doubt any other [facility] requiring further screening and/or treatment in the ED prior to admission.       Jaci Carrel, [place] 02/23/13 [phone]

## 2013-02-23 NOTE — Progress Notes (Signed)
ANTICOAGULATION CONSULT NOTE - Initial Consult  Pharmacy Consult for lovenox Indication: atrial fibrillation  No Known Allergies  Patient Measurements: Height: 4\' 11"  (149.9 cm) Weight: 156 lb 4.9 oz (70.9 kg) IBW/kg (Calculated) : 43.2 Heparin Dosing Weight:   Vital Signs: Temp: 99.1 F (37.3 C) (05/05 0402) Temp src: Oral (05/05 0402) BP: 149/81 mmHg (05/05 0402) Pulse Rate: 91 (05/05 0402)  Labs:  Recent Labs  02/22/13 2320  HGB 11.5*  HCT 35.8*  PLT 128*  CREATININE 0.56    Estimated Creatinine Clearance: 57.7 ml/min (by C-G formula based on Cr of 0.56).   Medical History: Past Medical History  Diagnosis Date  . Cancer   . Hypertension   . Asthma   . Arthritis     Medications:  Scheduled:  . sodium chloride   Intravenous STAT  . [COMPLETED] albuterol  5 mg Nebulization Once  . [COMPLETED] albuterol  5 mg Nebulization Once  . aspirin  325 mg Oral Daily  . [COMPLETED] diltiazem  10 mg Intravenous Once  . diltiazem  60 mg Oral Q8H  . enoxaparin (LOVENOX) injection  70 mg Subcutaneous Q12H  . [COMPLETED] ipratropium  0.5 mg Nebulization Once  . levalbuterol  0.63 mg Nebulization Q6H   And  . ipratropium  0.5 mg Nebulization Q6H  . levofloxacin  750 mg Oral Daily  . mometasone-formoterol  2 puff Inhalation BID  . montelukast  10 mg Oral QHS  . pantoprazole  40 mg Oral Q0600  . [COMPLETED] predniSONE  60 mg Oral Once  . predniSONE  60 mg Oral Q breakfast  . sodium chloride  3 mL Intravenous Q12H    Assessment: Patient with Afib.    Goal of Therapy:  Anti-Xa level 0.6-1.2 units/ml 4hrs after LMWH dose given Monitor platelets by anticoagulation protocol: Yes   Plan:  Lovenox 70mg  sq sq [address], Ruth Sanchez 02/23/2013,4:22 AM

## 2013-02-23 NOTE — Care Management Note (Signed)
    Page 1 of 1   02/23/2013     10:07:18 AM   CARE MANAGEMENT NOTE 02/23/2013  Patient:  Ruth Sanchez, Ruth Sanchez   Account Number:  [id number]  Date Initiated:  02/23/2013  Documentation initiated by:  Lorenda Ishihara  Subjective/Objective Assessment:   67 yo female admitted with SOB, Afib RVR. PTA lived at home with spouse.     Action/Plan:   Home when stable   Anticipated DC Date:  02/26/2013   Anticipated DC Plan:  HOME/SELF CARE      DC Planning Services  CM consult      Choice offered to / List presented to:             Status of service:  Completed, signed off Medicare Important Message given?   (If response is "NO", the following Medicare IM given date fields will be blank) Date Medicare IM given:   Date Additional Medicare IM given:    Discharge Disposition:  HOME/SELF CARE  Per UR Regulation:  Reviewed for med. necessity/level of care/duration of stay  If discussed at Long Length of Stay Meetings, dates discussed:    Comments:

## 2013-02-23 NOTE — Evaluation (Signed)
Occupational Therapy Evaluation Patient Details Name: Ruth Sanchez MRN: [id number] DOB: 12/25/1945 Today's Date: 02/23/2013 Time: 1129- 1149    OT Assessment / Plan / Recommendation Clinical Impression  Pt presents at overall S- I level with simple ADL activity s/p admission to [facility] with dyspnea.  Pt feels she is back to baseline.  Educated pt on safety and fall prevention with ADL activity  No further OT needed at this time  OT Assessment  Patient does not need any further OT services    Follow Up Recommendations  No OT follow up                Precautions / Restrictions Precautions Precautions: None       ADL  Eating/Feeding: Performed;Independent Where Assessed - Eating/Feeding: Edge of bed Grooming: Performed;Wash/dry hands;Independent Lower Body Dressing: Performed;Supervision/safety Where Assessed - Lower Body Dressing: Unsupported sit to stand Toilet Transfer: Performed;Supervision/safety;Other (comment) (S due to IV) Toilet Transfer Method: Sit to Barista: Comfort height toilet Toileting - Clothing Manipulation and Hygiene: Performed;Independent Where Assessed - Toileting Clothing Manipulation and Hygiene: Standing          Visit Information  Assistance Needed: +1    Subjective Data  Subjective: I was cooking when i got short of breath   Prior Functioning     Home Living Lives With: Spouse Type of Home: House Home Access: Level entry Home Layout: One level Bathroom Shower/Tub: Tub/shower unit;Other (comment) (pt does a sponge bath) Bathroom Toilet: Handicapped height Home Adaptive Equipment: Walker - four wheeled;Straight cane;Crutches Prior Function Level of Independence: Independent with assistive device(s) Communication Communication: No difficulties Dominant Hand: Right         Vision/Perception Vision - History Patient Visual Report: No change from baseline   Cognition  Cognition Arousal/Alertness:  Awake/alert Behavior During Therapy: WFL for tasks assessed/performed Overall Cognitive Status: Within Functional Limits for tasks assessed    Extremity/Trunk Assessment Right Upper Extremity Assessment RUE ROM/Strength/Tone: WFL for tasks assessed Left Upper Extremity Assessment LUE ROM/Strength/Tone: WFL for tasks assessed Right Lower Extremity Assessment RLE ROM/Strength/Tone: Deficits;Due to pain RLE ROM/Strength/Tone Deficits: Pt reports pain in hips from "bone cancer" as well as weak hips Left Lower Extremity Assessment LLE ROM/Strength/Tone: Deficits;Due to pain LLE ROM/Strength/Tone Deficits: Pt reports pain in hips from "bone cancer" as well as weak hips Trunk Assessment Trunk Assessment: Kyphotic     Mobility Bed Mobility Bed Mobility: Supine to Sit Supine to Sit: 6: Modified independent (Device/Increase time) Transfers Sit to Stand: 5: Supervision;From bed;From toilet Stand to Sit: To bed;5: Supervision;To chair/3-in-1           End of Session OT - End of Session Activity Tolerance: Patient tolerated treatment well Patient left: in chair  GO Functional Assessment Tool Used: clinical observation Functional Limitation: Self care Self Care Current Status (W0981): At least 1 percent but less than 20 percent impaired, limited or restricted Self Care Goal Status (X9147): At least 1 percent but less than 20 percent impaired, limited or restricted Self Care Discharge Status [phone]): At least 1 percent but less than 20 percent impaired, limited or restricted   Gerald Kuehl, Metro Kung 02/23/2013, 12:11 PM

## 2013-02-23 NOTE — H&P (Signed)
History and Physical       Hospital Admission Note Date: 02/23/2013  Patient name: Ruth Sanchez Medical record number: [id number] Date of birth: Jul 18, 1946 Age: 67 y.o. Gender: female PCP: Ruth Hong, MD  Primary oncologist: Dr. Arbutus Ped  Chief Complaint:  Shortness of breath with wheezing today  HPI: Patient is a 67 year old female with history of hypertension, asthma, multiple myeloma presented to ED with acute shortness of breath that started around 4 PM today. Patient denied any chest pain however she had wheezing. She was out of her albuterol inhaler which usually relieves the wheezing and shortness of breath. She had difficult time catching her breath Ruth Sanchez called EMS. Patient received nebulizer treatment en route and in ED which improved her breathing. In ED patient was also noted to be in A. fib with RVR, patient reported no prior history of A. fib.  Review of Systems:  Constitutional: Denies fever, chills, diaphoresis, poor appetite and fatigue.  HEENT: Denies photophobia, eye pain, redness, hearing loss, ear pain, + congestion, she denies sore throat, rhinorrhea, sneezing, mouth sores, trouble swallowing, neck pain, neck stiffness and tinnitus.   Respiratory: See history of present illness Cardiovascular: Denies chest pain, palpitations and leg swelling.  Gastrointestinal: Denies nausea, vomiting, abdominal pain, diarrhea, constipation, blood in stool and abdominal distention.  Genitourinary: Denies dysuria, urgency, frequency, hematuria, flank pain and difficulty urinating.  Musculoskeletal: Denies myalgias, back pain, joint swelling, arthralgias and gait problem.  Skin: Denies pallor, rash and wound.  Neurological: Denies dizziness, seizures, syncope, weakness, light-headedness, numbness and headaches.  Hematological: Denies adenopathy. Easy bruising, personal or family bleeding history  Psychiatric/Behavioral: Denies  suicidal ideation, mood changes, confusion, nervousness, sleep disturbance and agitation  Past Medical History: Past Medical History  Diagnosis Date  . Cancer   . Hypertension   . Asthma   . Arthritis    Past Surgical History  Procedure Laterality Date  . Appendectomy    . Tubal ligation    . Cholecystectomy      Medications: Prior to Admission medications   Medication Sig Start Date End Date Taking? Authorizing Provider  albuterol (PROVENTIL HFA;VENTOLIN HFA) 108 (90 BASE) MCG/ACT inhaler Inhale 2 puffs into the lungs every 6 (six) hours as needed. For shortness of breath.   Yes Historical Provider, MD  carisoprodol (SOMA) 350 MG tablet Take 175 mg by mouth 4 (four) times daily as needed. For muscle pain.   Yes Historical Provider, MD  metoprolol tartrate (LOPRESSOR) 25 MG tablet Take 25 mg by mouth 2 (two) times daily.   Yes Historical Provider, MD  oxyCODONE-acetaminophen (PERCOCET) 10-325 MG per tablet Take 1 tablet by mouth every 6 (six) hours as needed for pain. For pain. 01/15/13  Yes Si Gaul, MD    Allergies:  No Known Allergies  Social History:  reports that she has never smoked. She has never used smokeless tobacco. She reports that she does not drink alcohol or use illicit drugs.  Family History: No family history on file.  Physical Exam: Blood pressure 105/88, pulse 80, temperature 98.7 F (37.1 C), temperature source Oral, resp. rate 16, SpO2 97.00%. General: Alert, awake, oriented x3, in no acute distress. HEENT: normocephalic, atraumatic, anicteric sclera, pink conjunctiva, pupils equal and reactive to light and accomodation, oropharynx clear Neck: supple, no masses or lymphadenopathy, no goiter, no bruits  Heart: Irregularly irregular Lungs: Diffuse wheezing bilaterally  Abdomen: Soft, nontender, nondistended, positive bowel sounds, no masses. Extremities: No clubbing, cyanosis or edema with positive pedal pulses. Neuro: Grossly intact, no  focal  neurological deficits, strength 5/5 upper and lower extremities bilaterally Psych: alert and oriented x 3, normal mood and affect Skin: no rashes or lesions, warm and dry   LABS on Admission:  Basic Metabolic Panel:  Recent Labs Lab 02/22/13 2320  NA 138  K 4.2  CL 105  CO2 26  GLUCOSE 88  BUN 14  CREATININE 0.56  CALCIUM 9.2   Liver Function Tests: No results found for this basename: AST, ALT, ALKPHOS, BILITOT, PROT, ALBUMIN,  in the last 168 hours No results found for this basename: LIPASE, AMYLASE,  in the last 168 hours No results found for this basename: AMMONIA,  in the last 168 hours CBC:  Recent Labs Lab 02/22/13 2320  WBC 6.8  NEUTROABS 3.9  HGB 11.5*  HCT 35.8*  MCV 93.0  PLT 128*     Radiological Exams on Admission: Dg Chest 2 View  02/22/2013  *RADIOLOGY REPORT*  Clinical Data: Shortness of breath, cough.  CHEST - 2 VIEW  Comparison: 10/20/2012  Findings: Cardiomegaly.  Linear densities in the mid and lower lungs bilaterally, similar to prior study, likely scarring.  Mild interstitial prominence, stable.  No acute opacities.  No effusions.  No acute bony abnormality.  IMPRESSION: Chronic changes in the lungs bilaterally.  Cardiomegaly.  No acute findings.   Original Report Authenticated By: Charlett Nose, M.D.    Ct Angio Chest W/cm &/or Wo Cm  02/23/2013  *RADIOLOGY REPORT*  Clinical Data: 67 year old female with shortness of breath.  Cold symptoms.  History of multiple myeloma status post therapy.  CT ANGIOGRAPHY CHEST  Technique:  Multidetector CT imaging of the chest using the standard protocol during bolus administration of intravenous contrast. Multiplanar reconstructed images including MIPs were obtained and reviewed to evaluate the vascular anatomy.  Contrast:  100 ml Omnipaque 350.  Comparison: CT abdomen 12/12/2007.  Findings: Good contrast bolus timing in the pulmonary arterial tree.  No focal filling defect identified in the pulmonary arterial tree to  suggest the presence of acute pulmonary embolism.  Chronic cardiomegaly.  Biatrial enlargement.  No pericardial or pleural effusion.  Stable and negative visualized upper abdominal viscera.  No contrast in the aorta.  Evidence of some calcified coronary artery atherosclerosis.  No mediastinal lymphadenopathy.  Large left thyroid goiter with retrosternal/superior mediastinal extension.  This measures up to 56 mm in diameter.  No superior mediastinal or thoracic inlet lymphadenopathy.  Subsequent rightward deviation and mass effect on the trachea which remains patent.  In the chest, major airways are patent.  There is bilateral dependent and curvilinear pulmonary opacity which most resembles atelectasis.  Diffusely abnormal bone mineralization.  Multilevel mild thoracic vertebral loss of height.  Exaggerated kyphosis.  No area of overt bony destruction identified.  Bone mineralization is similar to the 2009 comparison.  IMPRESSION: 1. No evidence of acute pulmonary embolus. 2.  Cardiomegaly with biatrial enlargement. 3.  Large left thyroid goiter with rightward deviation and mass effect on the trachea. 4.  Pulmonary atelectasis. 5.  Diffusely abnormal bone mineralization and keeping with history of multiple myeloma.   Original Report Authenticated By: Erskine Speed, M.D.     Assessment/Plan Principal Problem:   Dyspnea likely secondary to acute bronchitis and A. fib with RVR - Will admit to tele for management plan as below  Active Problems:   Acute bronchitis - Placed on xopenex and atrovent nebs, add prednisone, Levaquin, O2    Multiple myeloma in remission - Patient follows Dr. Arbutus Ped, on Zometa every  3 months    Atrial fibrillation with RVR - Obtain serial cardiac enzymes, TSH, 2-D echo for further workup - will give one dose of IV Cardizem, if tolerates and rate below 100, will start oral cardizem. May need cardizem drip if HR is not well controlled - started on therapeutic lovenox for now. CHAD2  score is 2, place on ASA 325mg  daily      Arthritis - Continue outpatient pain medications  DVT prophylaxis: Lovenox  CODE STATUS: Full code  Further plan will depend as patient's clinical course evolves and further radiologic and laboratory data become available.   Time Spent on Admission: 1 hour  RAI,RIPUDEEP M.D. [facility] 02/23/2013, 2:37 AM Pager: [phone]  If 7PM-7AM, please contact night-coverage [email or web address] Password [email or web address]

## 2013-02-23 NOTE — Progress Notes (Signed)
CRITICAL VALUE ALERT  Critical value received:  CKMB 7.4  Date of notification:  02/23/13  Time of notification:  0506  Critical value read back:yes  Nurse who received alert:  Joaquin Music  MD notified (1st page):  Lenny Pastel, NP  Time of first page:  0507  MD notified (2nd page):  Time of second page:  Responding MD:  Lenny Pastel, NP  Time MD responded:  [phone]

## 2013-02-23 NOTE — ED Provider Notes (Signed)
Medical screening examination/treatment/procedure(s) were conducted as a shared visit with non-physician practitioner(s) and myself.  I personally evaluated the patient during the encounter  Doug Sou, MD 02/23/13 [phone]

## 2013-02-23 NOTE — Progress Notes (Signed)
Nutrition Brief Note  Patient identified on the Malnutrition Screening Tool (MST) Report  Body mass index is 31.55 kg/(m^2). Patient meets criteria for Obesity based on current BMI. Pt denies any wt loss stating she usually weighs 156 lbs.  Current diet order is Heart healthy, patient is consuming approximately 75% of meals at this time. Pt reports that her appetite is good and she was eating well PTA- usually 4-5 home-cooked meals daily. Pt has no questions or concerns at this time. Labs and medications reviewed.   No nutrition interventions warranted at this time. If nutrition issues arise, please consult RD.   Ian Malkin RD, LDN Inpatient Clinical Dietitian Pager: [phone] After Hours Pager: [phone]

## 2013-02-23 NOTE — Progress Notes (Signed)
Patient did not want to have ultrasound done today, she said that she would have it done outpatient. Patient stated that her husband was coming to pick her up. She said "if i die, I die, If I live, I live". MD was notified. Patient signed AMA papers even though patient was thoroughly educated about having US done here versus outpatient. Setzer, Don Broach

## 2013-02-24 LAB — URINE CULTURE

## 2013-04-20 ENCOUNTER — Ambulatory Visit (HOSPITAL_BASED_OUTPATIENT_CLINIC_OR_DEPARTMENT_OTHER): Payer: Medicare Other | Admitting: Internal Medicine

## 2013-04-20 ENCOUNTER — Ambulatory Visit (HOSPITAL_BASED_OUTPATIENT_CLINIC_OR_DEPARTMENT_OTHER): Payer: Medicare Other

## 2013-04-20 ENCOUNTER — Encounter: Payer: Self-pay | Admitting: Internal Medicine

## 2013-04-20 ENCOUNTER — Telehealth: Payer: Self-pay | Admitting: Internal Medicine

## 2013-04-20 ENCOUNTER — Other Ambulatory Visit (HOSPITAL_BASED_OUTPATIENT_CLINIC_OR_DEPARTMENT_OTHER): Payer: Medicare Other | Admitting: Lab

## 2013-04-20 VITALS — BP 148/90 | HR 85 | Temp 98.9°F | Resp 20 | Ht 59.0 in | Wt 159.9 lb

## 2013-04-20 DIAGNOSIS — Z9484 Stem cells transplant status: Secondary | ICD-10-CM

## 2013-04-20 DIAGNOSIS — C9001 Multiple myeloma in remission: Secondary | ICD-10-CM

## 2013-04-20 LAB — COMPREHENSIVE METABOLIC PANEL (CC13)
Alkaline Phosphatase: 85 U/L (ref 40–150)
BUN: 15.1 mg/dL (ref 7.0–26.0)
Glucose: 80 mg/dl (ref 70–140)
Sodium: 142 mEq/L (ref 136–145)
Total Bilirubin: 0.69 mg/dL (ref 0.20–1.20)
Total Protein: 7.3 g/dL (ref 6.4–8.3)

## 2013-04-20 LAB — CBC WITH DIFFERENTIAL/PLATELET
EOS%: 2.2 % (ref 0.0–7.0)
Eosinophils Absolute: 0.2 10*3/uL (ref 0.0–0.5)
LYMPH%: 36.4 % (ref 14.0–49.7)
MCH: 30.7 pg (ref 25.1–34.0)
MCV: 91.9 fL (ref 79.5–101.0)
MONO%: 9.1 % (ref 0.0–14.0)
NEUT#: 3.6 10*3/uL (ref 1.5–6.5)
Platelets: 163 10*3/uL (ref 145–400)
RBC: 3.68 10*6/uL — ABNORMAL LOW (ref 3.70–5.45)

## 2013-04-20 MED ORDER — ZOLEDRONIC ACID 4 MG/100ML IV SOLN
4.0000 mg | Freq: Once | INTRAVENOUS | Status: AC
  Administered 2013-04-20: 4 mg via INTRAVENOUS
  Filled 2013-04-20: qty 100

## 2013-04-20 NOTE — Telephone Encounter (Signed)
gave pt appt for lab and MD on September , eamiled Michelle regarding chemo

## 2013-04-20 NOTE — Patient Instructions (Addendum)
Zoledronic Acid injection (Hypercalcemia, Oncology) What is this medicine? ZOLEDRONIC ACID (ZOE le dron ik AS id) lowers the amount of calcium loss from bone. It is used to treat too much calcium in your blood from cancer. It is also used to prevent complications of cancer that has spread to the bone. This medicine may be used for other purposes; ask your health care provider or pharmacist if you have questions. What should I tell my health care provider before I take this medicine? They need to know if you have any of these conditions: -aspirin-sensitive asthma -dental disease -kidney disease -an unusual or allergic reaction to zoledronic acid, other medicines, foods, dyes, or preservatives -pregnant or trying to get pregnant -breast-feeding How should I use this medicine? This medicine is for infusion into a vein. It is given by a health care professional in a hospital or clinic setting. Talk to your pediatrician regarding the use of this medicine in children. Special care may be needed. Overdosage: If you think you have taken too much of this medicine contact a poison control center or emergency room at once. NOTE: This medicine is only for you. Do not share this medicine with others. What if I miss a dose? It is important not to miss your dose. Call your doctor or health care professional if you are unable to keep an appointment. What may interact with this medicine? -certain antibiotics given by injection -NSAIDs, medicines for pain and inflammation, like ibuprofen or naproxen -some diuretics like bumetanide, furosemide -teriparatide -thalidomide This list may not describe all possible interactions. Give your health care provider a list of all the medicines, herbs, non-prescription drugs, or dietary supplements you use. Also tell them if you smoke, drink alcohol, or use illegal drugs. Some items may interact with your medicine. What should I watch for while using this medicine? Visit  your doctor or health care professional for regular checkups. It may be some time before you see the benefit from this medicine. Do not stop taking your medicine unless your doctor tells you to. Your doctor may order blood tests or other tests to see how you are doing. Women should inform their doctor if they wish to become pregnant or think they might be pregnant. There is a potential for serious side effects to an unborn child. Talk to your health care professional or pharmacist for more information. You should make sure that you get enough calcium and vitamin D while you are taking this medicine. Discuss the foods you eat and the vitamins you take with your health care professional. Some people who take this medicine have severe bone, joint, and/or muscle pain. This medicine may also increase your risk for a broken thigh bone. Tell your doctor right away if you have pain in your upper leg or groin. Tell your doctor if you have any pain that does not go away or that gets worse. What side effects may I notice from receiving this medicine? Side effects that you should report to your doctor or health care professional as soon as possible: -allergic reactions like skin rash, itching or hives, swelling of the face, lips, or tongue -anxiety, confusion, or depression -breathing problems -changes in vision -feeling faint or lightheaded, falls -jaw burning, cramping, pain -muscle cramps, stiffness, or weakness -trouble passing urine or change in the amount of urine Side effects that usually do not require medical attention (report to your doctor or health care professional if they continue or are bothersome): -bone, joint, or muscle pain -  fever -hair loss -irritation at site where injected -loss of appetite -nausea, vomiting -stomach upset -tired This list may not describe all possible side effects. Call your doctor for medical advice about side effects. You may report side effects to FDA at  1-800-FDA-1088. Where should I keep my medicine? This drug is given in a hospital or clinic and will not be stored at home. NOTE: This sheet is a summary. It may not cover all possible information. If you have questions about this medicine, talk to your doctor, pharmacist, or health care provider.  2013, [employer]vier/[employer] Standard. (04/06/2011 9:06:58 AM)  

## 2013-04-20 NOTE — Patient Instructions (Signed)
Continue on observation with repeat myeloma panel in 3 months 

## 2013-04-20 NOTE — Progress Notes (Signed)
[facility] [phone]   [phone]  OFFICE PROGRESS NOTE  Erlinda Hong, MD [address] [place] [address]  DIAGNOSIS: Multiple myeloma, IgG kappa subtype diagnosed in October 2008   PRIOR THERAPY:  1. Status post radiotherapy to the lower lumbar spine and left pelvic area under the care of Dr. Roselind Messier. The patient received a total dose of 3000 cGy. in 15 fractions between 10/14/2007 through 11/07/2007  2.Status post 6 cycles of systemic chemotherapy with Revlimid and Decadron. Last dose given April 2009.  3. status post autologous peripheral blood stem cell transplant on 04/01/2008 under the care of Dr. Vicente Serene at [facility].   CURRENT THERAPY: Zometa 4 mg IV given every 3 months  INTERVAL HISTORY: Ruth Sanchez 67 y.o. female returns to the clinic today for routine three-month followup visit. The patient is doing fine except for her generalized fatigue and weakness. She also has some rash on the left breast that was evaluated in the past by her primary care physician but she wanted me also to check it. She denied having any significant weight loss or night sweats. She denied having any chest pain but continues to have shortness breath with exertion and no cough or hemoptysis. She had repeat myeloma panel performed earlier today and she is here for evaluation and discussion of her lab results.  MEDICAL HISTORY: Past Medical History  Diagnosis Date  . Cancer   . Hypertension   . Asthma   . Arthritis     ALLERGIES:  has No Known Allergies.  MEDICATIONS:  Current Outpatient Prescriptions  Medication Sig Dispense Refill  . albuterol (PROVENTIL HFA;VENTOLIN HFA) 108 (90 BASE) MCG/ACT inhaler Inhale 2 puffs into the lungs every 6 (six) hours as needed. For shortness of breath.      Marland Kitchen aspirin 325 MG tablet Take 1 tablet (325 mg total) by mouth daily.  30 tablet  3  . carisoprodol (SOMA) 350 MG tablet Take 175 mg by mouth  4 (four) times daily as needed. For muscle pain.      Marland Kitchen diltiazem (CARDIZEM) 60 MG tablet Take 1 tablet (60 mg total) by mouth every 8 (eight) hours.  90 tablet  3  . metoprolol tartrate (LOPRESSOR) 25 MG tablet Take 25 mg by mouth 2 (two) times daily.      . mometasone-formoterol (DULERA) 100-5 MCG/ACT AERO Inhale 2 puffs into the lungs 2 (two) times daily.  1 Inhaler  3  . oxyCODONE-acetaminophen (PERCOCET) 10-325 MG per tablet Take 1 tablet by mouth every 6 (six) hours as needed for pain. For pain.  20 tablet  0   No current facility-administered medications for this visit.    SURGICAL HISTORY:  Past Surgical History  Procedure Laterality Date  . Appendectomy    . Tubal ligation    . Cholecystectomy      REVIEW OF SYSTEMS:  A comprehensive review of systems was negative except for: Constitutional: positive for fatigue Musculoskeletal: positive for arthralgias and muscle weakness   PHYSICAL EXAMINATION: General appearance: alert, cooperative, fatigued and no distress Head: Normocephalic, without obvious abnormality, atraumatic Neck: no adenopathy Lymph nodes: Cervical, supraclavicular, and axillary nodes normal. Resp: clear to auscultation bilaterally Cardio: regular rate and rhythm, S1, S2 normal, no murmur, click, rub or gallop GI: soft, non-tender; bowel sounds normal; no masses,  no organomegaly Extremities: extremities normal, atraumatic, no cyanosis or edema Breast exam done with the chaperone of my nurse Kathlee Nations showed  healing vesicles on the left breast questionable for healing shingles.  ECOG PERFORMANCE STATUS: 2 - Symptomatic, <50% confined to bed  Blood pressure 148/90, pulse 85, temperature 98.9 F (37.2 C), temperature source Oral, resp. rate 20, height 4\' 11"  (1.499 m), weight 159 lb 14.4 oz (72.53 kg).  LABORATORY DATA: Lab Results  Component Value Date   WBC 7.0 04/20/2013   HGB 11.3* 04/20/2013   HCT 33.8* 04/20/2013   MCV 91.9 04/20/2013   PLT 163  04/20/2013      Chemistry      Component Value Date/Time   NA 140 02/23/2013 0506   NA 143 01/19/2013 1058   K 3.0* 02/23/2013 0506   K 3.1* 01/19/2013 1058   CL 103 02/23/2013 0506   CL 104 01/19/2013 1058   CO2 26 02/23/2013 0506   CO2 26 01/19/2013 1058   BUN 9 02/23/2013 0506   BUN 9.9 01/19/2013 1058   CREATININE 0.45* 02/23/2013 0506   CREATININE 0.7 01/19/2013 1058      Component Value Date/Time   CALCIUM 9.0 02/23/2013 0506   CALCIUM 9.1 01/19/2013 1058   ALKPHOS 96 01/19/2013 1058   ALKPHOS 118* 10/20/2012 1704   AST 21 01/19/2013 1058   AST 18 10/20/2012 1704   ALT 14 01/19/2013 1058   ALT 13 10/20/2012 1704   BILITOT 0.62 01/19/2013 1058   BILITOT 0.4 10/20/2012 1704       RADIOGRAPHIC STUDIES: No results found.  ASSESSMENT AND PLAN: This is a very pleasant 67 years old African American female with history of multiple myeloma status post systemic chemotherapy with Revlimid and Decadron followed by this peripheral blood stem cell transplant and has been observation since June of 2009 with no significant evidence for disease progression. Her myeloma panel is still pending today. I recommended for her to continue on observation for now with repeat myeloma panel in 3 months. The patient will continue on Zometa treatment every 3 months as scheduled. I will call her sooner if there is any significant abnormalities in the myeloma panel performed today.  All questions were answered. The patient knows to call the clinic with any problems, questions or concerns. We can certainly see the patient much sooner if necessary.

## 2013-04-21 ENCOUNTER — Telehealth: Payer: Self-pay | Admitting: *Deleted

## 2013-04-21 NOTE — Telephone Encounter (Signed)
Per staff message and POF I have scheduled appts.  JMW  

## 2013-04-22 ENCOUNTER — Telehealth: Payer: Self-pay | Admitting: Internal Medicine

## 2013-04-22 LAB — KAPPA/LAMBDA LIGHT CHAINS
Kappa free light chain: 2.22 mg/dL — ABNORMAL HIGH (ref 0.33–1.94)
Kappa:Lambda Ratio: 0.71 (ref 0.26–1.65)

## 2013-04-22 LAB — SPEP & IFE WITH QIG
Alpha-2-Globulin: 13 % — ABNORMAL HIGH (ref 7.1–11.8)
Beta 2: 5 % (ref 3.2–6.5)
Beta Globulin: 5.8 % (ref 4.7–7.2)
Gamma Globulin: 19.7 % — ABNORMAL HIGH (ref 11.1–18.8)
IgG (Immunoglobin G), Serum: 1390 mg/dL (ref 690–1700)

## 2013-04-22 LAB — BETA 2 MICROGLOBULIN, SERUM: Beta-2 Microglobulin: 1.74 mg/L — ABNORMAL HIGH (ref 1.01–1.73)

## 2013-04-22 NOTE — Telephone Encounter (Signed)
Calledf pt and left message regarding labs, ML and chemo for September 2014

## 2013-07-17 ENCOUNTER — Other Ambulatory Visit (HOSPITAL_BASED_OUTPATIENT_CLINIC_OR_DEPARTMENT_OTHER): Payer: Medicare Other | Admitting: Lab

## 2013-07-17 DIAGNOSIS — C9001 Multiple myeloma in remission: Secondary | ICD-10-CM

## 2013-07-17 LAB — COMPREHENSIVE METABOLIC PANEL (CC13)
ALT: 15 U/L (ref 0–55)
AST: 32 U/L (ref 5–34)
Chloride: 108 mEq/L (ref 98–109)
Creatinine: 0.8 mg/dL (ref 0.6–1.1)
Glucose: 80 mg/dl (ref 70–140)
Sodium: 142 mEq/L (ref 136–145)
Total Bilirubin: 1.32 mg/dL — ABNORMAL HIGH (ref 0.20–1.20)
Total Protein: 7.8 g/dL (ref 6.4–8.3)

## 2013-07-17 LAB — CBC WITH DIFFERENTIAL/PLATELET
BASO%: 0.2 % (ref 0.0–2.0)
Eosinophils Absolute: 0.1 10*3/uL (ref 0.0–0.5)
LYMPH%: 30 % (ref 14.0–49.7)
MCHC: 33.1 g/dL (ref 31.5–36.0)
MCV: 91.3 fL (ref 79.5–101.0)
MONO%: 9.5 % (ref 0.0–14.0)
Platelets: 142 10*3/uL — ABNORMAL LOW (ref 145–400)
RBC: 4.06 10*6/uL (ref 3.70–5.45)

## 2013-07-17 NOTE — Progress Notes (Signed)
Quick Note:  Call patient with the result and order K DUR 20 meq po qd X 7 ______

## 2013-07-20 ENCOUNTER — Telehealth: Payer: Self-pay | Admitting: Medical Oncology

## 2013-07-20 DIAGNOSIS — E876 Hypokalemia: Secondary | ICD-10-CM

## 2013-07-20 LAB — KAPPA/LAMBDA LIGHT CHAINS
Kappa:Lambda Ratio: 0.79 (ref 0.26–1.65)
Lambda Free Lght Chn: 2.63 mg/dL (ref 0.57–2.63)

## 2013-07-20 LAB — IGG, IGA, IGM
IgA: 212 mg/dL (ref 69–380)
IgM, Serum: 148 mg/dL (ref 52–322)

## 2013-07-20 MED ORDER — POTASSIUM CHLORIDE CRYS ER 20 MEQ PO TBCR: 20.0000 meq | EXTENDED_RELEASE_TABLET | Freq: Every day | ORAL | Status: DC

## 2013-07-20 NOTE — Telephone Encounter (Signed)
Called to pt and e=scribed kdur  rx to pharmacy.

## 2013-07-20 NOTE — Telephone Encounter (Signed)
Message copied by Charma Igo on Mon Jul 20, 2013  5:13 PM ------      Message from: Si Gaul      Created: Fri Jul 17, 2013  2:50 PM       Call patient with the result and order K DUR 20 meq po qd X 7 ------

## 2013-07-21 ENCOUNTER — Other Ambulatory Visit: Payer: Medicare Other | Admitting: Lab

## 2013-07-21 ENCOUNTER — Ambulatory Visit (HOSPITAL_BASED_OUTPATIENT_CLINIC_OR_DEPARTMENT_OTHER): Payer: Medicare Other

## 2013-07-21 ENCOUNTER — Ambulatory Visit (HOSPITAL_BASED_OUTPATIENT_CLINIC_OR_DEPARTMENT_OTHER): Payer: Medicare Other | Admitting: Physician Assistant

## 2013-07-21 ENCOUNTER — Telehealth: Payer: Self-pay | Admitting: Internal Medicine

## 2013-07-21 ENCOUNTER — Encounter: Payer: Self-pay | Admitting: Physician Assistant

## 2013-07-21 ENCOUNTER — Telehealth: Payer: Self-pay | Admitting: *Deleted

## 2013-07-21 VITALS — BP 154/76 | HR 85 | Temp 98.8°F | Resp 18 | Ht 59.0 in | Wt 154.6 lb

## 2013-07-21 DIAGNOSIS — C9001 Multiple myeloma in remission: Secondary | ICD-10-CM

## 2013-07-21 DIAGNOSIS — C9000 Multiple myeloma not having achieved remission: Secondary | ICD-10-CM

## 2013-07-21 DIAGNOSIS — R0609 Other forms of dyspnea: Secondary | ICD-10-CM

## 2013-07-21 DIAGNOSIS — R5383 Other fatigue: Secondary | ICD-10-CM

## 2013-07-21 DIAGNOSIS — L97909 Non-pressure chronic ulcer of unspecified part of unspecified lower leg with unspecified severity: Secondary | ICD-10-CM

## 2013-07-21 DIAGNOSIS — R5381 Other malaise: Secondary | ICD-10-CM

## 2013-07-21 DIAGNOSIS — R0989 Other specified symptoms and signs involving the circulatory and respiratory systems: Secondary | ICD-10-CM

## 2013-07-21 MED ORDER — ZOLEDRONIC ACID 4 MG/100ML IV SOLN
4.0000 mg | Freq: Once | INTRAVENOUS | Status: AC
  Administered 2013-07-21: 4 mg via INTRAVENOUS
  Filled 2013-07-21: qty 100

## 2013-07-21 MED ORDER — CEPHALEXIN 500 MG PO CAPS: 500.0000 mg | ORAL_CAPSULE | Freq: Three times a day (TID) | ORAL | Status: DC

## 2013-07-21 MED ORDER — SODIUM CHLORIDE 0.9 % IV SOLN
Freq: Once | INTRAVENOUS | Status: AC
  Administered 2013-07-21: 16:00:00 via INTRAVENOUS

## 2013-07-21 NOTE — Patient Instructions (Addendum)
Take the antibiotic as prescribed for the infected area on your leg Follow up in 3 months with repeat protein studies drawn a few days before your follow up visit with Dr. Arbutus Ped.  You will be due for your Zometa infusion when you return in 3 months

## 2013-07-21 NOTE — Progress Notes (Addendum)
[facility] Telephone:([phone]   Fax:(336) [phone]  SHARED VISIT PROGRESS NOTE  Ruth Hong, MD [address] [place] [address]  DIAGNOSIS: Multiple myeloma, IgG kappa subtype diagnosed in October 2008   PRIOR THERAPY:  1. Status post radiotherapy to the lower lumbar spine and left pelvic area under the care of Dr. Roselind Messier. The patient received a total dose of 3000 cGy. in 15 fractions between 10/14/2007 through 11/07/2007  2.Status post 6 cycles of systemic chemotherapy with Revlimid and Decadron. Last dose given April 2009.  3. status post autologous peripheral blood stem cell transplant on 04/01/2008 under the care of Dr. Vicente Serene at [facility].   CURRENT THERAPY: Zometa 4 mg IV given every 3 months  INTERVAL HISTORY: Ruth Sanchez 67 y.o. female returns to the clinic today for routine three-month followup visit. The patient is doing fine except for her generalized fatigue and weakness. She complains of pain in her left leg which is a constant for her. She also has a sore place on her right leg has been present for about a week or perhaps a week and half. She does not recall any specific trauma or  insect bites that may occur to the right leg. She believes the pain in the left leg is due to her multiple myeloma. She had repeat protein studies done and is here to discuss those results as well as to receive her every three-month Zometa infusion. She voiced no other specific complaints today. Her potassium level is slightly low however potassium supplementation has been called to her pharmacy of record. She plans to pick that up today.  She denied having any significant weight loss or night sweats. She denied having any chest pain but continues to have shortness breath with exertion and no cough or hemoptysis.   MEDICAL HISTORY: Past Medical History  Diagnosis Date  . Cancer   . Hypertension   . Asthma   . Arthritis     ALLERGIES:  has  No Known Allergies.  MEDICATIONS:  Current Outpatient Prescriptions  Medication Sig Dispense Refill  . albuterol (PROVENTIL HFA;VENTOLIN HFA) 108 (90 BASE) MCG/ACT inhaler Inhale 2 puffs into the lungs every 6 (six) hours as needed. For shortness of breath.      Marland Kitchen aspirin 325 MG tablet Take 1 tablet (325 mg total) by mouth daily.  30 tablet  3  . carisoprodol (SOMA) 350 MG tablet Take 175 mg by mouth 4 (four) times daily as needed. For muscle pain.      . cephALEXin (KEFLEX) 500 MG capsule Take 1 capsule (500 mg total) by mouth 3 (three) times daily.  21 capsule  0  . diltiazem (CARDIZEM) 60 MG tablet Take 1 tablet (60 mg total) by mouth every 8 (eight) hours.  90 tablet  3  . metoprolol tartrate (LOPRESSOR) 25 MG tablet Take 25 mg by mouth 2 (two) times daily.      . mometasone-formoterol (DULERA) 100-5 MCG/ACT AERO Inhale 2 puffs into the lungs 2 (two) times daily.  1 Inhaler  3  . oxyCODONE-acetaminophen (PERCOCET) 10-325 MG per tablet Take 1 tablet by mouth every 6 (six) hours as needed for pain. For pain.  20 tablet  0  . potassium chloride SA (K-DUR,KLOR-CON) 20 MEQ tablet Take 1 tablet (20 mEq total) by mouth daily.  7 tablet  0   No current facility-administered medications for this visit.    SURGICAL HISTORY:  Past Surgical History  Procedure Laterality Date  . Appendectomy    . Tubal ligation    . Cholecystectomy      REVIEW OF SYSTEMS:  A comprehensive review of systems was negative except for: Constitutional: positive for fatigue Cardiovascular: positive for dyspnea Musculoskeletal: positive for arthralgias, bone pain and muscle weakness   PHYSICAL EXAMINATION: General appearance: alert, cooperative, fatigued and no distress Head: Normocephalic, without obvious abnormality, atraumatic Neck: no adenopathy Lymph nodes: Cervical, supraclavicular, and axillary nodes normal. Resp: clear to auscultation bilaterally Cardio: regular rate and rhythm, S1, S2 normal, no murmur,  click, rub or gallop GI: soft, non-tender; bowel sounds normal; no masses,  no organomegaly Extremities: extremities normal, atraumatic, no cyanosis or edema Skin: Right medial mid calf reveals a central area of eschar with approximately one and a half centimeter area of erythema with streaking down towards the lower third of the ankle there is mild warmth there is no purulent drainage present. This area is concerning for cellulitis  ECOG PERFORMANCE STATUS: 2 - Symptomatic, <50% confined to bed  Blood pressure 154/76, pulse 85, temperature 98.8 F (37.1 C), temperature source Oral, resp. rate 18, height 4\' 11"  (1.499 m), weight 154 lb 9.6 oz (70.126 kg).  LABORATORY DATA: Lab Results  Component Value Date   WBC 8.5 07/17/2013   HGB 12.3 07/17/2013   HCT 37.0 07/17/2013   MCV 91.3 07/17/2013   PLT 142* 07/17/2013      Chemistry      Component Value Date/Time   NA 142 07/17/2013 1408   NA 140 02/23/2013 0506   K 3.2* 07/17/2013 1408   K 3.0* 02/23/2013 0506   CL 103 02/23/2013 0506   CL 104 01/19/2013 1058   CO2 22 07/17/2013 1408   CO2 26 02/23/2013 0506   BUN 20.6 07/17/2013 1408   BUN 9 02/23/2013 0506   CREATININE 0.8 07/17/2013 1408   CREATININE 0.45* 02/23/2013 0506      Component Value Date/Time   CALCIUM 9.7 07/17/2013 1408   CALCIUM 9.0 02/23/2013 0506   ALKPHOS 83 07/17/2013 1408   ALKPHOS 118* 10/20/2012 1704   AST 32 07/17/2013 1408   AST 18 10/20/2012 1704   ALT 15 07/17/2013 1408   ALT 13 10/20/2012 1704   BILITOT 1.32* 07/17/2013 1408   BILITOT 0.4 10/20/2012 1704     Patient's myeloma panel is relatively stable the IgG Ruth Sanchez 1430, IgA 212, IgM 148. The beta-2 microglobulin was slightly increased at 1.84, kappa free light chain slightly decreased from previous level of 2.2 to now down to 2.09 lambda free light chain now 2.63 kappa lambda ratio 0.79  RADIOGRAPHIC STUDIES: No results found.  ASSESSMENT AND PLAN: This is a very pleasant 67 years old African American female with  history of multiple myeloma status post systemic chemotherapy with Revlimid and Decadron followed by this peripheral blood stem cell transplant and has been observation since June of 2009 with no significant evidence for disease progression. Patient was discussed with also seen by Dr. Arbutus Ped. She will continue on observation for now with repeat myeloma panel in 3 months. The patient will continue on Zometa treatment every 3 months as scheduled. She will receive her Zometa today. In the right lower extremity lesion is concerning for Zometa we will place her on a seven-day course of Keflex at 500 mg by mouth 3 times daily. This prescription was sent to her pharmacy of record via E. scribed. Concepcion Elk, Karlissa Aron E, PA-C   All questions were answered. The  patient knows to call the clinic with any problems, questions or concerns. We can certainly see the patient much sooner if necessary.   ADDENDUM: Hematology/Oncology Attending: I have a face to face encounter with the patient. I recommended her care plan. This is a very pleasant 67 years old [place] American female with history of multiple myeloma currently on observation in addition to Zometa infusion every 3 months. The patient is doing fine and she has no evidence for disease progression on his recent myeloma panel. I recommended for the patient to continue on observation for now with repeat myeloma panel in 3 months. She was advised to call immediately if she has any concerning symptoms in the interval. She has also a small ulcer on the  shin of the right leg suspicious for early cellulitis and the patient would be treated with a course of Keflex 500 mg by mouth 3 times a day for 7 days. She was advised to call immediately if she has any concerning symptoms in the interval. Lajuana Matte., MD 07/21/2013

## 2013-07-21 NOTE — Patient Instructions (Addendum)
Zoledronic Acid injection (Hypercalcemia, Oncology) What is this medicine? ZOLEDRONIC ACID (ZOE le dron ik AS id) lowers the amount of calcium loss from bone. It is used to treat too much calcium in your blood from cancer. It is also used to prevent complications of cancer that has spread to the bone. This medicine may be used for other purposes; ask your health care provider or pharmacist if you have questions. What should I tell my health care provider before I take this medicine? They need to know if you have any of these conditions: -aspirin-sensitive asthma -dental disease -kidney disease -an unusual or allergic reaction to zoledronic acid, other medicines, foods, dyes, or preservatives -pregnant or trying to get pregnant -breast-feeding How should I use this medicine? This medicine is for infusion into a vein. It is given by a health care professional in a hospital or clinic setting. Talk to your pediatrician regarding the use of this medicine in children. Special care may be needed. Overdosage: If you think you have taken too much of this medicine contact a poison control center or emergency room at once. NOTE: This medicine is only for you. Do not share this medicine with others. What if I miss a dose? It is important not to miss your dose. Call your doctor or health care professional if you are unable to keep an appointment. What may interact with this medicine? -certain antibiotics given by injection -NSAIDs, medicines for pain and inflammation, like ibuprofen or naproxen -some diuretics like bumetanide, furosemide -teriparatide -thalidomide This list may not describe all possible interactions. Give your health care provider a list of all the medicines, herbs, non-prescription drugs, or dietary supplements you use. Also tell them if you smoke, drink alcohol, or use illegal drugs. Some items may interact with your medicine. What should I watch for while using this medicine? Visit  your doctor or health care professional for regular checkups. It may be some time before you see the benefit from this medicine. Do not stop taking your medicine unless your doctor tells you to. Your doctor may order blood tests or other tests to see how you are doing. Women should inform their doctor if they wish to become pregnant or think they might be pregnant. There is a potential for serious side effects to an unborn child. Talk to your health care professional or pharmacist for more information. You should make sure that you get enough calcium and vitamin D while you are taking this medicine. Discuss the foods you eat and the vitamins you take with your health care professional. Some people who take this medicine have severe bone, joint, and/or muscle pain. This medicine may also increase your risk for a broken thigh bone. Tell your doctor right away if you have pain in your upper leg or groin. Tell your doctor if you have any pain that does not go away or that gets worse. What side effects may I notice from receiving this medicine? Side effects that you should report to your doctor or health care professional as soon as possible: -allergic reactions like skin rash, itching or hives, swelling of the face, lips, or tongue -anxiety, confusion, or depression -breathing problems -changes in vision -feeling faint or lightheaded, falls -jaw burning, cramping, pain -muscle cramps, stiffness, or weakness -trouble passing urine or change in the amount of urine Side effects that usually do not require medical attention (report to your doctor or health care professional if they continue or are bothersome): -bone, joint, or muscle pain -  fever -hair loss -irritation at site where injected -loss of appetite -nausea, vomiting -stomach upset -tired This list may not describe all possible side effects. Call your doctor for medical advice about side effects. You may report side effects to FDA at  1-800-FDA-1088. Where should I keep my medicine? This drug is given in a hospital or clinic and will not be stored at home. NOTE: This sheet is a summary. It may not cover all possible information. If you have questions about this medicine, talk to your doctor, pharmacist, or health care provider.  2013, [employer]vier/[employer] Standard. (04/06/2011 9:06:58 AM)  

## 2013-07-21 NOTE — Telephone Encounter (Signed)
Per staff message and POF I have scheduled appts.  JMW  

## 2013-07-21 NOTE — Telephone Encounter (Signed)
gv and printed appt sched and avs for pt for Dec...emailed MW to add tx.

## 2013-10-08 ENCOUNTER — Telehealth: Payer: Self-pay

## 2013-10-08 NOTE — Telephone Encounter (Signed)
Message from Luck that the pt called and cancelled (lab, MD, infusion) and will call back to reschedule.

## 2013-10-09 ENCOUNTER — Other Ambulatory Visit: Payer: Medicare Other

## 2013-10-09 ENCOUNTER — Telehealth: Payer: Self-pay | Admitting: *Deleted

## 2013-10-09 NOTE — Telephone Encounter (Signed)
PT. STATES SHE HAS "SHINGLES" ON HER BREASTS. SHE SAW DR.KPEGLO TODAY BUT DID NOT SHOW HIM THE "SHINGLES". INFORMED PT. THAT AT THIS LATE HOUR SHE MAY NOT GET A RETURN CALL CONCERNING HER SCHEDULE. SHE VOICES UNDERSTANDING. THIS NOTE TO DR.MOHAMED'S NURSE, STEPHANIE JOHNSON,RN.

## 2013-10-12 NOTE — Telephone Encounter (Signed)
Spoke with Dr Donnald Garre, pt needs to contact PCP regarding this.  Informed patient.  Pt also requesting to r/s appt she cancelled this week.  Onc tx schedule filled out.  SLJ

## 2013-10-13 ENCOUNTER — Telehealth: Payer: Self-pay | Admitting: *Deleted

## 2013-10-13 ENCOUNTER — Ambulatory Visit: Payer: Medicare Other

## 2013-10-13 ENCOUNTER — Ambulatory Visit: Payer: Medicare Other | Admitting: Internal Medicine

## 2013-10-13 NOTE — Telephone Encounter (Signed)
Per staff message and POF I have scheduled appts.  JMW  

## 2013-10-13 NOTE — Telephone Encounter (Signed)
sw pt spouse gv appt for labs 10/20/13 @10 :30am. Also gv appts for 10/26/13 w/labs@ 2:45p, ov@ 3:15p ,and tx to follow. i emailed MW to add the tx...td

## 2013-10-20 ENCOUNTER — Other Ambulatory Visit: Payer: Medicare Other

## 2013-10-26 ENCOUNTER — Ambulatory Visit (HOSPITAL_BASED_OUTPATIENT_CLINIC_OR_DEPARTMENT_OTHER): Payer: Medicare Other

## 2013-10-26 ENCOUNTER — Encounter (INDEPENDENT_AMBULATORY_CARE_PROVIDER_SITE_OTHER): Payer: Self-pay

## 2013-10-26 ENCOUNTER — Encounter: Payer: Self-pay | Admitting: Internal Medicine

## 2013-10-26 ENCOUNTER — Telehealth: Payer: Self-pay | Admitting: Internal Medicine

## 2013-10-26 ENCOUNTER — Other Ambulatory Visit (HOSPITAL_BASED_OUTPATIENT_CLINIC_OR_DEPARTMENT_OTHER): Payer: Medicare Other

## 2013-10-26 ENCOUNTER — Ambulatory Visit (HOSPITAL_BASED_OUTPATIENT_CLINIC_OR_DEPARTMENT_OTHER): Payer: Medicare Other | Admitting: Internal Medicine

## 2013-10-26 VITALS — BP 182/86 | HR 91 | Temp 98.5°F | Resp 18 | Ht 59.0 in | Wt 150.4 lb

## 2013-10-26 DIAGNOSIS — C9001 Multiple myeloma in remission: Secondary | ICD-10-CM

## 2013-10-26 DIAGNOSIS — C9000 Multiple myeloma not having achieved remission: Secondary | ICD-10-CM

## 2013-10-26 LAB — CBC WITH DIFFERENTIAL/PLATELET
BASO%: 0.3 % (ref 0.0–2.0)
Basophils Absolute: 0 10*3/uL (ref 0.0–0.1)
EOS ABS: 0.1 10*3/uL (ref 0.0–0.5)
EOS%: 1 % (ref 0.0–7.0)
HCT: 36.7 % (ref 34.8–46.6)
HGB: 12 g/dL (ref 11.6–15.9)
LYMPH#: 3 10*3/uL (ref 0.9–3.3)
LYMPH%: 41.4 % (ref 14.0–49.7)
MCH: 30.5 pg (ref 25.1–34.0)
MCHC: 32.8 g/dL (ref 31.5–36.0)
MCV: 93.1 fL (ref 79.5–101.0)
MONO#: 0.6 10*3/uL (ref 0.1–0.9)
MONO%: 8.1 % (ref 0.0–14.0)
NEUT%: 49.2 % (ref 38.4–76.8)
NEUTROS ABS: 3.6 10*3/uL (ref 1.5–6.5)
PLATELETS: 144 10*3/uL — AB (ref 145–400)
RBC: 3.94 10*6/uL (ref 3.70–5.45)
RDW: 14.3 % (ref 11.2–14.5)
WBC: 7.3 10*3/uL (ref 3.9–10.3)

## 2013-10-26 LAB — BASIC METABOLIC PANEL (CC13)
Anion Gap: 11 mEq/L (ref 3–11)
BUN: 6 mg/dL — AB (ref 7.0–26.0)
CO2: 26 mEq/L (ref 22–29)
CREATININE: 0.6 mg/dL (ref 0.6–1.1)
Calcium: 9 mg/dL (ref 8.4–10.4)
Chloride: 104 mEq/L (ref 98–109)
GLUCOSE: 81 mg/dL (ref 70–140)
POTASSIUM: 3 meq/L — AB (ref 3.5–5.1)
Sodium: 142 mEq/L (ref 136–145)

## 2013-10-26 LAB — LACTATE DEHYDROGENASE (CC13): LDH: 211 U/L (ref 125–245)

## 2013-10-26 MED ORDER — ZOLEDRONIC ACID 4 MG/100ML IV SOLN
4.0000 mg | Freq: Once | INTRAVENOUS | Status: AC
  Administered 2013-10-26: 4 mg via INTRAVENOUS
  Filled 2013-10-26: qty 100

## 2013-10-26 NOTE — Telephone Encounter (Signed)
appts made per 10/26/13 Email MW to add tx on 04/06 AVS and CAL given to pt shhj

## 2013-10-26 NOTE — Progress Notes (Signed)
Quick Note:  Call patient with the result and order K Dur 20 meq po qd X 7 days ______ 

## 2013-10-26 NOTE — Patient Instructions (Signed)
Followup visit in 3 months with repeat myeloma panel.

## 2013-10-26 NOTE — Patient Instructions (Signed)

## 2013-10-26 NOTE — Progress Notes (Signed)
[name] Telephone:(336) [phone]   Fax:(336) [place], [place] [address]  DIAGNOSIS: Multiple myeloma, IgG kappa subtype diagnosed in October 2008   PRIOR THERAPY:  1. Status post radiotherapy to the lower lumbar spine and left pelvic area under the care of Dr. Sondra Come. The patient received a total dose of 3000 cGy. in 15 fractions between 10/14/2007 through 11/07/2007  2.Status post 6 cycles of systemic chemotherapy with Revlimid and Decadron. Last dose given April 2009.  3. status post autologous peripheral blood stem cell transplant on 04/01/2008 under the care of Dr. Valarie Merino at [facility].   CURRENT THERAPY: Zometa 4 mg IV given every 3 months  INTERVAL HISTORY: Ruth Sanchez 68 y.o. female returns to the clinic today for routine three-month followup visit. He is feeling fine today with no specific complaints. She denied having any nausea or vomiting. She denied having any significant weight loss or night sweats. She denied having any chest pain but continues to have shortness breath with exertion and no cough or hemoptysis. She had repeat myeloma panel performed earlier today and she is here for evaluation and discussion of her lab results.  MEDICAL HISTORY: Past Medical History  Diagnosis Date  . Cancer   . Hypertension   . Asthma   . Arthritis     ALLERGIES:  has No Known Allergies.  MEDICATIONS:  Current Outpatient Prescriptions  Medication Sig Dispense Refill  . albuterol (PROVENTIL HFA;VENTOLIN HFA) 108 (90 BASE) MCG/ACT inhaler Inhale 2 puffs into the lungs every 6 (six) hours as needed. For shortness of breath.      Marland Kitchen aspirin 325 MG tablet Take 1 tablet (325 mg total) by mouth daily.  30 tablet  3  . carisoprodol (SOMA) 350 MG tablet Take 175 mg by mouth 4 (four) times daily as needed. For muscle pain.      . metoprolol tartrate (LOPRESSOR) 25 MG tablet Take 25 mg by  mouth 2 (two) times daily.      Marland Kitchen oxyCODONE-acetaminophen (PERCOCET) 10-325 MG per tablet Take 1 tablet by mouth every 6 (six) hours as needed for pain. For pain.  20 tablet  0   No current facility-administered medications for this visit.    SURGICAL HISTORY:  Past Surgical History  Procedure Laterality Date  . Appendectomy    . Tubal ligation    . Cholecystectomy      REVIEW OF SYSTEMS:  A comprehensive review of systems was negative except for: Constitutional: positive for fatigue Musculoskeletal: positive for arthralgias and muscle weakness   PHYSICAL EXAMINATION: General appearance: alert, cooperative, fatigued and no distress Head: Normocephalic, without obvious abnormality, atraumatic Neck: no adenopathy Lymph nodes: Cervical, supraclavicular, and axillary nodes normal. Resp: clear to auscultation bilaterally Cardio: regular rate and rhythm, S1, S2 normal, no murmur, click, rub or gallop GI: soft, non-tender; bowel sounds normal; no masses,  no organomegaly Extremities: extremities normal, atraumatic, no cyanosis or edema Breast exam done with the chaperone of my nurse Rudene Anda showed healing vesicles on the left breast questionable for healing shingles.  ECOG PERFORMANCE STATUS: 2 - Symptomatic, <50% confined to bed  Blood pressure 182/86, pulse 91, temperature 98.5 F (36.9 C), temperature source Oral, resp. rate 18, height '4\' 11"'  (1.499 m), weight 150 lb 6.4 oz (68.221 kg), SpO2 100.00%.  LABORATORY DATA: Lab Results  Component Value Date   WBC 7.3 10/26/2013   HGB 12.0  10/26/2013   HCT 36.7 10/26/2013   MCV 93.1 10/26/2013   PLT 144* 10/26/2013      Chemistry      Component Value Date/Time   NA 142 07/17/2013 1408   NA 140 02/23/2013 0506   K 3.2* 07/17/2013 1408   K 3.0* 02/23/2013 0506   CL 103 02/23/2013 0506   CL 104 01/19/2013 1058   CO2 22 07/17/2013 1408   CO2 26 02/23/2013 0506   BUN 20.6 07/17/2013 1408   BUN 9 02/23/2013 0506   CREATININE 0.8 07/17/2013 1408    CREATININE 0.45* 02/23/2013 0506      Component Value Date/Time   CALCIUM 9.7 07/17/2013 1408   CALCIUM 9.0 02/23/2013 0506   ALKPHOS 83 07/17/2013 1408   ALKPHOS 118* 10/20/2012 1704   AST 32 07/17/2013 1408   AST 18 10/20/2012 1704   ALT 15 07/17/2013 1408   ALT 13 10/20/2012 1704   BILITOT 1.32* 07/17/2013 1408   BILITOT 0.4 10/20/2012 1704       RADIOGRAPHIC STUDIES: No results found.  ASSESSMENT AND PLAN: This is a very pleasant 68 years old African American female with history of multiple myeloma status post systemic chemotherapy with Revlimid and Decadron followed by this peripheral blood stem cell transplant and has been observation since June of 2009 with no significant evidence for disease progression. Her myeloma panel is still pending today, but the previous ones 3 months ago was unremarkable for any disease progression. I recommended for her to continue on observation for now with repeat myeloma panel in 3 months. The patient will continue on Zometa treatment every 3 months as scheduled. I will call her sooner if there is any significant abnormalities in the myeloma panel performed today.  All questions were answered. The patient knows to call the clinic with any problems, questions or concerns. We can certainly see the patient much sooner if necessary.

## 2013-10-27 ENCOUNTER — Telehealth: Payer: Self-pay | Admitting: *Deleted

## 2013-10-27 DIAGNOSIS — E876 Hypokalemia: Secondary | ICD-10-CM

## 2013-10-27 LAB — IGG, IGA, IGM
IGG (IMMUNOGLOBIN G), SERUM: 1570 mg/dL (ref 690–1700)
IgA: 259 mg/dL (ref 69–380)
IgM, Serum: 181 mg/dL (ref 52–322)

## 2013-10-27 LAB — KAPPA/LAMBDA LIGHT CHAINS
KAPPA LAMBDA RATIO: 0.74 (ref 0.26–1.65)
Kappa free light chain: 1.92 mg/dL (ref 0.33–1.94)
Lambda Free Lght Chn: 2.59 mg/dL (ref 0.57–2.63)

## 2013-10-27 LAB — BETA 2 MICROGLOBULIN, SERUM: BETA 2 MICROGLOBULIN: 1.34 mg/L (ref 1.01–1.73)

## 2013-10-27 MED ORDER — POTASSIUM CHLORIDE CRYS ER 20 MEQ PO TBCR: 20.0000 meq | EXTENDED_RELEASE_TABLET | Freq: Every day | ORAL | Status: DC

## 2013-10-27 NOTE — Telephone Encounter (Signed)
Message copied by Norma Fredrickson on Tue Oct 27, 2013 10:07 AM ------      Message from: Britt Bottom      Created: Tue Oct 27, 2013  9:17 AM                   ----- Message -----         From: Curt Bears, MD         Sent: 10/26/2013   6:28 PM           To: Carlton Adam, PA-C, #            Call patient with the result and order K Dur 20 meq po qd X 7 days ------

## 2013-10-27 NOTE — Telephone Encounter (Signed)
Per staff message and POF I have scheduled appts.  JMW  

## 2013-10-27 NOTE — Telephone Encounter (Signed)
Called and informed patient of low potassium and a prescription for K Dur 20 meq will be sent to her pharmacy ([place]).  Per Dr. Julien Nordmann. Patient verbalized understanding.

## 2013-11-05 DIAGNOSIS — M818 Other osteoporosis without current pathological fracture: Secondary | ICD-10-CM | POA: Diagnosis not present

## 2013-11-05 DIAGNOSIS — M129 Arthropathy, unspecified: Secondary | ICD-10-CM | POA: Diagnosis not present

## 2013-11-05 DIAGNOSIS — M549 Dorsalgia, unspecified: Secondary | ICD-10-CM | POA: Diagnosis not present

## 2013-11-05 DIAGNOSIS — C9002 Multiple myeloma in relapse: Secondary | ICD-10-CM | POA: Diagnosis not present

## 2013-12-03 DIAGNOSIS — I10 Essential (primary) hypertension: Secondary | ICD-10-CM | POA: Diagnosis not present

## 2013-12-03 DIAGNOSIS — C9002 Multiple myeloma in relapse: Secondary | ICD-10-CM | POA: Diagnosis not present

## 2013-12-03 DIAGNOSIS — M129 Arthropathy, unspecified: Secondary | ICD-10-CM | POA: Diagnosis not present

## 2013-12-03 DIAGNOSIS — F411 Generalized anxiety disorder: Secondary | ICD-10-CM | POA: Diagnosis not present

## 2013-12-03 DIAGNOSIS — M818 Other osteoporosis without current pathological fracture: Secondary | ICD-10-CM | POA: Diagnosis not present

## 2013-12-30 DIAGNOSIS — M818 Other osteoporosis without current pathological fracture: Secondary | ICD-10-CM | POA: Diagnosis not present

## 2013-12-30 DIAGNOSIS — C9002 Multiple myeloma in relapse: Secondary | ICD-10-CM | POA: Diagnosis not present

## 2013-12-30 DIAGNOSIS — F411 Generalized anxiety disorder: Secondary | ICD-10-CM | POA: Diagnosis not present

## 2013-12-30 DIAGNOSIS — I10 Essential (primary) hypertension: Secondary | ICD-10-CM | POA: Diagnosis not present

## 2013-12-30 DIAGNOSIS — M129 Arthropathy, unspecified: Secondary | ICD-10-CM | POA: Diagnosis not present

## 2014-01-07 ENCOUNTER — Telehealth: Payer: Self-pay | Admitting: Internal Medicine

## 2014-01-07 NOTE — Telephone Encounter (Signed)
returned pt call and advised on March and April appts...pt ok and aware

## 2014-01-18 ENCOUNTER — Other Ambulatory Visit (HOSPITAL_BASED_OUTPATIENT_CLINIC_OR_DEPARTMENT_OTHER): Payer: Medicare Other

## 2014-01-18 DIAGNOSIS — C9001 Multiple myeloma in remission: Secondary | ICD-10-CM | POA: Diagnosis not present

## 2014-01-18 LAB — COMPREHENSIVE METABOLIC PANEL (CC13)
ALBUMIN: 3.6 g/dL (ref 3.5–5.0)
ALK PHOS: 90 U/L (ref 40–150)
ALT: 11 U/L (ref 0–55)
AST: 18 U/L (ref 5–34)
Anion Gap: 10 mEq/L (ref 3–11)
BILIRUBIN TOTAL: 1.07 mg/dL (ref 0.20–1.20)
BUN: 9.2 mg/dL (ref 7.0–26.0)
CO2: 23 mEq/L (ref 22–29)
CREATININE: 0.7 mg/dL (ref 0.6–1.1)
Calcium: 8.8 mg/dL (ref 8.4–10.4)
Chloride: 109 mEq/L (ref 98–109)
Glucose: 98 mg/dl (ref 70–140)
POTASSIUM: 3.4 meq/L — AB (ref 3.5–5.1)
Sodium: 142 mEq/L (ref 136–145)
Total Protein: 7.3 g/dL (ref 6.4–8.3)

## 2014-01-18 LAB — CBC WITH DIFFERENTIAL/PLATELET
BASO%: 0.2 % (ref 0.0–2.0)
Basophils Absolute: 0 10*3/uL (ref 0.0–0.1)
EOS%: 1.2 % (ref 0.0–7.0)
Eosinophils Absolute: 0.1 10*3/uL (ref 0.0–0.5)
HEMATOCRIT: 38.7 % (ref 34.8–46.6)
HGB: 12.5 g/dL (ref 11.6–15.9)
LYMPH%: 35.9 % (ref 14.0–49.7)
MCH: 29.9 pg (ref 25.1–34.0)
MCHC: 32.3 g/dL (ref 31.5–36.0)
MCV: 92.7 fL (ref 79.5–101.0)
MONO#: 0.8 10*3/uL (ref 0.1–0.9)
MONO%: 8.7 % (ref 0.0–14.0)
NEUT#: 4.9 10*3/uL (ref 1.5–6.5)
NEUT%: 54 % (ref 38.4–76.8)
Platelets: 154 10*3/uL (ref 145–400)
RBC: 4.17 10*6/uL (ref 3.70–5.45)
RDW: 13.5 % (ref 11.2–14.5)
WBC: 9.1 10*3/uL (ref 3.9–10.3)
lymph#: 3.2 10*3/uL (ref 0.9–3.3)

## 2014-01-18 LAB — LACTATE DEHYDROGENASE (CC13): LDH: 182 U/L (ref 125–245)

## 2014-01-20 LAB — IGG, IGA, IGM
IGG (IMMUNOGLOBIN G), SERUM: 1450 mg/dL (ref 690–1700)
IgA: 228 mg/dL (ref 69–380)
IgM, Serum: 170 mg/dL (ref 52–322)

## 2014-01-20 LAB — KAPPA/LAMBDA LIGHT CHAINS
KAPPA FREE LGHT CHN: 1.42 mg/dL (ref 0.33–1.94)
KAPPA LAMBDA RATIO: 0.65 (ref 0.26–1.65)
LAMBDA FREE LGHT CHN: 2.2 mg/dL (ref 0.57–2.63)

## 2014-01-20 LAB — BETA 2 MICROGLOBULIN, SERUM: BETA 2 MICROGLOBULIN: 1.6 mg/L (ref <=2.51)

## 2014-01-25 ENCOUNTER — Ambulatory Visit: Payer: Medicare Other | Admitting: Internal Medicine

## 2014-01-25 ENCOUNTER — Ambulatory Visit: Payer: Medicare Other

## 2014-01-25 ENCOUNTER — Other Ambulatory Visit: Payer: Medicare Other

## 2014-01-25 ENCOUNTER — Telehealth: Payer: Self-pay | Admitting: *Deleted

## 2014-01-25 ENCOUNTER — Other Ambulatory Visit: Payer: Self-pay

## 2014-01-25 NOTE — Telephone Encounter (Signed)
Pt called stating she is not feeling well today and wont be able to come in for her appts.  She had a couple teeth removed and has an infection.  Her dentist is going to give her an antibiotic.  Pt is requesting to r/s appts to next week.  Onc tx schedule filled out.  SLJ

## 2014-01-26 ENCOUNTER — Telehealth: Payer: Self-pay | Admitting: Internal Medicine

## 2014-01-26 NOTE — Telephone Encounter (Signed)
, °

## 2014-01-27 ENCOUNTER — Telehealth: Payer: Self-pay | Admitting: *Deleted

## 2014-01-27 NOTE — Telephone Encounter (Signed)
Per staff message and POF I have scheduled appts.  JMW  

## 2014-01-28 DIAGNOSIS — I10 Essential (primary) hypertension: Secondary | ICD-10-CM | POA: Diagnosis not present

## 2014-01-28 DIAGNOSIS — M129 Arthropathy, unspecified: Secondary | ICD-10-CM | POA: Diagnosis not present

## 2014-01-28 DIAGNOSIS — C9002 Multiple myeloma in relapse: Secondary | ICD-10-CM | POA: Diagnosis not present

## 2014-01-28 DIAGNOSIS — M818 Other osteoporosis without current pathological fracture: Secondary | ICD-10-CM | POA: Diagnosis not present

## 2014-02-09 ENCOUNTER — Other Ambulatory Visit: Payer: Self-pay | Admitting: *Deleted

## 2014-02-09 DIAGNOSIS — C9001 Multiple myeloma in remission: Secondary | ICD-10-CM

## 2014-02-10 ENCOUNTER — Ambulatory Visit (HOSPITAL_BASED_OUTPATIENT_CLINIC_OR_DEPARTMENT_OTHER): Payer: Medicare Other | Admitting: Internal Medicine

## 2014-02-10 ENCOUNTER — Telehealth: Payer: Self-pay | Admitting: Internal Medicine

## 2014-02-10 ENCOUNTER — Other Ambulatory Visit (HOSPITAL_BASED_OUTPATIENT_CLINIC_OR_DEPARTMENT_OTHER): Payer: Medicare Other

## 2014-02-10 ENCOUNTER — Encounter: Payer: Self-pay | Admitting: Internal Medicine

## 2014-02-10 ENCOUNTER — Ambulatory Visit: Payer: Medicare Other

## 2014-02-10 VITALS — BP 151/93 | HR 78 | Temp 98.0°F | Resp 20 | Ht 59.0 in | Wt 149.9 lb

## 2014-02-10 DIAGNOSIS — C9001 Multiple myeloma in remission: Secondary | ICD-10-CM

## 2014-02-10 DIAGNOSIS — C9000 Multiple myeloma not having achieved remission: Secondary | ICD-10-CM | POA: Diagnosis not present

## 2014-02-10 LAB — CBC WITH DIFFERENTIAL/PLATELET
BASO%: 0.2 % (ref 0.0–2.0)
Basophils Absolute: 0 10*3/uL (ref 0.0–0.1)
EOS%: 1.7 % (ref 0.0–7.0)
Eosinophils Absolute: 0.2 10*3/uL (ref 0.0–0.5)
HEMATOCRIT: 38.3 % (ref 34.8–46.6)
HGB: 12.5 g/dL (ref 11.6–15.9)
LYMPH%: 36.5 % (ref 14.0–49.7)
MCH: 30.2 pg (ref 25.1–34.0)
MCHC: 32.6 g/dL (ref 31.5–36.0)
MCV: 92.7 fL (ref 79.5–101.0)
MONO#: 0.6 10*3/uL (ref 0.1–0.9)
MONO%: 6.8 % (ref 0.0–14.0)
NEUT#: 5.1 10*3/uL (ref 1.5–6.5)
NEUT%: 54.8 % (ref 38.4–76.8)
Platelets: 163 10*3/uL (ref 145–400)
RBC: 4.14 10*6/uL (ref 3.70–5.45)
RDW: 13.9 % (ref 11.2–14.5)
WBC: 9.3 10*3/uL (ref 3.9–10.3)
lymph#: 3.4 10*3/uL — ABNORMAL HIGH (ref 0.9–3.3)

## 2014-02-10 LAB — COMPREHENSIVE METABOLIC PANEL (CC13)
ALT: 13 U/L (ref 0–55)
AST: 22 U/L (ref 5–34)
Albumin: 3.9 g/dL (ref 3.5–5.0)
Alkaline Phosphatase: 134 U/L (ref 40–150)
Anion Gap: 10 mEq/L (ref 3–11)
BILIRUBIN TOTAL: 0.73 mg/dL (ref 0.20–1.20)
BUN: 13.1 mg/dL (ref 7.0–26.0)
CO2: 28 mEq/L (ref 22–29)
Calcium: 9.6 mg/dL (ref 8.4–10.4)
Chloride: 104 mEq/L (ref 98–109)
Creatinine: 0.8 mg/dL (ref 0.6–1.1)
Glucose: 80 mg/dl (ref 70–140)
Potassium: 3.6 mEq/L (ref 3.5–5.1)
SODIUM: 141 meq/L (ref 136–145)
TOTAL PROTEIN: 7.7 g/dL (ref 6.4–8.3)

## 2014-02-10 NOTE — Telephone Encounter (Signed)
Gave pt appt for lab and MD for October 2015 emailed Sharyn Lull regarding Zometa

## 2014-02-10 NOTE — Progress Notes (Signed)
[facility] Telephone:([phone]   Fax:(336) [place], [place] [address]  DIAGNOSIS: Multiple myeloma, IgG kappa subtype diagnosed in October 2008   PRIOR THERAPY:  1. Status post radiotherapy to the lower lumbar spine and left pelvic area under the care of Dr. Sondra Come. The patient received a total dose of 3000 cGy. in 15 fractions between 10/14/2007 through 11/07/2007  2.Status post 6 cycles of systemic chemotherapy with Revlimid and Decadron. Last dose given April 2009.  3. status post autologous peripheral blood stem cell transplant on 04/01/2008 under the care of Dr. Valarie Merino at [facility].   CURRENT THERAPY: Zometa 4 mg IV given every 3 months  INTERVAL HISTORY: Ruth Sanchez 68 y.o. female returns to the clinic today for routine three-month followup visit accompanied by her husband. He is feeling fine today with no specific complaints. She denied having any nausea or vomiting. She denied having any significant weight loss or night sweats. She denied having any chest pain, shortness of breath with exertion and no cough or hemoptysis. She had a dental extraction performed recently. She had repeat myeloma panel performed earlier today and she is here for evaluation and discussion of her lab results.  MEDICAL HISTORY: Past Medical History  Diagnosis Date  . Cancer   . Hypertension   . Asthma   . Arthritis     ALLERGIES:  is allergic to aspirin.  MEDICATIONS:  Current Outpatient Prescriptions  Medication Sig Dispense Refill  . albuterol (PROVENTIL HFA;VENTOLIN HFA) 108 (90 BASE) MCG/ACT inhaler Inhale 2 puffs into the lungs every 6 (six) hours as needed. For shortness of breath.      . carisoprodol (SOMA) 350 MG tablet Take 175 mg by mouth 4 (four) times daily as needed. For muscle pain.      . metoprolol tartrate (LOPRESSOR) 25 MG tablet Take 25 mg by mouth 2 (two) times daily.       Marland Kitchen oxyCODONE-acetaminophen (PERCOCET) 10-325 MG per tablet Take 1 tablet by mouth every 6 (six) hours as needed for pain. For pain.  20 tablet  0  . potassium chloride SA (K-DUR,KLOR-CON) 20 MEQ tablet Take 1 tablet (20 mEq total) by mouth daily.  7 tablet  0   No current facility-administered medications for this visit.    SURGICAL HISTORY:  Past Surgical History  Procedure Laterality Date  . Appendectomy    . Tubal ligation    . Cholecystectomy      REVIEW OF SYSTEMS:  A comprehensive review of systems was negative except for: Constitutional: positive for fatigue Musculoskeletal: positive for arthralgias and muscle weakness   PHYSICAL EXAMINATION: General appearance: alert, cooperative, fatigued and no distress Head: Normocephalic, without obvious abnormality, atraumatic Neck: no adenopathy Lymph nodes: Cervical, supraclavicular, and axillary nodes normal. Resp: clear to auscultation bilaterally Cardio: regular rate and rhythm, S1, S2 normal, no murmur, click, rub or gallop GI: soft, non-tender; bowel sounds normal; no masses,  no organomegaly Extremities: extremities normal, atraumatic, no cyanosis or edema   ECOG PERFORMANCE STATUS: 2 - Symptomatic, <50% confined to bed  Blood pressure 151/93, pulse 78, temperature 98 F (36.7 C), temperature source Oral, resp. rate 20, height '4\' 11"'  (1.499 m), weight 149 lb 14.4 oz (67.994 kg).  LABORATORY DATA: Lab Results  Component Value Date   WBC 9.3 02/10/2014   HGB 12.5 02/10/2014   HCT 38.3 02/10/2014   MCV 92.7 02/10/2014  PLT 163 02/10/2014      Chemistry      Component Value Date/Time   NA 141 02/10/2014 1455   NA 140 02/23/2013 0506   K 3.6 02/10/2014 1455   K 3.0* 02/23/2013 0506   CL 103 02/23/2013 0506   CL 104 01/19/2013 1058   CO2 28 02/10/2014 1455   CO2 26 02/23/2013 0506   BUN 13.1 02/10/2014 1455   BUN 9 02/23/2013 0506   CREATININE 0.8 02/10/2014 1455   CREATININE 0.45* 02/23/2013 0506      Component Value Date/Time     CALCIUM 9.6 02/10/2014 1455   CALCIUM 9.0 02/23/2013 0506   ALKPHOS 134 02/10/2014 1455   ALKPHOS 118* 10/20/2012 1704   AST 22 02/10/2014 1455   AST 18 10/20/2012 1704   ALT 13 02/10/2014 1455   ALT 13 10/20/2012 1704   BILITOT 0.73 02/10/2014 1455   BILITOT 0.4 10/20/2012 1704     Other lab results: Beta-2 microglobulin 1.60, free kappa light chain 1.42, free lambda light chain 2.20, kappa/lambda ratio 0.65. IgG 1450, IgA 228 and IgM 170.  RADIOGRAPHIC STUDIES: No results found.  ASSESSMENT AND PLAN: This is a very pleasant 68 years old African American female with history of multiple myeloma status post systemic chemotherapy with Revlimid and Decadron followed by this peripheral blood stem cell transplant and has been observation since June of 2009 with no significant evidence for disease progression. Her myeloma panel was unremarkable for any disease progression. I discussed the lab result with the patient today and recommended for her to continue on observation with repeat myeloma panel in 3 months. The patient would like to delay her next visit until 6 months because she has some copayment that she will need to take care of first. I advised her to speak to the financial advisor at the [place] [facility]. I would hold her treatment with Zometa for now because of the recent dental extraction. I recommended for her to continue on observation for now with repeat myeloma panel in 6 months.  She was advised to call immediately if she has any concerning symptoms in the interval.  All questions were answered. The patient knows to call the clinic with any problems, questions or concerns. We can certainly see the patient much sooner if necessary.  Disclaimer: This note was dictated with voice recognition software. Similar sounding words can inadvertently be transcribed and may not be corrected upon review.

## 2014-02-11 ENCOUNTER — Telehealth: Payer: Self-pay | Admitting: *Deleted

## 2014-02-11 NOTE — Telephone Encounter (Signed)
Per staff message and POF I have scheduled appts.  JMW  

## 2014-02-16 ENCOUNTER — Telehealth: Payer: Self-pay | Admitting: Internal Medicine

## 2014-02-16 NOTE — Telephone Encounter (Signed)
Called pt , left message regarding october appt lab,md and Zometa, mailed appt

## 2014-02-24 DIAGNOSIS — M818 Other osteoporosis without current pathological fracture: Secondary | ICD-10-CM | POA: Diagnosis not present

## 2014-02-24 DIAGNOSIS — I10 Essential (primary) hypertension: Secondary | ICD-10-CM | POA: Diagnosis not present

## 2014-02-24 DIAGNOSIS — M129 Arthropathy, unspecified: Secondary | ICD-10-CM | POA: Diagnosis not present

## 2014-02-24 DIAGNOSIS — C9002 Multiple myeloma in relapse: Secondary | ICD-10-CM | POA: Diagnosis not present

## 2014-03-25 DIAGNOSIS — I10 Essential (primary) hypertension: Secondary | ICD-10-CM | POA: Diagnosis not present

## 2014-03-25 DIAGNOSIS — F411 Generalized anxiety disorder: Secondary | ICD-10-CM | POA: Diagnosis not present

## 2014-03-25 DIAGNOSIS — C9002 Multiple myeloma in relapse: Secondary | ICD-10-CM | POA: Diagnosis not present

## 2014-03-25 DIAGNOSIS — M129 Arthropathy, unspecified: Secondary | ICD-10-CM | POA: Diagnosis not present

## 2014-04-20 DIAGNOSIS — I10 Essential (primary) hypertension: Secondary | ICD-10-CM | POA: Diagnosis not present

## 2014-04-20 DIAGNOSIS — C9002 Multiple myeloma in relapse: Secondary | ICD-10-CM | POA: Diagnosis not present

## 2014-04-20 DIAGNOSIS — M549 Dorsalgia, unspecified: Secondary | ICD-10-CM | POA: Diagnosis not present

## 2014-04-20 DIAGNOSIS — M129 Arthropathy, unspecified: Secondary | ICD-10-CM | POA: Diagnosis not present

## 2014-04-20 DIAGNOSIS — F411 Generalized anxiety disorder: Secondary | ICD-10-CM | POA: Diagnosis not present

## 2014-05-20 DIAGNOSIS — C9002 Multiple myeloma in relapse: Secondary | ICD-10-CM | POA: Diagnosis not present

## 2014-05-20 DIAGNOSIS — M129 Arthropathy, unspecified: Secondary | ICD-10-CM | POA: Diagnosis not present

## 2014-05-20 DIAGNOSIS — I10 Essential (primary) hypertension: Secondary | ICD-10-CM | POA: Diagnosis not present

## 2014-05-20 DIAGNOSIS — M549 Dorsalgia, unspecified: Secondary | ICD-10-CM | POA: Diagnosis not present

## 2014-06-21 DIAGNOSIS — C9002 Multiple myeloma in relapse: Secondary | ICD-10-CM | POA: Diagnosis not present

## 2014-06-21 DIAGNOSIS — M549 Dorsalgia, unspecified: Secondary | ICD-10-CM | POA: Diagnosis not present

## 2014-06-21 DIAGNOSIS — I10 Essential (primary) hypertension: Secondary | ICD-10-CM | POA: Diagnosis not present

## 2014-06-21 DIAGNOSIS — M129 Arthropathy, unspecified: Secondary | ICD-10-CM | POA: Diagnosis not present

## 2014-07-07 DIAGNOSIS — R197 Diarrhea, unspecified: Secondary | ICD-10-CM | POA: Diagnosis not present

## 2014-07-07 DIAGNOSIS — C9002 Multiple myeloma in relapse: Secondary | ICD-10-CM | POA: Diagnosis not present

## 2014-07-07 DIAGNOSIS — R1084 Generalized abdominal pain: Secondary | ICD-10-CM | POA: Diagnosis not present

## 2014-07-20 DIAGNOSIS — I10 Essential (primary) hypertension: Secondary | ICD-10-CM | POA: Diagnosis not present

## 2014-07-20 DIAGNOSIS — C9002 Multiple myeloma in relapse: Secondary | ICD-10-CM | POA: Diagnosis not present

## 2014-07-20 DIAGNOSIS — M549 Dorsalgia, unspecified: Secondary | ICD-10-CM | POA: Diagnosis not present

## 2014-07-20 DIAGNOSIS — M129 Arthropathy, unspecified: Secondary | ICD-10-CM | POA: Diagnosis not present

## 2014-07-26 ENCOUNTER — Telehealth: Payer: Self-pay | Admitting: Internal Medicine

## 2014-07-26 NOTE — Telephone Encounter (Signed)
pt called to confirm appt....pt ok and aware °

## 2014-07-27 ENCOUNTER — Telehealth: Payer: Self-pay | Admitting: Internal Medicine

## 2014-07-27 NOTE — Telephone Encounter (Signed)
returned pt call and confirmed appt....ok and aware °

## 2014-08-06 ENCOUNTER — Other Ambulatory Visit (HOSPITAL_BASED_OUTPATIENT_CLINIC_OR_DEPARTMENT_OTHER): Payer: Medicare Other

## 2014-08-06 DIAGNOSIS — C9000 Multiple myeloma not having achieved remission: Secondary | ICD-10-CM | POA: Diagnosis not present

## 2014-08-06 DIAGNOSIS — C9001 Multiple myeloma in remission: Secondary | ICD-10-CM | POA: Diagnosis not present

## 2014-08-06 LAB — CBC WITH DIFFERENTIAL/PLATELET
BASO%: 0.1 % (ref 0.0–2.0)
BASOS ABS: 0 10*3/uL (ref 0.0–0.1)
EOS%: 1.8 % (ref 0.0–7.0)
Eosinophils Absolute: 0.2 10*3/uL (ref 0.0–0.5)
HEMATOCRIT: 36.5 % (ref 34.8–46.6)
HEMOGLOBIN: 12 g/dL (ref 11.6–15.9)
LYMPH%: 35.9 % (ref 14.0–49.7)
MCH: 30.3 pg (ref 25.1–34.0)
MCHC: 32.9 g/dL (ref 31.5–36.0)
MCV: 92.2 fL (ref 79.5–101.0)
MONO#: 0.5 10*3/uL (ref 0.1–0.9)
MONO%: 6.1 % (ref 0.0–14.0)
NEUT#: 4.6 10*3/uL (ref 1.5–6.5)
NEUT%: 56.1 % (ref 38.4–76.8)
PLATELETS: 134 10*3/uL — AB (ref 145–400)
RBC: 3.96 10*6/uL (ref 3.70–5.45)
RDW: 14 % (ref 11.2–14.5)
WBC: 8.1 10*3/uL (ref 3.9–10.3)
lymph#: 2.9 10*3/uL (ref 0.9–3.3)

## 2014-08-06 LAB — COMPREHENSIVE METABOLIC PANEL (CC13)
ALT: 14 U/L (ref 0–55)
AST: 18 U/L (ref 5–34)
Albumin: 3.6 g/dL (ref 3.5–5.0)
Alkaline Phosphatase: 118 U/L (ref 40–150)
Anion Gap: 9 mEq/L (ref 3–11)
BILIRUBIN TOTAL: 0.94 mg/dL (ref 0.20–1.20)
BUN: 18.7 mg/dL (ref 7.0–26.0)
CO2: 26 mEq/L (ref 22–29)
CREATININE: 0.8 mg/dL (ref 0.6–1.1)
Calcium: 9.2 mg/dL (ref 8.4–10.4)
Chloride: 108 mEq/L (ref 98–109)
Glucose: 89 mg/dl (ref 70–140)
Potassium: 3.5 mEq/L (ref 3.5–5.1)
Sodium: 143 mEq/L (ref 136–145)
Total Protein: 7.2 g/dL (ref 6.4–8.3)

## 2014-08-06 LAB — LACTATE DEHYDROGENASE (CC13): LDH: 198 U/L (ref 125–245)

## 2014-08-09 LAB — KAPPA/LAMBDA LIGHT CHAINS
KAPPA FREE LGHT CHN: 1.87 mg/dL (ref 0.33–1.94)
Kappa:Lambda Ratio: 0.82 (ref 0.26–1.65)
LAMBDA FREE LGHT CHN: 2.29 mg/dL (ref 0.57–2.63)

## 2014-08-09 LAB — IGG, IGA, IGM
IGG (IMMUNOGLOBIN G), SERUM: 1560 mg/dL (ref 690–1700)
IgA: 235 mg/dL (ref 69–380)
IgM, Serum: 157 mg/dL (ref 52–322)

## 2014-08-09 LAB — BETA 2 MICROGLOBULIN, SERUM: Beta-2 Microglobulin: 1.94 mg/L (ref <=2.51)

## 2014-08-11 ENCOUNTER — Ambulatory Visit (HOSPITAL_BASED_OUTPATIENT_CLINIC_OR_DEPARTMENT_OTHER): Payer: Medicare Other

## 2014-08-11 ENCOUNTER — Telehealth: Payer: Self-pay | Admitting: Internal Medicine

## 2014-08-11 ENCOUNTER — Telehealth: Payer: Self-pay | Admitting: Medical Oncology

## 2014-08-11 ENCOUNTER — Encounter: Payer: Self-pay | Admitting: Internal Medicine

## 2014-08-11 ENCOUNTER — Telehealth: Payer: Self-pay | Admitting: *Deleted

## 2014-08-11 ENCOUNTER — Ambulatory Visit (HOSPITAL_BASED_OUTPATIENT_CLINIC_OR_DEPARTMENT_OTHER): Payer: Medicare Other | Admitting: Internal Medicine

## 2014-08-11 VITALS — BP 176/99 | HR 81 | Temp 98.8°F | Resp 18 | Ht 59.0 in | Wt 144.8 lb

## 2014-08-11 DIAGNOSIS — C9000 Multiple myeloma not having achieved remission: Secondary | ICD-10-CM

## 2014-08-11 DIAGNOSIS — C9001 Multiple myeloma in remission: Secondary | ICD-10-CM

## 2014-08-11 MED ORDER — ZOLEDRONIC ACID 4 MG/100ML IV SOLN
4.0000 mg | Freq: Once | INTRAVENOUS | Status: AC
  Administered 2014-08-11: 4 mg via INTRAVENOUS
  Filled 2014-08-11: qty 100

## 2014-08-11 MED ORDER — SODIUM CHLORIDE 0.9 % IV SOLN
Freq: Once | INTRAVENOUS | Status: AC
  Administered 2014-08-11: 15:00:00 via INTRAVENOUS

## 2014-08-11 NOTE — Telephone Encounter (Signed)
Per staff message and POF I have scheduled appts. Advised scheduler of appts. JMW  

## 2014-08-11 NOTE — Telephone Encounter (Signed)
Confirmed appts for today.

## 2014-08-11 NOTE — Patient Instructions (Signed)

## 2014-08-11 NOTE — Telephone Encounter (Signed)
Pt confirmed labs/ov per 10/21 POF, sent msg to add Zometa, gave pt AVS..... KJ

## 2014-08-11 NOTE — Progress Notes (Signed)
[place] Telephone:(336) [phone]   Fax:(336) [place], [place] [address]  DIAGNOSIS: Multiple myeloma, IgG kappa subtype diagnosed in October 2008   PRIOR THERAPY:  1. Status post radiotherapy to the lower lumbar spine and left pelvic area under the care of Dr. Sondra Come. The patient received a total dose of 3000 cGy. in 15 fractions between 10/14/2007 through 11/07/2007  2.Status post 6 cycles of systemic chemotherapy with Revlimid and Decadron. Last dose given April 2009.  3. status post autologous peripheral blood stem cell transplant on 04/01/2008 under the care of Dr. Valarie Merino at [facility].   CURRENT THERAPY: Zometa 4 mg IV given every 3 months  INTERVAL HISTORY: Ruth Sanchez 68 y.o. female returns to the clinic today for routine three-month followup visit accompanied by her husband. He is feeling fine today with no specific complaints except for arthritis in the left knee. She denied having any nausea or vomiting. She denied having any significant weight loss or night sweats. She denied having any chest pain, shortness of breath with exertion and no cough or hemoptysis. She had repeat myeloma panel performed earlier today and she is here for evaluation and discussion of her lab results.  MEDICAL HISTORY: Past Medical History  Diagnosis Date  . Cancer   . Hypertension   . Asthma   . Arthritis     ALLERGIES:  is allergic to aspirin.  MEDICATIONS:  Current Outpatient Prescriptions  Medication Sig Dispense Refill  . albuterol (PROVENTIL HFA;VENTOLIN HFA) 108 (90 BASE) MCG/ACT inhaler Inhale 2 puffs into the lungs every 6 (six) hours as needed. For shortness of breath.      . carisoprodol (SOMA) 350 MG tablet Take 175 mg by mouth 4 (four) times daily as needed. For muscle pain.      . metoprolol tartrate (LOPRESSOR) 25 MG tablet Take 25 mg by mouth 2 (two) times daily.      Marland Kitchen  oxyCODONE-acetaminophen (PERCOCET) 10-325 MG per tablet Take 1 tablet by mouth every 6 (six) hours as needed for pain. For pain.  20 tablet  0  . potassium chloride SA (K-DUR,KLOR-CON) 20 MEQ tablet Take 1 tablet (20 mEq total) by mouth daily.  7 tablet  0   No current facility-administered medications for this visit.    SURGICAL HISTORY:  Past Surgical History  Procedure Laterality Date  . Appendectomy    . Tubal ligation    . Cholecystectomy      REVIEW OF SYSTEMS:  A comprehensive review of systems was negative except for: Constitutional: positive for fatigue Musculoskeletal: positive for arthralgias and muscle weakness   PHYSICAL EXAMINATION: General appearance: alert, cooperative, fatigued and no distress Head: Normocephalic, without obvious abnormality, atraumatic Neck: no adenopathy Lymph nodes: Cervical, supraclavicular, and axillary nodes normal. Resp: clear to auscultation bilaterally Cardio: regular rate and rhythm, S1, S2 normal, no murmur, click, rub or gallop GI: soft, non-tender; bowel sounds normal; no masses,  no organomegaly Extremities: extremities normal, atraumatic, no cyanosis or edema   ECOG PERFORMANCE STATUS: 1 - Symptomatic but completely ambulatory  Blood pressure 176/99, pulse 81, temperature 98.8 F (37.1 C), temperature source Oral, resp. rate 18, height _0  (1.499 m), weight 144 lb 12.8 oz (65.681 kg), SpO2 99.00%.  LABORATORY DATA: Lab Results  Component Value Date   WBC 8.1 08/06/2014   HGB 12.0 08/06/2014   HCT 36.5 08/06/2014   MCV 92.2 08/06/2014  PLT 134* 08/06/2014      Chemistry      Component Value Date/Time   NA 143 08/06/2014 1509   NA 140 02/23/2013 0506   K 3.5 08/06/2014 1509   K 3.0* 02/23/2013 0506   CL 103 02/23/2013 0506   CL 104 01/19/2013 1058   CO2 26 08/06/2014 1509   CO2 26 02/23/2013 0506   BUN 18.7 08/06/2014 1509   BUN 9 02/23/2013 0506   CREATININE 0.8 08/06/2014 1509   CREATININE 0.45* 02/23/2013 0506        Component Value Date/Time   CALCIUM 9.2 08/06/2014 1509   CALCIUM 9.0 02/23/2013 0506   ALKPHOS 118 08/06/2014 1509   ALKPHOS 118* 10/20/2012 1704   AST 18 08/06/2014 1509   AST 18 10/20/2012 1704   ALT 14 08/06/2014 1509   ALT 13 10/20/2012 1704   BILITOT 0.94 08/06/2014 1509   BILITOT 0.4 10/20/2012 1704     Other lab results: Beta-2 microglobulin 1.94, free kappa light chain 1.87, free lambda light chain 2.29, kappa/lambda ratio 0.82. IgG 1560, IgA 235 and IgM 157.  RADIOGRAPHIC STUDIES: No results found.  ASSESSMENT AND PLAN: This is a very pleasant 68 years old African American female with history of multiple myeloma status post systemic chemotherapy with Revlimid and Decadron followed by this peripheral blood stem cell transplant and has been observation since June of 2009 with no significant evidence for disease progression. Her myeloma panel was unremarkable for any disease progression. I discussed the lab result with the patient today and recommended for her to continue on observation with repeat myeloma panel in 3 months.  She will resume her treatment with Zometa. I recommended for her to continue on observation for now with repeat myeloma panel in 3 months.  She was advised to call immediately if she has any concerning symptoms in the interval.  All questions were answered. The patient knows to call the clinic with any problems, questions or concerns. We can certainly see the patient much sooner if necessary.  Disclaimer: This note was dictated with voice recognition software. Similar sounding words can inadvertently be transcribed and may not be corrected upon review.

## 2014-08-18 DIAGNOSIS — I10 Essential (primary) hypertension: Secondary | ICD-10-CM | POA: Diagnosis not present

## 2014-08-18 DIAGNOSIS — F419 Anxiety disorder, unspecified: Secondary | ICD-10-CM | POA: Diagnosis not present

## 2014-08-18 DIAGNOSIS — C9000 Multiple myeloma not having achieved remission: Secondary | ICD-10-CM | POA: Diagnosis not present

## 2014-08-18 DIAGNOSIS — M160 Bilateral primary osteoarthritis of hip: Secondary | ICD-10-CM | POA: Diagnosis not present

## 2014-08-18 DIAGNOSIS — M545 Low back pain: Secondary | ICD-10-CM | POA: Diagnosis not present

## 2014-09-14 DIAGNOSIS — F419 Anxiety disorder, unspecified: Secondary | ICD-10-CM | POA: Diagnosis not present

## 2014-09-14 DIAGNOSIS — I10 Essential (primary) hypertension: Secondary | ICD-10-CM | POA: Diagnosis not present

## 2014-09-14 DIAGNOSIS — M545 Low back pain: Secondary | ICD-10-CM | POA: Diagnosis not present

## 2014-09-14 DIAGNOSIS — C9000 Multiple myeloma not having achieved remission: Secondary | ICD-10-CM | POA: Diagnosis not present

## 2014-09-14 DIAGNOSIS — M160 Bilateral primary osteoarthritis of hip: Secondary | ICD-10-CM | POA: Diagnosis not present

## 2014-09-18 ENCOUNTER — Emergency Department (HOSPITAL_COMMUNITY): Payer: Medicare Other

## 2014-09-18 ENCOUNTER — Encounter (HOSPITAL_COMMUNITY): Payer: Self-pay | Admitting: *Deleted

## 2014-09-18 ENCOUNTER — Emergency Department (HOSPITAL_COMMUNITY)
Admission: EM | Admit: 2014-09-18 | Discharge: 2014-09-18 | Disposition: A | Discharge status: Home / Self Care | Payer: Medicare Other | Attending: Emergency Medicine | Admitting: Emergency Medicine

## 2014-09-18 DIAGNOSIS — M1990 Unspecified osteoarthritis, unspecified site: Secondary | ICD-10-CM | POA: Diagnosis not present

## 2014-09-18 DIAGNOSIS — R0789 Other chest pain: Secondary | ICD-10-CM | POA: Diagnosis not present

## 2014-09-18 DIAGNOSIS — J45909 Unspecified asthma, uncomplicated: Secondary | ICD-10-CM | POA: Diagnosis not present

## 2014-09-18 DIAGNOSIS — R079 Chest pain, unspecified: Secondary | ICD-10-CM

## 2014-09-18 DIAGNOSIS — Z8579 Personal history of other malignant neoplasms of lymphoid, hematopoietic and related tissues: Secondary | ICD-10-CM | POA: Insufficient documentation

## 2014-09-18 DIAGNOSIS — Z9481 Bone marrow transplant status: Secondary | ICD-10-CM | POA: Diagnosis not present

## 2014-09-18 DIAGNOSIS — Z79899 Other long term (current) drug therapy: Secondary | ICD-10-CM | POA: Insufficient documentation

## 2014-09-18 DIAGNOSIS — I10 Essential (primary) hypertension: Secondary | ICD-10-CM | POA: Diagnosis not present

## 2014-09-18 DIAGNOSIS — N644 Mastodynia: Secondary | ICD-10-CM | POA: Diagnosis present

## 2014-09-18 LAB — CBC WITH DIFFERENTIAL/PLATELET
BASOS ABS: 0 10*3/uL (ref 0.0–0.1)
Basophils Relative: 0 % (ref 0–1)
EOS PCT: 1 % (ref 0–5)
Eosinophils Absolute: 0.1 10*3/uL (ref 0.0–0.7)
HEMATOCRIT: 35.9 % — AB (ref 36.0–46.0)
HEMOGLOBIN: 11.5 g/dL — AB (ref 12.0–15.0)
LYMPHS PCT: 22 % (ref 12–46)
Lymphs Abs: 2 10*3/uL (ref 0.7–4.0)
MCH: 30.5 pg (ref 26.0–34.0)
MCHC: 32 g/dL (ref 30.0–36.0)
MCV: 95.2 fL (ref 78.0–100.0)
MONO ABS: 1.1 10*3/uL — AB (ref 0.1–1.0)
MONOS PCT: 12 % (ref 3–12)
NEUTROS ABS: 5.9 10*3/uL (ref 1.7–7.7)
Neutrophils Relative %: 65 % (ref 43–77)
Platelets: 131 10*3/uL — ABNORMAL LOW (ref 150–400)
RBC: 3.77 MIL/uL — ABNORMAL LOW (ref 3.87–5.11)
RDW: 13.5 % (ref 11.5–15.5)
WBC: 9 10*3/uL (ref 4.0–10.5)

## 2014-09-18 LAB — COMPREHENSIVE METABOLIC PANEL
ALBUMIN: 3.6 g/dL (ref 3.5–5.2)
ALK PHOS: 90 U/L (ref 39–117)
ALT: 38 U/L — ABNORMAL HIGH (ref 0–35)
AST: 35 U/L (ref 0–37)
Anion gap: 13 (ref 5–15)
BUN: 12 mg/dL (ref 6–23)
CHLORIDE: 101 meq/L (ref 96–112)
CO2: 28 mEq/L (ref 19–32)
Calcium: 9 mg/dL (ref 8.4–10.5)
Creatinine, Ser: 0.64 mg/dL (ref 0.50–1.10)
GFR calc Af Amer: >90 mL/min (ref >90)
GFR calc non Af Amer: 88 mL/min — ABNORMAL LOW (ref >90)
Glucose, Bld: 62 mg/dL — ABNORMAL LOW (ref 70–99)
POTASSIUM: 3.2 meq/L — AB (ref 3.7–5.3)
SODIUM: 142 meq/L (ref 137–147)
TOTAL PROTEIN: 7.8 g/dL (ref 6.0–8.3)
Total Bilirubin: 1 mg/dL (ref 0.3–1.2)

## 2014-09-18 MED ORDER — OXYCODONE-ACETAMINOPHEN 5-325 MG PO TABS: 1.0000 | ORAL_TABLET | ORAL | Status: DC | PRN

## 2014-09-18 MED ORDER — IOHEXOL 350 MG/ML SOLN
100.0000 mL | Freq: Once | INTRAVENOUS | Status: AC | PRN
  Administered 2014-09-18: 100 mL via INTRAVENOUS

## 2014-09-18 MED ORDER — OXYCODONE-ACETAMINOPHEN 5-325 MG PO TABS
2.0000 | ORAL_TABLET | Freq: Once | ORAL | Status: AC
  Administered 2014-09-18: 2 via ORAL
  Filled 2014-09-18: qty 2

## 2014-09-18 NOTE — ED Notes (Signed)
Pt reports pain in her right breast and ribs, sts hx of bone cancer.

## 2014-09-18 NOTE — ED Notes (Signed)
MD at bedside. Dr. Jeanell Sparrow at bedside.

## 2014-09-18 NOTE — ED Provider Notes (Signed)
CSN: [id number]     Arrival date & time 09/18/14  1435 History   First MD Initiated Contact with Patient 09/18/14 1514     Chief Complaint  Patient presents with  . Breast Pain  . cancer patient      (Consider location/radiation/quality/duration/timing/severity/associated sxs/prior Treatment) HPI  68 year old female with a history of multiple myeloma status post BONE marrow transplant without complaining of pain in the right anterior chest for 3-4 weeks. She states the pain is in the right breast and in the chest wall. Pain is worse with movement, coughing, and palpation. She states that she has been seen for her problem by her primary care doctor for this since it began but is unable to tell me if any tests were run. She takes oxycodone at home for pain. Her husband states that it was filled on Wednesday she states she is out of them. He then told me that she may have lost them. Husband states that  she hides her pills and is unable to find them.    Past Medical History  Diagnosis Date  . Cancer   . Hypertension   . Asthma   . Arthritis    Past Surgical History  Procedure Laterality Date  . Appendectomy    . Tubal ligation    . Cholecystectomy     No family history on file. History  Substance Use Topics  . Smoking status: Never Smoker   . Smokeless tobacco: Never Used  . Alcohol Use: No   OB History    No data available     Review of Systems  All other systems reviewed and are negative.     Allergies  Aspirin  Home Medications   Prior to Admission medications   Medication Sig Start Date End Date Taking? Authorizing Provider  carisoprodol (SOMA) 350 MG tablet Take 175 mg by mouth 4 (four) times daily as needed for muscle spasms (muscle spasm). For muscle pain.   Yes Historical Provider, MD  metoprolol tartrate (LOPRESSOR) 25 MG tablet Take 25 mg by mouth 2 (two) times daily.   Yes Historical Provider, MD  oxyCODONE (ROXICODONE) 15 MG immediate release tablet Take  15 mg by mouth every 4 (four) hours as needed for pain (pain).   Yes Historical Provider, MD  albuterol (PROVENTIL HFA;VENTOLIN HFA) 108 (90 BASE) MCG/ACT inhaler Inhale 2 puffs into the lungs every 6 (six) hours as needed. For shortness of breath.    Historical Provider, MD  oxyCODONE-acetaminophen (PERCOCET) 10-325 MG per tablet Take 1 tablet by mouth every 6 (six) hours as needed for pain. For pain. 01/15/13   Curt Bears, MD  potassium chloride SA (K-DUR,KLOR-CON) 20 MEQ tablet Take 1 tablet (20 mEq total) by mouth daily. Patient not taking: Reported on 09/18/2014 10/27/13   Curt Bears, MD   BP 151/80 mmHg  Pulse 93  Temp(Src) 99.1 F (37.3 C) (Oral)  Resp 12  SpO2 96% Physical Exam  Constitutional: She is oriented to person, place, and time. She appears well-developed and well-nourished.  HENT:  Head: Normocephalic and atraumatic.  Right Ear: External ear normal.  Left Ear: External ear normal.  Nose: Nose normal.  Mouth/Throat: Oropharynx is clear and moist.  Eyes: Conjunctivae and EOM are normal. Pupils are equal, round, and reactive to light.  Neck: Neck supple.  Cardiovascular: Normal rate, regular rhythm, normal heart sounds and intact distal pulses.   Pulmonary/Chest: She exhibits tenderness.  Right breast tender to palpation no masses or abnormalities palpated no warmth  Abdominal: Bowel sounds are normal. She exhibits no distension. There is no tenderness.  Musculoskeletal: Normal range of motion.  Neurological: She is alert and oriented to person, place, and time. She has normal reflexes.  Skin: Skin is warm and dry.  Psychiatric: She has a normal mood and affect. Her behavior is normal. Judgment and thought content normal.  Nursing note and vitals reviewed.   ED Course  Procedures (including critical care time) Labs Review Labs Reviewed  COMPREHENSIVE METABOLIC PANEL - Abnormal; Notable for the following:    Potassium 3.2 (*)    Glucose, Bld 62 (*)    ALT  38 (*)    GFR calc non Af Amer 88 (*)    All other components within normal limits  CBC WITH DIFFERENTIAL - Abnormal; Notable for the following:    RBC 3.77 (*)    Hemoglobin 11.5 (*)    HCT 35.9 (*)    Platelets 131 (*)    Monocytes Absolute 1.1 (*)    All other components within normal limits    Imaging Review Ct Angio Chest Pe W/cm &/or Wo Cm  09/18/2014   CLINICAL DATA:  Right chest pain.  History of multiple myeloma.  EXAM: CT ANGIOGRAPHY CHEST WITH CONTRAST  TECHNIQUE: Multidetector CT imaging of the chest was performed using the standard protocol during bolus administration of intravenous contrast. Multiplanar CT image reconstructions and MIPs were obtained to evaluate the vascular anatomy.  CONTRAST:  133m OMNIPAQUE IOHEXOL 350 MG/ML SOLN  COMPARISON:  CT angiogram dated 02/23/2013  FINDINGS: Markedly enlarged left lobe of the thyroid gland with extensive calcifications. This measures 6.1 x 5.6 cm, unchanged. Trachea is deviated to the right.  There are no pulmonary emboli or infiltrates or effusions. There is extensive linear scarring in both lungs, unchanged. There is chronic cardiomegaly. There is chronic accentuation of the thoracic kyphosis with diffuse inhomogeneity of the bones consistent with the patient's history of myeloma. Flowing osteophytes fuse most of the cervical and thoracic spine. No acute osseous abnormalities.  Review of the MIP images confirms the above findings.  IMPRESSION: No acute abnormalities. Specifically, no pulmonary emboli. Extensive scarring in both lungs. Chronic cardiomegaly.   Electronically Signed   By: JRozetta NunneryM.D.   On: 09/18/2014 17:26     EKG Interpretation   Date/Time:  Saturday September 18 2014 15:50:45 EST Ventricular Rate:  89 PR Interval:    QRS Duration: 88 QT Interval:  369 QTC Calculation: 449 R Axis:   32 Text Interpretation:  Atrial fibrillation Borderline repolarization  abnormality No significant change since last tracing  Confirmed by  JWinfred Leeds MD, SAM [phone] on 09/18/2014 3:54:18 PM      MDM   Final diagnoses:  Chest pain  Right-sided chest wall pain    68year old female with history of multiple myeloma who presents today with right-sided chest pain that has been present for approximately a month. Today's workup reveals no evidence of acute abnormality including bony and amount these of the right-sided ribs, cardiac ischemia, or pulmonary embolism. The patient has been taking oxycodone 15 mg every 4 hours but it is unclear exactly how her pain mesh and medicine has been managed. She reports that she cannot find it. I'm concerned she may be taking too much of this I'm also concerned that she could potentially overdose unintentionally as both she and her husband appear to be somewhat unclear of the quantities and amounts and where they're located in the house. Plan to give the  patient 10 oxycodone to last for the weekend and they revised that she will need to obtain all of her narcotic made pain medicines from one source and they may need a home health visit to help organize and account for medications. Patient and husband voice understanding of the plan, need for follow-up, and return precautions.   Shaune Pollack, MD 09/18/14 [phone]

## 2014-09-18 NOTE — Discharge Instructions (Signed)
Please place medicines in appropriate medication dispenser area.Do not take more than the prescribed amount. You'll need to follow-up with the physician who is prescribing pain medicine on Monday. All of your pain medication should come from one physician. Please discuss the possibility of home health coming in and assessed in your situation with your physician.

## 2014-11-08 ENCOUNTER — Other Ambulatory Visit (HOSPITAL_BASED_OUTPATIENT_CLINIC_OR_DEPARTMENT_OTHER): Payer: Medicare Other

## 2014-11-08 DIAGNOSIS — C9001 Multiple myeloma in remission: Secondary | ICD-10-CM | POA: Diagnosis not present

## 2014-11-08 LAB — COMPREHENSIVE METABOLIC PANEL (CC13)
ALT: 20 U/L (ref 0–55)
AST: 33 U/L (ref 5–34)
Albumin: 3.6 g/dL (ref 3.5–5.0)
Alkaline Phosphatase: 100 U/L (ref 40–150)
Anion Gap: 10 mEq/L (ref 3–11)
BUN: 9.4 mg/dL (ref 7.0–26.0)
CO2: 28 meq/L (ref 22–29)
Calcium: 8.6 mg/dL (ref 8.4–10.4)
Chloride: 106 mEq/L (ref 98–109)
Creatinine: 0.7 mg/dL (ref 0.6–1.1)
EGFR: >90 mL/min/{1.73_m2} (ref >90)
GLUCOSE: 87 mg/dL (ref 70–140)
Potassium: 3.3 mEq/L — ABNORMAL LOW (ref 3.5–5.1)
Sodium: 143 mEq/L (ref 136–145)
TOTAL PROTEIN: 7.2 g/dL (ref 6.4–8.3)
Total Bilirubin: 0.75 mg/dL (ref 0.20–1.20)

## 2014-11-08 LAB — CBC WITH DIFFERENTIAL/PLATELET
BASO%: 0.3 % (ref 0.0–2.0)
BASOS ABS: 0 10*3/uL (ref 0.0–0.1)
EOS%: 1.7 % (ref 0.0–7.0)
Eosinophils Absolute: 0.1 10*3/uL (ref 0.0–0.5)
HEMATOCRIT: 36.1 % (ref 34.8–46.6)
HGB: 11.5 g/dL — ABNORMAL LOW (ref 11.6–15.9)
LYMPH#: 2.2 10*3/uL (ref 0.9–3.3)
LYMPH%: 33.3 % (ref 14.0–49.7)
MCH: 29.2 pg (ref 25.1–34.0)
MCHC: 31.8 g/dL (ref 31.5–36.0)
MCV: 92 fL (ref 79.5–101.0)
MONO#: 0.5 10*3/uL (ref 0.1–0.9)
MONO%: 7.9 % (ref 0.0–14.0)
NEUT#: 3.8 10*3/uL (ref 1.5–6.5)
NEUT%: 56.8 % (ref 38.4–76.8)
Platelets: 135 10*3/uL — ABNORMAL LOW (ref 145–400)
RBC: 3.92 10*6/uL (ref 3.70–5.45)
RDW: 13.1 % (ref 11.2–14.5)
WBC: 6.7 10*3/uL (ref 3.9–10.3)

## 2014-11-08 LAB — LACTATE DEHYDROGENASE (CC13): LDH: 226 U/L (ref 125–245)

## 2014-11-10 LAB — IGG, IGA, IGM
IGA: 279 mg/dL (ref 69–380)
IGG (IMMUNOGLOBIN G), SERUM: 1500 mg/dL (ref 690–1700)
IGM, SERUM: 148 mg/dL (ref 52–322)

## 2014-11-10 LAB — KAPPA/LAMBDA LIGHT CHAINS
Kappa free light chain: 2.13 mg/dL — ABNORMAL HIGH (ref 0.33–1.94)
Kappa:Lambda Ratio: 0.89 (ref 0.26–1.65)
Lambda Free Lght Chn: 2.38 mg/dL (ref 0.57–2.63)

## 2014-11-10 LAB — BETA 2 MICROGLOBULIN, SERUM: Beta-2 Microglobulin: 1.66 mg/L (ref <=2.51)

## 2014-11-11 DIAGNOSIS — M160 Bilateral primary osteoarthritis of hip: Secondary | ICD-10-CM | POA: Diagnosis not present

## 2014-11-11 DIAGNOSIS — F419 Anxiety disorder, unspecified: Secondary | ICD-10-CM | POA: Diagnosis not present

## 2014-11-11 DIAGNOSIS — C9000 Multiple myeloma not having achieved remission: Secondary | ICD-10-CM | POA: Diagnosis not present

## 2014-11-11 DIAGNOSIS — M545 Low back pain: Secondary | ICD-10-CM | POA: Diagnosis not present

## 2014-11-11 DIAGNOSIS — I10 Essential (primary) hypertension: Secondary | ICD-10-CM | POA: Diagnosis not present

## 2014-11-16 ENCOUNTER — Telehealth: Payer: Self-pay | Admitting: *Deleted

## 2014-11-16 ENCOUNTER — Ambulatory Visit (HOSPITAL_BASED_OUTPATIENT_CLINIC_OR_DEPARTMENT_OTHER): Payer: Medicare Other

## 2014-11-16 ENCOUNTER — Encounter: Payer: Self-pay | Admitting: Internal Medicine

## 2014-11-16 ENCOUNTER — Ambulatory Visit (HOSPITAL_BASED_OUTPATIENT_CLINIC_OR_DEPARTMENT_OTHER): Payer: Medicare Other | Admitting: Internal Medicine

## 2014-11-16 ENCOUNTER — Telehealth: Payer: Self-pay | Admitting: Internal Medicine

## 2014-11-16 VITALS — BP 145/109 | HR 103 | Temp 98.6°F | Resp 18 | Ht 59.0 in | Wt 144.2 lb

## 2014-11-16 DIAGNOSIS — C9001 Multiple myeloma in remission: Secondary | ICD-10-CM | POA: Diagnosis not present

## 2014-11-16 DIAGNOSIS — Z9484 Stem cells transplant status: Secondary | ICD-10-CM | POA: Diagnosis not present

## 2014-11-16 MED ORDER — SODIUM CHLORIDE 0.9 % IV SOLN
INTRAVENOUS | Status: DC
  Administered 2014-11-16: 15:00:00 via INTRAVENOUS

## 2014-11-16 MED ORDER — ZOLEDRONIC ACID 4 MG/100ML IV SOLN
4.0000 mg | Freq: Once | INTRAVENOUS | Status: AC
  Administered 2014-11-16: 4 mg via INTRAVENOUS
  Filled 2014-11-16: qty 100

## 2014-11-16 NOTE — Telephone Encounter (Signed)
Per staff message and POF I have scheduled appts. Advised scheduler of appts. JMW  

## 2014-11-16 NOTE — Patient Instructions (Signed)

## 2014-11-16 NOTE — Telephone Encounter (Signed)
Gave avs & calendar for July. Sent message to schedule treatment.

## 2014-11-16 NOTE — Progress Notes (Signed)
[place] Telephone:([phone]   Fax:(336) [place], [place] [address]  DIAGNOSIS: Multiple myeloma, IgG kappa subtype diagnosed in October 2008   PRIOR THERAPY:  1. Status post radiotherapy to the lower lumbar spine and left pelvic area under the care of Dr. Sondra Come. The patient received a total dose of 3000 cGy. in 15 fractions between 10/14/2007 through 11/07/2007  2.Status post 6 cycles of systemic chemotherapy with Revlimid and Decadron. Last dose given April 2009.  3. status post autologous peripheral blood stem cell transplant on 04/01/2008 under the care of Dr. Valarie Merino at [facility].   CURRENT THERAPY: Zometa 4 mg IV given every 3 months  INTERVAL HISTORY: Ruth Sanchez 69 y.o. female returns to the clinic today for routine three-month followup visit accompanied by her husband. He is feeling fine today with no specific complaints except for the persistent arthritis in the left knee. She denied having any nausea or vomiting. She denied having any significant weight loss or night sweats. She denied having any chest pain, shortness of breath with exertion and no cough or hemoptysis. She had repeat myeloma panel performed earlier today and she is here for evaluation and discussion of her lab results.  MEDICAL HISTORY: Past Medical History  Diagnosis Date  . Cancer   . Hypertension   . Asthma   . Arthritis     ALLERGIES:  is allergic to aspirin.  MEDICATIONS:  Current Outpatient Prescriptions  Medication Sig Dispense Refill  . carisoprodol (SOMA) 350 MG tablet Take 175 mg by mouth 4 (four) times daily as needed for muscle spasms (muscle spasm). For muscle pain.    . metoprolol tartrate (LOPRESSOR) 25 MG tablet Take 25 mg by mouth 2 (two) times daily.    Marland Kitchen oxyCODONE (ROXICODONE) 15 MG immediate release tablet Take 15 mg by mouth every 4 (four) hours as needed for pain (pain).     Marland Kitchen albuterol (PROVENTIL HFA;VENTOLIN HFA) 108 (90 BASE) MCG/ACT inhaler Inhale 2 puffs into the lungs every 6 (six) hours as needed. For shortness of breath.    . oxyCODONE-acetaminophen (PERCOCET) 10-325 MG per tablet Take 1 tablet by mouth every 6 (six) hours as needed for pain. For pain. (Patient not taking: Reported on 11/16/2014) 20 tablet 0   No current facility-administered medications for this visit.    SURGICAL HISTORY:  Past Surgical History  Procedure Laterality Date  . Appendectomy    . Tubal ligation    . Cholecystectomy      REVIEW OF SYSTEMS:  A comprehensive review of systems was negative except for: Constitutional: positive for fatigue Musculoskeletal: positive for arthralgias and muscle weakness   PHYSICAL EXAMINATION: General appearance: alert, cooperative, fatigued and no distress Head: Normocephalic, without obvious abnormality, atraumatic Neck: no adenopathy Lymph nodes: Cervical, supraclavicular, and axillary nodes normal. Resp: clear to auscultation bilaterally Cardio: regular rate and rhythm, S1, S2 normal, no murmur, click, rub or gallop GI: soft, non-tender; bowel sounds normal; no masses,  no organomegaly Extremities: extremities normal, atraumatic, no cyanosis or edema   ECOG PERFORMANCE STATUS: 1 - Symptomatic but completely ambulatory  Blood pressure 145/109, pulse 103, temperature 98.6 F (37 C), temperature source Oral, resp. rate 18, height _0  (1.499 m), weight 144 lb 3.2 oz (65.409 kg), SpO2 100 %.  LABORATORY DATA: Lab Results  Component Value Date   WBC 6.7 11/08/2014   HGB 11.5* 11/08/2014  HCT 36.1 11/08/2014   MCV 92.0 11/08/2014   PLT 135* 11/08/2014      Chemistry      Component Value Date/Time   NA 143 11/08/2014 1343   NA 142 09/18/2014 1548   K 3.3* 11/08/2014 1343   K 3.2* 09/18/2014 1548   CL 101 09/18/2014 1548   CL 104 01/19/2013 1058   CO2 28 11/08/2014 1343   CO2 28 09/18/2014 1548   BUN 9.4 11/08/2014 1343     BUN 12 09/18/2014 1548   CREATININE 0.7 11/08/2014 1343   CREATININE 0.64 09/18/2014 1548      Component Value Date/Time   CALCIUM 8.6 11/08/2014 1343   CALCIUM 9.0 09/18/2014 1548   ALKPHOS 100 11/08/2014 1343   ALKPHOS 90 09/18/2014 1548   AST 33 11/08/2014 1343   AST 35 09/18/2014 1548   ALT 20 11/08/2014 1343   ALT 38* 09/18/2014 1548   BILITOT 0.75 11/08/2014 1343   BILITOT 1.0 09/18/2014 1548     Other lab results: Beta-2 microglobulin 1.66, free kappa light chain 2.13, free lambda light chain 2.38, kappa/lambda ratio 0.89. IgG 1500, IgA 279 and IgM 148.  RADIOGRAPHIC STUDIES: No results found.  ASSESSMENT AND PLAN: This is a very pleasant 69 years old African American female with history of multiple myeloma status post systemic chemotherapy with Revlimid and Decadron followed by this peripheral blood stem cell transplant and has been observation since June of 2009 with no significant evidence for disease progression. Her myeloma panel was unremarkable for any disease progression. I discussed the lab result with the patient today and recommended for her to continue on observation with repeat myeloma panel in 6 months.  She will continue her treatment with Zometa every 3 months.  She was advised to call immediately if she has any concerning symptoms in the interval.  All questions were answered. The patient knows to call the clinic with any problems, questions or concerns. We can certainly see the patient much sooner if necessary.  Disclaimer: This note was dictated with voice recognition software. Similar sounding words can inadvertently be transcribed and may not be corrected upon review.

## 2014-11-17 ENCOUNTER — Telehealth: Payer: Self-pay | Admitting: Internal Medicine

## 2014-11-17 NOTE — Telephone Encounter (Signed)
Confirmed appointment for April. Mailed calendar.

## 2014-11-17 NOTE — Telephone Encounter (Signed)
added appt for [facility]

## 2015-01-07 DIAGNOSIS — F419 Anxiety disorder, unspecified: Secondary | ICD-10-CM | POA: Diagnosis not present

## 2015-01-07 DIAGNOSIS — M545 Low back pain: Secondary | ICD-10-CM | POA: Diagnosis not present

## 2015-01-07 DIAGNOSIS — C9000 Multiple myeloma not having achieved remission: Secondary | ICD-10-CM | POA: Diagnosis not present

## 2015-01-07 DIAGNOSIS — M160 Bilateral primary osteoarthritis of hip: Secondary | ICD-10-CM | POA: Diagnosis not present

## 2015-01-12 DIAGNOSIS — J209 Acute bronchitis, unspecified: Secondary | ICD-10-CM | POA: Diagnosis not present

## 2015-01-12 DIAGNOSIS — J069 Acute upper respiratory infection, unspecified: Secondary | ICD-10-CM | POA: Diagnosis not present

## 2015-01-12 DIAGNOSIS — F419 Anxiety disorder, unspecified: Secondary | ICD-10-CM | POA: Diagnosis not present

## 2015-01-12 DIAGNOSIS — C9000 Multiple myeloma not having achieved remission: Secondary | ICD-10-CM | POA: Diagnosis not present

## 2015-02-07 DIAGNOSIS — M545 Low back pain: Secondary | ICD-10-CM | POA: Diagnosis not present

## 2015-02-07 DIAGNOSIS — F419 Anxiety disorder, unspecified: Secondary | ICD-10-CM | POA: Diagnosis not present

## 2015-02-07 DIAGNOSIS — M160 Bilateral primary osteoarthritis of hip: Secondary | ICD-10-CM | POA: Diagnosis not present

## 2015-02-07 DIAGNOSIS — L900 Lichen sclerosus et atrophicus: Secondary | ICD-10-CM | POA: Diagnosis not present

## 2015-02-08 ENCOUNTER — Telehealth: Payer: Self-pay | Admitting: *Deleted

## 2015-02-08 ENCOUNTER — Other Ambulatory Visit (HOSPITAL_BASED_OUTPATIENT_CLINIC_OR_DEPARTMENT_OTHER): Payer: Medicare Other

## 2015-02-08 ENCOUNTER — Other Ambulatory Visit: Payer: Self-pay | Admitting: Medical Oncology

## 2015-02-08 ENCOUNTER — Ambulatory Visit (HOSPITAL_BASED_OUTPATIENT_CLINIC_OR_DEPARTMENT_OTHER): Payer: Medicare Other

## 2015-02-08 VITALS — BP 130/84 | HR 120 | Temp 98.0°F | Resp 20

## 2015-02-08 DIAGNOSIS — C9001 Multiple myeloma in remission: Secondary | ICD-10-CM | POA: Diagnosis present

## 2015-02-08 LAB — COMPREHENSIVE METABOLIC PANEL (CC13)
ALK PHOS: 80 U/L (ref 40–150)
ALT: 22 U/L (ref 0–55)
AST: 31 U/L (ref 5–34)
Albumin: 3.4 g/dL — ABNORMAL LOW (ref 3.5–5.0)
Anion Gap: 11 mEq/L (ref 3–11)
BUN: 10.9 mg/dL (ref 7.0–26.0)
CALCIUM: 8.5 mg/dL (ref 8.4–10.4)
CO2: 23 mEq/L (ref 22–29)
CREATININE: 0.7 mg/dL (ref 0.6–1.1)
Chloride: 109 mEq/L (ref 98–109)
EGFR: >90 mL/min/{1.73_m2} (ref >90)
Glucose: 92 mg/dl (ref 70–140)
POTASSIUM: 3.1 meq/L — AB (ref 3.5–5.1)
Sodium: 143 mEq/L (ref 136–145)
Total Bilirubin: 1 mg/dL (ref 0.20–1.20)
Total Protein: 6.8 g/dL (ref 6.4–8.3)

## 2015-02-08 MED ORDER — POTASSIUM CHLORIDE CRYS ER 20 MEQ PO TBCR: 20.0000 meq | EXTENDED_RELEASE_TABLET | Freq: Every day | ORAL | Status: DC

## 2015-02-08 MED ORDER — ZOLEDRONIC ACID 4 MG/100ML IV SOLN
4.0000 mg | Freq: Once | INTRAVENOUS | Status: AC
  Administered 2015-02-08: 4 mg via INTRAVENOUS
  Filled 2015-02-08: qty 100

## 2015-02-08 NOTE — Telephone Encounter (Signed)
Called pt with potassium results, instructed pt to take K-Dur 1 tablet x 7days. Pt verbalized she will pick up rx today. No further concerns.

## 2015-02-08 NOTE — Patient Instructions (Signed)

## 2015-02-08 NOTE — Progress Notes (Signed)
Quick Note:  Call patient with the result and order K dur 20 meq po qd x 7 days ______ 

## 2015-02-08 NOTE — Telephone Encounter (Signed)
-----   Message from Curt Bears, MD sent at 02/08/2015  4:11 PM EDT ----- Call patient with the result and order K dur 20 meq po qd x 7 days.

## 2015-02-23 ENCOUNTER — Telehealth: Payer: Self-pay | Admitting: Internal Medicine

## 2015-02-23 NOTE — Telephone Encounter (Signed)
Faxed pt medical records to [facility] [phone]

## 2015-03-23 ENCOUNTER — Telehealth: Payer: Self-pay | Admitting: *Deleted

## 2015-03-23 ENCOUNTER — Encounter (HOSPITAL_COMMUNITY): Payer: Self-pay | Admitting: Emergency Medicine

## 2015-03-23 ENCOUNTER — Emergency Department (HOSPITAL_COMMUNITY)
Admission: EM | Admit: 2015-03-23 | Discharge: 2015-03-23 | Disposition: A | Discharge status: Home / Self Care | Payer: Medicare Other | Attending: Emergency Medicine | Admitting: Emergency Medicine

## 2015-03-23 DIAGNOSIS — E876 Hypokalemia: Secondary | ICD-10-CM

## 2015-03-23 DIAGNOSIS — I10 Essential (primary) hypertension: Secondary | ICD-10-CM | POA: Insufficient documentation

## 2015-03-23 DIAGNOSIS — G8929 Other chronic pain: Secondary | ICD-10-CM | POA: Diagnosis not present

## 2015-03-23 DIAGNOSIS — M1990 Unspecified osteoarthritis, unspecified site: Secondary | ICD-10-CM | POA: Insufficient documentation

## 2015-03-23 DIAGNOSIS — Z79899 Other long term (current) drug therapy: Secondary | ICD-10-CM | POA: Diagnosis not present

## 2015-03-23 DIAGNOSIS — C9000 Multiple myeloma not having achieved remission: Secondary | ICD-10-CM | POA: Diagnosis not present

## 2015-03-23 DIAGNOSIS — J45909 Unspecified asthma, uncomplicated: Secondary | ICD-10-CM | POA: Diagnosis not present

## 2015-03-23 DIAGNOSIS — R6889 Other general symptoms and signs: Secondary | ICD-10-CM | POA: Diagnosis not present

## 2015-03-23 DIAGNOSIS — I4891 Unspecified atrial fibrillation: Secondary | ICD-10-CM | POA: Diagnosis not present

## 2015-03-23 DIAGNOSIS — M549 Dorsalgia, unspecified: Secondary | ICD-10-CM | POA: Diagnosis present

## 2015-03-23 DIAGNOSIS — I499 Cardiac arrhythmia, unspecified: Secondary | ICD-10-CM | POA: Diagnosis not present

## 2015-03-23 DIAGNOSIS — M545 Low back pain: Secondary | ICD-10-CM | POA: Insufficient documentation

## 2015-03-23 DIAGNOSIS — M79605 Pain in left leg: Secondary | ICD-10-CM | POA: Diagnosis not present

## 2015-03-23 LAB — CBC
HCT: 38.8 % (ref 36.0–46.0)
Hemoglobin: 12.9 g/dL (ref 12.0–15.0)
MCH: 30 pg (ref 26.0–34.0)
MCHC: 33.2 g/dL (ref 30.0–36.0)
MCV: 90.2 fL (ref 78.0–100.0)
PLATELETS: 161 10*3/uL (ref 150–400)
RBC: 4.3 MIL/uL (ref 3.87–5.11)
RDW: 13.8 % (ref 11.5–15.5)
WBC: 10.8 10*3/uL — AB (ref 4.0–10.5)

## 2015-03-23 LAB — COMPREHENSIVE METABOLIC PANEL
ALBUMIN: 3.7 g/dL (ref 3.5–5.0)
ALT: 15 U/L (ref 14–54)
ANION GAP: 9 (ref 5–15)
AST: 19 U/L (ref 15–41)
Alkaline Phosphatase: 91 U/L (ref 38–126)
BILIRUBIN TOTAL: 0.8 mg/dL (ref 0.3–1.2)
BUN: 14 mg/dL (ref 6–20)
CO2: 28 mmol/L (ref 22–32)
Calcium: 9.2 mg/dL (ref 8.9–10.3)
Chloride: 104 mmol/L (ref 101–111)
Creatinine, Ser: 0.62 mg/dL (ref 0.44–1.00)
Glucose, Bld: 97 mg/dL (ref 65–99)
Potassium: 2.7 mmol/L — CL (ref 3.5–5.1)
Sodium: 141 mmol/L (ref 135–145)
Total Protein: 7.7 g/dL (ref 6.5–8.1)

## 2015-03-23 MED ORDER — OXYCODONE HCL 5 MG PO TABS
15.0000 mg | ORAL_TABLET | Freq: Once | ORAL | Status: AC
  Administered 2015-03-23: 15 mg via ORAL
  Filled 2015-03-23: qty 3

## 2015-03-23 MED ORDER — POTASSIUM CHLORIDE CRYS ER 20 MEQ PO TBCR: EXTENDED_RELEASE_TABLET | ORAL | Status: DC

## 2015-03-23 MED ORDER — POTASSIUM CHLORIDE 10 MEQ/100ML IV SOLN
10.0000 meq | Freq: Once | INTRAVENOUS | Status: AC
  Administered 2015-03-23: 10 meq via INTRAVENOUS
  Filled 2015-03-23: qty 100

## 2015-03-23 MED ORDER — OXYCODONE HCL 15 MG PO TABS: 15.0000 mg | ORAL_TABLET | Freq: Four times a day (QID) | ORAL | Status: DC | PRN

## 2015-03-23 MED ORDER — HYDROMORPHONE HCL 1 MG/ML IJ SOLN
0.5000 mg | Freq: Once | INTRAMUSCULAR | Status: AC
  Administered 2015-03-23: 0.5 mg via INTRAVENOUS
  Filled 2015-03-23: qty 1

## 2015-03-23 MED ORDER — POTASSIUM CHLORIDE CRYS ER 20 MEQ PO TBCR
40.0000 meq | EXTENDED_RELEASE_TABLET | Freq: Once | ORAL | Status: AC
  Administered 2015-03-23: 40 meq via ORAL
  Filled 2015-03-23: qty 2

## 2015-03-23 NOTE — Discharge Instructions (Signed)
It was our pleasure to provide your ER care today - we hope that you feel better.  Take your oxycodone as need, as prescribed, for pain - no driving when taking. Do not exceed the prescribed dose.  From today's lab tests, your potassium level is low (2.7) - eat plenty of fruits and vegetables, take potassium supplement as prescribed, and follow up with your doctor for recheck in the next few days.  Follow up with your doctor in the next 2-3 days - call office to arrange appointment.  Also have your blood pressure rechecked then as it is high today.   Your heart rhythm today is 'atrial fibrillation' (your last 2 ecgs from 2014 and 2015 also show atrial fibrillation) - as we discussed, this rhythm increases your risk of stroke.  Follow up with cardiologist in the atrial fibrillation clinic in the coming week - see referral - call office to arrange appointment time.   We did send your myeloma panel during your ED visit today - the results are not yet back - inform Dr Earlie Server that labs were sent today, as they may be able to cancel your already ordered labs prior to your next clinic visit.   Return to ER if worse, new symptoms, fevers, intractable pain, leg numbness/weakness, other concern.  You were given pain medication in the ER - no driving for the next 4 hours.     Chronic Back Pain  When back pain lasts longer than 3 months, it is called chronic back pain.People with chronic back pain often go through certain periods that are more intense (flare-ups).  CAUSES Chronic back pain can be caused by wear and tear (degeneration) on different structures in your back. These structures include:  The bones of your spine (vertebrae) and the joints surrounding your spinal cord and nerve roots (facets).  The strong, fibrous tissues that connect your vertebrae (ligaments). Degeneration of these structures may result in pressure on your nerves. This can lead to constant pain. HOME CARE  INSTRUCTIONS  Avoid bending, heavy lifting, prolonged sitting, and activities which make the problem worse.  Take brief periods of rest throughout the day to reduce your pain. Lying down or standing usually is better than sitting while you are resting.  Take over-the-counter or prescription medicines only as directed by your caregiver. SEEK IMMEDIATE MEDICAL CARE IF:   You have weakness or numbness in one of your legs or feet.  You have trouble controlling your bladder or bowels.  You have nausea, vomiting, abdominal pain, shortness of breath, or fainting. Document Released: 11/15/2004 Document Revised: 12/31/2011 Document Reviewed: 09/22/2011 [facility] Patient Information 2015 [place], [place]. This information is not intended to replace advice given to you by your health care provider. Make sure you discuss any questions you have with your health care provider.    Multiple Myeloma Multiple myeloma is the most common cancer of bone. It is caused by the uncontrolled multiplication of a type of white blood cell in the marrow. This white blood cell is called a plasma cell. This means the bone marrow is overworking producing plasma cells. Soon these overproduced cells begin to take up room in the marrow that is needed by other cells. This means that there are soon not enough red or white blood cells or platelets. Not enough red cells mean that the person is anemic. There are not enough red blood cells to carry oxygen around the body. There are not enough white blood cells to fight disease. This causes  the person with multiple myeloma to not feel well. There is also bone pain through much of the body. SYMPTOMS  Anemia causes fatigue (tiredness) and weakness.  Back pain is common. This is from fractures (break in bones) caused by damage to the bones of the back.  Lack of white blood cells makes infection more likely.  Bleeding is a common problem from lack of the cells (platelets). Platelets  help blood clots form. This may show up as bleeding from any place. Commonly this shows up as bleeding from the nose or gums.  Fractures (bone breaks) are more common anywhere. The back and ribs are the most commonly fractured areas. DIAGNOSIS  This tumor is often suggested by blood tests. Often doing a bone marrow sample makes the diagnosis (learning what is wrong). This is a test performed by taking a small sample of bone with a small needle. This bone often comes from the sternum (breast bone). This sample is sent to a pathologist (a specialist in looking at tissue under a microscope). After looking at the sample under the microscope, the pathologist is able to make a diagnosis of the problem. X-rays may also show boney changes. TREATMENT   Occasionally, anti-cancer medications may be used with multiple myeloma. Your caregiver can discuss this with you.  Medications can also be given to help with the bone pain.  There is no cure for multiple myeloma. Lifestyle changes can add years of quality living. HOME CARE INSTRUCTIONS  Often there is no specific treatment for multiple myeloma. Most of the treatment consists of adjustments in dietary and living activities. Some of these changes include:  Your dietitian or caregiver helping you with your dietary questions.  Taking iron and vitamins as prescribed by your caregiver.  Eating a well balanced diet.  Staying active, but follow restrictions suggested by your caregiver. Avoiding heavy lifting (more than 10 pounds) and activities that cause increased pain.  Drinking plenty of water.  Using back braces and a cane may help with some of the boney pain. SEEK IMMEDIATE MEDICAL CARE IF:  You develop severe, uncontrolled boney pain.  You or your family notices confusion, problems with decision-making or inability to stay awake.  You notice increased urination or constipation.  You notice problems holding your water or stool.  You have  numbness or loss of control of your extremities (arms/hands or legs/feet). Document Released: 07/03/2001 Document Revised: 12/31/2011 Document Reviewed: 10/03/2008 [facility] Patient Information 2015 [place], [place]. This information is not intended to replace advice given to you by your health care provider. Make sure you discuss any questions you have with your health care provider.    Hypokalemia Hypokalemia means that the amount of potassium in the blood is lower than normal.Potassium is a chemical, called an electrolyte, that helps regulate the amount of fluid in the body. It also stimulates muscle contraction and helps nerves function properly.Most of the body's potassium is inside of cells, and only a very small amount is in the blood. Because the amount in the blood is so small, minor changes can be life-threatening. CAUSES  Antibiotics.  Diarrhea or vomiting.  Using laxatives too much, which can cause diarrhea.  Chronic kidney disease.  Water pills (diuretics).  Eating disorders (bulimia).  Low magnesium level.  Sweating a lot. SIGNS AND SYMPTOMS  Weakness.  Constipation.  Fatigue.  Muscle cramps.  Mental confusion.  Skipped heartbeats or irregular heartbeat (palpitations).  Tingling or numbness. DIAGNOSIS  Your health care provider can diagnose hypokalemia with  blood tests. In addition to checking your potassium level, your health care provider may also check other lab tests. TREATMENT Hypokalemia can be treated with potassium supplements taken by mouth or adjustments in your current medicines. If your potassium level is very low, you may need to get potassium through a vein (IV) and be monitored in the hospital. A diet high in potassium is also helpful. Foods high in potassium are:  Nuts, such as peanuts and pistachios.  Seeds, such as sunflower seeds and pumpkin seeds.  Peas, lentils, and lima beans.  Whole grain and bran cereals and breads.  Fresh  fruit and vegetables, such as apricots, avocado, bananas, cantaloupe, kiwi, oranges, tomatoes, asparagus, and potatoes.  Orange and tomato juices.  Red meats.  Fruit yogurt. HOME CARE INSTRUCTIONS  Take all medicines as prescribed by your health care provider.  Maintain a healthy diet by including nutritious food, such as fruits, vegetables, nuts, whole grains, and lean meats.  If you are taking a laxative, be sure to follow the directions on the label. SEEK MEDICAL CARE IF:  Your weakness gets worse.  You feel your heart pounding or racing.  You are vomiting or having diarrhea.  You are diabetic and having trouble keeping your blood glucose in the normal range. SEEK IMMEDIATE MEDICAL CARE IF:  You have chest pain, shortness of breath, or dizziness.  You are vomiting or having diarrhea for more than 2 days.  You faint. MAKE SURE YOU:   Understand these instructions.  Will watch your condition.  Will get help right away if you are not doing well or get worse. Document Released: 10/08/2005 Document Revised: 07/29/2013 Document Reviewed: 04/10/2013 [facility] Patient Information 2015 [place], [place]. This information is not intended to replace advice given to you by your health care provider. Make sure you discuss any questions you have with your health care provider.   Atrial Fibrillation Atrial fibrillation is a type of irregular heart rhythm (arrhythmia). During atrial fibrillation, the upper chambers of the heart (atria) quiver continuously in a chaotic pattern. This causes an irregular and often rapid heart rate.  Atrial fibrillation is the result of the heart becoming overloaded with disorganized signals that tell it to beat. These signals are normally released one at a time by a part of the right atrium called the sinoatrial node. They then travel from the atria to the lower chambers of the heart (ventricles), causing the atria and ventricles to contract and pump blood as  they pass. In atrial fibrillation, parts of the atria outside of the sinoatrial node also release these signals. This results in two problems. First, the atria receive so many signals that they do not have time to fully contract. Second, the ventricles, which can only receive one signal at a time, beat irregularly and out of rhythm with the atria.  There are three types of atrial fibrillation:   Paroxysmal. Paroxysmal atrial fibrillation starts suddenly and stops on its own within a week.  Persistent. Persistent atrial fibrillation lasts for more than a week. It may stop on its own or with treatment.  Permanent. Permanent atrial fibrillation does not go away. Episodes of atrial fibrillation may lead to permanent atrial fibrillation. Atrial fibrillation can prevent your heart from pumping blood normally. It increases your risk of stroke and can lead to heart failure.  CAUSES   Heart conditions, including a heart attack, heart failure, coronary artery disease, and heart valve conditions.   Inflammation of the sac that surrounds the heart (pericarditis).  Blockage of an artery in the lungs (pulmonary embolism).  Pneumonia or other infections.  Chronic lung disease.  Thyroid problems, especially if the thyroid is overactive (hyperthyroidism).  Caffeine, excessive alcohol use, and use of some illegal drugs.   Use of some medicines, including certain decongestants and diet pills.  Heart surgery.   Birth defects.  Sometimes, no cause can be found. When this happens, the atrial fibrillation is called lone atrial fibrillation. The risk of complications from atrial fibrillation increases if you have lone atrial fibrillation and you are age 80 years or older. RISK FACTORS  Heart failure.  Coronary artery disease.  Diabetes mellitus.   High blood pressure (hypertension).   Obesity.   Other arrhythmias.   Increased age. SIGNS AND SYMPTOMS   A feeling that your heart is  beating rapidly or irregularly.   A feeling of discomfort or pain in your chest.   Shortness of breath.   Sudden light-headedness or weakness.   Getting tired easily when exercising.   Urinating more often than normal (mainly when atrial fibrillation first begins).  In paroxysmal atrial fibrillation, symptoms may start and suddenly stop. DIAGNOSIS  Your health care provider may be able to detect atrial fibrillation when taking your pulse. Your health care provider may have you take a test called an ambulatory electrocardiogram (ECG). An ECG records your heartbeat patterns over a 24-hour period. You may also have other tests, such as:  Transthoracic echocardiogram (TTE). During echocardiography, sound waves are used to evaluate how blood flows through your heart.  Transesophageal echocardiogram (TEE).  Stress test. There is more than one type of stress test. If a stress test is needed, ask your health care provider about which type is best for you.  Chest X-ray exam.  Blood tests.  Computed tomography (CT). TREATMENT  Treatment may include:  Treating any underlying conditions. For example, if you have an overactive thyroid, treating the condition may correct atrial fibrillation.  Taking medicine. Medicines may be given to control a rapid heart rate or to prevent blood clots, heart failure, or a stroke.  Having a procedure to correct the rhythm of the heart:  Electrical cardioversion. During electrical cardioversion, a controlled, low-energy shock is delivered to the heart through your skin. If you have chest pain, very low blood pressure, or sudden heart failure, this procedure may need to be done as an emergency.  Catheter ablation. During this procedure, heart tissues that send the signals that cause atrial fibrillation are destroyed.  Surgical ablation. During this surgery, thin lines of heart tissue that carry the abnormal signals are destroyed. This procedure can  either be an open-heart surgery or a minimally invasive surgery. With the minimally invasive surgery, small cuts are made to access the heart instead of a large opening.  Pulmonary venous isolation. During this surgery, tissue around the veins that carry blood from the lungs (pulmonary veins) is destroyed. This tissue is thought to carry the abnormal signals. HOME CARE INSTRUCTIONS   Take medicines only as directed by your health care provider. Some medicines can make atrial fibrillation worse or recur.  If blood thinners were prescribed by your health care provider, take them exactly as directed. Too much blood-thinning medicine can cause bleeding. If you take too little, you will not have the needed protection against stroke and other problems.  Perform blood tests at home if directed by your health care provider. Perform blood tests exactly as directed.  Quit smoking if you smoke.  Do not drink  alcohol.  Do not drink caffeinated beverages such as coffee, soda, and some teas. You may drink decaffeinated coffee, soda, or tea.   Maintain a healthy weight.Do not use diet pills unless your health care provider approves. They may make heart problems worse.   Follow diet instructions as directed by your health care provider.  Exercise regularly as directed by your health care provider.  Keep all follow-up visits as directed by your health care provider. This is important. PREVENTION  The following substances can cause atrial fibrillation to recur:   Caffeinated beverages.  Alcohol.  Certain medicines, especially those used for breathing problems.  Certain herbs and herbal medicines, such as those containing ephedra or ginseng.  Illegal drugs, such as cocaine and amphetamines. Sometimes medicines are given to prevent atrial fibrillation from recurring. Proper treatment of any underlying condition is also important in helping prevent recurrence.  SEEK MEDICAL CARE IF:  You notice  a change in the rate, rhythm, or strength of your heartbeat.  You suddenly begin urinating more frequently.  You tire more easily when exerting yourself or exercising. SEEK IMMEDIATE MEDICAL CARE IF:   You have chest pain, abdominal pain, sweating, or weakness.  You feel nauseous.  You have shortness of breath.  You suddenly have swollen feet and ankles.  You feel dizzy.  Your face or limbs feel numb or weak.  You have a change in your vision or speech. MAKE SURE YOU:   Understand these instructions.  Will watch your condition.  Will get help right away if you are not doing well or get worse. Document Released: 10/08/2005 Document Revised: 02/22/2014 Document Reviewed: 11/18/2012 [facility] Patient Information 2015 [place], [place]. This information is not intended to replace advice given to you by your health care provider. Make sure you discuss any questions you have with your health care provider.

## 2015-03-23 NOTE — ED Notes (Signed)
Per EMS pt comes from [facility] for medication refill of her pain meds.  Pt has been out of pain meds since April but her PCP is [place] of the country and wont be back in for two more weeks.  Pt called her oncologist but wont refill her meds until she was seen here and evaluated.  Pt has bone cancer.

## 2015-03-23 NOTE — Telephone Encounter (Signed)
Received voice mail from patient stating,"I can't walk on my left leg. It's numb and tingling and I've already fallen. I've got pain under my left breast, ribs and back. I'm a hurtin all over. My husband says my cancer is back. I need an appointment to see Dr. Julien Nordmann. Return number is [phone]. DOB is Oct 10, 1946." I spoke with the patient and instructed her to call 911. Patient stated, "I just need my pain medication adjusted and then I'll be able to walk." Informed patient that pain medications aren't going to resolve the numbness, tingling and ability for her to walk. I reinforced that she needs to be seen to make sure there is no nerve damage, broken bones or other injuries. Patient stated,"you need to tell my husband to call 911." Spoke with husband and he stated," [facility] is an ambulance. The ambulance isn't going to do anything." Instructed husband to give the phone back to his wife. I spoke with the patient and said, "Mrs. Sax are you afraid to call 911?" She stated, "yes. I haven't paid my other bills yet. Will the ER people see me even though I'm behind on my bills?" I stated,"yes, we will see you. Don't worry about the bills. I will call 911 for you." She stated,"please call for me. Thank you, Erline Levine, for helping me out. I'm hurting all over. Make sure Dr. Julien Nordmann knows I'm coming to the ER." I told patient that I would tell Dr. Julien Nordmann. We then hung up and I dialed 911 from my desk at the [facility]. Phone number [phone], Dr. Hazeline Junker nurse today.

## 2015-03-23 NOTE — ED Provider Notes (Addendum)
CSN: [id number]     Arrival date & time 03/23/15  1252 History   First MD Initiated Contact with Patient 03/23/15 1319     Chief Complaint  Patient presents with  . Medication Refill     (Consider location/radiation/quality/duration/timing/severity/associated sxs/prior Treatment) The history is provided by the patient.  Patient w hx multiple myeloma, s/p remote hx stem cell transplant and chemo several yrs ago.  Pt notes constant back/rib pain since then. States for years was taking percocet for the pain, which eventually quite helping. States for the past several months her doctors have had her on oxycodone 15 mg q 6 hrs with good relief.  Pt states she recently ran out, has tried ibuprofen without relief. No acute or abrupt change in, or worsening of her pain. Denies fall or acute back injury. States on Sunday had some tingling pain down her leg, but no current numbness/weakness, or loss of normal function.  Pt states she tried to call her doctors office today for pain rx refill, but was instead instructed to come to ED for evaluation.  Pt indicates apart from her low back pain she otherwise feels well, at baseline. No fever or chills. No urinary retention, or incontinence. No saddle anesthesia. No radicular pain.      Past Medical History  Diagnosis Date  . Cancer   . Hypertension   . Asthma   . Arthritis    Past Surgical History  Procedure Laterality Date  . Appendectomy    . Tubal ligation    . Cholecystectomy     No family history on file. History  Substance Use Topics  . Smoking status: Never Smoker   . Smokeless tobacco: Never Used  . Alcohol Use: No   OB History    No data available     Review of Systems  Constitutional: Negative for fever and chills.  HENT: Negative for sore throat.   Eyes: Negative for redness.  Respiratory: Negative for shortness of breath.   Cardiovascular: Negative for chest pain.  Gastrointestinal: Negative for vomiting and abdominal pain.   Genitourinary: Negative for difficulty urinating.  Musculoskeletal: Positive for back pain. Negative for neck pain.  Skin: Negative for rash.  Neurological: Negative for weakness and numbness.  Hematological: Does not bruise/bleed easily.  Psychiatric/Behavioral: Negative for confusion.      Allergies  Aspirin  Home Medications   Prior to Admission medications   Medication Sig Start Date End Date Taking? Authorizing Provider  albuterol (PROVENTIL HFA;VENTOLIN HFA) 108 (90 BASE) MCG/ACT inhaler Inhale 2 puffs into the lungs every 6 (six) hours as needed. For shortness of breath.    Historical Provider, MD  carisoprodol (SOMA) 350 MG tablet Take 175 mg by mouth 4 (four) times daily as needed for muscle spasms (muscle spasm). For muscle pain.    Historical Provider, MD  metoprolol tartrate (LOPRESSOR) 25 MG tablet Take 25 mg by mouth 2 (two) times daily.    Historical Provider, MD  oxyCODONE (ROXICODONE) 15 MG immediate release tablet Take 15 mg by mouth every 4 (four) hours as needed for pain (pain).    Historical Provider, MD  oxyCODONE-acetaminophen (PERCOCET) 10-325 MG per tablet Take 1 tablet by mouth every 6 (six) hours as needed for pain. For pain. Patient not taking: Reported on 11/16/2014 01/15/13   Curt Bears, MD  potassium chloride SA (K-DUR,KLOR-CON) 20 MEQ tablet Take 1 tablet (20 mEq total) by mouth daily. 02/08/15   Curt Bears, MD   BP 175/89 mmHg  Pulse  97  Temp(Src) 98.7 F (37.1 C) (Oral)  Resp 19  SpO2 100% Physical Exam  Constitutional: She is oriented to person, place, and time. She appears well-developed and well-nourished. No distress.  HENT:  Mouth/Throat: Oropharynx is clear and moist.  Eyes: Conjunctivae are normal. No scleral icterus.  Neck: Neck supple. No tracheal deviation present.  Cardiovascular: Normal rate, normal heart sounds and intact distal pulses.   Irregular rhythm  Pulmonary/Chest: Effort normal and breath sounds normal. No  respiratory distress.  Abdominal: Soft. Normal appearance. She exhibits no distension. There is no tenderness.  Genitourinary:  No cva tenderness  Musculoskeletal: She exhibits no edema.  TLS spine non tender, aligned. Lumbar muscular tenderness.   Neurological: She is alert and oriented to person, place, and time.  Motor intact bil lower ext, stre 5/5. sens grossly intact. Straight leg raise neg.   Skin: Skin is warm and dry. No rash noted. She is not diaphoretic.  Psychiatric: She has a normal mood and affect.  Nursing note and vitals reviewed.   ED Course  Procedures (including critical care time) Labs Review  Results for orders placed or performed during the hospital encounter of 03/23/15  CBC  Result Value Ref Range   WBC 10.8 (H) 4.0 - 10.5 K/uL   RBC 4.30 3.87 - 5.11 MIL/uL   Hemoglobin 12.9 12.0 - 15.0 g/dL   HCT 38.8 36.0 - 46.0 %   MCV 90.2 78.0 - 100.0 fL   MCH 30.0 26.0 - 34.0 pg   MCHC 33.2 30.0 - 36.0 g/dL   RDW 13.8 11.5 - 15.5 %   Platelets 161 150 - 400 K/uL  Comprehensive metabolic panel  Result Value Ref Range   Sodium 141 135 - 145 mmol/L   Potassium 2.7 (LL) 3.5 - 5.1 mmol/L   Chloride 104 101 - 111 mmol/L   CO2 28 22 - 32 mmol/L   Glucose, Bld 97 65 - 99 mg/dL   BUN 14 6 - 20 mg/dL   Creatinine, Ser 0.62 0.44 - 1.00 mg/dL   Calcium 9.2 8.9 - 10.3 mg/dL   Total Protein 7.7 6.5 - 8.1 g/dL   Albumin 3.7 3.5 - 5.0 g/dL   AST 19 15 - 41 U/L   ALT 15 14 - 54 U/L   Alkaline Phosphatase 91 38 - 126 U/L   Total Bilirubin 0.8 0.3 - 1.2 mg/dL   GFR calc non Af Amer >60 >60 mL/min   GFR calc Af Amer >60 >60 mL/min   Anion gap 9 5 - 15       MDM   Labs.  Pt states takes oxycodone 15 mg q 6 hrs, has empty bottle, verified dose.  Oxycodone 15 mg po.  Pt questions re whether normal labs/myeloma panel can be sent today - will send labs.  Reviewed nursing notes and prior charts for additional history.   From today's labs. k low 2.7. Hx same, but  was not currently taking k replacement.   kcl 10 meq iv. kcl 40 meq po.  Pt is eating and drinking during her ED stay.  Is ambulatory in ED.  Spine nt.   ecg w afib - of note last 2 ecgs also w afib/unchanged from current, dating back to 2014.   Pt w ? Allergy to pcn, and indicates had not wanted to take anticoag med in past. CHADS2 score 1.  Discussed cva risk w pt. For present time, pt elects not to be on anticoag.  I did call  afib clinic, they took pts names, dob and indicate they will see in clinic and discussed tx options w pt again.   Pt requests med for pain. Dilaudid . 5 mg iv.   Anticipate d/c post kcl.  Pain is improved/controlled. Will give rx for home.           Lajean Saver, MD 03/23/15 [phone]

## 2015-03-23 NOTE — ED Notes (Signed)
Bed: [facility] Expected date:  Expected time:  Means of arrival:  Comments: 

## 2015-03-25 ENCOUNTER — Telehealth (HOSPITAL_COMMUNITY): Payer: Self-pay | Admitting: *Deleted

## 2015-03-25 NOTE — Telephone Encounter (Signed)
Left message for pt to call and schedule appt for follow up from ER regarding afib.

## 2015-03-28 LAB — MULTIPLE MYELOMA PANEL, SERUM
ALBUMIN SERPL ELPH-MCNC: 3.5 g/dL (ref 3.2–5.6)
Albumin/Glob SerPl: 0.9 (ref 0.7–2.0)
Alpha 1: 0.3 g/dL (ref 0.1–0.4)
Alpha2 Glob SerPl Elph-Mcnc: 0.8 g/dL (ref 0.4–1.2)
B-Globulin SerPl Elph-Mcnc: 1.2 g/dL (ref 0.6–1.3)
GAMMA GLOB SERPL ELPH-MCNC: 1.7 g/dL — AB (ref 0.5–1.6)
Globulin, Total: 3.9 g/dL (ref 2.0–4.5)
IGA: 323 mg/dL (ref 87–352)
IGG (IMMUNOGLOBIN G), SERUM: 1729 mg/dL — AB (ref 700–1600)
IgM, Serum: 197 mg/dL (ref 26–217)
Total Protein ELP: 7.4 g/dL (ref 6.0–8.5)

## 2015-03-30 ENCOUNTER — Emergency Department (HOSPITAL_COMMUNITY)
Admission: EM | Admit: 2015-03-30 | Discharge: 2015-03-30 | Disposition: A | Discharge status: Home / Self Care | Payer: Medicare Other | Attending: Emergency Medicine | Admitting: Emergency Medicine

## 2015-03-30 ENCOUNTER — Encounter (HOSPITAL_COMMUNITY): Payer: Self-pay | Admitting: Emergency Medicine

## 2015-03-30 ENCOUNTER — Emergency Department (HOSPITAL_COMMUNITY): Payer: Medicare Other

## 2015-03-30 DIAGNOSIS — Z8739 Personal history of other diseases of the musculoskeletal system and connective tissue: Secondary | ICD-10-CM | POA: Insufficient documentation

## 2015-03-30 DIAGNOSIS — Y92009 Unspecified place in unspecified non-institutional (private) residence as the place of occurrence of the external cause: Secondary | ICD-10-CM | POA: Insufficient documentation

## 2015-03-30 DIAGNOSIS — S299XXA Unspecified injury of thorax, initial encounter: Secondary | ICD-10-CM | POA: Diagnosis not present

## 2015-03-30 DIAGNOSIS — I10 Essential (primary) hypertension: Secondary | ICD-10-CM | POA: Insufficient documentation

## 2015-03-30 DIAGNOSIS — Z85830 Personal history of malignant neoplasm of bone: Secondary | ICD-10-CM | POA: Diagnosis not present

## 2015-03-30 DIAGNOSIS — G893 Neoplasm related pain (acute) (chronic): Secondary | ICD-10-CM | POA: Insufficient documentation

## 2015-03-30 DIAGNOSIS — S3992XA Unspecified injury of lower back, initial encounter: Secondary | ICD-10-CM | POA: Diagnosis present

## 2015-03-30 DIAGNOSIS — W1839XA Other fall on same level, initial encounter: Secondary | ICD-10-CM | POA: Insufficient documentation

## 2015-03-30 DIAGNOSIS — J45909 Unspecified asthma, uncomplicated: Secondary | ICD-10-CM | POA: Insufficient documentation

## 2015-03-30 DIAGNOSIS — S39012A Strain of muscle, fascia and tendon of lower back, initial encounter: Secondary | ICD-10-CM | POA: Insufficient documentation

## 2015-03-30 DIAGNOSIS — Z79899 Other long term (current) drug therapy: Secondary | ICD-10-CM | POA: Insufficient documentation

## 2015-03-30 DIAGNOSIS — T1490XA Injury, unspecified, initial encounter: Secondary | ICD-10-CM

## 2015-03-30 DIAGNOSIS — Y9389 Activity, other specified: Secondary | ICD-10-CM | POA: Diagnosis not present

## 2015-03-30 DIAGNOSIS — T148XXA Other injury of unspecified body region, initial encounter: Secondary | ICD-10-CM

## 2015-03-30 DIAGNOSIS — Y999 Unspecified external cause status: Secondary | ICD-10-CM | POA: Diagnosis not present

## 2015-03-30 MED ORDER — OXYCODONE HCL 15 MG PO TABS: 15.0000 mg | ORAL_TABLET | Freq: Four times a day (QID) | ORAL | Status: DC | PRN

## 2015-03-30 MED ORDER — HYDROMORPHONE HCL 1 MG/ML IJ SOLN
1.0000 mg | Freq: Once | INTRAMUSCULAR | Status: AC
  Administered 2015-03-30: 1 mg via INTRAMUSCULAR
  Filled 2015-03-30: qty 1

## 2015-03-30 MED ORDER — DIAZEPAM 5 MG PO TABS
5.0000 mg | ORAL_TABLET | Freq: Once | ORAL | Status: AC
  Administered 2015-03-30: 5 mg via ORAL
  Filled 2015-03-30: qty 1

## 2015-03-30 MED ORDER — HYDROMORPHONE HCL 1 MG/ML IJ SOLN
2.0000 mg | Freq: Once | INTRAMUSCULAR | Status: AC
  Administered 2015-03-30: 2 mg via INTRAMUSCULAR
  Filled 2015-03-30: qty 2

## 2015-03-30 NOTE — Discharge Instructions (Signed)

## 2015-03-30 NOTE — ED Notes (Signed)
Pt states that last Thursday she was ambulating from dining room table to couch in her living room when her legs gave out underneath her and she fell hitting her back on her coffee table.  Pt has abrasion on her mid back area.  Pt states that she has lots of pain esp with movement in her ribs.  Pt has bone cancer she said.

## 2015-03-30 NOTE — ED Provider Notes (Signed)
CSN: [id number]     Arrival date & time 03/30/15  1402 History   First MD Initiated Contact with Patient 03/30/15 1546     Chief Complaint  Patient presents with  . Fall  . Back Pain     (Consider location/radiation/quality/duration/timing/severity/associated sxs/prior Treatment) HPI Comments: Patient presents complaining of back pain. Pt injured her back a week ago after a mechanical fall. No head injury. Pain is sharp and worse with movement  Symptoms have been present for 1 week. The symptoms are made worse by as above. The symptoms are made better by remaining still and using her pain meds. The patient has tried the following therapies: no heat or ice used.  The patient has seen the following providers for this complaint: seen here for a refill of her pain meds a weeks ago.Has chronic pain from bone CA. Denies h/o bowel/bladder dysfx, saddle anesthesia--some sob with deep breathing, but no chest pressure/pain    Patient is a 69 y.o. female presenting with fall and back pain. The history is provided by the patient.  Fall  Back Pain   Past Medical History  Diagnosis Date  . Cancer   . Hypertension   . Asthma   . Arthritis    Past Surgical History  Procedure Laterality Date  . Appendectomy    . Tubal ligation    . Cholecystectomy     No family history on file. History  Substance Use Topics  . Smoking status: Never Smoker   . Smokeless tobacco: Never Used  . Alcohol Use: No   OB History    No data available     Review of Systems  Musculoskeletal: Positive for back pain.  All other systems reviewed and are negative.     Allergies  Aspirin  Home Medications   Prior to Admission medications   Medication Sig Start Date End Date Taking? Authorizing Provider  albuterol (PROVENTIL HFA;VENTOLIN HFA) 108 (90 BASE) MCG/ACT inhaler Inhale 2 puffs into the lungs every 6 (six) hours as needed. For shortness of breath.    Historical Provider, MD  carisoprodol (SOMA)  350 MG tablet Take 175 mg by mouth 4 (four) times daily as needed for muscle spasms (muscle spasm). For muscle pain.    Historical Provider, MD  oxyCODONE (ROXICODONE) 15 MG immediate release tablet Take 15 mg by mouth every 4 (four) hours as needed for pain (pain).    Historical Provider, MD  oxyCODONE (ROXICODONE) 15 MG immediate release tablet Take 1 tablet (15 mg total) by mouth every 6 (six) hours as needed for pain. 03/23/15   Lajean Saver, MD  oxyCODONE-acetaminophen (PERCOCET) 10-325 MG per tablet Take 1 tablet by mouth every 6 (six) hours as needed for pain. For pain. Patient not taking: Reported on 11/16/2014 01/15/13   Curt Bears, MD  potassium chloride SA (K-DUR,KLOR-CON) 20 MEQ tablet Take 1 tablet (20 mEq total) by mouth daily. 02/08/15   Curt Bears, MD  potassium chloride SA (K-DUR,KLOR-CON) 20 MEQ tablet One (1) po bid x 3 days, then one (1) po once a day 03/23/15   Lajean Saver, MD   BP 164/92 mmHg  Pulse 86  Temp(Src) 98.5 F (36.9 C) (Oral)  Resp 18  SpO2 100% Physical Exam  Constitutional: She is oriented to person, place, and time. She appears well-developed and well-nourished.  Non-toxic appearance. No distress.  HENT:  Head: Normocephalic and atraumatic.  Eyes: Conjunctivae, EOM and lids are normal. Pupils are equal, round, and reactive to light.  Neck:  Normal range of motion. Neck supple. No tracheal deviation present. No thyroid mass present.  Cardiovascular: Normal rate, regular rhythm and normal heart sounds.  Exam reveals no gallop.   No murmur heard. Pulmonary/Chest: Effort normal and breath sounds normal. No stridor. No respiratory distress. She has no decreased breath sounds. She has no wheezes. She has no rhonchi. She has no rales.  Abdominal: Soft. Normal appearance and bowel sounds are normal. She exhibits no distension. There is no tenderness. There is no rebound and no CVA tenderness.  Musculoskeletal: Normal range of motion. She exhibits no edema or  tenderness.       Back:  Neurological: She is alert and oriented to person, place, and time. She has normal strength. No cranial nerve deficit or sensory deficit. GCS eye subscore is 4. GCS verbal subscore is 5. GCS motor subscore is 6.  Skin: Skin is warm and dry. No abrasion and no rash noted.  Psychiatric: She has a normal mood and affect. Her speech is normal and behavior is normal.  Nursing note and vitals reviewed.   ED Course  Procedures (including critical care time) Labs Review Labs Reviewed - No data to display  Imaging Review No results found.   EKG Interpretation None      MDM   Final diagnoses:  Trauma    Patient given 2 rounds of pain medication here feels better. Repeat neurological exam at time of discharge remained stable. Will refill patient's pain meds here and have informed her that she will need to follow-up with her physician for any additional medication refills    Lacretia Leigh, MD 03/30/15 1857

## 2015-03-30 NOTE — Progress Notes (Signed)
CSW met with pt at bedside. Patient confirms that she presents to [facility] due to back pain. Also, she confirms that she fell Thursday. Patient stated " Both of my legs gave out."  Patient states that she lives in [place] with her husband. Patient informed CSW that she can complete her ADL's independently. Patient states that she has fallen x2 in the past 6 months.  Patients states that she is not interested in a facility or home health at this time. Patient is determined that she will be able to manage her care at home.  Patient informed CSW that she has a hx of bone cancer.  Patient states that she does not have any questions.  Willette Brace [phone] ED CSW 03/30/2015 9:02 PM

## 2015-04-11 ENCOUNTER — Encounter (HOSPITAL_COMMUNITY): Payer: Self-pay

## 2015-04-11 ENCOUNTER — Emergency Department (HOSPITAL_COMMUNITY)
Admission: EM | Admit: 2015-04-11 | Discharge: 2015-04-11 | Disposition: A | Discharge status: Home / Self Care | Payer: Medicare Other | Attending: Emergency Medicine | Admitting: Emergency Medicine

## 2015-04-11 DIAGNOSIS — Y998 Other external cause status: Secondary | ICD-10-CM | POA: Diagnosis not present

## 2015-04-11 DIAGNOSIS — M1990 Unspecified osteoarthritis, unspecified site: Secondary | ICD-10-CM | POA: Insufficient documentation

## 2015-04-11 DIAGNOSIS — Z85830 Personal history of malignant neoplasm of bone: Secondary | ICD-10-CM | POA: Diagnosis not present

## 2015-04-11 DIAGNOSIS — M549 Dorsalgia, unspecified: Secondary | ICD-10-CM

## 2015-04-11 DIAGNOSIS — I10 Essential (primary) hypertension: Secondary | ICD-10-CM | POA: Diagnosis not present

## 2015-04-11 DIAGNOSIS — J45909 Unspecified asthma, uncomplicated: Secondary | ICD-10-CM | POA: Insufficient documentation

## 2015-04-11 DIAGNOSIS — R197 Diarrhea, unspecified: Secondary | ICD-10-CM | POA: Diagnosis not present

## 2015-04-11 DIAGNOSIS — F1123 Opioid dependence with withdrawal: Secondary | ICD-10-CM | POA: Insufficient documentation

## 2015-04-11 DIAGNOSIS — Y9389 Activity, other specified: Secondary | ICD-10-CM | POA: Diagnosis not present

## 2015-04-11 DIAGNOSIS — Y9289 Other specified places as the place of occurrence of the external cause: Secondary | ICD-10-CM | POA: Diagnosis not present

## 2015-04-11 DIAGNOSIS — S24109A Unspecified injury at unspecified level of thoracic spinal cord, initial encounter: Secondary | ICD-10-CM | POA: Insufficient documentation

## 2015-04-11 DIAGNOSIS — Z760 Encounter for issue of repeat prescription: Secondary | ICD-10-CM | POA: Diagnosis not present

## 2015-04-11 DIAGNOSIS — F1193 Opioid use, unspecified with withdrawal: Secondary | ICD-10-CM

## 2015-04-11 DIAGNOSIS — W1839XA Other fall on same level, initial encounter: Secondary | ICD-10-CM | POA: Insufficient documentation

## 2015-04-11 DIAGNOSIS — G8929 Other chronic pain: Secondary | ICD-10-CM

## 2015-04-11 LAB — COMPREHENSIVE METABOLIC PANEL
ALBUMIN: 4 g/dL (ref 3.5–5.0)
ALK PHOS: 113 U/L (ref 38–126)
ALT: 14 U/L (ref 14–54)
AST: 21 U/L (ref 15–41)
Anion gap: 7 (ref 5–15)
BUN: 15 mg/dL (ref 6–20)
CHLORIDE: 103 mmol/L (ref 101–111)
CO2: 28 mmol/L (ref 22–32)
CREATININE: 0.75 mg/dL (ref 0.44–1.00)
Calcium: 9 mg/dL (ref 8.9–10.3)
GFR calc non Af Amer: >60 mL/min (ref >60)
Glucose, Bld: 86 mg/dL (ref 65–99)
Potassium: 2.7 mmol/L — CL (ref 3.5–5.1)
Sodium: 138 mmol/L (ref 135–145)
TOTAL PROTEIN: 8.2 g/dL — AB (ref 6.5–8.1)
Total Bilirubin: 0.8 mg/dL (ref 0.3–1.2)

## 2015-04-11 LAB — URINALYSIS, ROUTINE W REFLEX MICROSCOPIC
Bilirubin Urine: NEGATIVE
GLUCOSE, UA: NEGATIVE mg/dL
HGB URINE DIPSTICK: NEGATIVE
KETONES UR: NEGATIVE mg/dL
Nitrite: NEGATIVE
PH: 6.5 (ref 5.0–8.0)
PROTEIN: NEGATIVE mg/dL
Specific Gravity, Urine: 1.016 (ref 1.005–1.030)
Urobilinogen, UA: 0.2 mg/dL (ref 0.0–1.0)

## 2015-04-11 LAB — CBC WITH DIFFERENTIAL/PLATELET
Basophils Absolute: 0 10*3/uL (ref 0.0–0.1)
Basophils Relative: 0 % (ref 0–1)
Eosinophils Absolute: 0.1 10*3/uL (ref 0.0–0.7)
Eosinophils Relative: 1 % (ref 0–5)
HCT: 39.2 % (ref 36.0–46.0)
Hemoglobin: 13.1 g/dL (ref 12.0–15.0)
LYMPHS PCT: 19 % (ref 12–46)
Lymphs Abs: 1.8 10*3/uL (ref 0.7–4.0)
MCH: 30 pg (ref 26.0–34.0)
MCHC: 33.4 g/dL (ref 30.0–36.0)
MCV: 89.7 fL (ref 78.0–100.0)
Monocytes Absolute: 0.5 10*3/uL (ref 0.1–1.0)
Monocytes Relative: 5 % (ref 3–12)
NEUTROS PCT: 75 % (ref 43–77)
Neutro Abs: 7.2 10*3/uL (ref 1.7–7.7)
PLATELETS: 164 10*3/uL (ref 150–400)
RBC: 4.37 MIL/uL (ref 3.87–5.11)
RDW: 13.2 % (ref 11.5–15.5)
WBC: 9.6 10*3/uL (ref 4.0–10.5)

## 2015-04-11 LAB — LIPASE, BLOOD: LIPASE: 25 U/L (ref 22–51)

## 2015-04-11 LAB — URINE MICROSCOPIC-ADD ON

## 2015-04-11 MED ORDER — OXYCODONE HCL 15 MG PO TABS: 15.0000 mg | ORAL_TABLET | Freq: Four times a day (QID) | ORAL | Status: DC | PRN

## 2015-04-11 MED ORDER — DIPHENOXYLATE-ATROPINE 2.5-0.025 MG PO TABS: 1.0000 | ORAL_TABLET | Freq: Four times a day (QID) | ORAL | Status: DC | PRN

## 2015-04-11 MED ORDER — POTASSIUM CHLORIDE CRYS ER 20 MEQ PO TBCR
60.0000 meq | EXTENDED_RELEASE_TABLET | Freq: Once | ORAL | Status: AC
  Administered 2015-04-11: 60 meq via ORAL
  Filled 2015-04-11: qty 3

## 2015-04-11 MED ORDER — HYDROMORPHONE HCL 2 MG/ML IJ SOLN
2.0000 mg | Freq: Once | INTRAMUSCULAR | Status: AC
  Administered 2015-04-11: 2 mg via INTRAMUSCULAR
  Filled 2015-04-11: qty 1

## 2015-04-11 NOTE — ED Notes (Signed)
This RN received a call from the lab.  Critical Potassium 2.7.

## 2015-04-11 NOTE — ED Notes (Signed)
Pt c/o mid and lower back pain since a fall 6/2 and diarrhea x 3 days.  Pain score 10/10.  Pt reports taking ibuprofen w/o relief.  Pt has been seen previously for back complaint.  Sts she has been trying to follow up w/ PCP, but "he's out of the [place] and I don't think he's coming back."

## 2015-04-11 NOTE — ED Notes (Signed)
Pt given sprite and crackers to eat with potassium pills per pt request. Pt made aware urine sample still needed.

## 2015-04-11 NOTE — ED Notes (Signed)
Pt escorted to discharge window. Pt verbalized understanding discharge instructions. In no acute distress.  

## 2015-04-11 NOTE — Discharge Instructions (Signed)

## 2015-04-13 NOTE — ED Provider Notes (Signed)
CSN: [id number]     Arrival date & time 04/11/15  1122 History   First MD Initiated Contact with Patient 04/11/15 1353     Chief Complaint  Patient presents with  . Diarrhea  . Fall  . Back Pain     (Consider location/radiation/quality/duration/timing/severity/associated sxs/prior Treatment) Patient is a 69 y.o. female presenting with back pain. The history is provided by the patient and a relative. No language interpreter was used.  Back Pain Location:  Thoracic spine Quality:  Aching Radiates to:  Does not radiate Pain severity:  Moderate Pain is:  Same all the time Onset quality:  Unable to specify Duration: chronically. Timing:  Constant Progression:  Unchanged Chronicity:  Chronic Context comment:  Hx of bone cancer Relieved by: oxycodone. Worsened by:  Nothing tried Ineffective treatments:  None tried Associated symptoms: no abdominal pain, no bladder incontinence, no bowel incontinence, no chest pain, no dysuria, no fever, no headaches, no leg pain, no numbness, no paresthesias, no pelvic pain, no perianal numbness, no tingling and no weakness   Associated symptoms comment:  Diarrhea, chills since running out of meds Risk factors: hx of cancer     Past Medical History  Diagnosis Date  . Cancer   . Hypertension   . Asthma   . Arthritis    Past Surgical History  Procedure Laterality Date  . Appendectomy    . Tubal ligation    . Cholecystectomy     History reviewed. No pertinent family history. History  Substance Use Topics  . Smoking status: Never Smoker   . Smokeless tobacco: Never Used  . Alcohol Use: No   OB History    No data available     Review of Systems  Constitutional: Negative for fever, chills, diaphoresis, activity change, appetite change and fatigue.  HENT: Negative for congestion, facial swelling, rhinorrhea and sore throat.   Eyes: Negative for photophobia and discharge.  Respiratory: Negative for cough, chest tightness and shortness of  breath.   Cardiovascular: Negative for chest pain, palpitations and leg swelling.  Gastrointestinal: Positive for diarrhea. Negative for nausea, vomiting, abdominal pain and bowel incontinence.  Endocrine: Negative for polydipsia and polyuria.  Genitourinary: Negative for bladder incontinence, dysuria, frequency, difficulty urinating and pelvic pain.  Musculoskeletal: Positive for back pain. Negative for arthralgias, neck pain and neck stiffness.  Skin: Negative for color change and wound.  Allergic/Immunologic: Negative for immunocompromised state.  Neurological: Negative for tingling, facial asymmetry, weakness, numbness, headaches and paresthesias.  Hematological: Does not bruise/bleed easily.  Psychiatric/Behavioral: Negative for confusion and agitation.      Allergies  Aspirin  Home Medications   Prior to Admission medications   Medication Sig Start Date End Date Taking? Authorizing Provider  ibuprofen (ADVIL,MOTRIN) 200 MG tablet Take 400 mg by mouth every 6 (six) hours as needed for moderate pain.   Yes Historical Provider, MD  diphenoxylate-atropine (LOMOTIL) 2.5-0.025 MG per tablet Take 1 tablet by mouth 4 (four) times daily as needed for diarrhea or loose stools. 04/11/15   Ernestina Patches, MD  oxyCODONE (ROXICODONE) 15 MG immediate release tablet Take 1 tablet (15 mg total) by mouth every 6 (six) hours as needed for pain. 04/11/15   Ernestina Patches, MD  oxyCODONE-acetaminophen (PERCOCET) 10-325 MG per tablet Take 1 tablet by mouth every 6 (six) hours as needed for pain. For pain. Patient not taking: Reported on 11/16/2014 01/15/13   Curt Bears, MD  potassium chloride SA (K-DUR,KLOR-CON) 20 MEQ tablet Take 1 tablet (20 mEq total) by  mouth daily. Patient not taking: Reported on 04/11/2015 02/08/15   Curt Bears, MD   BP 167/65 mmHg  Pulse 92  Temp(Src) 98.2 F (36.8 C) (Oral)  Resp 16  SpO2 99% Physical Exam  Constitutional: She is oriented to person, place, and time.  She appears well-developed and well-nourished. No distress.  HENT:  Head: Normocephalic and atraumatic.  Mouth/Throat: No oropharyngeal exudate.  Eyes: Pupils are equal, round, and reactive to light.  Neck: Normal range of motion. Neck supple.  Cardiovascular: Normal rate, regular rhythm and normal heart sounds.  Exam reveals no gallop and no friction rub.   No murmur heard. Pulmonary/Chest: Effort normal and breath sounds normal. No respiratory distress. She has no wheezes. She has no rales.  Abdominal: Soft. Bowel sounds are normal. She exhibits no distension and no mass. There is no tenderness. There is no rebound and no guarding.  Musculoskeletal: Normal range of motion. She exhibits no edema or tenderness.       Back:  Neurological: She is alert and oriented to person, place, and time. She has normal strength. She displays no atrophy and no tremor. No cranial nerve deficit or sensory deficit. She exhibits normal muscle tone. Coordination and gait normal. GCS eye subscore is 4. GCS verbal subscore is 5. GCS motor subscore is 6.  Skin: Skin is warm and dry.  Psychiatric: She has a normal mood and affect.    ED Course  Procedures (including critical care time) Labs Review Labs Reviewed  COMPREHENSIVE METABOLIC PANEL - Abnormal; Notable for the following:    Potassium 2.7 (*)    Total Protein 8.2 (*)    All other components within normal limits  URINALYSIS, ROUTINE W REFLEX MICROSCOPIC (NOT AT [facility]) - Abnormal; Notable for the following:    APPearance CLOUDY (*)    Leukocytes, UA SMALL (*)    All other components within normal limits  CBC WITH DIFFERENTIAL/PLATELET  LIPASE, BLOOD  URINE MICROSCOPIC-ADD ON    Imaging Review No results found.   EKG Interpretation None      MDM   Final diagnoses:  Opiate withdrawal  Chronic back pain    Pt is a 69 y.o. female with Pmhx as above who presents with request for refill of her 15mg  Roxicodone for back pain. She states she  had a fall about 2 weeks ago, had nml XRs, has continued ot have pain. Her prescriber has left the country and pt does not believe he will be coming back. Since being withouth the pain meds, she has developed d/a, chills.  Denies new falls or injuries, no bowel/bladder incontinence, saddle anesthesia, difficulty ambulating, or fevers. On PE, VSS, pt in NAD. +ttp over area of healing abrasion. No focal neuro findings. Pt given dose of pain meds in dept, given referral infor for new medical clinic and short course of refill. I do not feel she requires repeat imaging.      Darylene Price Janota evaluation in the Emergency Department is complete. It has been determined that no acute conditions requiring further emergency intervention are present at this time. The patient/guardian have been advised of the diagnosis and plan. We have discussed signs and symptoms that warrant return to the ED, such as changes or worsening in symptoms, worsening pain, numbness, weakness, bowel/bladder incontinence, fevers.       Ernestina Patches, MD 04/13/15 [phone]

## 2015-04-21 ENCOUNTER — Emergency Department (HOSPITAL_COMMUNITY)
Admission: EM | Admit: 2015-04-21 | Discharge: 2015-04-21 | Disposition: A | Discharge status: Home / Self Care | Payer: Medicare Other | Attending: Emergency Medicine | Admitting: Emergency Medicine

## 2015-04-21 ENCOUNTER — Encounter (HOSPITAL_COMMUNITY): Payer: Self-pay | Admitting: Emergency Medicine

## 2015-04-21 DIAGNOSIS — I10 Essential (primary) hypertension: Secondary | ICD-10-CM | POA: Diagnosis not present

## 2015-04-21 DIAGNOSIS — M545 Low back pain, unspecified: Secondary | ICD-10-CM

## 2015-04-21 DIAGNOSIS — Z859 Personal history of malignant neoplasm, unspecified: Secondary | ICD-10-CM | POA: Diagnosis not present

## 2015-04-21 DIAGNOSIS — G8929 Other chronic pain: Secondary | ICD-10-CM | POA: Insufficient documentation

## 2015-04-21 DIAGNOSIS — J45909 Unspecified asthma, uncomplicated: Secondary | ICD-10-CM | POA: Insufficient documentation

## 2015-04-21 DIAGNOSIS — Z79899 Other long term (current) drug therapy: Secondary | ICD-10-CM | POA: Insufficient documentation

## 2015-04-21 DIAGNOSIS — M1990 Unspecified osteoarthritis, unspecified site: Secondary | ICD-10-CM | POA: Insufficient documentation

## 2015-04-21 LAB — BASIC METABOLIC PANEL
Anion gap: 10 (ref 5–15)
BUN: 15 mg/dL (ref 6–20)
CO2: 27 mmol/L (ref 22–32)
Calcium: 9.2 mg/dL (ref 8.9–10.3)
Chloride: 104 mmol/L (ref 101–111)
Creatinine, Ser: 0.82 mg/dL (ref 0.44–1.00)
GFR calc non Af Amer: >60 mL/min (ref >60)
GLUCOSE: 93 mg/dL (ref 65–99)
Potassium: 3.5 mmol/L (ref 3.5–5.1)
Sodium: 141 mmol/L (ref 135–145)

## 2015-04-21 LAB — MAGNESIUM: MAGNESIUM: 2.1 mg/dL (ref 1.7–2.4)

## 2015-04-21 MED ORDER — OXYCODONE HCL 5 MG PO TABS
15.0000 mg | ORAL_TABLET | Freq: Once | ORAL | Status: AC
  Administered 2015-04-21: 15 mg via ORAL
  Filled 2015-04-21: qty 3

## 2015-04-21 MED ORDER — OXYCODONE HCL 15 MG PO TABS: 15.0000 mg | ORAL_TABLET | Freq: Four times a day (QID) | ORAL | Status: DC | PRN

## 2015-04-21 NOTE — Progress Notes (Signed)
ED CM consulted by ED RN at pt d/c stating that pt had questions about her medicare, doctors Spoke with pt who states she has medicare but not medicare part D She states she has been to DSS and SSI to attempt to get Part D added but has 3 letter stating denial related to her spouse in come "fourteen 72 a month" per pt Pt states she was informed by DSS/SSI staff that her husband should be able to help her with the cost of her medications Pt states husband refuses to do so and tells her to "give him one hundred dollars a month out of my five hundred I get." "like now when I go get my medicine today he will pay for it but will want me to pay him back on the day I get my check" "We did not even have food last week until my son came by to buy some" " my son works for an [employer] (life [employer])   CM referred pt to DSS/SSI, pcp, [facility] and cancer [facility] SW & financial counselor after she states her oncology medications are expensive. Pt states she did not realize she could have a oncology SW or financial counselor to assist her Reports her pharmacist at [facility] pharmacist assisted her to get one of her oncology meds from over $100 to $36.  Pt states she does not want to change pharmacies.  She denies having supportive family and friends of her own. Her son "has his own life" States she "raised a girl but I would not call her and the last time I did she cried about her problems" CM offered pt goodrx, [place] med assist and had her to have pcp assist with these Pt reports pcp. Amadeo Garnet has been out of town for "two weeks" and she was looking for another Cm offered her a 6 page list of medicare providers within zip [phone] to assist with possibly obtaining an alternative pcp while pcp out of town.  Gave list of contact info for health dept, dss, and ssi office.

## 2015-04-21 NOTE — ED Provider Notes (Signed)
CSN: [id number]     Arrival date & time 04/21/15  1220 History   First MD Initiated Contact with Patient 04/21/15 1254     Chief Complaint  Patient presents with  . Back Pain     (Consider location/radiation/quality/duration/timing/severity/associated sxs/prior Treatment) Patient is a 69 y.o. female presenting with back pain. The history is provided by the patient.  Back Pain Associated symptoms: no abdominal pain, no chest pain, no fever, no headaches, no numbness and no weakness   Patient w hx multiple myeloma and chronic low back pain, presents c/o low back pain for the past several weeks.  States when she has her normal prescription medication that her pain is relatively well controlled, and that she is currently out of her medication.  States has tried ibuprofen without relief.  Pt denies fall/injury since her ED visit last month, during which time she had xrays done. Denies any leg or saddle area numbness. No leg weakness or loss of normal functional ability. No fever or chills.  States has tried to get appt w hem/onc but could not get appt until next month.      Past Medical History  Diagnosis Date  . Cancer   . Hypertension   . Asthma   . Arthritis    Past Surgical History  Procedure Laterality Date  . Appendectomy    . Tubal ligation    . Cholecystectomy     History reviewed. No pertinent family history. History  Substance Use Topics  . Smoking status: Never Smoker   . Smokeless tobacco: Never Used  . Alcohol Use: No   OB History    No data available     Review of Systems  Constitutional: Negative for fever and chills.  HENT: Negative for sore throat.   Eyes: Negative for redness.  Respiratory: Negative for cough and shortness of breath.   Cardiovascular: Negative for chest pain.  Gastrointestinal: Negative for vomiting and abdominal pain.  Endocrine: Negative for polyuria.  Genitourinary: Negative for flank pain.       No urinary retention, or  incontinence.   Musculoskeletal: Positive for back pain. Negative for neck pain.  Skin: Negative for rash.  Neurological: Negative for weakness, numbness and headaches.  Hematological: Does not bruise/bleed easily.  Psychiatric/Behavioral: Negative for confusion.      Allergies  Aspirin  Home Medications   Prior to Admission medications   Medication Sig Start Date End Date Taking? Authorizing Provider  ibuprofen (ADVIL,MOTRIN) 200 MG tablet Take 400 mg by mouth every 6 (six) hours as needed for moderate pain.   Yes Historical Provider, MD  diphenoxylate-atropine (LOMOTIL) 2.5-0.025 MG per tablet Take 1 tablet by mouth 4 (four) times daily as needed for diarrhea or loose stools. Patient not taking: Reported on 04/21/2015 04/11/15   Ernestina Patches, MD  oxyCODONE (ROXICODONE) 15 MG immediate release tablet Take 1 tablet (15 mg total) by mouth every 6 (six) hours as needed for pain. Patient not taking: Reported on 04/21/2015 04/11/15   Ernestina Patches, MD  oxyCODONE-acetaminophen (PERCOCET) 10-325 MG per tablet Take 1 tablet by mouth every 6 (six) hours as needed for pain. For pain. Patient not taking: Reported on 11/16/2014 01/15/13   Curt Bears, MD  potassium chloride SA (K-DUR,KLOR-CON) 20 MEQ tablet Take 1 tablet (20 mEq total) by mouth daily. Patient not taking: Reported on 04/11/2015 02/08/15   Curt Bears, MD   BP 176/102 mmHg  Pulse 95  Temp(Src) 99 F (37.2 C) (Oral)  Resp 18  SpO2  100% Physical Exam  Constitutional: She is oriented to person, place, and time. She appears well-developed and well-nourished. No distress.  HENT:  Head: Atraumatic.  Mouth/Throat: Oropharynx is clear and moist.  Eyes: Conjunctivae are normal. No scleral icterus.  Neck: Neck supple. No tracheal deviation present.  Cardiovascular: Normal rate, regular rhythm, normal heart sounds and intact distal pulses.   Pulmonary/Chest: Effort normal and breath sounds normal. No respiratory distress.   Abdominal: Soft. Normal appearance and bowel sounds are normal. She exhibits no distension. There is no tenderness.  Genitourinary:  No cva tenderness  Musculoskeletal: She exhibits no edema.  TLS spine non tender, aligned, no step off. No mass. +lumbar muscular tenderness.   Neurological: She is alert and oriented to person, place, and time.  Straight leg raise neg. Motor intact bil LE 5/5. sens grossly intact.   Skin: Skin is warm and dry. No rash noted. She is not diaphoretic.  Psychiatric: She has a normal mood and affect.  Nursing note and vitals reviewed.   ED Course  Procedures (including critical care time) Labs Review  Results for orders placed or performed during the hospital encounter of 34/28/76  Basic metabolic panel  Result Value Ref Range   Sodium 141 135 - 145 mmol/L   Potassium 3.5 3.5 - 5.1 mmol/L   Chloride 104 101 - 111 mmol/L   CO2 27 22 - 32 mmol/L   Glucose, Bld 93 65 - 99 mg/dL   BUN 15 6 - 20 mg/dL   Creatinine, Ser 0.82 0.44 - 1.00 mg/dL   Calcium 9.2 8.9 - 10.3 mg/dL   GFR calc non Af Amer >60 >60 mL/min   GFR calc Af Amer >60 >60 mL/min   Anion gap 10 5 - 15  Magnesium  Result Value Ref Range   Magnesium 2.1 1.7 - 2.4 mg/dL   Dg Ribs Unilateral W/chest Left  03/30/2015   CLINICAL DATA:  Tripped and fell onto coffee table 6 days ago.  EXAM: LEFT RIBS AND CHEST - 3+ VIEW  COMPARISON:  CT 09/18/2014  FINDINGS: Cardiac silhouette is enlarged. There is a right suprahilar density which corresponds to an ectatic vessel on comparison CT. There is a large substernal goiter on comparison CT.  No pleural effusion or pulmonary contusion. No pneumothorax. Dedicated views of the right ribs demonstrate no displaced rib fracture. Degenerative changes noted in the shoulders and spine and pelvis.  IMPRESSION: 1. No evidence thoracic trauma. 2. Stable cardiomegaly. 3. Ectatic mediastinal vessels and thyroid goiter.   Electronically Signed   By: Suzy Bouchard M.D.   On:  03/30/2015 17:24   Dg Lumbar Spine Complete  03/30/2015   CLINICAL DATA:  Fall 6 days ago  EXAM: LUMBAR SPINE - COMPLETE 4+ VIEW  COMPARISON:  02/19/2011  FINDINGS: There is anatomic alignment of the vertebral bodies. There is no vertebral compression deformity. Mild narrowing of the L1-2 and L2-3 discs. Moderate narrowing of the L5-S1 disc. Osteopenia. Calcified fibroid projects over the sacrum. Facet arthropathy at L4-5 and L5-S1 is noted. Degenerative changes in the SI and hip joints are also present.  IMPRESSION: No acute bony pathology.  Chronic changes.   Electronically Signed   By: Marybelle Killings M.D.   On: 03/30/2015 17:19       MDM   Reviewed nursing notes and prior charts for additional history.   Pt requests pain med, states out of her normal med, oxycodone 15 mg.  Pt given single dose in ED.  Discussed w  patient importance of pcp f/u re refill meds/chronic pain.  Pt voices understanding, and states has tried to get in, but could not get appointment until next month.  k normal.  Recheck pain improved. Pt tolerating po fluids well.   No new c/o. Pt indicates she feels ready for d/c.  Return precautions.   Reiterated need for med/pain med refill via pcp or heme/onc.  Also discussed importance pcp f/u re bp w pt.      Lajean Saver, MD 04/21/15 [phone]

## 2015-04-21 NOTE — ED Notes (Signed)
Pt states she has been having lower and mid back pain for several years since being diagnosed with bone cancer. Pt with fall on June 2nd and states she was given pain meds that she is now out of. Pt was given Oxycodone hcl 15mg . Pt c/o SOB and rib pain.

## 2015-04-21 NOTE — Discharge Instructions (Signed)
It was our pleasure to provide your ER care today - we hope that you feel better.  Take oxycodone as need for pain - no driving when taking.  Follow up with primary care doctor in the coming week - see referral - call to arrange appointment.  Also have your blood pressure rechecked then, as it is high today.   Follow up with your heme/onc specialist as planned.  YOU MUST GET FUTURE PAIN MEDICATION REFILLS BY EITHER YOUR PRIMARY CARE DOCTOR, OR YOUR SPECIALIST.  THE ER WILL UNABLE TO BE ABLE TO CONTINUE TO REFILL YOUR PAIN MEDICATION.   Return if worse, new symptoms, fevers, intractable pain, numbness/weakness, other concern.  You were given pain medication in the ER - no driving for the next 4 hours.      Back Pain, Adult Low back pain is very common. About 1 in 5 people have back pain.The cause of low back pain is rarely dangerous. The pain often gets better over time.About half of people with a sudden onset of back pain feel better in just 2 weeks. About 8 in 10 people feel better by 6 weeks.  CAUSES Some common causes of back pain include:  Strain of the muscles or ligaments supporting the spine.  Wear and tear (degeneration) of the spinal discs.  Arthritis.  Direct injury to the back. DIAGNOSIS Most of the time, the direct cause of low back pain is not known.However, back pain can be treated effectively even when the exact cause of the pain is unknown.Answering your caregiver's questions about your overall health and symptoms is one of the most accurate ways to make sure the cause of your pain is not dangerous. If your caregiver needs more information, he or she may order lab work or imaging tests (X-rays or MRIs).However, even if imaging tests show changes in your back, this usually does not require surgery. HOME CARE INSTRUCTIONS For many people, back pain returns.Since low back pain is rarely dangerous, it is often a condition that people can learn to [facility] their  own.   Remain active. It is stressful on the back to sit or stand in one place. Do not sit, drive, or stand in one place for more than 30 minutes at a time. Take short walks on level surfaces as soon as pain allows.Try to increase the length of time you walk each day.  Do not stay in bed.Resting more than 1 or 2 days can delay your recovery.  Do not avoid exercise or work.Your body is made to move.It is not dangerous to be active, even though your back may hurt.Your back will likely heal faster if you return to being active before your pain is gone.  Pay attention to your body when you bend and lift. Many people have less discomfortwhen lifting if they bend their knees, keep the load close to their bodies,and avoid twisting. Often, the most comfortable positions are those that put less stress on your recovering back.  Find a comfortable position to sleep. Use a firm mattress and lie on your side with your knees slightly bent. If you lie on your back, put a pillow under your knees.  Only take over-the-counter or prescription medicines as directed by your caregiver. Over-the-counter medicines to reduce pain and inflammation are often the most helpful.Your caregiver may prescribe muscle relaxant drugs.These medicines help dull your pain so you can more quickly return to your normal activities and healthy exercise.  Put ice on the injured area.  Put  ice in a plastic bag.  Place a towel between your skin and the bag.  Leave the ice on for 15-20 minutes, 03-04 times a day for the first 2 to 3 days. After that, ice and heat may be alternated to reduce pain and spasms.  Ask your caregiver about trying back exercises and gentle massage. This may be of some benefit.  Avoid feeling anxious or stressed.Stress increases muscle tension and can worsen back pain.It is important to recognize when you are anxious or stressed and learn ways to manage it.Exercise is a great option. SEEK MEDICAL  CARE IF:  You have pain that is not relieved with rest or medicine.  You have pain that does not improve in 1 week.  You have new symptoms.  You are generally not feeling well. SEEK IMMEDIATE MEDICAL CARE IF:   You have pain that radiates from your back into your legs.  You develop new bowel or bladder control problems.  You have unusual weakness or numbness in your arms or legs.  You develop nausea or vomiting.  You develop abdominal pain.  You feel faint. Document Released: 10/08/2005 Document Revised: 04/08/2012 Document Reviewed: 02/09/2014 [facility] Patient Information 2015 [place], [place]. This information is not intended to replace advice given to you by your health care provider. Make sure you discuss any questions you have with your health care provider.    Chronic Back Pain  When back pain lasts longer than 3 months, it is called chronic back pain.People with chronic back pain often go through certain periods that are more intense (flare-ups).  CAUSES Chronic back pain can be caused by wear and tear (degeneration) on different structures in your back. These structures include:  The bones of your spine (vertebrae) and the joints surrounding your spinal cord and nerve roots (facets).  The strong, fibrous tissues that connect your vertebrae (ligaments). Degeneration of these structures may result in pressure on your nerves. This can lead to constant pain. HOME CARE INSTRUCTIONS  Avoid bending, heavy lifting, prolonged sitting, and activities which make the problem worse.  Take brief periods of rest throughout the day to reduce your pain. Lying down or standing usually is better than sitting while you are resting.  Take over-the-counter or prescription medicines only as directed by your caregiver. SEEK IMMEDIATE MEDICAL CARE IF:   You have weakness or numbness in one of your legs or feet.  You have trouble controlling your bladder or bowels.  You have nausea,  vomiting, abdominal pain, shortness of breath, or fainting. Document Released: 11/15/2004 Document Revised: 12/31/2011 Document Reviewed: 09/22/2011 [facility] Patient Information 2015 [place], [place]. This information is not intended to replace advice given to you by your health care provider. Make sure you discuss any questions you have with your health care provider.    Hypertension Hypertension, commonly called high blood pressure, is when the force of blood pumping through your arteries is too strong. Your arteries are the blood vessels that carry blood from your heart throughout your body. A blood pressure reading consists of a higher number over a lower number, such as 110/72. The higher number (systolic) is the pressure inside your arteries when your heart pumps. The lower number (diastolic) is the pressure inside your arteries when your heart relaxes. Ideally you want your blood pressure below 120/80. Hypertension forces your heart to work harder to pump blood. Your arteries may become narrow or stiff. Having hypertension puts you at risk for heart disease, stroke, and other problems.  RISK  FACTORS Some risk factors for high blood pressure are controllable. Others are not.  Risk factors you cannot control include:   Race. You may be at higher risk if you are African American.  Age. Risk increases with age.  Gender. Men are at higher risk than women before age 92 years. After age 36, women are at higher risk than men. Risk factors you can control include:  Not getting enough exercise or physical activity.  Being overweight.  Getting too much fat, sugar, calories, or salt in your diet.  Drinking too much alcohol. SIGNS AND SYMPTOMS Hypertension does not usually cause signs or symptoms. Extremely high blood pressure (hypertensive crisis) may cause headache, anxiety, shortness of breath, and nosebleed. DIAGNOSIS  To check if you have hypertension, your health care provider will  measure your blood pressure while you are seated, with your arm held at the level of your heart. It should be measured at least twice using the same arm. Certain conditions can cause a difference in blood pressure between your right and left arms. A blood pressure reading that is higher than normal on one occasion does not mean that you need treatment. If one blood pressure reading is high, ask your health care provider about having it checked again. TREATMENT  Treating high blood pressure includes making lifestyle changes and possibly taking medicine. Living a healthy lifestyle can help lower high blood pressure. You may need to change some of your habits. Lifestyle changes may include:  Following the DASH diet. This diet is high in fruits, vegetables, and whole grains. It is low in salt, red meat, and added sugars.  Getting at least 2 hours of brisk physical activity every week.  Losing weight if necessary.  Not smoking.  Limiting alcoholic beverages.  Learning ways to reduce stress. If lifestyle changes are not enough to get your blood pressure under control, your health care provider may prescribe medicine. You may need to take more than one. Work closely with your health care provider to understand the risks and benefits. HOME CARE INSTRUCTIONS  Have your blood pressure rechecked as directed by your health care provider.   Take medicines only as directed by your health care provider. Follow the directions carefully. Blood pressure medicines must be taken as prescribed. The medicine does not work as well when you skip doses. Skipping doses also puts you at risk for problems.   Do not smoke.   Monitor your blood pressure at home as directed by your health care provider. SEEK MEDICAL CARE IF:   You think you are having a reaction to medicines taken.  You have recurrent headaches or feel dizzy.  You have swelling in your ankles.  You have trouble with your vision. SEEK  IMMEDIATE MEDICAL CARE IF:  You develop a severe headache or confusion.  You have unusual weakness, numbness, or feel faint.  You have severe chest or abdominal pain.  You vomit repeatedly.  You have trouble breathing. MAKE SURE YOU:   Understand these instructions.  Will watch your condition.  Will get help right away if you are not doing well or get worse. Document Released: 10/08/2005 Document Revised: 02/22/2014 Document Reviewed: 07/31/2013 [facility] Patient Information 2015 [place], [place]. This information is not intended to replace advice given to you by your health care provider. Make sure you discuss any questions you have with your health care provider.   Multiple Myeloma Multiple myeloma is the most common cancer of bone. It is caused by the uncontrolled multiplication  of a type of white blood cell in the marrow. This white blood cell is called a plasma cell. This means the bone marrow is overworking producing plasma cells. Soon these overproduced cells begin to take up room in the marrow that is needed by other cells. This means that there are soon not enough red or white blood cells or platelets. Not enough red cells mean that the person is anemic. There are not enough red blood cells to carry oxygen around the body. There are not enough white blood cells to fight disease. This causes the person with multiple myeloma to not feel well. There is also bone pain through much of the body. SYMPTOMS  Anemia causes fatigue (tiredness) and weakness.  Back pain is common. This is from fractures (break in bones) caused by damage to the bones of the back.  Lack of white blood cells makes infection more likely.  Bleeding is a common problem from lack of the cells (platelets). Platelets help blood clots form. This may show up as bleeding from any place. Commonly this shows up as bleeding from the nose or gums.  Fractures (bone breaks) are more common anywhere. The back and ribs are  the most commonly fractured areas. DIAGNOSIS  This tumor is often suggested by blood tests. Often doing a bone marrow sample makes the diagnosis (learning what is wrong). This is a test performed by taking a small sample of bone with a small needle. This bone often comes from the sternum (breast bone). This sample is sent to a pathologist (a specialist in looking at tissue under a microscope). After looking at the sample under the microscope, the pathologist is able to make a diagnosis of the problem. X-rays may also show boney changes. TREATMENT   Occasionally, anti-cancer medications may be used with multiple myeloma. Your caregiver can discuss this with you.  Medications can also be given to help with the bone pain.  There is no cure for multiple myeloma. Lifestyle changes can add years of quality living. HOME CARE INSTRUCTIONS  Often there is no specific treatment for multiple myeloma. Most of the treatment consists of adjustments in dietary and living activities. Some of these changes include:  Your dietitian or caregiver helping you with your dietary questions.  Taking iron and vitamins as prescribed by your caregiver.  Eating a well balanced diet.  Staying active, but follow restrictions suggested by your caregiver. Avoiding heavy lifting (more than 10 pounds) and activities that cause increased pain.  Drinking plenty of water.  Using back braces and a cane may help with some of the boney pain. SEEK IMMEDIATE MEDICAL CARE IF:  You develop severe, uncontrolled boney pain.  You or your family notices confusion, problems with decision-making or inability to stay awake.  You notice increased urination or constipation.  You notice problems holding your water or stool.  You have numbness or loss of control of your extremities (arms/hands or legs/feet). Document Released: 07/03/2001 Document Revised: 12/31/2011 Document Reviewed: 10/03/2008 [facility] Patient Information 2015  [place], [place]. This information is not intended to replace advice given to you by your health care provider. Make sure you discuss any questions you have with your health care provider.

## 2015-05-06 ENCOUNTER — Emergency Department (HOSPITAL_COMMUNITY)
Admission: EM | Admit: 2015-05-06 | Discharge: 2015-05-06 | Disposition: A | Discharge status: Home / Self Care | Payer: Medicare Other | Attending: Emergency Medicine | Admitting: Emergency Medicine

## 2015-05-06 ENCOUNTER — Encounter (HOSPITAL_COMMUNITY): Payer: Self-pay

## 2015-05-06 DIAGNOSIS — E876 Hypokalemia: Secondary | ICD-10-CM | POA: Insufficient documentation

## 2015-05-06 DIAGNOSIS — C9001 Multiple myeloma in remission: Secondary | ICD-10-CM | POA: Diagnosis not present

## 2015-05-06 DIAGNOSIS — J45909 Unspecified asthma, uncomplicated: Secondary | ICD-10-CM | POA: Insufficient documentation

## 2015-05-06 DIAGNOSIS — M1990 Unspecified osteoarthritis, unspecified site: Secondary | ICD-10-CM | POA: Diagnosis not present

## 2015-05-06 DIAGNOSIS — M545 Low back pain: Secondary | ICD-10-CM | POA: Insufficient documentation

## 2015-05-06 DIAGNOSIS — D849 Immunodeficiency, unspecified: Secondary | ICD-10-CM | POA: Diagnosis not present

## 2015-05-06 DIAGNOSIS — G8929 Other chronic pain: Secondary | ICD-10-CM | POA: Diagnosis not present

## 2015-05-06 DIAGNOSIS — I10 Essential (primary) hypertension: Secondary | ICD-10-CM | POA: Insufficient documentation

## 2015-05-06 DIAGNOSIS — M549 Dorsalgia, unspecified: Secondary | ICD-10-CM

## 2015-05-06 LAB — I-STAT CHEM 8, ED
BUN: 12 mg/dL (ref 6–20)
CHLORIDE: 101 mmol/L (ref 101–111)
Calcium, Ion: 1.19 mmol/L (ref 1.13–1.30)
Creatinine, Ser: 0.7 mg/dL (ref 0.44–1.00)
Glucose, Bld: 86 mg/dL (ref 65–99)
HCT: 42 % (ref 36.0–46.0)
Hemoglobin: 14.3 g/dL (ref 12.0–15.0)
POTASSIUM: 3.2 mmol/L — AB (ref 3.5–5.1)
Sodium: 142 mmol/L (ref 135–145)
TCO2: 27 mmol/L (ref 0–100)

## 2015-05-06 MED ORDER — POTASSIUM CHLORIDE CRYS ER 20 MEQ PO TBCR
40.0000 meq | EXTENDED_RELEASE_TABLET | Freq: Once | ORAL | Status: AC
  Administered 2015-05-06: 40 meq via ORAL
  Filled 2015-05-06: qty 2

## 2015-05-06 MED ORDER — POTASSIUM CHLORIDE CRYS ER 20 MEQ PO TBCR: 20.0000 meq | EXTENDED_RELEASE_TABLET | Freq: Every day | ORAL | Status: DC

## 2015-05-06 MED ORDER — OXYCODONE-ACETAMINOPHEN 5-325 MG PO TABS
1.0000 | ORAL_TABLET | Freq: Once | ORAL | Status: AC
  Administered 2015-05-06: 1 via ORAL
  Filled 2015-05-06: qty 1

## 2015-05-06 MED ORDER — OXYCODONE-ACETAMINOPHEN 10-325 MG PO TABS: 1.0000 | ORAL_TABLET | Freq: Three times a day (TID) | ORAL | Status: DC | PRN

## 2015-05-06 MED ORDER — OXYCODONE-ACETAMINOPHEN 5-325 MG PO TABS
1.0000 | ORAL_TABLET | Freq: Once | ORAL | Status: AC
Start: 2015-05-06 — End: 2015-05-06
  Administered 2015-05-06: 1 via ORAL
  Filled 2015-05-06: qty 1

## 2015-05-06 MED ORDER — HYDROCHLOROTHIAZIDE 25 MG PO TABS: 25.0000 mg | ORAL_TABLET | Freq: Every day | ORAL | Status: DC

## 2015-05-06 NOTE — ED Notes (Signed)
Pt presents with c/o chronic back pain. Pt reports she has bone cancer and her back always hurts from this. Pt reports she needs some pain medicine until she is able to see her doctor. Pt reports that she has chemo every three months and her next treatment is coming up soon, last one approx 3 months ago.

## 2015-05-06 NOTE — ED Notes (Signed)
Notified EDP,Jacubowitz,MD., pt. i-stat Chem 8 results potassium 3.2.

## 2015-05-06 NOTE — ED Notes (Signed)
MD at bedside. Dr. Winfred Leeds at bedside.

## 2015-05-06 NOTE — ED Notes (Signed)
Bed: WA21 Expected date:  Expected time:  Means of arrival:  Comments: Hold for triage

## 2015-05-06 NOTE — Discharge Instructions (Signed)
Call the [facility] on Monday, 05/09/2015 to schedule the next available appointment. Your blood pressure should be rechecked within the next one or 2 weeks. Today's was elevated at 160/110

## 2015-05-06 NOTE — ED Provider Notes (Signed)
CSN: [id number]     Arrival date & time 05/06/15  1719 History   First MD Initiated Contact with Patient 05/06/15 1955     Chief Complaint  Patient presents with  . Back Pain     (Consider location/radiation/quality/duration/timing/severity/associated sxs/prior Treatment) HPI Patient complains of low back pain nonradiating for the past 8 years. Pain became worse since 05/01/2015 when she ran out of her pain medicine. No loss of bladder or bowel control. No fever. No trauma. No other associated symptoms. Past Medical History  Diagnosis Date  . Cancer   . Hypertension   . Asthma   . Arthritis    Past Surgical History  Procedure Laterality Date  . Appendectomy    . Tubal ligation    . Cholecystectomy     No family history on file. History  Substance Use Topics  . Smoking status: Never Smoker   . Smokeless tobacco: Never Used  . Alcohol Use: No   OB History    No data available     Review of Systems  Constitutional: Negative.   HENT: Negative.   Respiratory: Negative.   Cardiovascular: Negative.   Gastrointestinal: Negative.   Musculoskeletal: Positive for back pain and gait problem.       Walks with cane  Skin: Negative.   Allergic/Immunologic: Positive for immunocompromised state.       Currently treated for multiple myeloma  Neurological: Negative.   Psychiatric/Behavioral: Negative.   All other systems reviewed and are negative.     Allergies  Aspirin  Home Medications   Prior to Admission medications   Medication Sig Start Date End Date Taking? Authorizing Provider  ibuprofen (ADVIL,MOTRIN) 200 MG tablet Take 400 mg by mouth every 6 (six) hours as needed for moderate pain.   Yes Historical Provider, MD  naproxen sodium (ANAPROX) 220 MG tablet Take 440 mg by mouth 2 (two) times daily as needed (pain).   Yes Historical Provider, MD  oxyCODONE (ROXICODONE) 15 MG immediate release tablet Take 1 tablet (15 mg total) by mouth every 6 (six) hours as needed for  severe pain. 04/21/15  Yes Lajean Saver, MD  diphenoxylate-atropine (LOMOTIL) 2.5-0.025 MG per tablet Take 1 tablet by mouth 4 (four) times daily as needed for diarrhea or loose stools. Patient not taking: Reported on 04/21/2015 04/11/15   Ernestina Patches, MD  oxyCODONE (ROXICODONE) 15 MG immediate release tablet Take 1 tablet (15 mg total) by mouth every 6 (six) hours as needed for pain. Patient not taking: Reported on 04/21/2015 04/11/15   Ernestina Patches, MD  oxyCODONE-acetaminophen (PERCOCET) 10-325 MG per tablet Take 1 tablet by mouth every 6 (six) hours as needed for pain. For pain. Patient not taking: Reported on 11/16/2014 01/15/13   Curt Bears, MD  potassium chloride SA (K-DUR,KLOR-CON) 20 MEQ tablet Take 1 tablet (20 mEq total) by mouth daily. Patient not taking: Reported on 04/11/2015 02/08/15   Curt Bears, MD   BP 186/114 mmHg  Pulse 80  Temp(Src) 98.6 F (37 C) (Oral)  Resp 18  SpO2 99% Physical Exam  Constitutional: She appears well-developed and well-nourished. No distress.  HENT:  Head: Normocephalic and atraumatic.  Eyes: Conjunctivae are normal. Pupils are equal, round, and reactive to light.  Neck: Neck supple. No tracheal deviation present. No thyromegaly present.  Cardiovascular: Normal rate and regular rhythm.   No murmur heard. Pulmonary/Chest: Effort normal and breath sounds normal.  Abdominal: Soft. Bowel sounds are normal. She exhibits no distension. There is no tenderness.  Musculoskeletal: Normal  range of motion. She exhibits no edema or tenderness.  Left-sided paralumbar tenderness  Neurological: She is alert. She has normal reflexes. No cranial nerve deficit. Coordination normal.  Walks with cane without assistance  Skin: Skin is warm and dry. No rash noted.  Psychiatric: She has a normal mood and affect.  Nursing note and vitals reviewed.   ED Course  Procedures (including critical care time) Labs Review Labs Reviewed - No data to display  Imaging  Review No results found.   EKG Interpretation None     9:50 PM pain is improved after treatment with Percocet. She is requesting an additional Percocet which has been ordered by me prior to discharge. Results for orders placed or performed during the hospital encounter of 05/06/15  I-Stat Chem 8, ED  Result Value Ref Range   Sodium 142 135 - 145 mmol/L   Potassium 3.2 (L) 3.5 - 5.1 mmol/L   Chloride 101 101 - 111 mmol/L   BUN 12 6 - 20 mg/dL   Creatinine, Ser 0.70 0.44 - 1.00 mg/dL   Glucose, Bld 86 65 - 99 mg/dL   Calcium, Ion 1.19 1.13 - 1.30 mmol/L   TCO2 27 0 - 100 mmol/L   Hemoglobin 14.3 12.0 - 15.0 g/dL   HCT 42.0 36.0 - 46.0 %   No results found.  MDM  Patient has no primary care physician. She has hypertension, untreated. She also suffers from long-standing hypokalemia. Final diagnoses:  None   emergent imaging not indicated. Suffers from chronic long-standing pain no signs of cauda equina syndrome   plan prescription Percocet, HCTZ, potassium chloride. Referral [facility] to get primary care physician blood pressure recheck one or 2 weeks Diagnosis #1 chronic back pain #2 hypertension #3 hypokalemia    Orlie Dakin, MD 05/06/15 2156

## 2015-05-09 ENCOUNTER — Telehealth: Payer: Self-pay | Admitting: Internal Medicine

## 2015-05-09 NOTE — Telephone Encounter (Signed)
returned call and lvm for pt confirming 7.26 appt per pt request

## 2015-05-10 ENCOUNTER — Other Ambulatory Visit: Payer: Medicare Other

## 2015-05-13 ENCOUNTER — Encounter: Payer: Self-pay | Admitting: Family Medicine

## 2015-05-13 ENCOUNTER — Ambulatory Visit: Payer: Medicare Other | Attending: Family Medicine | Admitting: Family Medicine

## 2015-05-13 VITALS — BP 170/121 | HR 74 | Temp 98.6°F | Ht 61.0 in | Wt 146.0 lb

## 2015-05-13 DIAGNOSIS — G8929 Other chronic pain: Secondary | ICD-10-CM | POA: Insufficient documentation

## 2015-05-13 DIAGNOSIS — I10 Essential (primary) hypertension: Secondary | ICD-10-CM | POA: Insufficient documentation

## 2015-05-13 DIAGNOSIS — M549 Dorsalgia, unspecified: Secondary | ICD-10-CM | POA: Diagnosis not present

## 2015-05-13 DIAGNOSIS — C9001 Multiple myeloma in remission: Secondary | ICD-10-CM | POA: Diagnosis not present

## 2015-05-13 LAB — BASIC METABOLIC PANEL
BUN: 10 mg/dL (ref 6–23)
CO2: 25 mEq/L (ref 19–32)
Calcium: 9.5 mg/dL (ref 8.4–10.5)
Chloride: 103 mEq/L (ref 96–112)
Creat: 0.63 mg/dL (ref 0.50–1.10)
Glucose, Bld: 85 mg/dL (ref 70–99)
Potassium: 4.4 mEq/L (ref 3.5–5.3)
Sodium: 144 mEq/L (ref 135–145)

## 2015-05-13 MED ORDER — ACETAMINOPHEN-CODEINE #3 300-30 MG PO TABS: 1.0000 | ORAL_TABLET | Freq: Three times a day (TID) | ORAL | Status: DC | PRN

## 2015-05-13 MED ORDER — AMLODIPINE BESYLATE 10 MG PO TABS: 10.0000 mg | ORAL_TABLET | Freq: Every day | ORAL | Status: DC

## 2015-05-13 NOTE — Patient Instructions (Signed)

## 2015-05-13 NOTE — Progress Notes (Signed)
Patient ID: Ruth Sanchez, female   DOB: 1945-10-28, 69 y.o.   MRN: [id number]    [name], is a 69 y.o. female  WUJ:[id number]  [id number]  DOB - February 03, 1946  CC:  Chief Complaint  Patient presents with  . pain management  . Multiple Myeloma       HPI: Ruth Sanchez is a 69 y.o. female with a history of Multiple Myeloma in remission s/p stem cell transplant in 2004 (currently on Zometa), Hypertension who comes in for a follow up several ED visits for low back pain. She was previously receivng Oxycodone from her PCP, Dr Cathlean Cower ([facility]) whom she has been unable to reach for refills.Her last ED visit was on 05/06/15 where she received Oxycodone tabs which she has run out of. She was also found to have an elevated blood pressure for which she received a script for HCTZ. Labs revealed a hypokalemia of 3.2 and potassium replacement was given.  Today she presents with accelerated BP and has not been taking her HCTZ. She has also run out of her Oxycodone and has low back pain radiating down her left leg. Denies associated numbness or sphincteric dysfunction. She ambulates with the aid of a cane.  Allergies  Allergen Reactions  . Aspirin     Itching and rash   Past Medical History  Diagnosis Date  . Cancer   . Hypertension   . Asthma   . Arthritis    Current Outpatient Prescriptions on File Prior to Visit  Medication Sig Dispense Refill  . ibuprofen (ADVIL,MOTRIN) 200 MG tablet Take 400 mg by mouth every 6 (six) hours as needed for moderate pain.    . hydrochlorothiazide (HYDRODIURIL) 25 MG tablet Take 1 tablet (25 mg total) by mouth daily. (Patient not taking: Reported on 05/13/2015) 30 tablet 0  . naproxen sodium (ANAPROX) 220 MG tablet Take 440 mg by mouth 2 (two) times daily as needed (pain).    Marland Kitchen oxyCODONE (ROXICODONE) 15 MG immediate release tablet Take 1 tablet (15 mg total) by mouth every 6 (six) hours as needed for pain. (Patient not taking:  Reported on 04/21/2015) 20 tablet 0  . oxyCODONE (ROXICODONE) 15 MG immediate release tablet Take 1 tablet (15 mg total) by mouth every 6 (six) hours as needed for severe pain. (Patient not taking: Reported on 05/13/2015) 30 tablet 0  . oxyCODONE-acetaminophen (PERCOCET) 10-325 MG per tablet Take 1 tablet by mouth every 6 (six) hours as needed for pain. For pain. (Patient not taking: Reported on 11/16/2014) 20 tablet 0  . oxyCODONE-acetaminophen (PERCOCET) 10-325 MG per tablet Take 1 tablet by mouth every 8 (eight) hours as needed for pain. (Patient not taking: Reported on 05/13/2015) 30 tablet 0  . potassium chloride SA (K-DUR,KLOR-CON) 20 MEQ tablet Take 1 tablet (20 mEq total) by mouth daily. (Patient not taking: Reported on 04/11/2015) 7 tablet 0  . potassium chloride SA (K-DUR,KLOR-CON) 20 MEQ tablet Take 1 tablet (20 mEq total) by mouth daily. (Patient not taking: Reported on 05/13/2015) 30 tablet 0   No current facility-administered medications on file prior to visit.   No family history on file. History   Social History  . Marital Status: Married    Spouse Name: N/A  . Number of Children: N/A  . Years of Education: N/A   Occupational History  . Not on file.   Social History Main Topics  . Smoking status: Never Smoker   . Smokeless tobacco: Never Used  . Alcohol  Use: No  . Drug Use: No  . Sexual Activity: Not Currently   Other Topics Concern  . Not on file   Social History Narrative    Review of Systems: Constitutional: Negative for fever, chills, diaphoresis, activity change, appetite change and fatigue. HENT: Negative for ear pain, nosebleeds, congestion, facial swelling, rhinorrhea, neck pain, neck stiffness and ear discharge.  Eyes: Negative for pain, discharge, redness, itching and visual disturbance. Respiratory: Negative for cough, choking, chest tightness, shortness of breath, wheezing and stridor.  Cardiovascular: Negative for chest pain, palpitations and leg  swelling. Gastrointestinal: Negative for abdominal distention. Genitourinary: Negative for dysuria, urgency, frequency, hematuria, flank pain, decreased urine volume, difficulty urinating and dyspareunia.  Musculoskeletal: positive for back pain Neurological: Negative for dizziness, tremors, seizures, syncope, facial asymmetry, speech difficulty, weakness, light-headedness, numbness in left leg Hematological: Negative for adenopathy. Does not bruise/bleed easily. Skin: Negative for rash, ulcer. Psychiatric/Behavioral: Negative for hallucinations, behavioral problems, confusion, dysphoric mood, decreased concentration and agitation.    Objective:   Filed Vitals:   05/13/15 1031  BP: 170/121  Pulse: 74  Temp: 98.6 F (37 C)    Physical Exam: Constitutional: Patient appears well-developed and well-nourished. No distress. HENT: Normocephalic, atraumatic, External right and left ear normal. Oropharynx is clear and moist.  Eyes: Conjunctivae and EOM are normal. PERRLA, no scleral icterus. Neck: Normal ROM, No JVD. No tracheal deviation. No thyromegaly. CVS: RRR, S1/S2 +, no murmurs, no gallops, no carotid bruit.  Pulmonary: Effort and breath sounds normal, no stridor, rhonchi, wheezes, rales.  Abdominal: Soft. BS +, no distension, tenderness, rebound or guarding.  Musculoskeletal: Generalized lumbar spine tenderness; positive straight leg raise on the left Lymphadenopathy: No lymphadenopathy noted, cervical, inguinal or axillary Neuro: Alert. Normal reflexes, muscle tone coordination. No cranial nerve deficit. Skin: Skin is warm and dry. No rash noted. Not diaphoretic. No erythema. No pallor. Psychiatric: Normal mood and affect. Behavior, judgment, thought content normal.  Lab Results  Component Value Date   WBC 9.6 04/11/2015   HGB 14.3 05/06/2015   HCT 42.0 05/06/2015   MCV 89.7 04/11/2015   PLT 164 04/11/2015   Lab Results  Component Value Date   CREATININE 0.70 05/06/2015    BUN 12 05/06/2015   NA 142 05/06/2015   K 3.2* 05/06/2015   CL 101 05/06/2015   CO2 27 04/21/2015    No results found for: HGBA1C Lipid Panel  No results found for: CHOL, TRIG, HDL, CHOLHDL, VLDL, LDLCALC     Assessment and plan:  69 year old female with a history of Multiple Myeloma in remission, accelerated Hypertension here for an ED follow up.   Multiple myeloma: Continue Zometa infusion as per protocol and advised to keep appointment with oncology.  Chronic back pain I am referring her to the pain clinic meanwhile I am giving her Tylenol No. 3 tablets to take and I have explained the pain medication policy of the clinic to her and that I will be unable to provide her with oxycodone tablets. High risk for falls; advised to use walker and four pronged cane interchangeably as needed  Hypertension: Hydrochlorothiazide appears on her medication list which she is apparently not taking. I am placing her on amlodipine and I will reassess her blood pressure at her next visit. Low-sodium, DASH diet advised.  Hypokalemia: Basic metabolic panel sent off.   The patient was given clear instructions to go to ER or return to medical center if symptoms don't improve, worsen or new problems develop. The  patient verbalized understanding. The patient was told to call to get lab results if they haven't heard anything in the next week.     Arnoldo Morale, [facility] [phone] 05/13/2015, 11:03 AM

## 2015-05-13 NOTE — Progress Notes (Signed)
Quick Note:  Please inform the patient that labs are normal. Thank you. ______

## 2015-05-13 NOTE — Progress Notes (Signed)
Multiple myeloma for 8 years Patient's PCP "has disappeared" according to her and she has been unable to get her pain medication and has been going to the ED ED referred her here for management. She goes to [facility] for treatment q 3 months

## 2015-05-15 ENCOUNTER — Encounter: Payer: Self-pay | Admitting: Family Medicine

## 2015-05-17 ENCOUNTER — Telehealth: Payer: Self-pay | Admitting: Internal Medicine

## 2015-05-17 ENCOUNTER — Encounter: Payer: Self-pay | Admitting: Internal Medicine

## 2015-05-17 ENCOUNTER — Ambulatory Visit (HOSPITAL_BASED_OUTPATIENT_CLINIC_OR_DEPARTMENT_OTHER): Payer: Medicare Other | Admitting: Internal Medicine

## 2015-05-17 ENCOUNTER — Ambulatory Visit (HOSPITAL_BASED_OUTPATIENT_CLINIC_OR_DEPARTMENT_OTHER): Payer: Medicare Other

## 2015-05-17 VITALS — BP 153/84 | HR 83 | Temp 98.6°F | Resp 18 | Ht 61.0 in | Wt 150.9 lb

## 2015-05-17 DIAGNOSIS — M549 Dorsalgia, unspecified: Secondary | ICD-10-CM

## 2015-05-17 DIAGNOSIS — C9001 Multiple myeloma in remission: Secondary | ICD-10-CM

## 2015-05-17 DIAGNOSIS — M62838 Other muscle spasm: Secondary | ICD-10-CM

## 2015-05-17 DIAGNOSIS — C9000 Multiple myeloma not having achieved remission: Secondary | ICD-10-CM

## 2015-05-17 DIAGNOSIS — G8929 Other chronic pain: Secondary | ICD-10-CM

## 2015-05-17 MED ORDER — ALTEPLASE 2 MG IJ SOLR
2.0000 mg | Freq: Once | INTRAMUSCULAR | Status: DC | PRN
  Filled 2015-05-17: qty 2

## 2015-05-17 MED ORDER — CARISOPRODOL 350 MG PO TABS: 350.0000 mg | ORAL_TABLET | Freq: Two times a day (BID) | ORAL | Status: DC

## 2015-05-17 MED ORDER — ZOLEDRONIC ACID 4 MG/100ML IV SOLN
4.0000 mg | Freq: Once | INTRAVENOUS | Status: AC
  Administered 2015-05-17: 4 mg via INTRAVENOUS
  Filled 2015-05-17: qty 100

## 2015-05-17 MED ORDER — SODIUM CHLORIDE 0.9 % IV SOLN
Freq: Once | INTRAVENOUS | Status: AC
  Administered 2015-05-17: 15:00:00 via INTRAVENOUS

## 2015-05-17 NOTE — Patient Instructions (Signed)

## 2015-05-17 NOTE — Telephone Encounter (Signed)
per pof to sch pt appt-gave pt copy of avs °

## 2015-05-17 NOTE — Progress Notes (Signed)
[facility] [phone]   Fax:([phone]) [place], [place] [address]  DIAGNOSIS: Multiple myeloma, IgG kappa subtype diagnosed in October 2008.   PRIOR THERAPY:  1. Status post radiotherapy to the lower lumbar spine and left pelvic area under the care of Dr. Sondra Come. The patient received a total dose of 3000 cGy. in 15 fractions between 10/14/2007 through 11/07/2007  2.Status post 6 cycles of systemic chemotherapy with Revlimid and Decadron. Last dose given April 2009.  3. status post autologous peripheral blood stem cell transplant on 04/01/2008 under the care of Dr. Valarie Merino at [facility].   CURRENT THERAPY: Zometa 4 mg IV given every 3 months.  INTERVAL HISTORY: Ruth Sanchez 69 y.o. female returns to the clinic today for routine six-month followup visit accompanied by her husband. He is feeling fine today with no specific complaints except for the persistent left hip pain as well as low back pain and muscle spasm. She had referral in early June 2016 but no fractures. She is currently seeing a new primary care physician, Dr. Jarold Song with [facility]. She denied having any nausea or vomiting. She denied having any significant weight loss or night sweats. She denied having any chest pain, shortness of breath with exertion and no cough or hemoptysis. She had repeat myeloma panel performed recently and she is here for evaluation and discussion of her lab results.  MEDICAL HISTORY: Past Medical History  Diagnosis Date  . Cancer   . Hypertension   . Asthma   . Arthritis     ALLERGIES:  is allergic to aspirin.  MEDICATIONS:  Current Outpatient Prescriptions  Medication Sig Dispense Refill  . acetaminophen-codeine (TYLENOL #3) 300-30 MG per tablet Take 1-2 tablets by mouth every 8 (eight) hours as needed for moderate pain. 90 tablet 0  . amLODipine (NORVASC) 10 MG tablet Take 1  tablet (10 mg total) by mouth daily. 90 tablet 3  . hydrochlorothiazide (HYDRODIURIL) 25 MG tablet Take 1 tablet (25 mg total) by mouth daily. (Patient not taking: Reported on 05/13/2015) 30 tablet 0  . ibuprofen (ADVIL,MOTRIN) 200 MG tablet Take 400 mg by mouth every 6 (six) hours as needed for moderate pain.    . naproxen sodium (ANAPROX) 220 MG tablet Take 440 mg by mouth 2 (two) times daily as needed (pain).    Marland Kitchen oxyCODONE (ROXICODONE) 15 MG immediate release tablet Take 1 tablet (15 mg total) by mouth every 6 (six) hours as needed for pain. (Patient not taking: Reported on 04/21/2015) 20 tablet 0  . oxyCODONE (ROXICODONE) 15 MG immediate release tablet Take 1 tablet (15 mg total) by mouth every 6 (six) hours as needed for severe pain. (Patient not taking: Reported on 05/13/2015) 30 tablet 0  . oxyCODONE-acetaminophen (PERCOCET) 10-325 MG per tablet Take 1 tablet by mouth every 6 (six) hours as needed for pain. For pain. (Patient not taking: Reported on 11/16/2014) 20 tablet 0  . oxyCODONE-acetaminophen (PERCOCET) 10-325 MG per tablet Take 1 tablet by mouth every 8 (eight) hours as needed for pain. (Patient not taking: Reported on 05/13/2015) 30 tablet 0  . potassium chloride SA (K-DUR,KLOR-CON) 20 MEQ tablet Take 1 tablet (20 mEq total) by mouth daily. (Patient not taking: Reported on 04/11/2015) 7 tablet 0  . potassium chloride SA (K-DUR,KLOR-CON) 20 MEQ tablet Take 1 tablet (20 mEq total) by mouth daily. (Patient not taking: Reported on 05/13/2015) 30  tablet 0   No current facility-administered medications for this visit.    SURGICAL HISTORY:  Past Surgical History  Procedure Laterality Date  . Appendectomy    . Tubal ligation    . Cholecystectomy      REVIEW OF SYSTEMS:  A comprehensive review of systems was negative except for: Constitutional: positive for fatigue Musculoskeletal: positive for arthralgias, back pain and Muscle spasm   PHYSICAL EXAMINATION: General appearance: alert,  cooperative, fatigued and no distress Head: Normocephalic, without obvious abnormality, atraumatic Neck: no adenopathy Lymph nodes: Cervical, supraclavicular, and axillary nodes normal. Resp: clear to auscultation bilaterally Cardio: regular rate and rhythm, S1, S2 normal, no murmur, click, rub or gallop GI: soft, non-tender; bowel sounds normal; no masses,  no organomegaly Extremities: extremities normal, atraumatic, no cyanosis or edema   ECOG PERFORMANCE STATUS: 1 - Symptomatic but completely ambulatory  Blood pressure 153/84, pulse 83, temperature 98.6 F (37 C), temperature source Oral, resp. rate 18, height $RemoveBe'5\' 1"'vTAFfvuWM$  (1.549 m), weight 150 lb 14.4 oz (68.448 kg), SpO2 98 %.  LABORATORY DATA: Lab Results  Component Value Date   WBC 9.6 04/11/2015   HGB 14.3 05/06/2015   HCT 42.0 05/06/2015   MCV 89.7 04/11/2015   PLT 164 04/11/2015      Chemistry      Component Value Date/Time   NA 144 05/13/2015 1054   NA 143 02/08/2015 1449   K 4.4 05/13/2015 1054   K 3.1* 02/08/2015 1449   CL 103 05/13/2015 1054   CL 104 01/19/2013 1058   CO2 25 05/13/2015 1054   CO2 23 02/08/2015 1449   BUN 10 05/13/2015 1054   BUN 10.9 02/08/2015 1449   CREATININE 0.63 05/13/2015 1054   CREATININE 0.70 05/06/2015 2142   CREATININE 0.7 02/08/2015 1449      Component Value Date/Time   CALCIUM 9.5 05/13/2015 1054   CALCIUM 8.5 02/08/2015 1449   ALKPHOS 113 04/11/2015 1211   ALKPHOS 80 02/08/2015 1449   AST 21 04/11/2015 1211   AST 31 02/08/2015 1449   ALT 14 04/11/2015 1211   ALT 22 02/08/2015 1449   BILITOT 0.8 04/11/2015 1211   BILITOT 1.00 02/08/2015 1449     Other lab results: IgG 1729, IgA 323 and IgM 197.  RADIOGRAPHIC STUDIES: No results found.  ASSESSMENT AND PLAN: This is a very pleasant 69 years old African American female with history of multiple myeloma status post systemic chemotherapy with Revlimid and Decadron followed by this peripheral blood stem cell transplant and has  been observation since June of 2009 with no significant evidence for disease progression.  Quantitative immunoglobulin performed in early June 2016 showed no evidence for disease progression. I discussed the lab result with the patient today and recommended for her to continue on observation with repeat myeloma panel in 6 months.  She will continue her treatment with Zometa every 3 months. For the muscle spasm, I gave the patient one time refill for soma that was previously prescribed by her primary care physician and she was advised to contact her new primary care physician if she needs any further refill. She was advised to call immediately if she has any concerning symptoms in the interval.  All questions were answered. The patient knows to call the clinic with any problems, questions or concerns. We can certainly see the patient much sooner if necessary.  Disclaimer: This note was dictated with voice recognition software. Similar sounding words can inadvertently be transcribed and may not be corrected upon  review.

## 2015-05-18 ENCOUNTER — Telehealth: Payer: Self-pay | Admitting: Family Medicine

## 2015-05-18 NOTE — Telephone Encounter (Signed)
-----   Message from Nila Nephew, RN sent at 05/18/2015  8:39 AM EDT -----   ----- Message -----    From: Arnoldo Morale, MD    Sent: 05/13/2015  10:35 PM      To: Nila Nephew, RN  Please inform the patient that labs are normal. Thank you.

## 2015-05-18 NOTE — Telephone Encounter (Signed)
Called patient. Patient verified name and date of birth. Patient notified that her labs are normal. Patient voiced understanding and stated that she has an appointment on 05/20/15 at 10:30am. Nurse tech confirmed appointment date and time.

## 2015-05-18 NOTE — Telephone Encounter (Signed)
Patient returned phone call, please f/u with pt.    °

## 2015-05-18 NOTE — Telephone Encounter (Signed)
Called patient. Reached voicemail. Left message stating to return call at [phone].

## 2015-05-20 ENCOUNTER — Encounter: Payer: Self-pay | Admitting: Family Medicine

## 2015-05-20 ENCOUNTER — Ambulatory Visit: Payer: Medicare Other | Admitting: Family Medicine

## 2015-05-20 ENCOUNTER — Ambulatory Visit: Payer: Medicare Other | Attending: Family Medicine | Admitting: Family Medicine

## 2015-05-20 VITALS — BP 130/68 | HR 110 | Temp 98.8°F | Resp 20 | Wt 154.0 lb

## 2015-05-20 DIAGNOSIS — C9000 Multiple myeloma not having achieved remission: Secondary | ICD-10-CM | POA: Insufficient documentation

## 2015-05-20 DIAGNOSIS — Z79899 Other long term (current) drug therapy: Secondary | ICD-10-CM | POA: Insufficient documentation

## 2015-05-20 DIAGNOSIS — G8929 Other chronic pain: Secondary | ICD-10-CM | POA: Diagnosis not present

## 2015-05-20 DIAGNOSIS — C9001 Multiple myeloma in remission: Secondary | ICD-10-CM

## 2015-05-20 DIAGNOSIS — I10 Essential (primary) hypertension: Secondary | ICD-10-CM | POA: Diagnosis not present

## 2015-05-20 DIAGNOSIS — M545 Low back pain: Secondary | ICD-10-CM | POA: Diagnosis not present

## 2015-05-20 DIAGNOSIS — E876 Hypokalemia: Secondary | ICD-10-CM | POA: Insufficient documentation

## 2015-05-20 DIAGNOSIS — M549 Dorsalgia, unspecified: Secondary | ICD-10-CM | POA: Diagnosis not present

## 2015-05-20 MED ORDER — GABAPENTIN 300 MG PO CAPS: 300.0000 mg | ORAL_CAPSULE | Freq: Two times a day (BID) | ORAL | Status: DC

## 2015-05-20 MED ORDER — ACETAMINOPHEN-CODEINE #3 300-30 MG PO TABS: 1.0000 | ORAL_TABLET | Freq: Three times a day (TID) | ORAL | Status: DC | PRN

## 2015-05-20 NOTE — Patient Instructions (Signed)

## 2015-05-20 NOTE — Progress Notes (Signed)
Pt is here for a follow on BP BP today is 130/68... Had her meds this am Alert, no acute distress.

## 2015-05-20 NOTE — Progress Notes (Addendum)
Subjective:    Patient ID: Ruth Sanchez, female    DOB: [birth year], 69 y.o.   MRN: [id number]  HPI   69 year old female patient with a history of multiple myeloma in remission status post stem cell transplant in 2004, hypertension, low back pain here for follow-up visit At her last office visit her blood pressure was severely elevated which she had attributed to not taking her medications and was scheduled to return for reassessment of her blood pressure which is much improved today.  She complains of persisting low back pain radiating down the left leg which feels like "her leg has something in it"and she has to keep flexing and extending her left leg to feel better. She also has a shooting pain down her leg. I had referred her to the pain management clinic as she is yet to obtain an appointment; I gave her Tylenol No. 3 at her last visit which does help some and she received Soma from her oncologist. Denies loss of sphincteric function; ambulates with the aid of a cane.  Past Medical History  Diagnosis Date  . Cancer   . Hypertension   . Asthma   . Arthritis     Past Surgical History  Procedure Laterality Date  . Appendectomy    . Tubal ligation    . Cholecystectomy      History   Social History  . Marital Status: Married    Spouse Name: N/A  . Number of Children: N/A  . Years of Education: N/A   Occupational History  . Not on file.   Social History Main Topics  . Smoking status: Never Smoker   . Smokeless tobacco: Never Used  . Alcohol Use: No  . Drug Use: No  . Sexual Activity: Not Currently   Other Topics Concern  . Not on file   Social History Narrative    Allergies  Allergen Reactions  . Aspirin     Itching and rash    Current Outpatient Prescriptions on File Prior to Visit  Medication Sig Dispense Refill  . amLODipine (NORVASC) 10 MG tablet Take 1 tablet (10 mg total) by mouth daily. 90 tablet 3  . carisoprodol (SOMA) 350 MG tablet Take 1  tablet (350 mg total) by mouth 2 (two) times daily with a meal. As needed for muscle spasm. 30 tablet 0  . hydrochlorothiazide (HYDRODIURIL) 25 MG tablet Take 1 tablet (25 mg total) by mouth daily. 30 tablet 0  . naproxen sodium (ANAPROX) 220 MG tablet Take 440 mg by mouth 2 (two) times daily as needed (pain).    Marland Kitchen oxyCODONE (ROXICODONE) 15 MG immediate release tablet Take 1 tablet (15 mg total) by mouth every 6 (six) hours as needed for pain. 20 tablet 0  . potassium chloride SA (K-DUR,KLOR-CON) 20 MEQ tablet Take 1 tablet (20 mEq total) by mouth daily. 7 tablet 0  . oxyCODONE (ROXICODONE) 15 MG immediate release tablet Take 1 tablet (15 mg total) by mouth every 6 (six) hours as needed for severe pain. (Patient not taking: Reported on 05/13/2015) 30 tablet 0  . oxyCODONE-acetaminophen (PERCOCET) 10-325 MG per tablet Take 1 tablet by mouth every 6 (six) hours as needed for pain. For pain. (Patient not taking: Reported on 11/16/2014) 20 tablet 0  . oxyCODONE-acetaminophen (PERCOCET) 10-325 MG per tablet Take 1 tablet by mouth every 8 (eight) hours as needed for pain. (Patient not taking: Reported on 05/13/2015) 30 tablet 0  . potassium chloride SA (K-DUR,KLOR-CON) 20 MEQ tablet  Take 1 tablet (20 mEq total) by mouth daily. 30 tablet 0   No current facility-administered medications on file prior to visit.     Review of Systems  Constitutional: Negative for fever, chills, diaphoresis, activity change, appetite change and fatigue. HENT: Negative for ear pain, nosebleeds, congestion, facial swelling, rhinorrhea, neck pain, neck stiffness and ear discharge.  Eyes: Negative for pain, discharge, redness, itching and visual disturbance. Respiratory: Negative for cough, choking, chest tightness, shortness of breath, wheezing and stridor.  Cardiovascular: Negative for chest pain, palpitations and leg swelling. Gastrointestinal: Negative for abdominal distention. Genitourinary: Negative for dysuria, urgency,  frequency, hematuria, flank pain, decreased urine volume, difficulty urinating and dyspareunia.  Musculoskeletal: positive for back pain Neurological: Negative for dizziness, tremors, seizures, syncope, facial asymmetry, speech difficulty, weakness, light-headedness, numbness in left leg Hematological: Negative for adenopathy. Does not bruise/bleed easily. Skin: Negative for rash, ulcer. Psychiatric/Behavioral: Negative for hallucinations, behavioral problems, confusion, dysphoric mood, decreased concentration and agitation.        Objective: Filed Vitals:   05/20/15 1049 05/20/15 1105  BP: 130/68   Pulse: 112 110  Temp: 98.8 F (37.1 C)   TempSrc: Oral   Resp: 20   Weight: 154 lb (69.854 kg)   SpO2: 100%       Physical Exam  Constitutional: Patient appears well-developed and well-nourished. No distress. Neck: Normal ROM, No JVD. No tracheal deviation. No thyromegaly. CVS: tachycardic, regular rhythm, S1/S2 +, no murmurs, no gallops, no carotid bruit.  Pulmonary: Effort and breath sounds normal, no stridor, rhonchi, wheezes, rales.  Abdominal: Soft. BS +, no distension, tenderness, rebound or guarding.  Musculoskeletal: Generalized lumbar spine tenderness; positive straight leg raise on the left Lymphadenopathy: No lymphadenopathy noted, cervical, inguinal or axillary Neuro: Alert. Normal reflexes, muscle tone coordination. No cranial nerve deficit. Skin: Skin is warm and dry. No rash noted. Not diaphoretic. No erythema. No pallor. Psychiatric: Normal mood and affect. Behavior, judgment, thought content normal.         Assessment & Plan:  69 year old female with a history of Multiple Myeloma in remission, accelerated Hypertension here for a follow up visit.   Multiple myeloma: Had a visit with Oncologist- Dr Earlie Server yesterday. Continue Zometa infusion as per protocol.  Chronic back pain Gabapentin added to regimen. Awaiting appointment with the pain clinic meanwhile  I am giving her Tylenol No. 3 tablets to take and I have explained the pain medication policy of the clinic to her and that I will be unable to provide her with oxycodone tablets. High risk for falls; advised to use walker and four pronged cane interchangeably as needed  Hypertension: Controlled, slightly tachycardic. Continue Amlodipine. Low-sodium, DASH diet advised.  Hypokalemia: Resolved after HCTZ was discontinued.   The patient was given clear instructions to go to ER or return to medical center if symptoms don't improve, worsen or new problems develop. The patient verbalized understanding. The patient was told to call to get lab results if they haven't heard anything in the next week.

## 2015-06-03 ENCOUNTER — Ambulatory Visit: Payer: Medicare Other | Attending: Family Medicine | Admitting: Family Medicine

## 2015-06-03 ENCOUNTER — Encounter: Payer: Self-pay | Admitting: Family Medicine

## 2015-06-03 VITALS — BP 158/105 | HR 101 | Temp 99.0°F | Ht 61.0 in | Wt 152.0 lb

## 2015-06-03 DIAGNOSIS — I10 Essential (primary) hypertension: Secondary | ICD-10-CM | POA: Diagnosis not present

## 2015-06-03 DIAGNOSIS — M549 Dorsalgia, unspecified: Secondary | ICD-10-CM | POA: Insufficient documentation

## 2015-06-03 DIAGNOSIS — C9001 Multiple myeloma in remission: Secondary | ICD-10-CM | POA: Diagnosis not present

## 2015-06-03 DIAGNOSIS — G8929 Other chronic pain: Secondary | ICD-10-CM | POA: Diagnosis not present

## 2015-06-03 DIAGNOSIS — R000 Tachycardia, unspecified: Secondary | ICD-10-CM | POA: Insufficient documentation

## 2015-06-03 MED ORDER — TRAMADOL HCL 50 MG PO TABS: 50.0000 mg | ORAL_TABLET | Freq: Four times a day (QID) | ORAL | Status: DC | PRN

## 2015-06-03 NOTE — Progress Notes (Signed)
Patient here to follow up on her chronic back pain and HTN Patient states pain is greater than 10/10 in her lower back that radiates down her left leg She has an appointment at the pain clinic on 8/24 and states that the "Tylenol 3 is causing her to have asthma and I cannot take it." I explained our drug policy. Patient blood pressure is 158/105 and she took her mediations at 0430

## 2015-06-03 NOTE — Progress Notes (Signed)
Subjective:    Patient ID: Ruth Sanchez, female    DOB: 01/21/1946, 69 y.o.   MRN: [id number]  HPI Ruth Sanchez is a 69 year old female patient with a history of multiple myeloma in remission status post stem cell transplant in 2004, hypertension, low back pain here for follow-up visit.  I had placed her on gabapentin at her last office visit which she is taking in addition to Tylenol 3 but reports that the Tylenol 3 causes asthma flares. She was referred to pain clinic but was informed she would need $120 for her appointment and is yet to make an appointment there but is requesting something else for pain in the meantime. She uses a back brace which helps her symptoms.  Her blood pressure is mildly elevated today despite taking her antihypertensives but she attributes this to pain in her lower back and legs.  Past Medical History  Diagnosis Date  . Cancer   . Hypertension   . Asthma   . Arthritis     Past Surgical History  Procedure Laterality Date  . Appendectomy    . Tubal ligation    . Cholecystectomy      Social History   Social History  . Marital Status: Married    Spouse Name: N/A  . Number of Children: N/A  . Years of Education: N/A   Occupational History  . Not on file.   Social History Main Topics  . Smoking status: Never Smoker   . Smokeless tobacco: Never Used  . Alcohol Use: No  . Drug Use: No  . Sexual Activity: Not Currently   Other Topics Concern  . Not on file   Social History Narrative     Review of Systems Constitutional: Negative for fever, chills, diaphoresis, activity change, appetite change and fatigue. HENT: Negative for ear pain, nosebleeds, congestion, facial swelling, rhinorrhea, neck pain, neck stiffness and ear discharge.  Eyes: Negative for pain, discharge, redness, itching and visual disturbance. Respiratory: Negative for cough, choking, chest tightness, shortness of breath, wheezing and stridor.  Cardiovascular: Negative  for chest pain, palpitations and leg swelling. Gastrointestinal: Negative for abdominal distention. Genitourinary: Negative for dysuria, urgency, frequency, hematuria, flank pain, decreased urine volume, difficulty urinating and dyspareunia.  Musculoskeletal: positive for back pain Neurological: Negative for dizziness, tremors, seizures, syncope, facial asymmetry, speech difficulty, weakness, light-headedness, positive for numbness in left leg Hematological: Negative for adenopathy. Does not bruise/bleed easily. Skin: Negative for rash, ulcer. Psychiatric/Behavioral: Negative for hallucinations, behavioral problems, confusion, dysphoric mood, decreased concentration and agitation.       Objective: Filed Vitals:   06/03/15 1045  BP: 158/105  Pulse: 101  Temp: 99 F (37.2 C)  Height: '5\' 1"'  (1.549 m)  Weight: 152 lb (68.947 kg)  SpO2: 100%      Physical Exam  Constitutional: Patient appears well-developed and well-nourished. No distress. CVS: tachycardic, regular rhythm, S1/S2 +, no murmurs, no gallops, no carotid bruit.  Pulmonary: Effort and breath sounds normal, no stridor, rhonchi, wheezes, rales.  Abdominal: Soft. BS +, no distension, tenderness, rebound or guarding.  Musculoskeletal: Generalized lumbar spine tenderness; positive straight leg raise on the left Lymphadenopathy: No lymphadenopathy noted, cervical, inguinal or axillary Neuro: Alert. Normal reflexes, muscle tone coordination. No cranial nerve deficit. Skin: Skin is warm and dry. No rash noted. Not diaphoretic. No erythema. No pallor. Psychiatric: Normal mood and affect. Behavior, judgment, thought content normal.       Assessment & Plan:  Multiple myeloma: Continue Zometa infusion as per  protocol.  Chronic back pain Discontinue Tylenol No. 3 and placed on tramadol Continue Gabapentin. Awaiting appointment with the pain clinic. High risk for falls; advised to use walker and four pronged cane interchangeably  as needed  Hypertension: Unontrolled, slightly tachycardic. Continue Amlodipine; no regimen changes she was previously controlled. Low-sodium, DASH diet advised.   The patient was given clear instructions to go to ER or return to medical center if symptoms don't improve, worsen or new problems develop. The patient verbalized understanding. The patient was told to call to get lab results if they haven't heard anything in the next week.

## 2015-06-03 NOTE — Patient Instructions (Signed)

## 2015-07-01 ENCOUNTER — Encounter: Payer: Self-pay | Admitting: Family Medicine

## 2015-07-01 ENCOUNTER — Ambulatory Visit: Payer: Medicare Other | Attending: Family Medicine | Admitting: Family Medicine

## 2015-07-01 VITALS — BP 123/86 | HR 97 | Temp 99.0°F | Resp 18 | Ht 60.0 in | Wt 157.0 lb

## 2015-07-01 DIAGNOSIS — M549 Dorsalgia, unspecified: Secondary | ICD-10-CM | POA: Insufficient documentation

## 2015-07-01 DIAGNOSIS — I10 Essential (primary) hypertension: Secondary | ICD-10-CM | POA: Insufficient documentation

## 2015-07-01 DIAGNOSIS — G8929 Other chronic pain: Secondary | ICD-10-CM | POA: Diagnosis not present

## 2015-07-01 DIAGNOSIS — C9001 Multiple myeloma in remission: Secondary | ICD-10-CM | POA: Diagnosis present

## 2015-07-01 MED ORDER — CARISOPRODOL 350 MG PO TABS: 350.0000 mg | ORAL_TABLET | Freq: Two times a day (BID) | ORAL | Status: DC | PRN

## 2015-07-01 MED ORDER — ACETAMINOPHEN-CODEINE #3 300-30 MG PO TABS: 1.0000 | ORAL_TABLET | Freq: Three times a day (TID) | ORAL | Status: DC | PRN

## 2015-07-01 NOTE — Patient Instructions (Signed)

## 2015-07-01 NOTE — Progress Notes (Signed)
Subjective:    Patient ID: Ruth Sanchez, female    DOB: [birth year], 69 y.o.   MRN: [id number]  HPI  Ruth Sanchez is a 69 year old female patient with a history of multiple myeloma in remission status post stem cell transplant in 2004, hypertension, low back pain here for follow-up visit She has been compliant with her antihypertensives and is working on a low-sodium diet..  She complains of persisting low back pain radiating down the left leg which feels like "her leg has something in it"and she has to keep flexing and extending her left leg to feel better. She also has a shooting pain down her leg. I had referred her to the pain management clinic which she does not want to go to and is requesting a refill of Tylenol No. 3 which she had previously stopped due to its affecting her asthma but now she wants to go back on it because she does have an MDI she can use when she wheezes. States tramadol does not help much and she noticed some swelling of her lip while taking gabapentin so she stopped it.   Remains on Zometa for multiple myeloma. Denies loss of sphincteric function; ambulates with the aid of a cane.   Past Medical History  Diagnosis Date  . Cancer   . Hypertension   . Asthma   . Arthritis     Past Surgical History  Procedure Laterality Date  . Appendectomy    . Tubal ligation    . Cholecystectomy      Social History   Social History  . Marital Status: Married    Spouse Name: N/A  . Number of Children: N/A  . Years of Education: N/A   Occupational History  . Not on file.   Social History Main Topics  . Smoking status: Never Smoker   . Smokeless tobacco: Never Used  . Alcohol Use: No  . Drug Use: No  . Sexual Activity: Not Currently   Other Topics Concern  . Not on file   Social History Narrative    Allergies  Allergen Reactions  . Aspirin     Itching and rash  . Gabapentin Swelling    Current Outpatient Prescriptions on File Prior to Visit    Medication Sig Dispense Refill  . amLODipine (NORVASC) 10 MG tablet Take 1 tablet (10 mg total) by mouth daily. 90 tablet 3  . hydrochlorothiazide (HYDRODIURIL) 25 MG tablet Take 1 tablet (25 mg total) by mouth daily. 30 tablet 0  . naproxen sodium (ANAPROX) 220 MG tablet Take 440 mg by mouth 2 (two) times daily as needed (pain).    Marland Kitchen oxyCODONE (ROXICODONE) 15 MG immediate release tablet Take 1 tablet (15 mg total) by mouth every 6 (six) hours as needed for pain. 20 tablet 0  . potassium chloride SA (K-DUR,KLOR-CON) 20 MEQ tablet Take 1 tablet (20 mEq total) by mouth daily. 7 tablet 0  . potassium chloride SA (K-DUR,KLOR-CON) 20 MEQ tablet Take 1 tablet (20 mEq total) by mouth daily. 30 tablet 0   No current facility-administered medications on file prior to visit.      Review of Systems    Constitutional: Negative for fever, chills, diaphoresis, activity change, appetite change and fatigue. HENT: Negative for ear pain, nosebleeds, congestion, facial swelling, rhinorrhea, neck pain, neck stiffness and ear discharge.  Eyes: Negative for pain, discharge, redness, itching and visual disturbance. Respiratory: Negative for cough, choking, chest tightness, shortness of breath, wheezing and stridor.  Cardiovascular: Negative  for chest pain, palpitations and leg swelling. Gastrointestinal: Negative for abdominal distention. Genitourinary: Negative for dysuria, urgency, frequency, hematuria, flank pain, decreased urine volume, difficulty urinating and dyspareunia.  Musculoskeletal: positive for back pain Neurological: Negative for dizziness, tremors, seizures, syncope, facial asymmetry, speech difficulty, weakness, light-headedness, numbness in left leg Hematological: Negative for adenopathy. Does not bruise/bleed easily. Skin: Negative for rash, ulcer. Psychiatric/Behavioral: Negative for hallucinations, behavioral problems, confusion, dysphoric mood, decreased concentration and agitation.    Objective: Filed Vitals:   07/01/15 1031  BP: 123/86  Pulse: 97  Temp: 99 F (37.2 C)  TempSrc: Oral  Resp: 18  Height: 5' (1.524 m)  Weight: 157 lb (71.215 kg)  SpO2: 100%      Physical Exam Constitutional: Patient appears well-developed and well-nourished. No distress. Neck: Normal ROM, No JVD. No tracheal deviation. No thyromegaly. CVS: tachycardic, regular rhythm, S1/S2 +, no murmurs, no gallops, no carotid bruit.  Pulmonary: Effort and breath sounds normal, no stridor, rhonchi, wheezes, rales.  Abdominal: Soft. BS +, no distension, tenderness, rebound or guarding.  Musculoskeletal: Generalized lumbar spine tenderness; positive straight leg raise on the left Lymphadenopathy: No lymphadenopathy noted, cervical, inguinal or axillary Neuro: Alert. Normal reflexes, muscle tone coordination. No cranial nerve deficit. Skin: Skin is warm and dry. No rash noted. Not diaphoretic. No erythema. No pallor. Psychiatric: Normal mood and affect. Behavior, judgment, thought content normal.        Assessment & Plan:  69 year old female with a history of Multiple Myeloma in remission,  Hypertension here for a follow up visit.   Multiple myeloma: Continue Zometa infusion as per protocol.  Chronic back pain Unable to tolerate gabapentin Refuses to go to the pain clinic and so I have refilled Tylenol 3 and Soma for her and will only refill that in a month's time. High risk for falls; advised to use walker and four pronged cane interchangeably as needed  Hypertension: Controlled. Continue Amlodipine. Low-sodium, DASH diet advised.

## 2015-07-01 NOTE — Progress Notes (Signed)
F/U  chronic back pain and HTN Blood pressure within range today  Patient states pain is greater than 10/10 in her lower back Hx bone cancer. Presently going to Dr Earlie Server. No hx tobacco

## 2015-07-06 ENCOUNTER — Telehealth: Payer: Self-pay | Admitting: General Practice

## 2015-07-06 ENCOUNTER — Other Ambulatory Visit: Payer: Self-pay | Admitting: *Deleted

## 2015-07-06 DIAGNOSIS — J45901 Unspecified asthma with (acute) exacerbation: Secondary | ICD-10-CM

## 2015-07-06 MED ORDER — ALBUTEROL SULFATE HFA 108 (90 BASE) MCG/ACT IN AERS: 2.0000 | INHALATION_SPRAY | Freq: Four times a day (QID) | RESPIRATORY_TRACT | Status: DC | PRN

## 2015-07-06 NOTE — Telephone Encounter (Signed)
Patient called requesting an inhaler, patient states that she is having trouble breathing. Please f/u

## 2015-07-23 ENCOUNTER — Other Ambulatory Visit: Payer: Self-pay | Admitting: Family Medicine

## 2015-08-03 ENCOUNTER — Ambulatory Visit: Payer: Medicare Other | Attending: Family Medicine | Admitting: Family Medicine

## 2015-08-03 ENCOUNTER — Encounter: Payer: Self-pay | Admitting: Family Medicine

## 2015-08-03 VITALS — BP 158/98 | HR 100 | Temp 99.0°F | Resp 16 | Ht 62.0 in | Wt 151.0 lb

## 2015-08-03 DIAGNOSIS — C9001 Multiple myeloma in remission: Secondary | ICD-10-CM | POA: Diagnosis not present

## 2015-08-03 DIAGNOSIS — M549 Dorsalgia, unspecified: Secondary | ICD-10-CM | POA: Diagnosis not present

## 2015-08-03 DIAGNOSIS — R32 Unspecified urinary incontinence: Secondary | ICD-10-CM | POA: Insufficient documentation

## 2015-08-03 DIAGNOSIS — I10 Essential (primary) hypertension: Secondary | ICD-10-CM | POA: Insufficient documentation

## 2015-08-03 DIAGNOSIS — G8929 Other chronic pain: Secondary | ICD-10-CM | POA: Insufficient documentation

## 2015-08-03 DIAGNOSIS — N3941 Urge incontinence: Secondary | ICD-10-CM | POA: Insufficient documentation

## 2015-08-03 MED ORDER — CARISOPRODOL 350 MG PO TABS: 350.0000 mg | ORAL_TABLET | Freq: Two times a day (BID) | ORAL | Status: DC | PRN

## 2015-08-03 MED ORDER — OXYBUTYNIN CHLORIDE 5 MG PO TABS: 5.0000 mg | ORAL_TABLET | Freq: Two times a day (BID) | ORAL | Status: DC

## 2015-08-03 NOTE — Progress Notes (Signed)
Subjective:    Patient ID: Ruth Sanchez, female    DOB: [birth year], 69 y.o.   MRN: [id number]  HPI Ruth Sanchez is a 69 year old female patient with a history of multiple myeloma in remission status post stem cell transplant in 2004, hypertension, low back pain here for follow-up visit She is yet to take her morning dose of antihypertensives hence elevated blood pressure; adheres to a low sodium diet but is unable to exercise due to her back pain.  She suffers from chronic back pain and leg pain which feels like "it is in her bones" I had referred her to the pain management clinic which she does not want to go to and is requesting a refill of Tylenol No. 3 and Soma which help with her symptoms   Remains on Zometa for multiple myeloma. Complains of increased urinary frequency; states she had this same symptoms 8 years ago was previously on a "pill" for it.  Past Medical History  Diagnosis Date  . Cancer ([place])   . Hypertension   . Asthma   . Arthritis     Past Surgical History  Procedure Laterality Date  . Appendectomy    . Tubal ligation    . Cholecystectomy      Social History   Social History  . Marital Status: Married    Spouse Name: N/A  . Number of Children: N/A  . Years of Education: N/A   Occupational History  . Not on file.   Social History Main Topics  . Smoking status: Never Smoker   . Smokeless tobacco: Never Used  . Alcohol Use: No  . Drug Use: No  . Sexual Activity: Not Currently   Other Topics Concern  . Not on file   Social History Narrative    Allergies  Allergen Reactions  . Aspirin     Itching and rash  . Gabapentin Swelling    Current Outpatient Prescriptions on File Prior to Visit  Medication Sig Dispense Refill  . acetaminophen-codeine (TYLENOL #3) 300-30 MG per tablet Take 1-2 tablets by mouth every 8 (eight) hours as needed for moderate pain. 90 tablet 1  . albuterol (PROVENTIL HFA;VENTOLIN HFA) 108 (90 BASE) MCG/ACT inhaler  Inhale 2 puffs into the lungs every 6 (six) hours as needed for wheezing or shortness of breath. 1 Inhaler 0  . amLODipine (NORVASC) 10 MG tablet Take 1 tablet (10 mg total) by mouth daily. 90 tablet 3  . hydrochlorothiazide (HYDRODIURIL) 25 MG tablet Take 1 tablet (25 mg total) by mouth daily. 30 tablet 0  . naproxen sodium (ANAPROX) 220 MG tablet Take 440 mg by mouth 2 (two) times daily as needed (pain).    Marland Kitchen oxyCODONE (ROXICODONE) 15 MG immediate release tablet Take 1 tablet (15 mg total) by mouth every 6 (six) hours as needed for pain. (Patient not taking: Reported on 08/03/2015) 20 tablet 0  . potassium chloride SA (K-DUR,KLOR-CON) 20 MEQ tablet Take 1 tablet (20 mEq total) by mouth daily. (Patient not taking: Reported on 08/03/2015) 7 tablet 0  . potassium chloride SA (K-DUR,KLOR-CON) 20 MEQ tablet Take 1 tablet (20 mEq total) by mouth daily. (Patient not taking: Reported on 08/03/2015) 30 tablet 0   No current facility-administered medications on file prior to visit.      Review of Systems Constitutional: Negative for fever, chills, diaphoresis, activity change, appetite change and fatigue. HENT: Negative for ear pain, nosebleeds, congestion, facial swelling, rhinorrhea, neck pain, neck stiffness and ear discharge.  Eyes: Negative  for pain, discharge, redness, itching and visual disturbance. Respiratory: Negative for cough, choking, chest tightness, shortness of breath, wheezing and stridor.  Cardiovascular: Negative for chest pain, palpitations and leg swelling. Gastrointestinal: Negative for abdominal distention. Genitourinary: See history of present illness Musculoskeletal: positive for back pain Neurological: Negative for dizziness, tremors, seizures, syncope, facial asymmetry, speech difficulty, weakness, light-headedness, numbness in left leg Hematological: Negative for adenopathy. Does not bruise/bleed easily. Skin: Negative for rash, ulcer. Psychiatric/Behavioral: Negative for  hallucinations, behavioral problems, confusion, dysphoric mood, decreased concentration and agitation.      Objective: Filed Vitals:   08/03/15 1011 08/03/15 1025  BP: 166/104 158/98  Pulse: 100   Temp: 99 F (37.2 C)   TempSrc: Oral   Resp: 16   Height: '5\' 2"'  (1.575 m)   Weight: 151 lb (68.493 kg)   SpO2: 97%       Physical Exam Constitutional: Patient appears well-developed and well-nourished. No distress. Neck: Normal ROM, No JVD. No tracheal deviation. No thyromegaly. CVS: tachycardic, regular rhythm, S1/S2 +, no murmurs, no gallops, no carotid bruit.  Pulmonary: Effort and breath sounds normal, no stridor, rhonchi, wheezes, rales.  Abdominal: Soft. BS +, no distension, tenderness, rebound or guarding.  Musculoskeletal: Generalized lumbar spine tenderness; positive straight leg raise on the left Lymphadenopathy: No lymphadenopathy noted, cervical, inguinal or axillary Neuro: Alert. Normal reflexes, muscle tone coordination. No cranial nerve deficit. Skin: Skin is warm and dry. No rash noted. Not diaphoretic. No erythema. No pallor. Psychiatric: Normal mood and affect. Behavior, judgment, thought content normal.        Assessment & Plan:  69 year old female with a history of Multiple Myeloma in remission,  Hypertension here for a follow up visit.   Multiple myeloma: Continue Zometa infusion as per protocol.  Chronic back pain Unable to tolerate gabapentin Refuses to go to the pain clinic and so I have refilled Tylenol 3 and Soma for her and will only refill that in a month's time. High risk for falls; advised to use walker and four pronged cane interchangeably as needed  Hypertension: Uncontrolled due to missing morning dose of antihypertensive. Continue Amlodipine, HCTZ. Basic metabolic panel at her next visit. Might consider switching from HCTZ to lisinopril if urinary incontinence persist. Low-sodium, DASH diet advised.  Urinary incontinence: Placed on  Oxybutinin.

## 2015-08-03 NOTE — Progress Notes (Signed)
Pt's here for f/up for her cancer. Pt rates pain in her back at 10 today. She states there's pain in her bones. Pt also reports having pain in her back describing it as a sharp pain in lower back mainly on L side. Pt reports not taking BP meds this morning.

## 2015-08-03 NOTE — Patient Instructions (Signed)
Hypertension Hypertension, commonly called high blood pressure, is when the force of blood pumping through your arteries is too strong. Your arteries are the blood vessels that carry blood from your heart throughout your body. A blood pressure reading consists of a higher number over a lower number, such as 110/72. The higher number (systolic) is the pressure inside your arteries when your heart pumps. The lower number (diastolic) is the pressure inside your arteries when your heart relaxes. Ideally you want your blood pressure below 120/80. Hypertension forces your heart to work harder to pump blood. Your arteries may become narrow or stiff. Having untreated or uncontrolled hypertension can cause heart attack, stroke, kidney disease, and other problems. RISK FACTORS Some risk factors for high blood pressure are controllable. Others are not.  Risk factors you cannot control include:   Race. You may be at higher risk if you are African American.  Age. Risk increases with age.  Gender. Men are at higher risk than women before age 45 years. After age 65, women are at higher risk than men. Risk factors you can control include:  Not getting enough exercise or physical activity.  Being overweight.  Getting too much fat, sugar, calories, or salt in your diet.  Drinking too much alcohol. SIGNS AND SYMPTOMS Hypertension does not usually cause signs or symptoms. Extremely high blood pressure (hypertensive crisis) may cause headache, anxiety, shortness of breath, and nosebleed. DIAGNOSIS To check if you have hypertension, your health care provider will measure your blood pressure while you are seated, with your arm held at the level of your heart. It should be measured at least twice using the same arm. Certain conditions can cause a difference in blood pressure between your right and left arms. A blood pressure reading that is higher than normal on one occasion does not mean that you need treatment. If  it is not clear whether you have high blood pressure, you may be asked to return on a different day to have your blood pressure checked again. Or, you may be asked to monitor your blood pressure at home for 1 or more weeks. TREATMENT Treating high blood pressure includes making lifestyle changes and possibly taking medicine. Living a healthy lifestyle can help lower high blood pressure. You may need to change some of your habits. Lifestyle changes may include:  Following the DASH diet. This diet is high in fruits, vegetables, and whole grains. It is low in salt, red meat, and added sugars.  Keep your sodium intake below 2,300 mg per day.  Getting at least 30-45 minutes of aerobic exercise at least 4 times per week.  Losing weight if necessary.  Not smoking.  Limiting alcoholic beverages.  Learning ways to reduce stress. Your health care provider may prescribe medicine if lifestyle changes are not enough to get your blood pressure under control, and if one of the following is true:  You are 18-59 years of age and your systolic blood pressure is above 140.  You are 60 years of age or older, and your systolic blood pressure is above 150.  Your diastolic blood pressure is above 90.  You have diabetes, and your systolic blood pressure is over 140 or your diastolic blood pressure is over 90.  You have kidney disease and your blood pressure is above 140/90.  You have heart disease and your blood pressure is above 140/90. Your personal target blood pressure may vary depending on your medical conditions, your age, and other factors. HOME CARE INSTRUCTIONS    Have your blood pressure rechecked as directed by your health care provider.   Take medicines only as directed by your health care provider. Follow the directions carefully. Blood pressure medicines must be taken as prescribed. The medicine does not work as well when you skip doses. Skipping doses also puts you at risk for  problems.  Do not smoke.   Monitor your blood pressure at home as directed by your health care provider. SEEK MEDICAL CARE IF:   You think you are having a reaction to medicines taken.  You have recurrent headaches or feel dizzy.  You have swelling in your ankles.  You have trouble with your vision. SEEK IMMEDIATE MEDICAL CARE IF:  You develop a severe headache or confusion.  You have unusual weakness, numbness, or feel faint.  You have severe chest or abdominal pain.  You vomit repeatedly.  You have trouble breathing. MAKE SURE YOU:   Understand these instructions.  Will watch your condition.  Will get help right away if you are not doing well or get worse.   This information is not intended to replace advice given to you by your health care provider. Make sure you discuss any questions you have with your health care provider.   Document Released: 10/08/2005 Document Revised: 02/22/2015 Document Reviewed: 07/31/2013 Elsevier Interactive Patient Education 2016 [employer]  

## 2015-08-09 ENCOUNTER — Ambulatory Visit (HOSPITAL_BASED_OUTPATIENT_CLINIC_OR_DEPARTMENT_OTHER): Payer: Medicare Other

## 2015-08-09 ENCOUNTER — Other Ambulatory Visit: Payer: Self-pay | Admitting: *Deleted

## 2015-08-09 VITALS — BP 123/67 | HR 73 | Temp 99.2°F

## 2015-08-09 DIAGNOSIS — C9000 Multiple myeloma not having achieved remission: Secondary | ICD-10-CM | POA: Diagnosis present

## 2015-08-09 DIAGNOSIS — C9001 Multiple myeloma in remission: Secondary | ICD-10-CM

## 2015-08-09 LAB — COMPREHENSIVE METABOLIC PANEL (CC13)
ALBUMIN: 3.5 g/dL (ref 3.5–5.0)
ALK PHOS: 120 U/L (ref 40–150)
ALT: 28 U/L (ref 0–55)
ANION GAP: 6 meq/L (ref 3–11)
AST: 24 U/L (ref 5–34)
BILIRUBIN TOTAL: 0.53 mg/dL (ref 0.20–1.20)
BUN: 8.6 mg/dL (ref 7.0–26.0)
CALCIUM: 9.2 mg/dL (ref 8.4–10.4)
CHLORIDE: 106 meq/L (ref 98–109)
CO2: 29 mEq/L (ref 22–29)
CREATININE: 0.7 mg/dL (ref 0.6–1.1)
EGFR: >90 mL/min/{1.73_m2} (ref >90)
Glucose: 97 mg/dl (ref 70–140)
Potassium: 4.2 mEq/L (ref 3.5–5.1)
Sodium: 141 mEq/L (ref 136–145)
TOTAL PROTEIN: 6.8 g/dL (ref 6.4–8.3)

## 2015-08-09 MED ORDER — ZOLEDRONIC ACID 4 MG/100ML IV SOLN
4.0000 mg | Freq: Once | INTRAVENOUS | Status: AC
  Administered 2015-08-09: 4 mg via INTRAVENOUS
  Filled 2015-08-09: qty 100

## 2015-08-09 NOTE — Patient Instructions (Signed)

## 2015-08-15 ENCOUNTER — Emergency Department (HOSPITAL_COMMUNITY)
Admission: EM | Admit: 2015-08-15 | Discharge: 2015-08-15 | Disposition: A | Discharge status: Home / Self Care | Payer: Medicare Other | Attending: Emergency Medicine | Admitting: Emergency Medicine

## 2015-08-15 ENCOUNTER — Encounter (HOSPITAL_COMMUNITY): Payer: Self-pay | Admitting: Emergency Medicine

## 2015-08-15 DIAGNOSIS — M546 Pain in thoracic spine: Secondary | ICD-10-CM | POA: Diagnosis present

## 2015-08-15 DIAGNOSIS — M1990 Unspecified osteoarthritis, unspecified site: Secondary | ICD-10-CM | POA: Diagnosis not present

## 2015-08-15 DIAGNOSIS — Z85830 Personal history of malignant neoplasm of bone: Secondary | ICD-10-CM | POA: Diagnosis not present

## 2015-08-15 DIAGNOSIS — M5440 Lumbago with sciatica, unspecified side: Secondary | ICD-10-CM

## 2015-08-15 DIAGNOSIS — I10 Essential (primary) hypertension: Secondary | ICD-10-CM | POA: Insufficient documentation

## 2015-08-15 DIAGNOSIS — Z79899 Other long term (current) drug therapy: Secondary | ICD-10-CM | POA: Insufficient documentation

## 2015-08-15 DIAGNOSIS — Z8579 Personal history of other malignant neoplasms of lymphoid, hematopoietic and related tissues: Secondary | ICD-10-CM | POA: Diagnosis not present

## 2015-08-15 DIAGNOSIS — L089 Local infection of the skin and subcutaneous tissue, unspecified: Secondary | ICD-10-CM | POA: Diagnosis not present

## 2015-08-15 DIAGNOSIS — J45909 Unspecified asthma, uncomplicated: Secondary | ICD-10-CM | POA: Diagnosis not present

## 2015-08-15 LAB — URINALYSIS, ROUTINE W REFLEX MICROSCOPIC
BILIRUBIN URINE: NEGATIVE
Glucose, UA: NEGATIVE mg/dL
Hgb urine dipstick: NEGATIVE
Ketones, ur: NEGATIVE mg/dL
Leukocytes, UA: NEGATIVE
Nitrite: NEGATIVE
Protein, ur: NEGATIVE mg/dL
SPECIFIC GRAVITY, URINE: 1.016 (ref 1.005–1.030)
UROBILINOGEN UA: 0.2 mg/dL (ref 0.0–1.0)
pH: 6 (ref 5.0–8.0)

## 2015-08-15 LAB — COMPREHENSIVE METABOLIC PANEL
ALK PHOS: 103 U/L (ref 38–126)
ALT: 21 U/L (ref 14–54)
ANION GAP: 9 (ref 5–15)
AST: 20 U/L (ref 15–41)
Albumin: 4 g/dL (ref 3.5–5.0)
BILIRUBIN TOTAL: 0.7 mg/dL (ref 0.3–1.2)
BUN: 9 mg/dL (ref 6–20)
CALCIUM: 9 mg/dL (ref 8.9–10.3)
CO2: 25 mmol/L (ref 22–32)
Chloride: 107 mmol/L (ref 101–111)
Creatinine, Ser: 0.62 mg/dL (ref 0.44–1.00)
GFR calc Af Amer: >60 mL/min (ref >60)
Glucose, Bld: 100 mg/dL — ABNORMAL HIGH (ref 65–99)
POTASSIUM: 3.1 mmol/L — AB (ref 3.5–5.1)
Sodium: 141 mmol/L (ref 135–145)
TOTAL PROTEIN: 7.9 g/dL (ref 6.5–8.1)

## 2015-08-15 LAB — CBC
HEMATOCRIT: 41 % (ref 36.0–46.0)
Hemoglobin: 13.7 g/dL (ref 12.0–15.0)
MCH: 30.5 pg (ref 26.0–34.0)
MCHC: 33.4 g/dL (ref 30.0–36.0)
MCV: 91.3 fL (ref 78.0–100.0)
PLATELETS: 228 10*3/uL (ref 150–400)
RBC: 4.49 MIL/uL (ref 3.87–5.11)
RDW: 14.3 % (ref 11.5–15.5)
WBC: 12.2 10*3/uL — ABNORMAL HIGH (ref 4.0–10.5)

## 2015-08-15 MED ORDER — POTASSIUM CHLORIDE CRYS ER 20 MEQ PO TBCR
40.0000 meq | EXTENDED_RELEASE_TABLET | Freq: Once | ORAL | Status: AC
  Administered 2015-08-15: 40 meq via ORAL
  Filled 2015-08-15: qty 2

## 2015-08-15 MED ORDER — CLINDAMYCIN HCL 300 MG PO CAPS
300.0000 mg | ORAL_CAPSULE | Freq: Once | ORAL | Status: AC
  Administered 2015-08-15: 300 mg via ORAL
  Filled 2015-08-15: qty 1

## 2015-08-15 MED ORDER — HYDROMORPHONE HCL 1 MG/ML IJ SOLN
1.0000 mg | Freq: Once | INTRAMUSCULAR | Status: AC
  Administered 2015-08-15: 1 mg via INTRAVENOUS
  Filled 2015-08-15: qty 1

## 2015-08-15 MED ORDER — HYDROCODONE-ACETAMINOPHEN 5-325 MG PO TABS: 1.0000 | ORAL_TABLET | Freq: Four times a day (QID) | ORAL | Status: DC | PRN

## 2015-08-15 MED ORDER — CLINDAMYCIN HCL 300 MG PO CAPS: ORAL_CAPSULE | ORAL | Status: DC

## 2015-08-15 MED ORDER — ONDANSETRON HCL 4 MG/2ML IJ SOLN
4.0000 mg | Freq: Once | INTRAMUSCULAR | Status: AC
  Administered 2015-08-15: 4 mg via INTRAVENOUS
  Filled 2015-08-15: qty 2

## 2015-08-15 NOTE — ED Notes (Signed)
Pt has hx of bone cancer. Pt c/o worsening back pain x 10 days. Pt denies injury. Pt sts she woke up with the pain. Pt takes Tylenol 3 with Codeine but sts it isn't touching her pain. Pt had treatment last Tuesday at the cancer center with medication that is supposed to make the bones stronger. Per pt, pt receives this treatment every 3 months. Pt also c/o sinus infection and has redness on inside of L nare. Pt c/o diarrhea x 2 days but denies N/V. Pt c/o lower L abdominal pain. Pt A&Ox4.

## 2015-08-15 NOTE — Discharge Instructions (Signed)
Follow up with your family md next week. °

## 2015-08-15 NOTE — ED Provider Notes (Signed)
CSN: [id number]     Arrival date & time 08/15/15  1554 History   First MD Initiated Contact with Patient 08/15/15 1740     Chief Complaint  Patient presents with  . Back Pain  . Cancer     (Consider location/radiation/quality/duration/timing/severity/associated sxs/prior Treatment) Patient is a 69 y.o. female presenting with back pain. The history is provided by the patient (Patient has history of multiple myeloma. She says that she takes Tylenol 3 for back pain. For her multiple myeloma. She states the pain is getting worse. She also complains of a swelling in her left nostril.).  Back Pain Location:  Thoracic spine Quality:  Aching Radiates to:  Does not radiate Pain severity:  Moderate Pain is:  Same all the time Onset quality:  Gradual Timing:  Constant Progression:  Waxing and waning Chronicity:  Recurrent Context: emotional stress   Associated symptoms: no abdominal pain, no chest pain and no headaches     Past Medical History  Diagnosis Date  . Cancer ([place])   . Hypertension   . Asthma   . Arthritis    Past Surgical History  Procedure Laterality Date  . Appendectomy    . Tubal ligation    . Cholecystectomy     No family history on file. Social History  Substance Use Topics  . Smoking status: Never Smoker   . Smokeless tobacco: Never Used  . Alcohol Use: No   OB History    No data available     Review of Systems  Constitutional: Negative for appetite change and fatigue.  HENT: Negative for congestion, ear discharge and sinus pressure.        Pain left nostril with swollen  Eyes: Negative for discharge.  Respiratory: Negative for cough.   Cardiovascular: Negative for chest pain.  Gastrointestinal: Negative for abdominal pain and diarrhea.  Genitourinary: Negative for frequency and hematuria.  Musculoskeletal: Positive for back pain.  Skin: Negative for rash.  Neurological: Negative for seizures and headaches.  Psychiatric/Behavioral: Negative for  hallucinations.      Allergies  Aspirin and Gabapentin  Home Medications   Prior to Admission medications   Medication Sig Start Date End Date Taking? Authorizing Provider  acetaminophen-codeine (TYLENOL #3) 300-30 MG per tablet Take 1-2 tablets by mouth every 8 (eight) hours as needed for moderate pain. 07/01/15   Arnoldo Morale, MD  albuterol (PROVENTIL HFA;VENTOLIN HFA) 108 (90 BASE) MCG/ACT inhaler Inhale 2 puffs into the lungs every 6 (six) hours as needed for wheezing or shortness of breath. 07/06/15   Arnoldo Morale, MD  amLODipine (NORVASC) 10 MG tablet Take 1 tablet (10 mg total) by mouth daily. 05/13/15   Arnoldo Morale, MD  carisoprodol (SOMA) 350 MG tablet Take 1 tablet (350 mg total) by mouth 2 (two) times daily as needed for muscle spasms. 08/03/15   Arnoldo Morale, MD  clindamycin (CLEOCIN) 300 MG capsule One pill qid 08/15/15   Milton Ferguson, MD  hydrochlorothiazide (HYDRODIURIL) 25 MG tablet Take 1 tablet (25 mg total) by mouth daily. 05/06/15   Orlie Dakin, MD  HYDROcodone-acetaminophen (NORCO/VICODIN) 5-325 MG tablet Take 1 tablet by mouth every 6 (six) hours as needed for moderate pain. 08/15/15   Milton Ferguson, MD  naproxen sodium (ANAPROX) 220 MG tablet Take 440 mg by mouth 2 (two) times daily as needed (pain).    Historical Provider, MD  oxybutynin (DITROPAN) 5 MG tablet Take 1 tablet (5 mg total) by mouth 2 (two) times daily. 08/03/15   Arnoldo Morale, MD  oxyCODONE (ROXICODONE) 15 MG immediate release tablet Take 1 tablet (15 mg total) by mouth every 6 (six) hours as needed for pain. Patient not taking: Reported on 08/03/2015 04/11/15   Ernestina Patches, MD  potassium chloride SA (K-DUR,KLOR-CON) 20 MEQ tablet Take 1 tablet (20 mEq total) by mouth daily. Patient not taking: Reported on 08/03/2015 02/08/15   Curt Bears, MD  potassium chloride SA (K-DUR,KLOR-CON) 20 MEQ tablet Take 1 tablet (20 mEq total) by mouth daily. Patient not taking: Reported on 08/03/2015 05/06/15   Orlie Dakin, MD   BP 141/86 mmHg  Pulse 80  Temp(Src) 98.9 F (37.2 C) (Oral)  Resp 16  SpO2 97% Physical Exam  Constitutional: She is oriented to person, place, and time. She appears well-developed.  HENT:  Head: Normocephalic.  Patient has a small skin infection in her left nostril. It is red, tender, swollen  Eyes: Conjunctivae and EOM are normal. No scleral icterus.  Neck: Neck supple. No thyromegaly present.  Cardiovascular: Normal rate and regular rhythm.  Exam reveals no gallop and no friction rub.   No murmur heard. Pulmonary/Chest: No stridor. She has no wheezes. She has no rales. She exhibits no tenderness.  Abdominal: She exhibits no distension. There is no tenderness. There is no rebound.  Musculoskeletal: Normal range of motion. She exhibits no edema.  Alternatives to lumbar spine  Lymphadenopathy:    She has no cervical adenopathy.  Neurological: She is oriented to person, place, and time. She exhibits normal muscle tone. Coordination normal.  Skin: No rash noted. No erythema.  Psychiatric: She has a normal mood and affect. Her behavior is normal.    ED Course  Procedures (including critical care time) Labs Review Labs Reviewed  COMPREHENSIVE METABOLIC PANEL - Abnormal; Notable for the following:    Potassium 3.1 (*)    Glucose, Bld 100 (*)    All other components within normal limits  CBC - Abnormal; Notable for the following:    WBC 12.2 (*)    All other components within normal limits  URINALYSIS, ROUTINE W REFLEX MICROSCOPIC (NOT AT [facility])    Imaging Review No results found. I have personally reviewed and evaluated these images and lab results as part of my medical decision-making.   EKG Interpretation None      MDM   Final diagnoses:  Midline low back pain with sciatica, sciatica laterality unspecified  Skin infection    Labs unremarkable except calcium 3.1. Patient improved with Dilaudid. She will be sent home with Vicodin for her back pain  along with clindamycin for her skin infection under her nose and she'll follow-up with her family doctor in a couple days    Milton Ferguson, MD 08/15/15 1904

## 2015-08-24 ENCOUNTER — Ambulatory Visit: Payer: Medicare Other | Attending: Family Medicine | Admitting: Family Medicine

## 2015-08-24 ENCOUNTER — Encounter: Payer: Self-pay | Admitting: Family Medicine

## 2015-08-24 VITALS — BP 138/83 | HR 100 | Temp 98.8°F | Resp 16 | Ht 60.0 in | Wt 154.0 lb

## 2015-08-24 DIAGNOSIS — I10 Essential (primary) hypertension: Secondary | ICD-10-CM | POA: Diagnosis not present

## 2015-08-24 DIAGNOSIS — C9001 Multiple myeloma in remission: Secondary | ICD-10-CM | POA: Insufficient documentation

## 2015-08-24 DIAGNOSIS — E876 Hypokalemia: Secondary | ICD-10-CM | POA: Diagnosis not present

## 2015-08-24 DIAGNOSIS — G8929 Other chronic pain: Secondary | ICD-10-CM | POA: Diagnosis not present

## 2015-08-24 DIAGNOSIS — N3941 Urge incontinence: Secondary | ICD-10-CM | POA: Diagnosis not present

## 2015-08-24 DIAGNOSIS — M549 Dorsalgia, unspecified: Secondary | ICD-10-CM | POA: Diagnosis not present

## 2015-08-24 LAB — BASIC METABOLIC PANEL
BUN: 9 mg/dL (ref 7–25)
CALCIUM: 8.8 mg/dL (ref 8.6–10.4)
CO2: 28 mmol/L (ref 20–31)
CREATININE: 0.6 mg/dL (ref 0.60–0.93)
Chloride: 104 mmol/L (ref 98–110)
Glucose, Bld: 87 mg/dL (ref 65–99)
Potassium: 3.9 mmol/L (ref 3.5–5.3)
SODIUM: 141 mmol/L (ref 135–146)

## 2015-08-24 MED ORDER — ACETAMINOPHEN-CODEINE #3 300-30 MG PO TABS: 1.0000 | ORAL_TABLET | Freq: Three times a day (TID) | ORAL | Status: DC | PRN

## 2015-08-24 MED ORDER — CARISOPRODOL 350 MG PO TABS: 350.0000 mg | ORAL_TABLET | Freq: Two times a day (BID) | ORAL | Status: DC | PRN

## 2015-08-24 NOTE — Patient Instructions (Signed)

## 2015-08-24 NOTE — Progress Notes (Signed)
Subjective:    Patient ID: Ruth Sanchez, female    DOB: [birth year], 69 y.o.   MRN: [id number]  HPI Richa Shor is a 69 year old female patient with a history of multiple myeloma in remission status post stem cell transplant in 2004, hypertension, low back pain who was recently seen at [facility] ED on 08/15/15 for back pain after she had run out of her Tylenol with Codeine and had lost Soma her prescription  She received 20 tablets of Percocet which helped her symptoms and she is requesting some of that. She also had a low potassium of 3.1 which was replaced at the ED visit. Her left nostril also had a tender growth which was diagnosed as a skin infection and she was placed on clindamycin which she is currently taking. I had previously referred her to the pain clinic which she had refused to go to because she wanted to stay here at [facility] . She suffers from chronic back pain and leg pain which feels like "it is in her bones"  Remains on Zometa for multiple myeloma.  Past Medical History  Diagnosis Date  . Cancer ([place])   . Hypertension   . Asthma   . Arthritis     Past Surgical History  Procedure Laterality Date  . Appendectomy    . Tubal ligation    . Cholecystectomy      Social History   Social History  . Marital Status: Married    Spouse Name: N/A  . Number of Children: N/A  . Years of Education: N/A   Occupational History  . Not on file.   Social History Main Topics  . Smoking status: Never Smoker   . Smokeless tobacco: Never Used  . Alcohol Use: No  . Drug Use: No  . Sexual Activity: Not Currently   Other Topics Concern  . Not on file   Social History Narrative    Allergies  Allergen Reactions  . Aspirin     Itching and rash  . Gabapentin Swelling    Current Outpatient Prescriptions on File Prior to Visit  Medication Sig Dispense Refill  . acetaminophen-codeine (TYLENOL #3) 300-30 MG per tablet Take 1-2 tablets by mouth every 8 (eight) hours as  needed for moderate pain. 90 tablet 1  . albuterol (PROVENTIL HFA;VENTOLIN HFA) 108 (90 BASE) MCG/ACT inhaler Inhale 2 puffs into the lungs every 6 (six) hours as needed for wheezing or shortness of breath. 1 Inhaler 0  . amLODipine (NORVASC) 10 MG tablet Take 1 tablet (10 mg total) by mouth daily. 90 tablet 3  . carisoprodol (SOMA) 350 MG tablet Take 1 tablet (350 mg total) by mouth 2 (two) times daily as needed for muscle spasms. 50 tablet 1  . clindamycin (CLEOCIN) 300 MG capsule One pill qid 40 capsule 0  . hydrochlorothiazide (HYDRODIURIL) 25 MG tablet Take 1 tablet (25 mg total) by mouth daily. 30 tablet 0  . HYDROcodone-acetaminophen (NORCO/VICODIN) 5-325 MG tablet Take 1 tablet by mouth every 6 (six) hours as needed for moderate pain. 20 tablet 0  . naproxen sodium (ANAPROX) 220 MG tablet Take 440 mg by mouth 2 (two) times daily as needed (pain).    Marland Kitchen oxybutynin (DITROPAN) 5 MG tablet Take 1 tablet (5 mg total) by mouth 2 (two) times daily. (Patient not taking: Reported on 08/24/2015) 60 tablet 2  . oxyCODONE (ROXICODONE) 15 MG immediate release tablet Take 1 tablet (15 mg total) by mouth every 6 (six) hours as needed for  pain. (Patient not taking: Reported on 08/03/2015) 20 tablet 0  . potassium chloride SA (K-DUR,KLOR-CON) 20 MEQ tablet Take 1 tablet (20 mEq total) by mouth daily. (Patient not taking: Reported on 08/03/2015) 7 tablet 0  . potassium chloride SA (K-DUR,KLOR-CON) 20 MEQ tablet Take 1 tablet (20 mEq total) by mouth daily. (Patient not taking: Reported on 08/03/2015) 30 tablet 0   No current facility-administered medications on file prior to visit.       Review of Systems Constitutional: Negative for fever, chills, diaphoresis, activity change, appetite change and fatigue. HENT: Negative for ear pain, nosebleeds, congestion, facial swelling, rhinorrhea, neck pain, neck stiffness and ear discharge; positive for left nostril growth which is decreasing in size. Eyes: Negative  for pain, discharge, redness, itching and visual disturbance. Respiratory: Negative for cough, choking, chest tightness, shortness of breath, wheezing and stridor.  Cardiovascular: Negative for chest pain, palpitations and leg swelling. Gastrointestinal: Negative for abdominal distention. Genitourinary: Urinary incontinence has improved since initiation of oxybutynin Musculoskeletal: positive for back pain Neurological: Negative for dizziness, tremors, seizures, syncope, facial asymmetry, speech difficulty, weakness, light-headedness, numbness in left leg Hematological: Negative for adenopathy. Does not bruise/bleed easily. Skin: Negative for rash, ulcer. Psychiatric/Behavioral: Negative for hallucinations, behavioral problems, confusion, dysphoric mood, decreased concentration and agitation.         Objective: Filed Vitals:   08/24/15 1101 08/24/15 1106  BP:  138/83  Pulse:  100  Temp:  98.8 F (37.1 C)  TempSrc:  Oral  Resp:  16  Height: 5' (1.524 m)   Weight: 154 lb (69.854 kg)   SpO2:  97%      Physical Exam Constitutional: Patient appears well-developed and well-nourished. No distress. Nose: Left nare with small boil with no discharge noted, right nare is normal. Neck: Normal ROM, No JVD. No tracheal deviation. No thyromegaly. CVS: tachycardic, regular rhythm, S1/S2 +, no murmurs, no gallops, no carotid bruit.  Pulmonary: Effort and breath sounds normal, no stridor, rhonchi, wheezes, rales.  Abdominal: Soft. BS +, no distension, tenderness, rebound or guarding.  Musculoskeletal: Generalized lumbar spine tenderness; positive straight leg raise on the left Lymphadenopathy: No lymphadenopathy noted, cervical, inguinal or axillary Neuro: Alert. Normal reflexes, muscle tone coordination. No cranial nerve deficit. Skin: Skin is warm and dry. No rash noted. Not diaphoretic. No erythema. No pallor. Psychiatric: Normal mood and affect. Behavior, judgment, thought content  normal.          Assessment & Plan:  69 year old female with a history of Multiple Myeloma in remission,  Hypertension here for a follow up visit.   Multiple myeloma: Continue Zometa infusion as per protocol.  Chronic back pain Unable to tolerate gabapentin Refuses to go to the pain clinic and so I have refilled Tylenol 3 and Soma for her and will only refill that in a month's time. High risk for falls; advised to use walker and four pronged cane interchangeably as needed  Hypertension: Controlled. Continue Amlodipine, Basic metabolic panel today Low-sodium, DASH diet advised.  Urinary incontinence: Continue Oxybutinin.  Hypokalemia: Basic metabolic panel today and is still hypokalemic I will place on potassium supplements.  This note has been created with Surveyor, quantity. Any transcriptional errors are unintentional.

## 2015-08-24 NOTE — Progress Notes (Signed)
Pt's here for ED f/up midline low back pain.Rated pain at 10/10.  Pt states pain in lower back as sore. Pt reports muscle spasms.

## 2015-08-29 ENCOUNTER — Telehealth: Payer: Self-pay

## 2015-08-29 NOTE — Telephone Encounter (Signed)
-----   Message from Arnoldo Morale, MD sent at 08/25/2015  1:56 PM EDT ----- Please inform the patient that labs are normal. Thank you.

## 2015-09-01 NOTE — Telephone Encounter (Signed)
CMA called patient, patient was not available. However, her husband Glenora Warhurst verified patients name and DOB, and was able to take the message. Mr Gregory verbalized that he understood and would give his wife her lab results.

## 2015-09-05 ENCOUNTER — Ambulatory Visit: Payer: Medicare Other | Admitting: Family Medicine

## 2015-09-23 ENCOUNTER — Encounter: Payer: Self-pay | Admitting: Family Medicine

## 2015-09-23 ENCOUNTER — Ambulatory Visit: Payer: Medicare Other | Attending: Family Medicine | Admitting: Family Medicine

## 2015-09-23 VITALS — BP 143/92 | HR 99 | Temp 99.4°F | Resp 16 | Ht 61.0 in | Wt 152.0 lb

## 2015-09-23 DIAGNOSIS — Z886 Allergy status to analgesic agent status: Secondary | ICD-10-CM | POA: Diagnosis not present

## 2015-09-23 DIAGNOSIS — R197 Diarrhea, unspecified: Secondary | ICD-10-CM

## 2015-09-23 DIAGNOSIS — C9000 Multiple myeloma not having achieved remission: Secondary | ICD-10-CM | POA: Insufficient documentation

## 2015-09-23 DIAGNOSIS — R32 Unspecified urinary incontinence: Secondary | ICD-10-CM | POA: Insufficient documentation

## 2015-09-23 DIAGNOSIS — I10 Essential (primary) hypertension: Secondary | ICD-10-CM | POA: Diagnosis not present

## 2015-09-23 DIAGNOSIS — G8929 Other chronic pain: Secondary | ICD-10-CM | POA: Diagnosis not present

## 2015-09-23 DIAGNOSIS — M549 Dorsalgia, unspecified: Secondary | ICD-10-CM

## 2015-09-23 DIAGNOSIS — C9001 Multiple myeloma in remission: Secondary | ICD-10-CM | POA: Diagnosis not present

## 2015-09-23 DIAGNOSIS — J45909 Unspecified asthma, uncomplicated: Secondary | ICD-10-CM | POA: Diagnosis not present

## 2015-09-23 DIAGNOSIS — Z888 Allergy status to other drugs, medicaments and biological substances status: Secondary | ICD-10-CM | POA: Insufficient documentation

## 2015-09-23 MED ORDER — ACETAMINOPHEN-CODEINE #3 300-30 MG PO TABS: 1.0000 | ORAL_TABLET | Freq: Three times a day (TID) | ORAL | Status: DC | PRN

## 2015-09-23 MED ORDER — AMLODIPINE BESYLATE 10 MG PO TABS: 10.0000 mg | ORAL_TABLET | Freq: Every day | ORAL | Status: DC

## 2015-09-23 MED ORDER — CARISOPRODOL 350 MG PO TABS: 350.0000 mg | ORAL_TABLET | Freq: Two times a day (BID) | ORAL | Status: DC | PRN

## 2015-09-23 MED ORDER — DIPHENOXYLATE-ATROPINE 2.5-0.025 MG PO TABS: 1.0000 | ORAL_TABLET | Freq: Four times a day (QID) | ORAL | Status: DC | PRN

## 2015-09-23 NOTE — Progress Notes (Signed)
Subjective:    Patient ID: Ruth Sanchez, female    DOB: Mar 19, 1946, 69 y.o.   MRN: [id number]  HPI 69 year old female with a history of Multiple Myeloma ,Hypertension here for a follow up visit. She complains of running out of her Tylenol No. 3 and has had to take the Soma 3 times rather than 2 times daily as she is in so much pain which is worse on the left side of her back She continues to refuse to go to see a pain management. Her appointment with her oncologist comes up next month.  Past Medical History  Diagnosis Date  . Cancer ([place])   . Hypertension   . Asthma   . Arthritis     Past Surgical History  Procedure Laterality Date  . Appendectomy    . Tubal ligation    . Cholecystectomy      Social History   Social History  . Marital Status: Married    Spouse Name: N/A  . Number of Children: N/A  . Years of Education: N/A   Occupational History  . Not on file.   Social History Main Topics  . Smoking status: Never Smoker   . Smokeless tobacco: Never Used  . Alcohol Use: No  . Drug Use: No  . Sexual Activity: Not Currently   Other Topics Concern  . Not on file   Social History Narrative    Allergies  Allergen Reactions  . Aspirin     Itching and rash  . Gabapentin Swelling    Current Outpatient Prescriptions on File Prior to Visit  Medication Sig Dispense Refill  . albuterol (PROVENTIL HFA;VENTOLIN HFA) 108 (90 BASE) MCG/ACT inhaler Inhale 2 puffs into the lungs every 6 (six) hours as needed for wheezing or shortness of breath. 1 Inhaler 0  . HYDROcodone-acetaminophen (NORCO/VICODIN) 5-325 MG tablet Take 1 tablet by mouth every 6 (six) hours as needed for moderate pain. 20 tablet 0  . naproxen sodium (ANAPROX) 220 MG tablet Take 440 mg by mouth 2 (two) times daily as needed (pain).    Marland Kitchen oxybutynin (DITROPAN) 5 MG tablet Take 1 tablet (5 mg total) by mouth 2 (two) times daily. (Patient not taking: Reported on 08/24/2015) 60 tablet 2  . potassium chloride  SA (K-DUR,KLOR-CON) 20 MEQ tablet Take 1 tablet (20 mEq total) by mouth daily. (Patient not taking: Reported on 08/03/2015) 7 tablet 0  . potassium chloride SA (K-DUR,KLOR-CON) 20 MEQ tablet Take 1 tablet (20 mEq total) by mouth daily. (Patient not taking: Reported on 08/03/2015) 30 tablet 0   No current facility-administered medications on file prior to visit.      Review of Systems  Constitutional: Negative for activity change and appetite change.  HENT: Negative for sinus pressure and sore throat.   Respiratory: Negative for chest tightness, shortness of breath and wheezing.   Gastrointestinal: Negative for abdominal pain, constipation and abdominal distention.  Genitourinary: Negative.   Musculoskeletal:       See history of present illness  Psychiatric/Behavioral: Negative for behavioral problems and dysphoric mood.       Objective: Filed Vitals:   09/23/15 1126  BP: 143/92  Pulse: 99  Temp: 99.4 F (37.4 C)  TempSrc: Oral  Resp: 16  Height: _0  (1.549 m)  Weight: 152 lb (68.947 kg)  SpO2: 98%      Physical Exam  Constitutional: She is oriented to person, place, and time. She appears well-developed and well-nourished.  In acute pain  Cardiovascular: Normal rate, normal heart sounds and intact distal pulses.   No murmur heard. Pulmonary/Chest: Effort normal and breath sounds normal. She has no wheezes. She has no rales. She exhibits no tenderness.  Abdominal: Soft. Bowel sounds are normal. She exhibits no distension and no mass. There is no tenderness.  Musculoskeletal:  Tenderness on palpation of left side of the lumbar region  Neurological: She is alert and oriented to person, place, and time.          Assessment & Plan:  69 year old female with a history of Multiple Myeloma , Hypertension here for a follow up visit.   Multiple myeloma: Continue Zometa infusion as per protocol. Keep appointment with oncology next month.  Chronic back pain Unable to  tolerate gabapentin Refuses to go to the pain clinic and so I have refilled Tylenol 3 and Soma for her and will only refill that in a month's time. High risk for falls; advised to use walker and four pronged cane interchangeably as needed  Hypertension: Controlled. Continue Amlodipine,  Urinary incontinence: Continue Oxybutinin.  This note has been created with Surveyor, quantity. Any transcriptional errors are unintentional.

## 2015-09-23 NOTE — Patient Instructions (Signed)
Hypertension Hypertension, commonly called high blood pressure, is when the force of blood pumping through your arteries is too strong. Your arteries are the blood vessels that carry blood from your heart throughout your body. A blood pressure reading consists of a higher number over a lower number, such as 110/72. The higher number (systolic) is the pressure inside your arteries when your heart pumps. The lower number (diastolic) is the pressure inside your arteries when your heart relaxes. Ideally you want your blood pressure below 120/80. Hypertension forces your heart to work harder to pump blood. Your arteries may become narrow or stiff. Having untreated or uncontrolled hypertension can cause heart attack, stroke, kidney disease, and other problems. RISK FACTORS Some risk factors for high blood pressure are controllable. Others are not.  Risk factors you cannot control include:   Race. You may be at higher risk if you are African American.  Age. Risk increases with age.  Gender. Men are at higher risk than women before age 45 years. After age 65, women are at higher risk than men. Risk factors you can control include:  Not getting enough exercise or physical activity.  Being overweight.  Getting too much fat, sugar, calories, or salt in your diet.  Drinking too much alcohol. SIGNS AND SYMPTOMS Hypertension does not usually cause signs or symptoms. Extremely high blood pressure (hypertensive crisis) may cause headache, anxiety, shortness of breath, and nosebleed. DIAGNOSIS To check if you have hypertension, your health care provider will measure your blood pressure while you are seated, with your arm held at the level of your heart. It should be measured at least twice using the same arm. Certain conditions can cause a difference in blood pressure between your right and left arms. A blood pressure reading that is higher than normal on one occasion does not mean that you need treatment. If  it is not clear whether you have high blood pressure, you may be asked to return on a different day to have your blood pressure checked again. Or, you may be asked to monitor your blood pressure at home for 1 or more weeks. TREATMENT Treating high blood pressure includes making lifestyle changes and possibly taking medicine. Living a healthy lifestyle can help lower high blood pressure. You may need to change some of your habits. Lifestyle changes may include:  Following the DASH diet. This diet is high in fruits, vegetables, and whole grains. It is low in salt, red meat, and added sugars.  Keep your sodium intake below 2,300 mg per day.  Getting at least 30-45 minutes of aerobic exercise at least 4 times per week.  Losing weight if necessary.  Not smoking.  Limiting alcoholic beverages.  Learning ways to reduce stress. Your health care provider may prescribe medicine if lifestyle changes are not enough to get your blood pressure under control, and if one of the following is true:  You are 18-59 years of age and your systolic blood pressure is above 140.  You are 60 years of age or older, and your systolic blood pressure is above 150.  Your diastolic blood pressure is above 90.  You have diabetes, and your systolic blood pressure is over 140 or your diastolic blood pressure is over 90.  You have kidney disease and your blood pressure is above 140/90.  You have heart disease and your blood pressure is above 140/90. Your personal target blood pressure may vary depending on your medical conditions, your age, and other factors. HOME CARE INSTRUCTIONS    Have your blood pressure rechecked as directed by your health care provider.   Take medicines only as directed by your health care provider. Follow the directions carefully. Blood pressure medicines must be taken as prescribed. The medicine does not work as well when you skip doses. Skipping doses also puts you at risk for  problems.  Do not smoke.   Monitor your blood pressure at home as directed by your health care provider. SEEK MEDICAL CARE IF:   You think you are having a reaction to medicines taken.  You have recurrent headaches or feel dizzy.  You have swelling in your ankles.  You have trouble with your vision. SEEK IMMEDIATE MEDICAL CARE IF:  You develop a severe headache or confusion.  You have unusual weakness, numbness, or feel faint.  You have severe chest or abdominal pain.  You vomit repeatedly.  You have trouble breathing. MAKE SURE YOU:   Understand these instructions.  Will watch your condition.  Will get help right away if you are not doing well or get worse.   This information is not intended to replace advice given to you by your health care provider. Make sure you discuss any questions you have with your health care provider.   Document Released: 10/08/2005 Document Revised: 02/22/2015 Document Reviewed: 07/31/2013 Elsevier Interactive Patient Education 2016 [employer]  

## 2015-09-23 NOTE — Progress Notes (Signed)
Patient here for f/up back pain rated at 10+ described as throbbing, sharp pain.  Pt requesting refill on medication. Pt reports taking meds.

## 2015-10-26 ENCOUNTER — Emergency Department (HOSPITAL_COMMUNITY)
Admission: EM | Admit: 2015-10-26 | Discharge: 2015-10-26 | Disposition: A | Discharge status: Home / Self Care | Payer: Medicare Other | Attending: Emergency Medicine | Admitting: Emergency Medicine

## 2015-10-26 ENCOUNTER — Encounter (HOSPITAL_COMMUNITY): Payer: Self-pay | Admitting: Emergency Medicine

## 2015-10-26 DIAGNOSIS — M545 Low back pain: Secondary | ICD-10-CM | POA: Diagnosis not present

## 2015-10-26 DIAGNOSIS — Z859 Personal history of malignant neoplasm, unspecified: Secondary | ICD-10-CM | POA: Diagnosis not present

## 2015-10-26 DIAGNOSIS — M1990 Unspecified osteoarthritis, unspecified site: Secondary | ICD-10-CM | POA: Insufficient documentation

## 2015-10-26 DIAGNOSIS — G8929 Other chronic pain: Secondary | ICD-10-CM | POA: Insufficient documentation

## 2015-10-26 DIAGNOSIS — Z79899 Other long term (current) drug therapy: Secondary | ICD-10-CM | POA: Insufficient documentation

## 2015-10-26 DIAGNOSIS — I10 Essential (primary) hypertension: Secondary | ICD-10-CM | POA: Insufficient documentation

## 2015-10-26 DIAGNOSIS — M25552 Pain in left hip: Secondary | ICD-10-CM | POA: Diagnosis not present

## 2015-10-26 DIAGNOSIS — R1030 Lower abdominal pain, unspecified: Secondary | ICD-10-CM | POA: Diagnosis not present

## 2015-10-26 DIAGNOSIS — J45909 Unspecified asthma, uncomplicated: Secondary | ICD-10-CM | POA: Diagnosis not present

## 2015-10-26 MED ORDER — HYDROCODONE-ACETAMINOPHEN 5-325 MG PO TABS: 1.0000 | ORAL_TABLET | Freq: Four times a day (QID) | ORAL | Status: DC | PRN

## 2015-10-26 NOTE — Discharge Instructions (Signed)

## 2015-10-26 NOTE — ED Provider Notes (Signed)
CSN: [id number]     Arrival date & time 10/26/15  1054 History   First MD Initiated Contact with Patient 10/26/15 [phone]     Chief Complaint  Patient presents with  . Cancer pain    HPI    70 year old female with a history of multiple myeloma presents today with chronic pain. Patient was diagnosed in October 2008, status post radiation, systemic chemotherapy, stem cell transplant. She is being followed by Dr. Mckinley Sanchez, with the most recent visit on 05/17/2015. Patient has been in remission, denies any acute changes in her chronic baseline pain. She denies any significant weight loss or night sweats, chest pain, shortness of breath, cough or hemoptysis. She denies any lower extremity swelling or edema. She states that pain is chronic, described as sore over her left hip and lower back. She notes that she saw her primary care provider in November of this year was given Tylenol 3's as she reports the patient that she is unable to prescribe anything stronger. Per chart review primary care has urged patient to see pain management, patient reports that she initially did not want to go to pain management, but the time of evaluation reports that she will be needing pain management. Patient has a prescription from previous ED provider of hydrocodone which she states significantly improved her symptoms with no significant side effects. Follow-up with Dr. Mckinley Sanchez on January 25. Patient also reports that her blood pressure has been intermittently elevated, states that primary care "gave her the wrong blood pressure medication", and she does not want to take blood pressure medication. She denies any headache, neurological deficits, chest pain, changes in color clarity characteristics of her urine    Past Medical History  Diagnosis Date  . Cancer ([place])   . Hypertension   . Asthma   . Arthritis    Past Surgical History  Procedure Laterality Date  . Appendectomy    . Tubal ligation    . Cholecystectomy      History reviewed. No pertinent family history. Social History  Substance Use Topics  . Smoking status: Never Smoker   . Smokeless tobacco: Never Used  . Alcohol Use: No   OB History    No data available     Review of Systems  All other systems reviewed and are negative.   Allergies  Aspirin and Gabapentin  Home Medications   Prior to Admission medications   Medication Sig Start Date End Date Taking? Authorizing Provider  acetaminophen-codeine (TYLENOL #3) 300-30 MG tablet Take 1-2 tablets by mouth every 8 (eight) hours as needed for moderate pain. 09/23/15  Yes Ruth Morale, MD  carisoprodol (SOMA) 350 MG tablet Take 1 tablet (350 mg total) by mouth 2 (two) times daily as needed for muscle spasms. 09/23/15  Yes Ruth Morale, MD  albuterol (PROVENTIL HFA;VENTOLIN HFA) 108 (90 BASE) MCG/ACT inhaler Inhale 2 puffs into the lungs every 6 (six) hours as needed for wheezing or shortness of breath. 07/06/15   Ruth Morale, MD  amLODipine (NORVASC) 10 MG tablet Take 1 tablet (10 mg total) by mouth daily. 09/23/15   Ruth Morale, MD  diphenoxylate-atropine (LOMOTIL) 2.5-0.025 MG tablet Take 1 tablet by mouth 4 (four) times daily as needed for diarrhea or loose stools. 09/23/15   Ruth Morale, MD  HYDROcodone-acetaminophen (NORCO/VICODIN) 5-325 MG tablet Take 1 tablet by mouth every 6 (six) hours as needed for moderate pain. 10/26/15   Ruth Regal, PA-C  oxybutynin (DITROPAN) 5 MG tablet Take 1 tablet (5 mg total)  by mouth 2 (two) times daily. Patient not taking: Reported on 08/24/2015 08/03/15   Ruth Morale, MD  potassium chloride SA (K-DUR,KLOR-CON) 20 MEQ tablet Take 1 tablet (20 mEq total) by mouth daily. Patient not taking: Reported on 08/03/2015 02/08/15   Ruth Bears, MD  potassium chloride SA (K-DUR,KLOR-CON) 20 MEQ tablet Take 1 tablet (20 mEq total) by mouth daily. Patient not taking: Reported on 08/03/2015 05/06/15   Ruth Dakin, MD   BP 134/122 mmHg  Pulse 86  Temp(Src) 99  F (37.2 C) (Oral)  SpO2 97%   Physical Exam  Constitutional: She is oriented to person, place, and time. She appears well-developed and well-nourished.  HENT:  Head: Normocephalic and atraumatic.  Eyes: Conjunctivae are normal. Pupils are equal, round, and reactive to light. Right eye exhibits no discharge. Left eye exhibits no discharge. No scleral icterus.  Neck: Normal range of motion. No JVD present. No tracheal deviation present.  Pulmonary/Chest: Effort normal. No stridor.  Musculoskeletal:  Tenderness to palpation of the left lateral lower lumbar region and left hip, no obvious signs of deformity, warmth, redness, decreased range of motion of the back or hips. Full sensation strength and motor function is intact, 5 out of 5 strength, patellar reflexes 2+  Neurological: She is alert and oriented to person, place, and time. Coordination normal.  Psychiatric: She has a normal mood and affect. Her behavior is normal. Judgment and thought content normal.  Nursing note and vitals reviewed.   ED Course  Procedures (including critical care time) Labs Review Labs Reviewed - No data to display  Imaging Review No results found. I have personally reviewed and evaluated these images and lab results as part of my medical decision-making.   EKG Interpretation None      MDM   Final diagnoses:  Chronic pain    Labs:  Imaging:  Consults:  Therapeutics:  Discharge Meds: Hydrocodone  Assessment/Plan: [name] is a 70 year old female with history of cancer. She has chronic pain, no significant changes, no concern for infectious or recurrence of active cancer. Not currently adequately managed with Tylenol 3 at home per patient. Patient likely needs pain management, she has agreed to follow-up with pain management resources. She will be given a short course of her chronic pain medication, she is instructed to avoid driving, or dangerous activities while taking as this could  likely cause significant side effects. Patient reports she's taken this medication before without any significant difficulties. Patient also had complaints of hypertension, she has no signs of end organ damage, not currently taking her medication as prescribed by primary care provider. She is encouraged to take this medication, if she feels that this is incorrect medication she is encouraged to follow up with primary care for reevaluation. Patient was given strict return precautions, verbalized understanding and agreement to today's plan had no further questions or concerns at the time of discharge.        Ruth Regal, PA-C 10/26/15 1600  Charlesetta Shanks, MD 10/27/15 [phone]

## 2015-10-26 NOTE — ED Notes (Signed)
Pt states she's run out of her pain medication for her bone cancer, and she's had a place on her left side that has been hurting her for the last ten days. States she's been having normal bowel movements, no urinary complaints. No new complaints

## 2015-10-28 ENCOUNTER — Ambulatory Visit: Payer: Medicare Other | Attending: Family Medicine | Admitting: Family Medicine

## 2015-10-28 ENCOUNTER — Encounter: Payer: Self-pay | Admitting: Family Medicine

## 2015-10-28 VITALS — BP 144/83 | HR 97 | Temp 98.5°F | Resp 13 | Ht 61.0 in | Wt 157.6 lb

## 2015-10-28 DIAGNOSIS — C9001 Multiple myeloma in remission: Secondary | ICD-10-CM

## 2015-10-28 DIAGNOSIS — J4520 Mild intermittent asthma, uncomplicated: Secondary | ICD-10-CM | POA: Diagnosis not present

## 2015-10-28 DIAGNOSIS — J45909 Unspecified asthma, uncomplicated: Secondary | ICD-10-CM | POA: Insufficient documentation

## 2015-10-28 DIAGNOSIS — M1990 Unspecified osteoarthritis, unspecified site: Secondary | ICD-10-CM | POA: Diagnosis not present

## 2015-10-28 DIAGNOSIS — Z888 Allergy status to other drugs, medicaments and biological substances status: Secondary | ICD-10-CM | POA: Insufficient documentation

## 2015-10-28 DIAGNOSIS — Z79899 Other long term (current) drug therapy: Secondary | ICD-10-CM | POA: Insufficient documentation

## 2015-10-28 DIAGNOSIS — G8929 Other chronic pain: Secondary | ICD-10-CM | POA: Diagnosis not present

## 2015-10-28 DIAGNOSIS — M549 Dorsalgia, unspecified: Secondary | ICD-10-CM | POA: Insufficient documentation

## 2015-10-28 DIAGNOSIS — I10 Essential (primary) hypertension: Secondary | ICD-10-CM | POA: Insufficient documentation

## 2015-10-28 DIAGNOSIS — R32 Unspecified urinary incontinence: Secondary | ICD-10-CM | POA: Diagnosis not present

## 2015-10-28 DIAGNOSIS — C9000 Multiple myeloma not having achieved remission: Secondary | ICD-10-CM | POA: Diagnosis not present

## 2015-10-28 DIAGNOSIS — Z859 Personal history of malignant neoplasm, unspecified: Secondary | ICD-10-CM | POA: Diagnosis not present

## 2015-10-28 DIAGNOSIS — J45901 Unspecified asthma with (acute) exacerbation: Secondary | ICD-10-CM

## 2015-10-28 DIAGNOSIS — Z9889 Other specified postprocedural states: Secondary | ICD-10-CM | POA: Diagnosis not present

## 2015-10-28 MED ORDER — OXYCODONE HCL 5 MG PO TABS: 5.0000 mg | ORAL_TABLET | Freq: Two times a day (BID) | ORAL | Status: DC | PRN

## 2015-10-28 MED ORDER — CARISOPRODOL 350 MG PO TABS: 350.0000 mg | ORAL_TABLET | Freq: Two times a day (BID) | ORAL | Status: DC | PRN

## 2015-10-28 NOTE — Progress Notes (Signed)
Subjective:    Patient ID: Ruth Sanchez, female    DOB: [birth year], 70 y.o.   MRN: [id number]  HPI 70 year old female with a history of Multiple Myeloma ,Hypertension here for a follow up visit. She complains of running out of her Tylenol No. 3 and had to present to the emergency room where she received #20 Hydrocodone pills which did not help much. The Tylenol #3 she states doesn't help much either but is better than nothing  She refused to see pain management in the past; complains she is on a fixed income and will have to select which medications she picks up from the pharmacy. Initially requested refill on albuterol but then changed her mind.  Continues to see Oncology  Past Medical History  Diagnosis Date  . Cancer ([place])   . Hypertension   . Asthma   . Arthritis     Past Surgical History  Procedure Laterality Date  . Appendectomy    . Tubal ligation    . Cholecystectomy      Social History   Social History  . Marital Status: Married    Spouse Name: N/A  . Number of Children: N/A  . Years of Education: N/A   Occupational History  . Not on file.   Social History Main Topics  . Smoking status: Never Smoker   . Smokeless tobacco: Never Used  . Alcohol Use: No  . Drug Use: No  . Sexual Activity: Not Currently   Other Topics Concern  . Not on file   Social History Narrative    Allergies  Allergen Reactions  . Aspirin     Itching and rash  . Gabapentin Swelling    Current Outpatient Prescriptions on File Prior to Visit  Medication Sig Dispense Refill  . albuterol (PROVENTIL HFA;VENTOLIN HFA) 108 (90 BASE) MCG/ACT inhaler Inhale 2 puffs into the lungs every 6 (six) hours as needed for wheezing or shortness of breath. 1 Inhaler 0  . diphenoxylate-atropine (LOMOTIL) 2.5-0.025 MG tablet Take 1 tablet by mouth 4 (four) times daily as needed for diarrhea or loose stools. 30 tablet 1  . HYDROcodone-acetaminophen (NORCO/VICODIN) 5-325 MG tablet Take 1 tablet  by mouth every 6 (six) hours as needed for moderate pain. 20 tablet 0  . amLODipine (NORVASC) 10 MG tablet Take 1 tablet (10 mg total) by mouth daily. (Patient not taking: Reported on 10/28/2015) 30 tablet 3  . potassium chloride SA (K-DUR,KLOR-CON) 20 MEQ tablet Take 1 tablet (20 mEq total) by mouth daily. (Patient not taking: Reported on 08/03/2015) 7 tablet 0  . potassium chloride SA (K-DUR,KLOR-CON) 20 MEQ tablet Take 1 tablet (20 mEq total) by mouth daily. (Patient not taking: Reported on 08/03/2015) 30 tablet 0   No current facility-administered medications on file prior to visit.      Review of Systems Constitutional: Negative for activity change and appetite change.  HENT: Negative for sinus pressure and sore throat.   Respiratory: Negative for chest tightness, shortness of breath and wheezing.   Gastrointestinal: Negative for abdominal pain, constipation and abdominal distention.  Genitourinary: Negative.   Musculoskeletal:       See history of present illness  Psychiatric/Behavioral: Negative for behavioral problems and dysphoric mood.      Objective: Filed Vitals:   10/28/15 1104  BP: 144/83  Pulse: 97  Temp: 98.5 F (36.9 C)  Resp: 13  Height: _0  (1.549 m)  Weight: 157 lb 9.6 oz (71.487 kg)  SpO2: 97%  Physical Exam Constitutional: She is oriented to person, place, and time, teary eyed and in acute pain  Cardiovascular: Normal rate, normal heart sounds and intact distal pulses.   No murmur heard. Pulmonary/Chest: Effort normal and breath sounds normal. She has no wheezes. She has no rales. She exhibits no tenderness.  Abdominal: Soft. Bowel sounds are normal. She exhibits no distension and no mass. There is no tenderness.  Musculoskeletal:  Tenderness on palpation of left side of the lumbar region  Neurological: She is alert and oriented to person, place, and time.         Assessment & Plan:  70 year old female with a history of Multiple Myeloma ,  Hypertension here for a follow up visit.  Multiple myeloma: Continue Zometa infusion as per protocol. Keep appointment with oncology  Chronic back pain Unable to tolerate gabapentin  I will place her on Oxycodone and have explained to her that she will need to come in to the clinic every month to receive refills Refuses to go to the pain clinic. High risk for falls; advised to use walker and four pronged cane interchangeably as needed  Hypertension: Mildly elevated due to pain. Continue Amlodipine,  Urinary incontinence: She states symptoms have improved and she would like to get off Oxybutnin   Asthma: Mild Intermittent Controlled on Albuterol MDI prn

## 2015-10-28 NOTE — Progress Notes (Signed)
Patient here to follow up on her back pain She would like referral to pain clinic She has been frequenting the ED for chronic pain issues and states she cannot afford that She has 2 hydrocodone pills left that ED gave her on 10/26/15

## 2015-11-09 ENCOUNTER — Other Ambulatory Visit (HOSPITAL_BASED_OUTPATIENT_CLINIC_OR_DEPARTMENT_OTHER): Payer: Medicare Other

## 2015-11-09 DIAGNOSIS — M549 Dorsalgia, unspecified: Secondary | ICD-10-CM

## 2015-11-09 DIAGNOSIS — C9001 Multiple myeloma in remission: Secondary | ICD-10-CM

## 2015-11-09 DIAGNOSIS — G8929 Other chronic pain: Secondary | ICD-10-CM | POA: Diagnosis not present

## 2015-11-09 DIAGNOSIS — C9000 Multiple myeloma not having achieved remission: Secondary | ICD-10-CM | POA: Diagnosis present

## 2015-11-09 LAB — COMPREHENSIVE METABOLIC PANEL
ALBUMIN: 3.6 g/dL (ref 3.5–5.0)
ALK PHOS: 114 U/L (ref 40–150)
ALT: 13 U/L (ref 0–55)
AST: 15 U/L (ref 5–34)
Anion Gap: 9 mEq/L (ref 3–11)
BUN: 12.9 mg/dL (ref 7.0–26.0)
CHLORIDE: 107 meq/L (ref 98–109)
CO2: 26 meq/L (ref 22–29)
Calcium: 9 mg/dL (ref 8.4–10.4)
Creatinine: 0.8 mg/dL (ref 0.6–1.1)
EGFR: 81 mL/min/{1.73_m2} — AB (ref >90)
GLUCOSE: 86 mg/dL (ref 70–140)
POTASSIUM: 3.5 meq/L (ref 3.5–5.1)
SODIUM: 141 meq/L (ref 136–145)
Total Bilirubin: 0.49 mg/dL (ref 0.20–1.20)
Total Protein: 7.4 g/dL (ref 6.4–8.3)

## 2015-11-09 LAB — CBC WITH DIFFERENTIAL/PLATELET
BASO%: 0.1 % (ref 0.0–2.0)
Basophils Absolute: 0 10*3/uL (ref 0.0–0.1)
EOS ABS: 0.2 10*3/uL (ref 0.0–0.5)
EOS%: 1.8 % (ref 0.0–7.0)
HCT: 38.1 % (ref 34.8–46.6)
HEMOGLOBIN: 12.8 g/dL (ref 11.6–15.9)
LYMPH%: 32.9 % (ref 14.0–49.7)
MCH: 30.4 pg (ref 25.1–34.0)
MCHC: 33.6 g/dL (ref 31.5–36.0)
MCV: 90.5 fL (ref 79.5–101.0)
MONO#: 0.9 10*3/uL (ref 0.1–0.9)
MONO%: 10.4 % (ref 0.0–14.0)
NEUT#: 4.7 10*3/uL (ref 1.5–6.5)
NEUT%: 54.8 % (ref 38.4–76.8)
Platelets: 155 10*3/uL (ref 145–400)
RBC: 4.21 10*6/uL (ref 3.70–5.45)
RDW: 13.1 % (ref 11.2–14.5)
WBC: 8.7 10*3/uL (ref 3.9–10.3)
lymph#: 2.9 10*3/uL (ref 0.9–3.3)

## 2015-11-09 LAB — LACTATE DEHYDROGENASE: LDH: 156 U/L (ref 125–245)

## 2015-11-10 LAB — KAPPA/LAMBDA LIGHT CHAINS
IG LAMBDA FREE LIGHT CHAIN: 20.87 mg/L (ref 5.71–26.30)
Ig Kappa Free Light Chain: 19.06 mg/L (ref 3.30–19.40)
Kappa/Lambda FluidC Ratio: 0.91 (ref 0.26–1.65)

## 2015-11-10 LAB — IGG, IGA, IGM
IGM (IMMUNOGLOBIN M), SRM: 180 mg/dL (ref 26–217)
IgA, Qn, Serum: 265 mg/dL (ref 64–422)
IgG, Qn, Serum: 1311 mg/dL (ref 700–1600)

## 2015-11-10 LAB — BETA 2 MICROGLOBULIN, SERUM: Beta-2: 1.4 mg/L (ref 0.6–2.4)

## 2015-11-16 ENCOUNTER — Ambulatory Visit (HOSPITAL_BASED_OUTPATIENT_CLINIC_OR_DEPARTMENT_OTHER): Payer: Medicare Other

## 2015-11-16 ENCOUNTER — Encounter: Payer: Self-pay | Admitting: Internal Medicine

## 2015-11-16 ENCOUNTER — Telehealth: Payer: Self-pay | Admitting: Internal Medicine

## 2015-11-16 ENCOUNTER — Ambulatory Visit (HOSPITAL_BASED_OUTPATIENT_CLINIC_OR_DEPARTMENT_OTHER): Payer: Medicare Other | Admitting: Internal Medicine

## 2015-11-16 VITALS — BP 161/84 | HR 75 | Temp 98.4°F | Resp 18 | Ht 61.0 in | Wt 151.8 lb

## 2015-11-16 DIAGNOSIS — C9000 Multiple myeloma not having achieved remission: Secondary | ICD-10-CM

## 2015-11-16 DIAGNOSIS — C9001 Multiple myeloma in remission: Secondary | ICD-10-CM

## 2015-11-16 MED ORDER — ZOLEDRONIC ACID 4 MG/100ML IV SOLN
4.0000 mg | Freq: Once | INTRAVENOUS | Status: AC
Start: 2015-11-16 — End: 2015-11-16
  Administered 2015-11-16: 4 mg via INTRAVENOUS
  Filled 2015-11-16: qty 100

## 2015-11-16 NOTE — Telephone Encounter (Signed)
Gv pt appts for April + July.

## 2015-11-16 NOTE — Progress Notes (Signed)
[facility] [phone]   Fax:([phone]) [place], [place] [address]  DIAGNOSIS: Multiple myeloma, IgG kappa subtype diagnosed in October 2008.   PRIOR THERAPY:  1. Status post radiotherapy to the lower lumbar spine and left pelvic area under the care of Dr. Sondra Come. The patient received a total dose of 3000 cGy. in 15 fractions between 10/14/2007 through 11/07/2007  2.Status post 6 cycles of systemic chemotherapy with Revlimid and Decadron. Last dose given April 2009.  3. status post autologous peripheral blood stem cell transplant on 04/01/2008 under the care of Dr. Valarie Merino at [facility].   CURRENT THERAPY: Zometa 4 mg IV given every 3 months.  INTERVAL HISTORY: Ruth Sanchez 70 y.o. female returns to the clinic today for routine six-month followup visit accompanied by her husband. He is feeling fine today with no specific complaints except for the persistent low back pain and muscle spasm. She denied having any nausea or vomiting. She denied having any significant weight loss or night sweats. She denied having any chest pain, shortness of breath with exertion and no cough or hemoptysis. She had repeat myeloma panel performed recently and she is here for evaluation and discussion of her lab results.  MEDICAL HISTORY: Past Medical History  Diagnosis Date  . Cancer ([place])   . Hypertension   . Asthma   . Arthritis     ALLERGIES:  is allergic to aspirin and gabapentin.  MEDICATIONS:  Current Outpatient Prescriptions  Medication Sig Dispense Refill  . albuterol (PROVENTIL HFA;VENTOLIN HFA) 108 (90 BASE) MCG/ACT inhaler Inhale 2 puffs into the lungs every 6 (six) hours as needed for wheezing or shortness of breath. 1 Inhaler 0  . amLODipine (NORVASC) 10 MG tablet Take 1 tablet (10 mg total) by mouth daily. (Patient not taking: Reported on 10/28/2015) 30 tablet 3  . carisoprodol  (SOMA) 350 MG tablet Take 1 tablet (350 mg total) by mouth 2 (two) times daily as needed for muscle spasms. 60 tablet 0  . diphenoxylate-atropine (LOMOTIL) 2.5-0.025 MG tablet Take 1 tablet by mouth 4 (four) times daily as needed for diarrhea or loose stools. 30 tablet 1  . HYDROcodone-acetaminophen (NORCO/VICODIN) 5-325 MG tablet Take 1 tablet by mouth every 6 (six) hours as needed for moderate pain. 20 tablet 0  . oxyCODONE (ROXICODONE) 5 MG immediate release tablet Take 1 tablet (5 mg total) by mouth every 12 (twelve) hours as needed for severe pain. 60 tablet 0   No current facility-administered medications for this visit.    SURGICAL HISTORY:  Past Surgical History  Procedure Laterality Date  . Appendectomy    . Tubal ligation    . Cholecystectomy      REVIEW OF SYSTEMS:  A comprehensive review of systems was negative except for: Constitutional: positive for fatigue Musculoskeletal: positive for arthralgias, back pain and Muscle spasm   PHYSICAL EXAMINATION: General appearance: alert, cooperative, fatigued and no distress Head: Normocephalic, without obvious abnormality, atraumatic Neck: no adenopathy Lymph nodes: Cervical, supraclavicular, and axillary nodes normal. Resp: clear to auscultation bilaterally Cardio: regular rate and rhythm, S1, S2 normal, no murmur, click, rub or gallop GI: soft, non-tender; bowel sounds normal; no masses,  no organomegaly Extremities: extremities normal, atraumatic, no cyanosis or edema   ECOG PERFORMANCE STATUS: 1 - Symptomatic but completely ambulatory  Blood pressure 161/84, pulse 75, temperature 98.4 F (36.9 C), temperature source Oral, resp. rate 18, height  '5\' 1"'  (1.549 m), weight 151 lb 12.8 oz (68.856 kg), SpO2 98 %.  LABORATORY DATA: Lab Results  Component Value Date   WBC 8.7 11/09/2015   HGB 12.8 11/09/2015   HCT 38.1 11/09/2015   MCV 90.5 11/09/2015   PLT 155 11/09/2015      Chemistry      Component Value Date/Time   NA  141 11/09/2015 1332   NA 141 08/24/2015 1144   K 3.5 11/09/2015 1332   K 3.9 08/24/2015 1144   CL 104 08/24/2015 1144   CL 104 01/19/2013 1058   CO2 26 11/09/2015 1332   CO2 28 08/24/2015 1144   BUN 12.9 11/09/2015 1332   BUN 9 08/24/2015 1144   CREATININE 0.8 11/09/2015 1332   CREATININE 0.60 08/24/2015 1144   CREATININE 0.62 08/15/2015 1729      Component Value Date/Time   CALCIUM 9.0 11/09/2015 1332   CALCIUM 8.8 08/24/2015 1144   ALKPHOS 114 11/09/2015 1332   ALKPHOS 103 08/15/2015 1729   AST 15 11/09/2015 1332   AST 20 08/15/2015 1729   ALT 13 11/09/2015 1332   ALT 21 08/15/2015 1729   BILITOT 0.49 11/09/2015 1332   BILITOT 0.7 08/15/2015 1729     Other lab results: Beta-2 microglobulin 1.4, free kappa light chain 19.06, free lambda light chain 20.87 with a kappa/lambda ratio 0.91, IgG 1311, IgA 265 and IgM 180.  RADIOGRAPHIC STUDIES: No results found.  ASSESSMENT AND PLAN: This is a very pleasant 70 years old African American female with history of multiple myeloma status post systemic chemotherapy with Revlimid and Decadron followed by this peripheral blood stem cell transplant and has been observation since June of 2009 with no significant evidence for disease progression.  The recent Myeloma panel is unremarkable for any concerning abnormality. I discussed the lab result with the patient today and recommended for her to continue on observation with repeat myeloma panel in 6 months.  She will continue her treatment with Zometa every 3 months. She was advised to call immediately if she has any concerning symptoms in the interval.  All questions were answered. The patient knows to call the clinic with any problems, questions or concerns. We can certainly see the patient much sooner if necessary.  Disclaimer: This note was dictated with voice recognition software. Similar sounding words can inadvertently be transcribed and may not be corrected upon review.

## 2015-11-16 NOTE — Patient Instructions (Signed)

## 2015-11-24 ENCOUNTER — Encounter (HOSPITAL_COMMUNITY): Payer: Self-pay | Admitting: Emergency Medicine

## 2015-11-24 ENCOUNTER — Encounter (HOSPITAL_COMMUNITY)
Admission: EM | Disposition: A | Discharge status: Home / Self Care | Payer: Self-pay | Source: Home / Self Care | Attending: Emergency Medicine

## 2015-11-24 ENCOUNTER — Emergency Department (HOSPITAL_COMMUNITY): Payer: Medicare Other

## 2015-11-24 ENCOUNTER — Emergency Department (HOSPITAL_COMMUNITY)
Admission: EM | Admit: 2015-11-24 | Discharge: 2015-11-24 | Disposition: A | Discharge status: Home / Self Care | Payer: Medicare Other | Attending: Emergency Medicine | Admitting: Emergency Medicine

## 2015-11-24 DIAGNOSIS — X58XXXA Exposure to other specified factors, initial encounter: Secondary | ICD-10-CM | POA: Diagnosis not present

## 2015-11-24 DIAGNOSIS — Z9049 Acquired absence of other specified parts of digestive tract: Secondary | ICD-10-CM | POA: Insufficient documentation

## 2015-11-24 DIAGNOSIS — I517 Cardiomegaly: Secondary | ICD-10-CM | POA: Diagnosis not present

## 2015-11-24 DIAGNOSIS — K222 Esophageal obstruction: Secondary | ICD-10-CM | POA: Diagnosis not present

## 2015-11-24 DIAGNOSIS — K449 Diaphragmatic hernia without obstruction or gangrene: Secondary | ICD-10-CM | POA: Diagnosis not present

## 2015-11-24 DIAGNOSIS — R079 Chest pain, unspecified: Secondary | ICD-10-CM | POA: Diagnosis not present

## 2015-11-24 DIAGNOSIS — C9000 Multiple myeloma not having achieved remission: Secondary | ICD-10-CM | POA: Insufficient documentation

## 2015-11-24 DIAGNOSIS — T18128A Food in esophagus causing other injury, initial encounter: Secondary | ICD-10-CM | POA: Insufficient documentation

## 2015-11-24 DIAGNOSIS — I10 Essential (primary) hypertension: Secondary | ICD-10-CM | POA: Diagnosis not present

## 2015-11-24 DIAGNOSIS — M1990 Unspecified osteoarthritis, unspecified site: Secondary | ICD-10-CM | POA: Insufficient documentation

## 2015-11-24 DIAGNOSIS — J45909 Unspecified asthma, uncomplicated: Secondary | ICD-10-CM | POA: Diagnosis not present

## 2015-11-24 DIAGNOSIS — R1310 Dysphagia, unspecified: Secondary | ICD-10-CM | POA: Diagnosis not present

## 2015-11-24 DIAGNOSIS — Z884 Allergy status to anesthetic agent status: Secondary | ICD-10-CM | POA: Diagnosis not present

## 2015-11-24 DIAGNOSIS — Z888 Allergy status to other drugs, medicaments and biological substances status: Secondary | ICD-10-CM | POA: Diagnosis not present

## 2015-11-24 DIAGNOSIS — T18108A Unspecified foreign body in esophagus causing other injury, initial encounter: Secondary | ICD-10-CM | POA: Diagnosis not present

## 2015-11-24 DIAGNOSIS — R51 Headache: Secondary | ICD-10-CM | POA: Insufficient documentation

## 2015-11-24 DIAGNOSIS — I4891 Unspecified atrial fibrillation: Secondary | ICD-10-CM | POA: Diagnosis not present

## 2015-11-24 DIAGNOSIS — Z8673 Personal history of transient ischemic attack (TIA), and cerebral infarction without residual deficits: Secondary | ICD-10-CM | POA: Diagnosis not present

## 2015-11-24 HISTORY — PX: ESOPHAGOGASTRODUODENOSCOPY: SHX5428

## 2015-11-24 LAB — CBC WITH DIFFERENTIAL/PLATELET
BASOS ABS: 0 10*3/uL (ref 0.0–0.1)
BASOS PCT: 0 %
Eosinophils Absolute: 0.1 10*3/uL (ref 0.0–0.7)
Eosinophils Relative: 1 %
HEMATOCRIT: 38 % (ref 36.0–46.0)
HEMOGLOBIN: 12.5 g/dL (ref 12.0–15.0)
LYMPHS PCT: 24 %
Lymphs Abs: 2.3 10*3/uL (ref 0.7–4.0)
MCH: 29.7 pg (ref 26.0–34.0)
MCHC: 32.9 g/dL (ref 30.0–36.0)
MCV: 90.3 fL (ref 78.0–100.0)
Monocytes Absolute: 0.5 10*3/uL (ref 0.1–1.0)
Monocytes Relative: 5 %
NEUTROS ABS: 6.5 10*3/uL (ref 1.7–7.7)
NEUTROS PCT: 70 %
Platelets: 163 10*3/uL (ref 150–400)
RBC: 4.21 MIL/uL (ref 3.87–5.11)
RDW: 13.3 % (ref 11.5–15.5)
WBC: 9.4 10*3/uL (ref 4.0–10.5)

## 2015-11-24 LAB — COMPREHENSIVE METABOLIC PANEL
ALBUMIN: 4 g/dL (ref 3.5–5.0)
ALK PHOS: 91 U/L (ref 38–126)
ALT: 18 U/L (ref 14–54)
AST: 24 U/L (ref 15–41)
Anion gap: 12 (ref 5–15)
BILIRUBIN TOTAL: 1 mg/dL (ref 0.3–1.2)
BUN: 9 mg/dL (ref 6–20)
CO2: 25 mmol/L (ref 22–32)
CREATININE: 0.65 mg/dL (ref 0.44–1.00)
Calcium: 8.8 mg/dL — ABNORMAL LOW (ref 8.9–10.3)
Chloride: 109 mmol/L (ref 101–111)
GFR calc Af Amer: >60 mL/min (ref >60)
GFR calc non Af Amer: >60 mL/min (ref >60)
GLUCOSE: 87 mg/dL (ref 65–99)
POTASSIUM: 2.9 mmol/L — AB (ref 3.5–5.1)
Sodium: 146 mmol/L — ABNORMAL HIGH (ref 135–145)
TOTAL PROTEIN: 7.8 g/dL (ref 6.5–8.1)

## 2015-11-24 LAB — TROPONIN I: Troponin I: <0.03 ng/mL (ref <0.031)

## 2015-11-24 LAB — PROTIME-INR
INR: 1.09 (ref 0.00–1.49)
Prothrombin Time: 14.3 seconds (ref 11.6–15.2)

## 2015-11-24 SURGERY — EGD (ESOPHAGOGASTRODUODENOSCOPY)
Anesthesia: Moderate Sedation

## 2015-11-24 MED ORDER — FENTANYL CITRATE (PF) 100 MCG/2ML IJ SOLN
INTRAMUSCULAR | Status: AC
Start: 2015-11-24 — End: 2015-11-24
  Filled 2015-11-24: qty 2

## 2015-11-24 MED ORDER — IOHEXOL 350 MG/ML SOLN
100.0000 mL | Freq: Once | INTRAVENOUS | Status: AC | PRN
  Administered 2015-11-24: 100 mL via INTRAVENOUS

## 2015-11-24 MED ORDER — SODIUM CHLORIDE 0.9 % IV SOLN
INTRAVENOUS | Status: DC
  Administered 2015-11-24: 22:00:00 via INTRAVENOUS

## 2015-11-24 MED ORDER — POTASSIUM CHLORIDE CRYS ER 20 MEQ PO TBCR: 40.0000 meq | EXTENDED_RELEASE_TABLET | Freq: Once | ORAL | Status: DC

## 2015-11-24 MED ORDER — MIDAZOLAM HCL 10 MG/2ML IJ SOLN
INTRAMUSCULAR | Status: DC | PRN
  Administered 2015-11-24 (×5): 2 mg via INTRAVENOUS

## 2015-11-24 MED ORDER — METOCLOPRAMIDE HCL 5 MG/ML IJ SOLN
10.0000 mg | Freq: Once | INTRAMUSCULAR | Status: AC
  Administered 2015-11-24: 10 mg via INTRAVENOUS
  Filled 2015-11-24: qty 2

## 2015-11-24 MED ORDER — MIDAZOLAM HCL 5 MG/ML IJ SOLN
INTRAMUSCULAR | Status: AC
  Filled 2015-11-24: qty 2

## 2015-11-24 MED ORDER — GLUCAGON HCL RDNA (DIAGNOSTIC) 1 MG IJ SOLR
1.0000 mg | Freq: Once | INTRAMUSCULAR | Status: AC
  Administered 2015-11-24: 1 mg via INTRAVENOUS
  Filled 2015-11-24: qty 1

## 2015-11-24 MED ORDER — FENTANYL CITRATE (PF) 100 MCG/2ML IJ SOLN
50.0000 ug | Freq: Once | INTRAMUSCULAR | Status: AC
  Administered 2015-11-24: 50 ug via INTRAVENOUS
  Filled 2015-11-24: qty 2

## 2015-11-24 MED ORDER — POTASSIUM CHLORIDE 10 MEQ/100ML IV SOLN
10.0000 meq | Freq: Once | INTRAVENOUS | Status: AC
  Administered 2015-11-24: 10 meq via INTRAVENOUS
  Filled 2015-11-24: qty 100

## 2015-11-24 MED ORDER — DIPHENHYDRAMINE HCL 50 MG/ML IJ SOLN
25.0000 mg | Freq: Once | INTRAMUSCULAR | Status: AC
  Administered 2015-11-24: 25 mg via INTRAVENOUS
  Filled 2015-11-24: qty 1

## 2015-11-24 MED ORDER — FENTANYL CITRATE (PF) 100 MCG/2ML IJ SOLN
INTRAMUSCULAR | Status: DC | PRN
  Administered 2015-11-24 (×4): 25 ug via INTRAVENOUS

## 2015-11-24 MED ORDER — OMEPRAZOLE 20 MG PO CPDR: 20.0000 mg | DELAYED_RELEASE_CAPSULE | Freq: Every day | ORAL | Status: DC

## 2015-11-24 MED ORDER — ONDANSETRON HCL 4 MG/2ML IJ SOLN
4.0000 mg | Freq: Once | INTRAMUSCULAR | Status: AC
  Administered 2015-11-24: 4 mg via INTRAVENOUS
  Filled 2015-11-24: qty 2

## 2015-11-24 NOTE — Consult Note (Signed)
Reason for Consult: Food impaction Referring Physician: ER physician  Ruth Sanchez is an 70 y.o. female.  HPI: This is a second episode of a food impaction for this nice lady and the first one was in 2011 and that endoscopy was reviewed and she has not had any swallowing problems until today while eating a pork chop felt it got caught intermittent epigastric area and has been unable to vomit it up and glucagon was not helpful by the ER physician and we are asked to proceed with endoscopic attempts at removal  Past Medical History  Diagnosis Date  . Cancer ([place])   . Hypertension   . Asthma   . Arthritis     Past Surgical History  Procedure Laterality Date  . Appendectomy    . Tubal ligation    . Cholecystectomy      Family History  Problem Relation Age of Onset  . Family history unknown: Yes    Social History:  reports that she has never smoked. She has never used smokeless tobacco. She reports that she does not drink alcohol or use illicit drugs.  Allergies:  Allergies  Allergen Reactions  . Aspirin     Itching and rash  . Gabapentin Swelling    Medications: I have reviewed the patient's current medications.  No results found for this or any previous visit (from the past 48 hour(s)).  No results found.  ROS negative except above Blood pressure 158/98, pulse 95, temperature 98.1 F (36.7 C), temperature source Oral, resp. rate 20, height 5\' 1"  (1.549 m), weight 68.04 kg (150 lb), SpO2 97 %. Physical Exam vital signs stable afebrile exam please see preassessment evaluation  Assessment/Plan: Food impaction Plan: The risks benefits methods of endoscopy with removing the food was discussed with the patient and will proceed ASAP with further workup and plans and recommendations pending those findings  Shana Zavaleta E 11/24/2015, 5:44 PM

## 2015-11-24 NOTE — ED Provider Notes (Signed)
Patient with history of multiple myeloma and hypertension presenting with food impaction. She underwent an EGD at bedside by Dr. Watt Climes of gastroenterology. On completion of this procedure she was found to be in atrial fibrillation with RVR complaining of chest pain and headache. Patient denies any history of atrial fibrillation but her husband states she has a history of "irregular heartbeat.   Irregular irregular rhythm, equal breath sounds. No distress.  EKG in June 2016 was atrial fibrillation. She was admitted with atrial fibrillation in May 2014. She is not anticoagulated.  Heart rate is improved without treatment in the ED. She is breathing more comfortably. She complains of a headache that she states is due to vomiting all day. Denies sudden onset during vomiting. Her neurological exam is nonfocal.  CT head is negative for acute pathology. She is aware of her enlarged thyroid and has neglected to follow-up for ultrasound as recommended in the past. This has been present at least since 2014.  She declined workup at this time.  Do not feel this was responsible for her dysphagia.  Per GI notes, patient had liquids only tonight and PPI for 2 weeks.  Patient's heart rate and blood pressure have improved. She is in rate controlled atrial fibrillation. She is tolerating liquids. She is stable for discharge. Follow up with GI and PCP. Return precautions discussed.  Ezequiel Essex, MD 11/25/15 [phone]

## 2015-11-24 NOTE — ED Notes (Signed)
Pt reported having a piece of pork chop stuck in her throat. Pt reported that she has been self-inducing vomiting trying to get it out but was unable to too. No dyspnea, SHOB, or resp distress noted.

## 2015-11-24 NOTE — Op Note (Signed)
[facility] [place], [address]   ENDOSCOPY PROCEDURE REPORT  PATIENT: Ruth Sanchez, Ruth Sanchez  MR#: [id number] BIRTHDATE: 12-12-1945 , 29  yrs. old GENDER: female ENDOSCOPIST: Clarene Essex, MD REFERRED BY: PROCEDURE DATE:  2015-12-07 PROCEDURE:  EGD w/ balloon dilation and EGD w/ fb removal ASA CLASS:     Class III INDICATIONS:  dysphagia.  food impaction MEDICATIONS: Fentanyl 100 mcg IV and Versed 10 mg IV TOPICAL ANESTHETIC: none  DESCRIPTION OF PROCEDURE: After the risks benefits and alternatives of the procedure were thoroughly explained, informed consent was obtained.  The Pentax Gastroscope [id number] endoscope was introduced through the mouth and advanced to the mid esophagus where a ring was seen and we could not advance the scope so we advanced the balloon dilatorwire guided and initially inflated to 12 mm and after 1 minutestill could not advance the scopeso we inflated the balloon to 13.5 and were still unsuccessful and 1 minute of advancing the regular scopeso we xchange scopes to the pediatric scope and were able to advance through the ring until we saw the distal esophageal food bolus and with gentle pressure we were able to push it into the stomach which required a fewadvances and withdrawals since the food broke up nicely and the head of the bed was elevated and the remaining food was washed into the stomach and then we proceeded withadvancing to the esecond portion of the duodenum , Without limitations.  The instrument was slowly withdrawn as the mucosa was fully examined. Estimated blood loss is zero unless otherwise noted in this procedure report.    findings are recorded below       Retroflexed views revealed a hiatal hernia.     The scope was then withdrawn from the patient and the procedure completed.  COMPLICATIONS: There were no immediate complications.  ENDOSCOPIC IMPRESSION:. 1. Mid esophageal ringdilated as aboveunable to  advance regular scopehowever able to advance pediatric scopeand pushfood bolus into the stomach as above #43moderate hiatal herniawith proximal ring as well easily able to pass scope3.Otherwise within normal limits to the second portion of the duodenum .  RECOMMENDATIONS: liquids only tonight2 weeks ofpump inhibitorschew food wellplenty of liquids GI follow-up when necessary and have discussed all the above with the husband and they will discuss    er A. fib with their primary care physician on Monday but she is not aware of itbut he says she's had that for years  REPEAT EXAM: as needed  eSigned:  Clarene Essex, MD 12-07-15 7:06 PM    CC:  CPT CODES: ICD CODES:  The ICD and CPT codes recommended by this software are interpretations from the data that the clinical staff has captured with the software.  The verification of the translation of this report to the ICD and CPT codes and modifiers is the sole responsibility of the health care institution and practicing physician where this report was generated.  [employer] will not be held responsible for the validity of the ICD and CPT codes included on this report.  AMA assumes no liability for data contained or not contained herein. CPT is a Designer, television/film set of the [employer].

## 2015-11-24 NOTE — ED Provider Notes (Signed)
CSN: [id number]     Arrival date & time 11/24/15  40 History   First MD Initiated Contact with Patient 11/24/15 1422     Chief Complaint  Patient presents with  . Dysphagia     (Consider location/radiation/quality/duration/timing/severity/associated sxs/prior Treatment) HPI Comments: 70 year old female with past mental history including multiple myeloma, hypertension who presents with food stuck in her throat. At 11:30 AM today, the patient was eating a pork chop when she felt a piece get stuck in her throat. She has been unable to swallow since then. She has been trying to vomit to get the piece up but has been unable to do so. She feels like the area is swelling. She denies any difficulty breathing. She states this happened once previously and she required endoscopy for removal.  The history is provided by the patient.    Past Medical History  Diagnosis Date  . Cancer ([place])   . Hypertension   . Asthma   . Arthritis    Past Surgical History  Procedure Laterality Date  . Appendectomy    . Tubal ligation    . Cholecystectomy     Family History  Problem Relation Age of Onset  . Family history unknown: Yes   Social History  Substance Use Topics  . Smoking status: Never Smoker   . Smokeless tobacco: Never Used  . Alcohol Use: No   OB History    No data available     Review of Systems 10 Systems reviewed and are negative for acute change except as noted in the HPI.    Allergies  Aspirin and Gabapentin  Home Medications   Prior to Admission medications   Medication Sig Start Date End Date Taking? Authorizing Provider  albuterol (PROVENTIL HFA;VENTOLIN HFA) 108 (90 BASE) MCG/ACT inhaler Inhale 2 puffs into the lungs every 6 (six) hours as needed for wheezing or shortness of breath. 07/06/15  Yes Arnoldo Morale, MD  amLODipine (NORVASC) 10 MG tablet Take 1 tablet (10 mg total) by mouth daily. 09/23/15  Yes Arnoldo Morale, MD  carisoprodol (SOMA) 350 MG tablet Take 1 tablet  (350 mg total) by mouth 2 (two) times daily as needed for muscle spasms. 10/28/15  Yes Arnoldo Morale, MD  diphenoxylate-atropine (LOMOTIL) 2.5-0.025 MG tablet Take 1 tablet by mouth 4 (four) times daily as needed for diarrhea or loose stools. 09/23/15  Yes Arnoldo Morale, MD  oxyCODONE (ROXICODONE) 5 MG immediate release tablet Take 1 tablet (5 mg total) by mouth every 12 (twelve) hours as needed for severe pain. 10/28/15  Yes Arnoldo Morale, MD  HYDROcodone-acetaminophen (NORCO/VICODIN) 5-325 MG tablet TAKE 1 TABLET BY MOUTH EVERY 6 HOURS AS NEEDED FOR moderate PAIN. 10/26/15   Historical Provider, MD   BP 158/98 mmHg  Pulse 95  Temp(Src) 98.1 F (36.7 C) (Oral)  Resp 20  Ht '5\' 1"'  (1.549 m)  Wt 150 lb (68.04 kg)  BMI 28.36 kg/m2  SpO2 97% Physical Exam  Constitutional: She is oriented to person, place, and time. She appears well-developed and well-nourished.  Uncomfortable, spitting into bag  HENT:  Head: Normocephalic and atraumatic.  Mouth/Throat: Oropharynx is clear and moist.  Moist mucous membranes  Eyes: Conjunctivae are normal. Pupils are equal, round, and reactive to light.  Neck: Neck supple.  Cardiovascular: Normal rate, regular rhythm and normal heart sounds.   No murmur heard. Pulmonary/Chest: Effort normal and breath sounds normal. No respiratory distress.  Abdominal: Soft. Bowel sounds are normal. She exhibits no distension. There is no tenderness.  Musculoskeletal: She exhibits no edema.  Neurological: She is alert and oriented to person, place, and time.  Fluent speech  Skin: Skin is warm and dry.  Psychiatric: She has a normal mood and affect. Judgment normal.  Nursing note and vitals reviewed.   ED Course  Procedures (including critical care time) Labs Review Labs Reviewed - No data to display   EKG Interpretation None     Medications  ondansetron (ZOFRAN) injection 4 mg (4 mg Intravenous Given 11/24/15 1452)  glucagon (human recombinant) (GLUCAGEN) injection 1 mg  (1 mg Intravenous Given 11/24/15 1453)    MDM   Final diagnoses:  Food impaction of esophagus, initial encounter   PT w/ h/o food impaction requiring endoscopic removal p/w piece of pork chop stuck in throat. On exam, she was uncomfortable but in no respiratory distress. O2 sat 97% on room air and normal work of breathing. She was spitting into a bag and was unable to swallow secretions. Gave the patient glucagon and Zofran. Contacted GI and discussed w/ Dr. Watt Climes. Since the patient has not had any response to medications, he will prepare for endoscopy. The patient remains stable with no respiratory compromise. I'm signing the patient out to the oncoming provider. Her disposition is pending successful disimpaction of food bolus.    Sharlett Iles, MD 11/24/15 [phone]

## 2015-11-24 NOTE — ED Notes (Signed)
Patient stated she went into the bathroom and vomited, trying to get the meat up. Patient c/o throat pain.

## 2015-11-24 NOTE — ED Notes (Signed)
Transported to CT 

## 2015-11-24 NOTE — ED Notes (Signed)
Bed: WA17 Expected date:  Expected time:  Means of arrival:  Comments: Pt in room

## 2015-11-24 NOTE — Discharge Instructions (Addendum)
Call if question or problem otherwise follow-up in the office if swallowing problems continue and discuss your abnormal heart rhythm with primary care physician and recommend 2 weeks of Prilosec OTC or omeprazole 20 mg once a day for 2 weeks and have liquids only tonight and slowly advance her diet tomorrow and chew your food well and drink plenty of liquids with it  Follow up with your doctor for an ultrasound of your enlarged thyroid gland.   Atrial Fibrillation Atrial fibrillation is a type of irregular or rapid heartbeat (arrhythmia). In atrial fibrillation, the heart quivers continuously in a chaotic pattern. This occurs when parts of the heart receive disorganized signals that make the heart unable to pump blood normally. This can increase the risk for stroke, heart failure, and other heart-related conditions. There are different types of atrial fibrillation, including:  Paroxysmal atrial fibrillation. This type starts suddenly, and it usually stops on its own shortly after it starts.  Persistent atrial fibrillation. This type often lasts longer than a week. It may stop on its own or with treatment.  Long-lasting persistent atrial fibrillation. This type lasts longer than 12 months.  Permanent atrial fibrillation. This type does not go away. Talk with your health care provider to learn about the type of atrial fibrillation that you have. CAUSES This condition is caused by some heart-related conditions or procedures, including:  A heart attack.  Coronary artery disease.  Heart failure.  Heart valve conditions.  High blood pressure.  Inflammation of the sac that surrounds the heart (pericarditis).  Heart surgery.  Certain heart rhythm disorders, such as Wolf-Parkinson-White syndrome. Other causes include:  Pneumonia.  Obstructive sleep apnea.  Blockage of an artery in the lungs (pulmonary embolism, or PE).  Lung cancer.  Chronic lung disease.  Thyroid problems,  especially if the thyroid is overactive (hyperthyroidism).  Caffeine.  Excessive alcohol use or illegal drug use.  Use of some medicines, including certain decongestants and diet pills. Sometimes, the cause cannot be found. RISK FACTORS This condition is more likely to develop in:  People who are older in age.  People who smoke.  People who have diabetes mellitus.  People who are overweight (obese).  Athletes who exercise vigorously. SYMPTOMS Symptoms of this condition include:  A feeling that your heart is beating rapidly or irregularly.  A feeling of discomfort or pain in your chest.  Shortness of breath.  Sudden light-headedness or weakness.  Getting tired easily during exercise. In some cases, there are no symptoms. DIAGNOSIS Your health care provider may be able to detect atrial fibrillation when taking your pulse. If detected, this condition may be diagnosed with:  An electrocardiogram (ECG).  A Holter monitor test that records your heartbeat patterns over a 24-hour period.  Transthoracic echocardiogram (TTE) to evaluate how blood flows through your heart.  Transesophageal echocardiogram (TEE) to view more detailed images of your heart.  A stress test.  Imaging tests, such as a CT scan or chest X-ray.  Blood tests. TREATMENT The main goals of treatment are to prevent blood clots from forming and to keep your heart beating at a normal rate and rhythm. The type of treatment that you receive depends on many factors, such as your underlying medical conditions and how you feel when you are experiencing atrial fibrillation. This condition may be treated with:  Medicine to slow down the heart rate, bring the heart's rhythm back to normal, or prevent clots from forming.  Electrical cardioversion. This is a procedure that resets  your heart's rhythm by delivering a controlled, low-energy shock to the heart through your skin.  Different types of ablation, such as  catheter ablation, catheter ablation with pacemaker, or surgical ablation. These procedures destroy the heart tissues that send abnormal signals. When the pacemaker is used, it is placed under your skin to help your heart beat in a regular rhythm. HOME CARE INSTRUCTIONS  Take over-the counter and prescription medicines only as told by your health care provider.  If your health care provider prescribed a blood-thinning medicine (anticoagulant), take it exactly as told. Taking too much blood-thinning medicine can cause bleeding. If you do not take enough blood-thinning medicine, you will not have the protection that you need against stroke and other problems.  Do not use tobacco products, including cigarettes, chewing tobacco, and e-cigarettes. If you need help quitting, ask your health care provider.  If you have obstructive sleep apnea, manage your condition as told by your health care provider.  Do not drink alcohol.  Do not drink beverages that contain caffeine, such as coffee, soda, and tea.  Maintain a healthy weight. Do not use diet pills unless your health care provider approves. Diet pills may make heart problems worse.  Follow diet instructions as told by your health care provider.  Exercise regularly as told by your health care provider.  Keep all follow-up visits as told by your health care provider. This is important. PREVENTION  Avoid drinking beverages that contain caffeine or alcohol.  Avoid certain medicines, especially medicines that are used for breathing problems.  Avoid certain herbs and herbal medicines, such as those that contain ephedra or ginseng.  Do not use illegal drugs, such as cocaine and amphetamines.  Do not smoke.  Manage your high blood pressure. SEEK MEDICAL CARE IF:  You notice a change in the rate, rhythm, or strength of your heartbeat.  You are taking an anticoagulant and you notice increased bruising.  You tire more easily when you  exercise or exert yourself. SEEK IMMEDIATE MEDICAL CARE IF:  You have chest pain, abdominal pain, sweating, or weakness.  You feel nauseous.  You notice blood in your vomit, bowel movement, or urine.  You have shortness of breath.  You suddenly have swollen feet and ankles.  You feel dizzy.  You have sudden weakness or numbness of the face, arm, or leg, especially on one side of the body.  You have trouble speaking, trouble understanding, or both (aphasia).  Your face or your eyelid droops on one side. These symptoms may represent a serious problem that is an emergency. Do not wait to see if the symptoms will go away. Get medical help right away. Call your local emergency services (911 in the [place] Do not drive yourself to the hospital.   This information is not intended to replace advice given to you by your health care provider. Make sure you discuss any questions you have with your health care provider.   Document Released: 10/08/2005 Document Revised: 06/29/2015 Document Reviewed: 02/02/2015 Elsevier Interactive Patient Education [employer].

## 2015-11-25 ENCOUNTER — Encounter (HOSPITAL_COMMUNITY): Payer: Self-pay | Admitting: Gastroenterology

## 2015-11-25 NOTE — Op Note (Signed)
[facility] [place], [address]   ENDOSCOPY PROCEDURE REPORT  PATIENT: Ruth Sanchez, Ruth Sanchez  MR#: [id number] BIRTHDATE: January 24, 1946 , 41  yrs. old GENDER: female ENDOSCOPIST: Clarene Essex, MD REFERRED BY: PROCEDURE DATE:  11/24/2015 PROCEDURE:  EGD w/ balloon dilation and EGD w/ fb removal ASA CLASS:     Class III INDICATIONS:  dysphagia and foreign body removal. MEDICATIONS: see other report TOPICAL ANESTHETIC: see other report  DESCRIPTION OF PROCEDURE: After the risks benefits and alternatives of the procedure were thoroughly explained, informed consent was obtained.  The Pentax Gastroscope [id number] endoscope was introduced through the mouth and advanced to the second portion of the duodenum after balloon dilation -2nd changing to the pediatricendoscope and pushing food bolus piece by piece into the stomach and raising head of the bed and washing remainder into the stomachoWithout limitations.  The instrument was slowly withdrawn as the mucosa was fully examined. Estimated blood loss is zero unless otherwise noted in this procedure report.    the findings are recorded below       Retroflexed views revealed a hiatal hernia.     The scope was then withdrawn from the patient and the procedure completed.  COMPLICATIONS: There were no immediate complications.  ENDOSCOPIC IMPRESSION: please see other report     this report was done24 hours later when the pictures were available but the other report was not and it was done just to add the pictures  RECOMMENDATIONS: please see other report  REPEAT EXAM: as needed  eSigned:  Clarene Essex, MD 2015/12/23 10:36 PM    CC:  CPT CODES: ICD CODES:  The ICD and CPT codes recommended by this software are interpretations from the data that the clinical staff has captured with the software.  The verification of the translation of this report to the ICD and CPT codes and modifiers is the  sole responsibility of the health care institution and practicing physician where this report was generated.  [employer] will not be held responsible for the validity of the ICD and CPT codes included on this report.  AMA assumes no liability for data contained or not contained herein. CPT is a Designer, television/film set of the [employer].  PATIENT NAME:  [name], [name] MR#: [id number]

## 2015-11-28 ENCOUNTER — Encounter: Payer: Self-pay | Admitting: Family Medicine

## 2015-11-28 ENCOUNTER — Ambulatory Visit: Payer: Medicare Other | Attending: Family Medicine | Admitting: Family Medicine

## 2015-11-28 VITALS — BP 181/135 | HR 81 | Temp 98.9°F | Resp 15 | Ht 61.0 in | Wt 154.8 lb

## 2015-11-28 DIAGNOSIS — G8929 Other chronic pain: Secondary | ICD-10-CM | POA: Diagnosis not present

## 2015-11-28 DIAGNOSIS — M545 Low back pain: Secondary | ICD-10-CM | POA: Diagnosis not present

## 2015-11-28 DIAGNOSIS — C9001 Multiple myeloma in remission: Secondary | ICD-10-CM | POA: Diagnosis not present

## 2015-11-28 DIAGNOSIS — M549 Dorsalgia, unspecified: Secondary | ICD-10-CM | POA: Diagnosis not present

## 2015-11-28 DIAGNOSIS — E876 Hypokalemia: Secondary | ICD-10-CM | POA: Diagnosis not present

## 2015-11-28 DIAGNOSIS — I10 Essential (primary) hypertension: Secondary | ICD-10-CM | POA: Diagnosis not present

## 2015-11-28 DIAGNOSIS — Z79899 Other long term (current) drug therapy: Secondary | ICD-10-CM | POA: Insufficient documentation

## 2015-11-28 MED ORDER — CARISOPRODOL 350 MG PO TABS: 350.0000 mg | ORAL_TABLET | Freq: Two times a day (BID) | ORAL | Status: DC | PRN

## 2015-11-28 MED ORDER — OXYCODONE HCL 5 MG PO TABS: 5.0000 mg | ORAL_TABLET | Freq: Two times a day (BID) | ORAL | Status: DC | PRN

## 2015-11-28 NOTE — Progress Notes (Signed)
Subjective:  Patient ID: Ruth Sanchez, female    DOB: 10/15/1946  Age: 70 y.o. MRN: [id number]  CC: Follow-up   HPI MATEYA TORTI 70 year old female with a history of Multiple Myeloma (managed by oncology and currently on Zometa infusions every 3 months), chronic low back pain, Hypertension here for a follow up visit.  Since her last office visit she had an ED presentation due to impaction of food after a pork chop got stuck in her throat (as per the patient this is the second episode of such occurrence)  for which she had an EGD by GI during which the food bolus was pushed into stomach. After the procedure she was found to be in A. fib with RVR which improved without treatment after which she was subsequently discharged.  Today she is requesting a refill of her oxycodone and Soma which she takes for back pain. Her blood pressure is severely elevated which she attributes to not taking her antihypertensive today and the fact that she is in severe pain.    Outpatient Prescriptions Prior to Visit  Medication Sig Dispense Refill  . albuterol (PROVENTIL HFA;VENTOLIN HFA) 108 (90 BASE) MCG/ACT inhaler Inhale 2 puffs into the lungs every 6 (six) hours as needed for wheezing or shortness of breath. 1 Inhaler 0  . diphenoxylate-atropine (LOMOTIL) 2.5-0.025 MG tablet Take 1 tablet by mouth 4 (four) times daily as needed for diarrhea or loose stools. 30 tablet 1  . omeprazole (PRILOSEC) 20 MG capsule Take 1 capsule (20 mg total) by mouth daily. 14 capsule 0  . carisoprodol (SOMA) 350 MG tablet Take 1 tablet (350 mg total) by mouth 2 (two) times daily as needed for muscle spasms. 60 tablet 0  . oxyCODONE (ROXICODONE) 5 MG immediate release tablet Take 1 tablet (5 mg total) by mouth every 12 (twelve) hours as needed for severe pain. 60 tablet 0  . amLODipine (NORVASC) 10 MG tablet Take 1 tablet (10 mg total) by mouth daily. (Patient not taking: Reported on 11/28/2015) 30 tablet 3  .  HYDROcodone-acetaminophen (NORCO/VICODIN) 5-325 MG tablet Reported on 11/28/2015  0   No facility-administered medications prior to visit.    ROS Review of Systems  Constitutional: Negative for activity change, appetite change and fatigue.  HENT: Negative for congestion, sinus pressure and sore throat.   Eyes: Negative for visual disturbance.  Respiratory: Negative for cough, chest tightness, shortness of breath and wheezing.   Cardiovascular: Negative for chest pain and palpitations.  Gastrointestinal: Negative for abdominal pain, constipation and abdominal distention.  Endocrine: Negative for polydipsia.  Genitourinary: Negative for dysuria and frequency.  Musculoskeletal: Positive for back pain. Negative for arthralgias.  Skin: Negative for rash.  Neurological: Negative for tremors, light-headedness and numbness.  Hematological: Does not bruise/bleed easily.  Psychiatric/Behavioral: Negative for behavioral problems and agitation.    Objective:  BP 181/135 mmHg  Pulse 81  Temp(Src) 98.9 F (37.2 C)  Resp 15  Ht '5\' 1"'  (1.549 m)  Wt 154 lb 12.8 oz (70.217 kg)  BMI 29.26 kg/m2  SpO2 100%  BP/Weight 11/28/2015 11/24/2015 5/63/8756  Systolic BP 433 295 188  Diastolic BP 416 93 84  Wt. (Lbs) 154.8 150 151.8  BMI 29.26 28.36 28.7      Physical Exam Constitutional: She is oriented to person, place, and time, teary eyed and in acute pain  Cardiovascular: Normal rate, normal heart sounds and intact distal pulses.   No murmur heard. Pulmonary/Chest: Effort normal and breath sounds normal.  She has no wheezes. She has no rales. She exhibits no tenderness.  Abdominal: Soft. Bowel sounds are normal. She exhibits no distension and no mass. There is no tenderness.  Musculoskeletal:  Tenderness on palpation of left side of the lumbar region  Neurological: She is alert and oriented to person, place, and time.   CMP Latest Ref Rng 11/24/2015 11/09/2015 08/24/2015  Glucose 65 - 99 mg/dL 87 86  87  BUN 6 - 20 mg/dL 9 12.9 9  Creatinine 0.44 - 1.00 mg/dL 0.65 0.8 0.60  Sodium 135 - 145 mmol/L 146(H) 141 141  Potassium 3.5 - 5.1 mmol/L 2.9(L) 3.5 3.9  Chloride 101 - 111 mmol/L 109 - 104  CO2 22 - 32 mmol/L '25 26 28  ' Calcium 8.9 - 10.3 mg/dL 8.8(L) 9.0 8.8  Total Protein 6.5 - 8.1 g/dL 7.8 7.4 -  Total Bilirubin 0.3 - 1.2 mg/dL 1.0 0.49 -  Alkaline Phos 38 - 126 U/L 91 114 -  AST 15 - 41 U/L 24 15 -  ALT 14 - 54 U/L 18 13 -     Assessment & Plan:   1. Multiple myeloma in remission [facility]) She received Zometa every 3 months Recently saw her Oncologist - oxyCODONE (ROXICODONE) 5 MG immediate release tablet; Take 1 tablet (5 mg total) by mouth every 12 (twelve) hours as needed for severe pain.  Dispense: 60 tablet; Refill: 0 - carisoprodol (SOMA) 350 MG tablet; Take 1 tablet (350 mg total) by mouth 2 (two) times daily as needed for muscle spasms.  Dispense: 60 tablet; Refill: 0  2. Chronic back pain Currently in acute pain - oxyCODONE (ROXICODONE) 5 MG immediate release tablet; Take 1 tablet (5 mg total) by mouth every 12 (twelve) hours as needed for severe pain.  Dispense: 60 tablet; Refill: 0 - carisoprodol (SOMA) 350 MG tablet; Take 1 tablet (350 mg total) by mouth 2 (two) times daily as needed for muscle spasms.  Dispense: 60 tablet; Refill: 0 - Drug Screen Urine w/Alc, with Conf.; Future  3. Essential hypertension Uncontrolled due to missing morning dose of antihypertensive  4. Hypokalemia Last potassium was 2.9 - Basic Metabolic Panel; Future   Meds ordered this encounter  Medications  . oxyCODONE (ROXICODONE) 5 MG immediate release tablet    Sig: Take 1 tablet (5 mg total) by mouth every 12 (twelve) hours as needed for severe pain.    Dispense:  60 tablet    Refill:  0  . carisoprodol (SOMA) 350 MG tablet    Sig: Take 1 tablet (350 mg total) by mouth 2 (two) times daily as needed for muscle spasms.    Dispense:  60 tablet    Refill:  0    Follow-up: Return  in about 1 month (around 12/26/2015) for Follow-up of back pain and hypertension.   Arnoldo Morale MD

## 2015-11-28 NOTE — Patient Instructions (Signed)

## 2015-11-28 NOTE — Progress Notes (Signed)
Patient is here for refill on her oxycodone and her Manuela Neptune Has not taken her HTN meds this am

## 2015-11-30 ENCOUNTER — Encounter (HOSPITAL_BASED_OUTPATIENT_CLINIC_OR_DEPARTMENT_OTHER): Payer: Medicare Other | Admitting: Clinical

## 2015-11-30 ENCOUNTER — Ambulatory Visit (HOSPITAL_BASED_OUTPATIENT_CLINIC_OR_DEPARTMENT_OTHER): Payer: Medicare Other | Admitting: Family Medicine

## 2015-11-30 ENCOUNTER — Ambulatory Visit: Payer: Medicare Other | Attending: Family Medicine

## 2015-11-30 VITALS — BP 133/84 | HR 95 | Temp 98.0°F | Resp 15

## 2015-11-30 DIAGNOSIS — I10 Essential (primary) hypertension: Secondary | ICD-10-CM | POA: Diagnosis not present

## 2015-11-30 DIAGNOSIS — Z79899 Other long term (current) drug therapy: Secondary | ICD-10-CM | POA: Diagnosis not present

## 2015-11-30 DIAGNOSIS — G8929 Other chronic pain: Secondary | ICD-10-CM | POA: Insufficient documentation

## 2015-11-30 DIAGNOSIS — C9001 Multiple myeloma in remission: Secondary | ICD-10-CM | POA: Diagnosis not present

## 2015-11-30 DIAGNOSIS — E876 Hypokalemia: Secondary | ICD-10-CM

## 2015-11-30 DIAGNOSIS — Z659 Problem related to unspecified psychosocial circumstances: Secondary | ICD-10-CM

## 2015-11-30 DIAGNOSIS — G893 Neoplasm related pain (acute) (chronic): Secondary | ICD-10-CM | POA: Insufficient documentation

## 2015-11-30 DIAGNOSIS — M549 Dorsalgia, unspecified: Secondary | ICD-10-CM | POA: Insufficient documentation

## 2015-11-30 NOTE — Progress Notes (Signed)
Patient here to give urine sample for monthly drug screen Patient asked to speak to RN privately and states the pharmacy "gave me the wrong pills this month" Patient given 60 tablets oxycodone on 2/6 and comes into clinic with 11 pills today.  She states she "dropped the bottle at home and some went under my dishwasher."

## 2015-12-01 ENCOUNTER — Encounter: Payer: Self-pay | Admitting: Family Medicine

## 2015-12-01 LAB — BASIC METABOLIC PANEL
BUN: 11 mg/dL (ref 7–25)
CALCIUM: 8.7 mg/dL (ref 8.6–10.4)
CHLORIDE: 100 mmol/L (ref 98–110)
CO2: 27 mmol/L (ref 20–31)
CREATININE: 0.93 mg/dL (ref 0.60–0.93)
Glucose, Bld: 98 mg/dL (ref 65–99)
Potassium: 3.9 mmol/L (ref 3.5–5.3)
Sodium: 140 mmol/L (ref 135–146)

## 2015-12-01 NOTE — Progress Notes (Signed)
ASSESSMENT: Pt currently experiencing problems related to psychosocial circumstances, needs to f/u with PCP and [facility]; pt would benefit from community resources. Pt would also benefit from further assessment of cognition at next PCP/[facility] visit.  Stage of Change: precontemplative  PLAN: 1. F/U with behavioral health consultant in one month 2. Psychiatric Medications: none. 3. Behavioral recommendation(s):   -Take bag of food home today -Consider referral for food delivery service at next visit  SUBJECTIVE: Pt. referred by Dr Jarold Song for psychosocial:  Pt. reports the following symptoms/concerns: Pt states that her primary concern today is that she is hungry, that she is unable to urinate, and that she dropped her pills on the kitchen floor. She denies having any other needs or concerns at this time, saying that her husband buys food for her, and that he will get the pills off the floor this evening. Pt proud of her two grown sons and grandsons, says her husband takes good care of her.  Duration of problem: today Severity: mild  OBJECTIVE: Orientation & Cognition: Pt forgetful, needs further assessment at next visit Mood: appropriate. Affect: appropriate Appearance: appropriate Risk of harm to self or others: no known risk of harm to self or others Substance use: none Assessments administered: none  Diagnosis: Problem related to psychosocial circumstances CPT Code: Z65.9 -------------------------------------------- Other(s) present in the room: Dr. Jarold Song, RN Vicente Males (part of visit)  Time spent with patient in exam room: 30 minutes, 5pm-5:30pm
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
## 2015-12-06 LAB — DRUG SCREEN 10 W/CONF, SERUM

## 2015-12-23 ENCOUNTER — Encounter: Payer: Self-pay | Admitting: Family Medicine

## 2015-12-23 ENCOUNTER — Telehealth: Payer: Self-pay | Admitting: Family Medicine

## 2015-12-23 ENCOUNTER — Ambulatory Visit: Payer: Medicare Other | Attending: Family Medicine | Admitting: Family Medicine

## 2015-12-23 VITALS — BP 126/90 | HR 117 | Temp 98.6°F | Resp 15 | Ht 61.0 in | Wt 156.0 lb

## 2015-12-23 DIAGNOSIS — I10 Essential (primary) hypertension: Secondary | ICD-10-CM | POA: Diagnosis not present

## 2015-12-23 DIAGNOSIS — M549 Dorsalgia, unspecified: Secondary | ICD-10-CM | POA: Insufficient documentation

## 2015-12-23 DIAGNOSIS — Z79899 Other long term (current) drug therapy: Secondary | ICD-10-CM | POA: Insufficient documentation

## 2015-12-23 DIAGNOSIS — G8929 Other chronic pain: Secondary | ICD-10-CM | POA: Diagnosis not present

## 2015-12-23 DIAGNOSIS — C9001 Multiple myeloma in remission: Secondary | ICD-10-CM | POA: Insufficient documentation

## 2015-12-23 DIAGNOSIS — M545 Low back pain: Secondary | ICD-10-CM | POA: Insufficient documentation

## 2015-12-23 DIAGNOSIS — I4891 Unspecified atrial fibrillation: Secondary | ICD-10-CM | POA: Diagnosis not present

## 2015-12-23 DIAGNOSIS — R000 Tachycardia, unspecified: Secondary | ICD-10-CM | POA: Diagnosis not present

## 2015-12-23 MED ORDER — AMLODIPINE BESYLATE 10 MG PO TABS: 10.0000 mg | ORAL_TABLET | Freq: Every day | ORAL | Status: DC

## 2015-12-23 MED ORDER — OXYCODONE HCL 5 MG PO TABS: 5.0000 mg | ORAL_TABLET | Freq: Two times a day (BID) | ORAL | Status: DC | PRN

## 2015-12-23 NOTE — Telephone Encounter (Signed)
Jarrett Soho from [facility] called stating that pt. Is trying to refill her  oxyCODONE (ROXICODONE) 5 MG immediate release tablet Rx. Jarrett Soho stated she is 5 days early and would like to know if she can get authorization to refill it at this time. Please f/u

## 2015-12-23 NOTE — Telephone Encounter (Signed)
Authorization given per Dr. Jarold Song

## 2015-12-23 NOTE — Progress Notes (Signed)
Patient brought pill bottles and is here for refills

## 2015-12-23 NOTE — Progress Notes (Signed)
Subjective:  Patient ID: Ruth Sanchez, female    DOB: [birth year]  Age: 70 y.o. MRN: [id number]  CC: Back Pain   HPI Ruth Sanchez is 70 year old female with a history of Multiple Myeloma (managed by oncology and currently on Zometa infusions every 3 months), chronic low back pain, Hypertension here for a follow up visit. Last month she had said she spilled oxycodone tablets on the floor and I had referred her to [facility] for management of chronic pain going forward and she is yet to hear from them.  Today she complains of severe back pain and is requesting refills of her antihypertensive.  Outpatient Prescriptions Prior to Visit  Medication Sig Dispense Refill  . albuterol (PROVENTIL HFA;VENTOLIN HFA) 108 (90 BASE) MCG/ACT inhaler Inhale 2 puffs into the lungs every 6 (six) hours as needed for wheezing or shortness of breath. 1 Inhaler 0  . carisoprodol (SOMA) 350 MG tablet Take 1 tablet (350 mg total) by mouth 2 (two) times daily as needed for muscle spasms. 60 tablet 0  . diphenoxylate-atropine (LOMOTIL) 2.5-0.025 MG tablet Take 1 tablet by mouth 4 (four) times daily as needed for diarrhea or loose stools. 30 tablet 1  . omeprazole (PRILOSEC) 20 MG capsule Take 1 capsule (20 mg total) by mouth daily. 14 capsule 0  . amLODipine (NORVASC) 10 MG tablet Take 1 tablet (10 mg total) by mouth daily. 30 tablet 3  . oxyCODONE (ROXICODONE) 5 MG immediate release tablet Take 1 tablet (5 mg total) by mouth every 12 (twelve) hours as needed for severe pain. 60 tablet 0   No facility-administered medications prior to visit.    ROS Review of Systems onstitutional: Negative for activity change, appetite change and fatigue.  HENT: Negative for congestion, sinus pressure and sore throat.   Eyes: Negative for visual disturbance.  Respiratory: Negative for cough, chest tightness, shortness of breath and wheezing.   Cardiovascular: Negative for chest pain and palpitations.    Gastrointestinal: Negative for abdominal pain, constipation and abdominal distention.  Endocrine: Negative for polydipsia.  Genitourinary: Negative for dysuria and frequency.  Musculoskeletal: Positive for back pain. Negative for arthralgias.  Skin: Negative for rash.  Neurological: Negative for tremors, light-headedness and numbness.  Hematological: Does not bruise/bleed easily.  Psychiatric/Behavioral: Negative for behavioral problems and agitation.  Objective:  BP 126/90 mmHg  Pulse 117  Temp(Src) 98.6 F (37 C)  Resp 15  Ht _0  (1.549 m)  Wt 156 lb (70.761 kg)  BMI 29.49 kg/m2  SpO2 98%  BP/Weight 12/23/2015 04/28/9380 0/10/7508  Systolic BP 258 527 782  Diastolic BP 90 84 423  Wt. (Lbs) 156 - 154.8  BMI 29.49 - 29.26      Physical Exam  Constitutional: She is oriented to person, place, and time. She appears well-developed and well-nourished.  Cardiovascular: Normal heart sounds and intact distal pulses.  Tachycardia present.   No murmur heard. Pulmonary/Chest: Effort normal and breath sounds normal. She has no wheezes. She has no rales. She exhibits no tenderness.  Abdominal: Soft. Bowel sounds are normal. She exhibits no distension and no mass. There is no tenderness.  Musculoskeletal: She exhibits tenderness (tendernes to palpation of lumbar spine; patient is currently wearing a back brace).  Neurological: She is alert and oriented to person, place, and time.     Assessment & Plan:   1. Essential hypertension Controlled - amLODipine (NORVASC) 10 MG tablet; Take 1 tablet (10 mg total) by mouth daily.  Dispense: 30  tablet; Refill: 3  2. Multiple myeloma in remission ([place]) In remission Under the care of Oncology - oxyCODONE (ROXICODONE) 5 MG immediate release tablet; Take 1 tablet (5 mg total) by mouth every 12 (twelve) hours as needed for severe pain.  Dispense: 60 tablet; Refill: 0  3. Chronic back pain Referred to the pain clinic last month and she has been  given the number to [facility] to call them to follow up an appointment. I have informed her that this would be the last prescription of oxycodone she will be receiving from me. - oxyCODONE (ROXICODONE) 5 MG immediate release tablet; Take 1 tablet (5 mg total) by mouth every 12 (twelve) hours as needed for severe pain.  Dispense: 60 tablet; Refill: 0  4. Tachycardia Might need to add Metoprolol at next visit Will also need to evaluate for the need to see Cardiology especially in the setting of a history of Afib. Meds ordered this encounter  Medications  . amLODipine (NORVASC) 10 MG tablet    Sig: Take 1 tablet (10 mg total) by mouth daily.    Dispense:  30 tablet    Refill:  3  . oxyCODONE (ROXICODONE) 5 MG immediate release tablet    Sig: Take 1 tablet (5 mg total) by mouth every 12 (twelve) hours as needed for severe pain.    Dispense:  60 tablet    Refill:  0    Follow-up: Return in about 6 weeks (around 02/03/2016) for follow up of hypertension.   Arnoldo Morale MD

## 2015-12-26 ENCOUNTER — Telehealth: Payer: Self-pay

## 2015-12-26 ENCOUNTER — Telehealth: Payer: Self-pay | Admitting: *Deleted

## 2015-12-26 ENCOUNTER — Ambulatory Visit: Payer: Medicare Other | Admitting: Family Medicine

## 2015-12-26 NOTE — Telephone Encounter (Signed)
Call received from the patient requesting that "papers be faxed to the pain clinic."  She said that she tried to schedule an appointment at [facility] but they would not give her an an appointment until they received the " papers." Patient confirmed her phone # [phone]. She said that she had seen Dr Jarold Song.  Message given the Kizzie Bane, RN

## 2015-12-26 NOTE — Telephone Encounter (Signed)
Patient states [facility] needs last office visit notes in order to consider her for treatment.  Fax paperwork to [employer]

## 2015-12-27 ENCOUNTER — Telehealth: Payer: Self-pay | Admitting: Family Medicine

## 2015-12-27 NOTE — Telephone Encounter (Signed)
Notes faxed yesterday. Told patient I would follow up and let her know.

## 2015-12-27 NOTE — Telephone Encounter (Signed)
Patient called stating that she was referred to the pain clinic. Patient stated the the pain clinic would not schedule her an appointment until progress notes from office visit is sent. Please follow up.

## 2015-12-27 NOTE — Telephone Encounter (Signed)
Called [facility]-they did not have patients info that this RN faxed yesterday.  This RN got fax confirmation yesterday but [facility] states they don't have it.  He gave another fax number to the main office.  Info faxed again and confirmation received.  Patient aware.

## 2016-01-13 DIAGNOSIS — M5416 Radiculopathy, lumbar region: Secondary | ICD-10-CM | POA: Diagnosis not present

## 2016-01-13 DIAGNOSIS — G603 Idiopathic progressive neuropathy: Secondary | ICD-10-CM | POA: Diagnosis not present

## 2016-01-13 DIAGNOSIS — M5432 Sciatica, left side: Secondary | ICD-10-CM | POA: Diagnosis not present

## 2016-01-13 DIAGNOSIS — M5431 Sciatica, right side: Secondary | ICD-10-CM | POA: Diagnosis not present

## 2016-01-13 DIAGNOSIS — G541 Lumbosacral plexus disorders: Secondary | ICD-10-CM | POA: Diagnosis not present

## 2016-01-13 DIAGNOSIS — M5442 Lumbago with sciatica, left side: Secondary | ICD-10-CM | POA: Diagnosis not present

## 2016-01-13 DIAGNOSIS — G894 Chronic pain syndrome: Secondary | ICD-10-CM | POA: Diagnosis not present

## 2016-01-13 DIAGNOSIS — M545 Low back pain: Secondary | ICD-10-CM | POA: Diagnosis not present

## 2016-02-02 DIAGNOSIS — M5416 Radiculopathy, lumbar region: Secondary | ICD-10-CM | POA: Diagnosis not present

## 2016-02-02 DIAGNOSIS — M5442 Lumbago with sciatica, left side: Secondary | ICD-10-CM | POA: Diagnosis not present

## 2016-02-02 DIAGNOSIS — G894 Chronic pain syndrome: Secondary | ICD-10-CM | POA: Diagnosis not present

## 2016-02-02 DIAGNOSIS — M545 Low back pain: Secondary | ICD-10-CM | POA: Diagnosis not present

## 2016-02-03 ENCOUNTER — Ambulatory Visit: Payer: Medicare Other | Admitting: Family Medicine

## 2016-02-08 ENCOUNTER — Ambulatory Visit (HOSPITAL_BASED_OUTPATIENT_CLINIC_OR_DEPARTMENT_OTHER): Payer: Medicare Other

## 2016-02-08 ENCOUNTER — Telehealth: Payer: Self-pay | Admitting: *Deleted

## 2016-02-08 ENCOUNTER — Other Ambulatory Visit: Payer: Self-pay | Admitting: *Deleted

## 2016-02-08 VITALS — BP 121/67 | HR 98 | Temp 99.0°F | Resp 18

## 2016-02-08 DIAGNOSIS — C9001 Multiple myeloma in remission: Secondary | ICD-10-CM

## 2016-02-08 DIAGNOSIS — C9000 Multiple myeloma not having achieved remission: Secondary | ICD-10-CM | POA: Diagnosis present

## 2016-02-08 LAB — COMPREHENSIVE METABOLIC PANEL
ALBUMIN: 3.6 g/dL (ref 3.5–5.0)
ALK PHOS: 80 U/L (ref 40–150)
ALT: 23 U/L (ref 0–55)
ANION GAP: 12 meq/L — AB (ref 3–11)
AST: 39 U/L — AB (ref 5–34)
BUN: 8.7 mg/dL (ref 7.0–26.0)
CALCIUM: 9.3 mg/dL (ref 8.4–10.4)
CO2: 25 mEq/L (ref 22–29)
Chloride: 102 mEq/L (ref 98–109)
Creatinine: 0.8 mg/dL (ref 0.6–1.1)
EGFR: 87 mL/min/{1.73_m2} — AB (ref >90)
Glucose: 92 mg/dl (ref 70–140)
POTASSIUM: 3.1 meq/L — AB (ref 3.5–5.1)
Sodium: 139 mEq/L (ref 136–145)
Total Bilirubin: 1.07 mg/dL (ref 0.20–1.20)
Total Protein: 7.4 g/dL (ref 6.4–8.3)

## 2016-02-08 MED ORDER — POTASSIUM CHLORIDE CRYS ER 20 MEQ PO TBCR: 20.0000 meq | EXTENDED_RELEASE_TABLET | Freq: Once | ORAL | Status: DC

## 2016-02-08 MED ORDER — ZOLEDRONIC ACID 4 MG/100ML IV SOLN
4.0000 mg | Freq: Once | INTRAVENOUS | Status: AC
  Administered 2016-02-08: 4 mg via INTRAVENOUS
  Filled 2016-02-08: qty 100

## 2016-02-08 MED ORDER — SODIUM CHLORIDE 0.9 % IV SOLN
Freq: Once | INTRAVENOUS | Status: AC
  Administered 2016-02-08: 15:00:00 via INTRAVENOUS

## 2016-02-08 NOTE — Patient Instructions (Signed)

## 2016-02-08 NOTE — Telephone Encounter (Signed)
-----   Message from Curt Bears, MD sent at 02/08/2016  3:46 PM EDT ----- Call patient with the result and order K Dur 20 meq po qd X 7 days.

## 2016-02-08 NOTE — Telephone Encounter (Signed)
Unable to reach pt. LMOVM with results K+ 3.1. Gave instructions to pick up  Rx. Call with any concerns.

## 2016-02-08 NOTE — Progress Notes (Signed)
Quick Note:  Call patient with the result and order K Dur 20 meq po qd X 7 days ______ 

## 2016-02-08 NOTE — Telephone Encounter (Signed)
Dr. Julien Nordmann notified of K-3.1.  Order received for pt to take KCL 20 meq x 7 days.  Prescription sent in and pt instructed of Kcl orders and verbalizes an understanding of information.

## 2016-03-01 ENCOUNTER — Other Ambulatory Visit: Payer: Self-pay | Admitting: Family Medicine

## 2016-03-01 DIAGNOSIS — G894 Chronic pain syndrome: Secondary | ICD-10-CM | POA: Diagnosis not present

## 2016-03-01 DIAGNOSIS — M545 Low back pain: Secondary | ICD-10-CM | POA: Diagnosis not present

## 2016-03-01 DIAGNOSIS — M79605 Pain in left leg: Secondary | ICD-10-CM | POA: Diagnosis not present

## 2016-03-01 DIAGNOSIS — M25552 Pain in left hip: Secondary | ICD-10-CM | POA: Diagnosis not present

## 2016-03-01 DIAGNOSIS — M79662 Pain in left lower leg: Secondary | ICD-10-CM | POA: Diagnosis not present

## 2016-03-01 DIAGNOSIS — Z79891 Long term (current) use of opiate analgesic: Secondary | ICD-10-CM | POA: Diagnosis not present

## 2016-03-08 ENCOUNTER — Telehealth: Payer: Self-pay

## 2016-03-08 NOTE — Telephone Encounter (Signed)
Call was placed to patient, patient verified name and DOB. Patient was inform that her rx for Lomotil will be faxed to her [facility] since our Pharmacy at [facility] didn't carry that medication. Patient verbalize understanding with no further questions.

## 2016-03-29 DIAGNOSIS — M79605 Pain in left leg: Secondary | ICD-10-CM | POA: Diagnosis not present

## 2016-03-29 DIAGNOSIS — Z79891 Long term (current) use of opiate analgesic: Secondary | ICD-10-CM | POA: Diagnosis not present

## 2016-03-29 DIAGNOSIS — M545 Low back pain: Secondary | ICD-10-CM | POA: Diagnosis not present

## 2016-03-29 DIAGNOSIS — M5431 Sciatica, right side: Secondary | ICD-10-CM | POA: Diagnosis not present

## 2016-03-29 DIAGNOSIS — M5432 Sciatica, left side: Secondary | ICD-10-CM | POA: Diagnosis not present

## 2016-03-29 DIAGNOSIS — M25552 Pain in left hip: Secondary | ICD-10-CM | POA: Diagnosis not present

## 2016-03-29 DIAGNOSIS — G894 Chronic pain syndrome: Secondary | ICD-10-CM | POA: Diagnosis not present

## 2016-04-04 ENCOUNTER — Telehealth: Payer: Self-pay | Admitting: Internal Medicine

## 2016-04-04 NOTE — Telephone Encounter (Signed)
Returned call adn s.w. Pt and confirmed appt....the patient ok and aware

## 2016-04-17 ENCOUNTER — Telehealth: Payer: Self-pay | Admitting: Medical Oncology

## 2016-04-17 ENCOUNTER — Encounter (HOSPITAL_COMMUNITY): Payer: Self-pay | Admitting: Emergency Medicine

## 2016-04-17 ENCOUNTER — Emergency Department (HOSPITAL_COMMUNITY)
Admission: EM | Admit: 2016-04-17 | Discharge: 2016-04-17 | Disposition: A | Discharge status: Home / Self Care | Payer: Medicare Other | Attending: Emergency Medicine | Admitting: Emergency Medicine

## 2016-04-17 DIAGNOSIS — I10 Essential (primary) hypertension: Secondary | ICD-10-CM | POA: Diagnosis not present

## 2016-04-17 DIAGNOSIS — J45909 Unspecified asthma, uncomplicated: Secondary | ICD-10-CM | POA: Diagnosis not present

## 2016-04-17 DIAGNOSIS — Z859 Personal history of malignant neoplasm, unspecified: Secondary | ICD-10-CM | POA: Insufficient documentation

## 2016-04-17 DIAGNOSIS — R0602 Shortness of breath: Secondary | ICD-10-CM | POA: Diagnosis not present

## 2016-04-17 DIAGNOSIS — Z79899 Other long term (current) drug therapy: Secondary | ICD-10-CM | POA: Diagnosis not present

## 2016-04-17 DIAGNOSIS — R030 Elevated blood-pressure reading, without diagnosis of hypertension: Secondary | ICD-10-CM | POA: Diagnosis not present

## 2016-04-17 DIAGNOSIS — M1990 Unspecified osteoarthritis, unspecified site: Secondary | ICD-10-CM | POA: Diagnosis not present

## 2016-04-17 DIAGNOSIS — F419 Anxiety disorder, unspecified: Secondary | ICD-10-CM | POA: Diagnosis not present

## 2016-04-17 NOTE — ED Notes (Signed)
MD at bedside. 
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
## 2016-04-17 NOTE — ED Notes (Signed)
Verbalized understanding discharge instructions and follow-up. In no acute distress.   

## 2016-04-17 NOTE — Telephone Encounter (Signed)
Husband presented to cancer center lobby. He told me his wife is in ED and asked me what is wrong with her. I told him there is nothing I can tell him. He said "I guess I will go on home and wait for her".

## 2016-04-17 NOTE — ED Provider Notes (Signed)
CSN: [id number]     Arrival date & time 04/17/16  1324 History   First MD Initiated Contact with Patient 04/17/16 1600     Chief Complaint  Patient presents with  . Anxiety     (Consider location/radiation/quality/duration/timing/severity/associated sxs/prior Treatment) HPI Comments: Patient is a 70 year old female with a history of asthma, hypertension and multiple myeloma presenting today after getting short of breath while sweeping the floor. She states that just started to cause her to wheeze and cough which improved after she used her inhaler. However at it then returned it which required her to use her inhaler a second time but now she feels normal. She denies any chest pain during any of this time or currently. She denies any unilateral leg pain or swelling. She walked to the bathroom since being here and states she felt fine.  She says the last time she had her blood work checked everything was normal and she is due to see Dr. Earlie Server in the next month. She also admits to running out of her blood pressure medication a proximally one month ago but was planning on getting it today.  Patient is a 70 y.o. female presenting with shortness of breath. The history is provided by the patient.  Shortness of Breath Severity:  Moderate Onset quality:  Sudden Duration:  30 minutes Timing:  Constant Progression:  Resolved Chronicity:  New Context: activity   Relieved by:  Inhaler Worsened by:  Nothing tried Ineffective treatments:  None tried Associated symptoms: wheezing   Associated symptoms: no abdominal pain, no chest pain, no cough, no fever and no sputum production   Risk factors: no prolonged immobilization and no tobacco use   Risk factors comment:  Hx of asthma and Multiple myeloma   Past Medical History  Diagnosis Date  . Cancer ([place])   . Hypertension   . Asthma   . Arthritis    Past Surgical History  Procedure Laterality Date  . Appendectomy    . Tubal ligation    .  Cholecystectomy    . Esophagogastroduodenoscopy N/A 11/24/2015    Procedure: ESOPHAGOGASTRODUODENOSCOPY (EGD);  Surgeon: Clarene Essex, MD;  Location: [facility];  Service: Endoscopy;  Laterality: N/A;   Family History  Problem Relation Age of Onset  . Family history unknown: Yes   Social History  Substance Use Topics  . Smoking status: Never Smoker   . Smokeless tobacco: Never Used  . Alcohol Use: No   OB History    No data available     Review of Systems  Constitutional: Negative for fever.  Respiratory: Positive for shortness of breath and wheezing. Negative for cough and sputum production.   Cardiovascular: Negative for chest pain.  Gastrointestinal: Negative for abdominal pain.  All other systems reviewed and are negative.     Allergies  Gabapentin and Aspirin  Home Medications   Prior to Admission medications   Medication Sig Start Date End Date Taking? Authorizing Provider  acetaminophen (TYLENOL) 500 MG tablet Take 500 mg by mouth every 6 (six) hours as needed for mild pain, moderate pain, fever or headache.   Yes Historical Provider, MD  albuterol (PROVENTIL HFA;VENTOLIN HFA) 108 (90 BASE) MCG/ACT inhaler Inhale 2 puffs into the lungs every 6 (six) hours as needed for wheezing or shortness of breath. 07/06/15  Yes Arnoldo Morale, MD  amLODipine (NORVASC) 10 MG tablet Take 1 tablet (10 mg total) by mouth daily. 12/23/15  Yes Arnoldo Morale, MD  carisoprodol (SOMA) 350 MG tablet Take 1  tablet (350 mg total) by mouth 2 (two) times daily as needed for muscle spasms. Patient not taking: Reported on 04/17/2016 11/28/15   Arnoldo Morale, MD  diphenoxylate-atropine (LOMOTIL) 2.5-0.025 MG tablet TAKE 1 TABLET BY MOUTH 4 TIMES DAILY AS NEEDED FOR DIARRHEA OR LOOSE STOOLS Patient not taking: Reported on 04/17/2016 03/02/16   Arnoldo Morale, MD  omeprazole (PRILOSEC) 20 MG capsule Take 1 capsule (20 mg total) by mouth daily. Patient not taking: Reported on 04/17/2016 11/24/15   Ezequiel Essex, MD   oxyCODONE (ROXICODONE) 5 MG immediate release tablet Take 1 tablet (5 mg total) by mouth every 12 (twelve) hours as needed for severe pain. Patient not taking: Reported on 04/17/2016 12/23/15   Arnoldo Morale, MD  potassium chloride SA (K-DUR,KLOR-CON) 20 MEQ tablet Take 1 tablet (20 mEq total) by mouth once. Patient not taking: Reported on 04/17/2016 02/08/16   Curt Bears, MD   BP 166/113 mmHg  Pulse 103  Temp(Src) 98.9 F (37.2 C) (Oral)  Resp 20  SpO2 100% Physical Exam  Constitutional: She is oriented to person, place, and time. She appears well-developed and well-nourished. No distress.  HENT:  Head: Normocephalic and atraumatic.  Mouth/Throat: Oropharynx is clear and moist.  Eyes: Conjunctivae and EOM are normal. Pupils are equal, round, and reactive to light.  Neck: Normal range of motion. Neck supple.  Cardiovascular: Regular rhythm and intact distal pulses.  Tachycardia present.   No murmur heard. Pulmonary/Chest: Effort normal and breath sounds normal. No respiratory distress. She has no wheezes. She has no rales.  Abdominal: Soft. She exhibits no distension. There is no tenderness. There is no rebound and no guarding.  Musculoskeletal: Normal range of motion. She exhibits no edema or tenderness.  Neurological: She is alert and oriented to person, place, and time.  Skin: Skin is warm and dry. No rash noted. No erythema.  Psychiatric: She has a normal mood and affect. Her behavior is normal.  Nursing note and vitals reviewed.   ED Course  Procedures (including critical care time) Labs Review Labs Reviewed - No data to display  Imaging Review No results found. I have personally reviewed and evaluated these images and lab results as part of my medical decision-making.   EKG Interpretation None      MDM   Final diagnoses:  SOB (shortness of breath)    Patient is a 70 year old female presenting today after becoming short of breath while sweeping. She states the  dentist caused her to start wheezing and coughing. The shortness of breath resolved after she used her inhaler. She denied any chest pain during any of this and states she has not recently been short of breath or had chest pain. She does have multiple myeloma which is getting treated by Dr. Earlie Server. She states she is almost due for a treatment. She also has run out of her blood pressure medicine in the last month but was planning on getting it filled today. She was able to walk to the bathroom and back without being short of breath. She is well-appearing with a normal exam except for mild tachycardia.  Patient is currently requesting to go home and does not want any further imaging or testing at this time. Feel that she is reasonably cleared. Did discuss with her if she starts to develop shortness of breath on exertion, any chest pain or lower extremity edema she needed to return immediately.    Blanchie Dessert, MD 04/17/16 1739

## 2016-04-17 NOTE — Discharge Instructions (Signed)
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
## 2016-05-09 ENCOUNTER — Other Ambulatory Visit (HOSPITAL_BASED_OUTPATIENT_CLINIC_OR_DEPARTMENT_OTHER): Payer: Medicare Other

## 2016-05-09 DIAGNOSIS — C9000 Multiple myeloma not having achieved remission: Secondary | ICD-10-CM | POA: Diagnosis present

## 2016-05-09 DIAGNOSIS — C9001 Multiple myeloma in remission: Secondary | ICD-10-CM | POA: Diagnosis not present

## 2016-05-09 LAB — CBC WITH DIFFERENTIAL/PLATELET
BASO%: 1 % (ref 0.0–2.0)
Basophils Absolute: 0.1 10*3/uL (ref 0.0–0.1)
EOS%: 0.4 % (ref 0.0–7.0)
Eosinophils Absolute: 0 10*3/uL (ref 0.0–0.5)
HCT: 38.6 % (ref 34.8–46.6)
HGB: 12.6 g/dL (ref 11.6–15.9)
LYMPH%: 29.6 % (ref 14.0–49.7)
MCH: 29.6 pg (ref 25.1–34.0)
MCHC: 32.7 g/dL (ref 31.5–36.0)
MCV: 90.6 fL (ref 79.5–101.0)
MONO#: 0.9 10*3/uL (ref 0.1–0.9)
MONO%: 9.7 % (ref 0.0–14.0)
NEUT%: 59.3 % (ref 38.4–76.8)
NEUTROS ABS: 5.5 10*3/uL (ref 1.5–6.5)
PLATELETS: 188 10*3/uL (ref 145–400)
RBC: 4.26 10*6/uL (ref 3.70–5.45)
RDW: 14.2 % (ref 11.2–14.5)
WBC: 9.2 10*3/uL (ref 3.9–10.3)
lymph#: 2.7 10*3/uL (ref 0.9–3.3)

## 2016-05-09 LAB — COMPREHENSIVE METABOLIC PANEL
ALT: 15 U/L (ref 0–55)
ANION GAP: 11 meq/L (ref 3–11)
AST: 20 U/L (ref 5–34)
Albumin: 3.7 g/dL (ref 3.5–5.0)
Alkaline Phosphatase: 107 U/L (ref 40–150)
BILIRUBIN TOTAL: 0.66 mg/dL (ref 0.20–1.20)
BUN: 11.7 mg/dL (ref 7.0–26.0)
CO2: 27 meq/L (ref 22–29)
CREATININE: 0.8 mg/dL (ref 0.6–1.1)
Calcium: 9.3 mg/dL (ref 8.4–10.4)
Chloride: 105 mEq/L (ref 98–109)
EGFR: >90 mL/min/{1.73_m2} (ref >90)
GLUCOSE: 78 mg/dL (ref 70–140)
Potassium: 3.3 mEq/L — ABNORMAL LOW (ref 3.5–5.1)
SODIUM: 142 meq/L (ref 136–145)
TOTAL PROTEIN: 7.6 g/dL (ref 6.4–8.3)

## 2016-05-09 LAB — LACTATE DEHYDROGENASE: LDH: 223 U/L (ref 125–245)

## 2016-05-10 LAB — IGG, IGA, IGM
IGA/IMMUNOGLOBULIN A, SERUM: 186 mg/dL (ref 64–422)
IgM, Qn, Serum: 153 mg/dL (ref 26–217)

## 2016-05-10 LAB — KAPPA/LAMBDA LIGHT CHAINS
IG KAPPA FREE LIGHT CHAIN: 14.4 mg/L (ref 3.3–19.4)
IG LAMBDA FREE LIGHT CHAIN: 17.6 mg/L (ref 5.7–26.3)
Kappa/Lambda FluidC Ratio: 0.82 (ref 0.26–1.65)

## 2016-05-10 LAB — BETA 2 MICROGLOBULIN, SERUM: BETA 2: 1.8 mg/L (ref 0.6–2.4)

## 2016-05-16 ENCOUNTER — Telehealth: Payer: Self-pay | Admitting: Internal Medicine

## 2016-05-16 ENCOUNTER — Ambulatory Visit (HOSPITAL_BASED_OUTPATIENT_CLINIC_OR_DEPARTMENT_OTHER): Payer: Medicare Other

## 2016-05-16 ENCOUNTER — Encounter: Payer: Self-pay | Admitting: Internal Medicine

## 2016-05-16 ENCOUNTER — Ambulatory Visit (HOSPITAL_BASED_OUTPATIENT_CLINIC_OR_DEPARTMENT_OTHER): Payer: Medicare Other | Admitting: Internal Medicine

## 2016-05-16 VITALS — BP 150/87 | HR 86 | Temp 98.9°F | Resp 18 | Ht 61.0 in | Wt 154.8 lb

## 2016-05-16 DIAGNOSIS — C9001 Multiple myeloma in remission: Secondary | ICD-10-CM

## 2016-05-16 MED ORDER — ZOLEDRONIC ACID 4 MG/100ML IV SOLN
4.0000 mg | Freq: Once | INTRAVENOUS | Status: AC
  Administered 2016-05-16: 4 mg via INTRAVENOUS
  Filled 2016-05-16: qty 100

## 2016-05-16 NOTE — Progress Notes (Signed)
Ruth [phone]   [phone]  OFFICE PROGRESS NOTE  Arnoldo Morale, MD [place] [address]  DIAGNOSIS: Multiple myeloma, IgG kappa subtype diagnosed in October 2008.   PRIOR THERAPY:  1. Status post radiotherapy to the lower lumbar spine and left pelvic area under the care of Dr. Sondra Come. The patient received a total dose of 3000 cGy. in 15 fractions between 10/14/2007 through 11/07/2007  2.Status post 6 cycles of systemic chemotherapy with Revlimid and Decadron. Last dose given April 2009.  3. status post autologous peripheral blood stem cell transplant on 04/01/2008 under the care of Dr. Valarie Merino at [facility].   CURRENT THERAPY: Zometa 4 mg IV given every 3 months.  INTERVAL HISTORY: Ruth Sanchez 70 y.o. female returns to the clinic today for routine six-month followup visit. She is feeling fine today with no specific complaints except for the persistent low back pain and muscle spasm. She is currently taking extra strength Tylenol 3-4 times daily. She has not been to the pain clinic recently because of copayment every 2 weeks. She denied having any nausea or vomiting. She denied having any significant weight loss or night sweats. She denied having any chest pain, shortness of breath with exertion and no cough or hemoptysis. She had repeat myeloma panel performed recently and she is here for evaluation and discussion of her lab results.  MEDICAL HISTORY: Past Medical History:  Diagnosis Date  . Arthritis   . Asthma   . Cancer ([place])   . Hypertension     ALLERGIES:  is allergic to gabapentin and aspirin.  MEDICATIONS:  Current Outpatient Prescriptions  Medication Sig Dispense Refill  . acetaminophen (TYLENOL) 500 MG tablet Take 500 mg by mouth every 6 (six) hours as needed for mild pain, moderate pain, fever or headache.    Marland Kitchen amLODipine (NORVASC) 10 MG tablet Take 1 tablet (10 mg total) by mouth daily.  30 tablet 3   No current facility-administered medications for this visit.     SURGICAL HISTORY:  Past Surgical History:  Procedure Laterality Date  . APPENDECTOMY    . CHOLECYSTECTOMY    . ESOPHAGOGASTRODUODENOSCOPY N/A 11/24/2015   Procedure: ESOPHAGOGASTRODUODENOSCOPY (EGD);  Surgeon: Clarene Essex, MD;  Location: [facility];  Service: Endoscopy;  Laterality: N/A;  . TUBAL LIGATION      REVIEW OF SYSTEMS:  A comprehensive review of systems was negative except for: Musculoskeletal: positive for back pain   PHYSICAL EXAMINATION: General appearance: alert, cooperative, fatigued and no distress Head: Normocephalic, without obvious abnormality, atraumatic Neck: no adenopathy Lymph nodes: Cervical, supraclavicular, and axillary nodes normal. Resp: clear to auscultation bilaterally Cardio: regular rate and rhythm, S1, S2 normal, no murmur, click, rub or gallop GI: soft, non-tender; bowel sounds normal; no masses,  no organomegaly Extremities: extremities normal, atraumatic, no cyanosis or edema   ECOG PERFORMANCE STATUS: 1 - Symptomatic but completely ambulatory  Blood pressure (!) 150/87, pulse 86, temperature 98.9 F (37.2 C), temperature source Oral, resp. rate 18, height '5\' 1"'  (1.549 m), weight 154 lb 12.8 oz (70.2 kg), SpO2 99 %.  LABORATORY DATA: Lab Results  Component Value Date   WBC 9.2 05/09/2016   HGB 12.6 05/09/2016   HCT 38.6 05/09/2016   MCV 90.6 05/09/2016   PLT 188 05/09/2016      Chemistry      Component Value Date/Time   NA 142 05/09/2016 1342   K 3.3 (L) 05/09/2016 1342  CL 100 11/30/2015 1510   CL 104 01/19/2013 1058   CO2 27 05/09/2016 1342   BUN 11.7 05/09/2016 1342   CREATININE 0.8 05/09/2016 1342      Component Value Date/Time   CALCIUM 9.3 05/09/2016 1342   ALKPHOS 107 05/09/2016 1342   AST 20 05/09/2016 1342   ALT 15 05/09/2016 1342   BILITOT 0.66 05/09/2016 1342     Other lab results: Beta-2 microglobulin 1.8, free kappa light chain  14.4, free lambda light chain 17.6 with a kappa/lambda ratio 0.82, IgG 1146, IgA 186 and IgM 153.  RADIOGRAPHIC STUDIES: No results found.  ASSESSMENT AND PLAN: This is a very pleasant 70 years old African American female with history of multiple myeloma status post systemic chemotherapy with Revlimid and Decadron followed by this peripheral blood stem cell transplant and has been observation since June of 2009 with no significant evidence for disease progression.  The recent Myeloma panel showed no concerning abnormalities. I discussed the lab result with the patient today and recommended for her to continue on observation with repeat myeloma panel in 6 months.  She will continue her treatment with Zometa every 3 months. She was advised to call immediately if she has any concerning symptoms in the interval.  All questions were answered. The patient knows to call the clinic with any problems, questions or concerns. We can certainly see the patient much sooner if necessary.  Disclaimer: This note was dictated with voice recognition software. Similar sounding words can inadvertently be transcribed and may not be corrected upon review.

## 2016-05-16 NOTE — Patient Instructions (Signed)

## 2016-05-16 NOTE — Telephone Encounter (Signed)
PT WILL P/U UPDATED SCHED IN [place]

## 2016-06-06 ENCOUNTER — Other Ambulatory Visit: Payer: Self-pay | Admitting: Family Medicine

## 2016-06-06 DIAGNOSIS — I10 Essential (primary) hypertension: Secondary | ICD-10-CM

## 2016-07-09 ENCOUNTER — Emergency Department (HOSPITAL_COMMUNITY): Payer: Medicare Other

## 2016-07-09 ENCOUNTER — Emergency Department (HOSPITAL_COMMUNITY)
Admission: EM | Admit: 2016-07-09 | Discharge: 2016-07-09 | Disposition: A | Discharge status: Home / Self Care | Payer: Medicare Other | Attending: Emergency Medicine | Admitting: Emergency Medicine

## 2016-07-09 ENCOUNTER — Encounter (HOSPITAL_COMMUNITY): Payer: Self-pay | Admitting: Emergency Medicine

## 2016-07-09 DIAGNOSIS — Z85830 Personal history of malignant neoplasm of bone: Secondary | ICD-10-CM | POA: Insufficient documentation

## 2016-07-09 DIAGNOSIS — M545 Low back pain, unspecified: Secondary | ICD-10-CM

## 2016-07-09 DIAGNOSIS — J45909 Unspecified asthma, uncomplicated: Secondary | ICD-10-CM | POA: Diagnosis not present

## 2016-07-09 DIAGNOSIS — Z79899 Other long term (current) drug therapy: Secondary | ICD-10-CM | POA: Insufficient documentation

## 2016-07-09 DIAGNOSIS — R0781 Pleurodynia: Secondary | ICD-10-CM | POA: Insufficient documentation

## 2016-07-09 DIAGNOSIS — R0602 Shortness of breath: Secondary | ICD-10-CM | POA: Diagnosis not present

## 2016-07-09 DIAGNOSIS — I10 Essential (primary) hypertension: Secondary | ICD-10-CM | POA: Insufficient documentation

## 2016-07-09 DIAGNOSIS — M549 Dorsalgia, unspecified: Secondary | ICD-10-CM | POA: Diagnosis present

## 2016-07-09 MED ORDER — OXYCODONE HCL 5 MG PO TABS
10.0000 mg | ORAL_TABLET | Freq: Once | ORAL | Status: AC
Start: 2016-07-09 — End: 2016-07-09
  Administered 2016-07-09: 10 mg via ORAL
  Filled 2016-07-09: qty 2

## 2016-07-09 MED ORDER — OXYCODONE HCL 10 MG PO TABS: 5.0000 mg | ORAL_TABLET | Freq: Four times a day (QID) | ORAL | 0 refills | Status: DC | PRN

## 2016-07-09 NOTE — ED Provider Notes (Signed)
[facility] DEPT Provider Note   CSN: [id number] Arrival date & time: 07/09/16  1658     History   Chief Complaint Chief Complaint  Patient presents with  . Back Pain  . bone cancer    HPI Ruth Sanchez is a 70 y.o. female who presents for back pain. She has a PMH of  "bone cancer." EMR says multiple myeloma. ]She has had radiotherapy to the lumbar region and pelvis. She has not recently been to the Pain management clinic due to cost of copay. She c/o acute exacerbation of her chronic back pain and has been taking tylenol without relief. She has been helping to lift her husband at home which she feels has made her pain worse. Denies weakness, loss of bowel/bladder function or saddle anesthesia. Denies neck stiffness, headache, rash.  Denies fever or recent procedures to back.   HPI  Past Medical History:  Diagnosis Date  . Arthritis   . Asthma   . Cancer ([place])   . Hypertension     Patient Active Problem List   Diagnosis Date Noted  . Urinary incontinence 08/03/2015  . Essential hypertension 05/13/2015  . Chronic back pain 05/13/2015  . Acute bronchitis 02/23/2013  . Dyspnea 02/23/2013  . Atrial fibrillation with RVR ([place]) 02/23/2013  . Arthritis 02/23/2013  . Goiter with tracheal compression and mild stridor 02/23/2013  . Asthma, chronic 02/23/2013  . Hypokalemia 02/23/2013  . Multiple myeloma in remission ([place]) 11/18/2011    Past Surgical History:  Procedure Laterality Date  . APPENDECTOMY    . CHOLECYSTECTOMY    . ESOPHAGOGASTRODUODENOSCOPY N/A 11/24/2015   Procedure: ESOPHAGOGASTRODUODENOSCOPY (EGD);  Surgeon: Clarene Essex, MD;  Location: [facility];  Service: Endoscopy;  Laterality: N/A;  . TUBAL LIGATION      OB History    No data available       Home Medications    Prior to Admission medications   Medication Sig Start Date End Date Taking? Authorizing Provider  acetaminophen (TYLENOL) 500 MG tablet Take 1,000 mg by mouth every 6 (six) hours as  needed for mild pain, moderate pain, fever or headache.    Yes Historical Provider, MD  amLODipine (NORVASC) 10 MG tablet TAKE 1 TABLET BY MOUTH EVERY DAY Patient taking differently: TAKE 1 TABLET BY MOUTH EVERY OTHER DAY 06/06/16  Yes Arnoldo Morale, MD  Oxycodone HCl 10 MG TABS Take 0.5-1 tablets (5-10 mg total) by mouth every 6 (six) hours as needed for severe pain. 07/09/16   Margarita Mail, PA-C    Family History Family History  Problem Relation Age of Onset  . Family history unknown: Yes    Social History Social History  Substance Use Topics  . Smoking status: Never Smoker  . Smokeless tobacco: Never Used  . Alcohol use No     Allergies   Gabapentin and Aspirin   Review of Systems Review of Systems Ten systems reviewed and are negative for acute change, except as noted in the HPI.   Physical Exam Updated Vital Signs BP 136/74 (BP Location: Left Arm)   Pulse 74   Temp 98.3 F (36.8 C) (Oral)   Resp 15   SpO2 100%   Physical Exam  Constitutional: She is oriented to person, place, and time. She appears well-developed and well-nourished. No distress.  HENT:  Head: Normocephalic and atraumatic.  Eyes: Conjunctivae are normal. No scleral icterus.  Neck: Normal range of motion.  Cardiovascular: Normal rate, regular rhythm and normal heart sounds.  Exam reveals no  gallop and no friction rub.   No murmur heard. Pulmonary/Chest: Effort normal and breath sounds normal. No respiratory distress.  Abdominal: Soft. Bowel sounds are normal. She exhibits no distension and no mass. There is no tenderness. There is no guarding.  Musculoskeletal:  Patient appears to be in mild to moderate pain, antalgic gait noted. Lumbosacral spine area reveals no local tenderness or mass.  Painful and reduced LS ROM noted. Straight leg raise is negative. DTR's, motor strength and sensation normal, including heel and toe gait.  Peripheral pulses are palpable. X-Ray: NO FRACTURES, MASSES, OR ACUTE  CHANGES.    Neurological: She is alert and oriented to person, place, and time.  Skin: Skin is warm and dry. She is not diaphoretic.  Nursing note and vitals reviewed.    ED Treatments / Results  Labs (all labs ordered are listed, but only abnormal results are displayed) Labs Reviewed - No data to display  EKG  EKG Interpretation None       Radiology Dg Chest 2 View  Result Date: 07/09/2016 CLINICAL DATA:  Shortness of breath.  Multiple myeloma. EXAM: CHEST  2 VIEW COMPARISON:  Chest radiograph and chest CT November 24, 2015 FINDINGS: There is mild scarring in the bases. There is no edema or consolidation. There is stable cardiomegaly. The pulmonary vascularity is within normal limits. There is no adenopathy evident. The enlargement of the left lobe of the thyroid seen on prior CT is not appreciable by radiography. There is degenerative change throughout the thoracic spine with mild anterior wedging of several thoracic vertebral bodies, stable. There is degenerative change throughout the thoracic spine and both shoulders. The bones are somewhat osteoporotic. In the thoracic region, areas of relative lucency are noted at multiple sites, similar to prior CT. IMPRESSION: Bony changes concerning for a known multiple myeloma, not appreciably progressed from prior CT. Bibasilar lung scarring. No edema or consolidation. Stable cardiomegaly. No adenopathy evident. Electronically Signed   By: Lowella Grip III M.D.   On: 07/09/2016 18:52   Dg Lumbar Spine Complete  Result Date: 07/09/2016 CLINICAL DATA:  Back pain.  History of cancer EXAM: LUMBAR SPINE - COMPLETE 4+ VIEW COMPARISON:  03/30/2015 FINDINGS: Negative for fracture.  No mass lesion Normal alignment. Multilevel disc degeneration and anterior spurring. Degenerative change in the SI joints bilaterally. Calcified uterine fibroid unchanged. Degenerative change in the right hip joint. IMPRESSION: Negative for fracture or mass in the lumbar  spine. Chronic degenerative changes again noted. Electronically Signed   By: Franchot Gallo M.D.   On: 07/09/2016 18:51    Procedures Procedures (including critical care time)  Medications Ordered in ED Medications  oxyCODONE (Oxy IR/ROXICODONE) immediate release tablet 10 mg (10 mg Oral Given 07/09/16 1851)     Initial Impression / Assessment and Plan / ED Course  I have reviewed the triage vital signs and the nursing notes.  Pertinent labs & imaging results that were available during my care of the patient were reviewed by me and considered in my medical decision making (see chart for details).  Clinical Course   Patient with back pain.  No neurological deficits and normal neuro exam.  Patient can walk but states is painful.  No loss of bowel or bladder control.  No concern for cauda equina.  No fever, night sweats, weight loss, h/o cancer, IVDU.  RICE protocol and pain medicine indicated and discussed with patient.    Final Clinical Impressions(s) / ED Diagnoses   Final diagnoses:  Rib pain  on left side  Midline low back pain without sciatica    New Prescriptions New Prescriptions   OXYCODONE HCL 10 MG TABS    Take 0.5-1 tablets (5-10 mg total) by mouth every 6 (six) hours as needed for severe pain.     Margarita Mail, PA-C 07/09/16 [place], MD 07/10/16 [phone]

## 2016-07-09 NOTE — ED Triage Notes (Signed)
Pt has hx of bone cancer. Pt c/o severe back pain, worsening over the past couple of days. Pt sts she has been helping lift her sick husband and it may have exacerbated the pain. Pt sts she needs pain control now. Pt used to go to pain clinic but sts she hasn't had any pain medication since mother's day simply because she didn't want any.  Pt has a pain clinic appointment Thursday but can't wait until then. Pain radiates down pt's left leg. Pt sts she still gets treatments for the cancer every 3 months but cannot recall what treatment it is.

## 2016-07-19 DIAGNOSIS — M25552 Pain in left hip: Secondary | ICD-10-CM | POA: Diagnosis not present

## 2016-07-19 DIAGNOSIS — M79605 Pain in left leg: Secondary | ICD-10-CM | POA: Diagnosis not present

## 2016-07-19 DIAGNOSIS — G894 Chronic pain syndrome: Secondary | ICD-10-CM | POA: Diagnosis not present

## 2016-07-19 DIAGNOSIS — Z79891 Long term (current) use of opiate analgesic: Secondary | ICD-10-CM | POA: Diagnosis not present

## 2016-07-19 DIAGNOSIS — M79662 Pain in left lower leg: Secondary | ICD-10-CM | POA: Diagnosis not present

## 2016-07-19 DIAGNOSIS — M545 Low back pain: Secondary | ICD-10-CM | POA: Diagnosis not present

## 2016-07-24 ENCOUNTER — Telehealth: Payer: Self-pay | Admitting: Family Medicine

## 2016-07-24 NOTE — Telephone Encounter (Signed)
Please inform her that she is long overdue for an appointment. Could you please schedule her a follow-up visit? Thanks.

## 2016-07-27 NOTE — Telephone Encounter (Signed)
Writer called patient per Dr. Jarold Song to schedule an appt that is overdue.  Patient's husband stated that he would have patient call back to schedule that appt.

## 2016-08-08 ENCOUNTER — Encounter: Payer: Self-pay | Admitting: Family Medicine

## 2016-08-08 ENCOUNTER — Ambulatory Visit: Payer: Medicare Other | Attending: Family Medicine | Admitting: Family Medicine

## 2016-08-08 ENCOUNTER — Ambulatory Visit: Payer: Medicare Other

## 2016-08-08 VITALS — BP 143/92 | HR 86 | Temp 98.8°F | Ht 61.0 in | Wt 159.4 lb

## 2016-08-08 DIAGNOSIS — G8929 Other chronic pain: Secondary | ICD-10-CM

## 2016-08-08 DIAGNOSIS — I10 Essential (primary) hypertension: Secondary | ICD-10-CM

## 2016-08-08 DIAGNOSIS — Z9851 Tubal ligation status: Secondary | ICD-10-CM | POA: Diagnosis not present

## 2016-08-08 DIAGNOSIS — Z9049 Acquired absence of other specified parts of digestive tract: Secondary | ICD-10-CM | POA: Diagnosis not present

## 2016-08-08 DIAGNOSIS — M5442 Lumbago with sciatica, left side: Secondary | ICD-10-CM | POA: Diagnosis not present

## 2016-08-08 DIAGNOSIS — C9001 Multiple myeloma in remission: Secondary | ICD-10-CM

## 2016-08-08 DIAGNOSIS — M1990 Unspecified osteoarthritis, unspecified site: Secondary | ICD-10-CM | POA: Insufficient documentation

## 2016-08-08 DIAGNOSIS — I480 Paroxysmal atrial fibrillation: Secondary | ICD-10-CM

## 2016-08-08 MED ORDER — METOPROLOL TARTRATE 25 MG PO TABS: 25.0000 mg | ORAL_TABLET | Freq: Two times a day (BID) | ORAL | 3 refills | Status: DC

## 2016-08-08 MED ORDER — AMLODIPINE BESYLATE 10 MG PO TABS: 10.0000 mg | ORAL_TABLET | Freq: Every day | ORAL | 5 refills | Status: DC

## 2016-08-08 MED ORDER — TRAMADOL HCL 50 MG PO TABS: 50.0000 mg | ORAL_TABLET | Freq: Two times a day (BID) | ORAL | 0 refills | Status: DC | PRN

## 2016-08-08 NOTE — Progress Notes (Signed)
Medication refills

## 2016-08-08 NOTE — Progress Notes (Signed)
Subjective:  Patient ID: Ruth Sanchez, female    DOB: 1946/10/20  Age: 70 y.o. MRN: [id number]  CC: Hypertension and Multiple Myeloma   HPI Ruth Sanchez  is 70 year old female with a history of Multiple Myeloma (managed by oncology and currently on Zometa infusions every 3 months), chronic low back pain, Hypertension here for a follow up visit. She had been referred to [facility] for management of chronic pain  however she stopped going to see them. She informs me she was unable to provide them with a notarized document and also due to the huge co-pays but her husband indicated that [facility] got upset when her pills were short. Of note I had given her oxycodone pills for a short while however she had returned with a significantly less than normal amount and reported that they had spilled on the floor.  She has been taking Tylenol for back pain and when she has flares she presents to the ED where she receives few pills of oxycodone. Her oncologist-Dr. Earlie Server has refused to write her pain medication she says and had asked her to request that of her PCP.     Past Medical History:  Diagnosis Date  . Arthritis   . Asthma   . Cancer ([place])   . Hypertension     Past Surgical History:  Procedure Laterality Date  . APPENDECTOMY    . CHOLECYSTECTOMY    . ESOPHAGOGASTRODUODENOSCOPY N/A 11/24/2015   Procedure: ESOPHAGOGASTRODUODENOSCOPY (EGD);  Surgeon: Clarene Essex, MD;  Location: [facility];  Service: Endoscopy;  Laterality: N/A;  . TUBAL LIGATION       Outpatient Medications Prior to Visit  Medication Sig Dispense Refill  . amLODipine (NORVASC) 10 MG tablet TAKE 1 TABLET BY MOUTH EVERY DAY (Patient taking differently: TAKE 1 TABLET BY MOUTH EVERY OTHER DAY) 30 tablet 0  . acetaminophen (TYLENOL) 500 MG tablet Take 1,000 mg by mouth every 6 (six) hours as needed for mild pain, moderate pain, fever or headache.     . Oxycodone HCl 10 MG TABS Take 0.5-1 tablets (5-10  mg total) by mouth every 6 (six) hours as needed for severe pain. (Patient not taking: Reported on 08/08/2016) 20 tablet 0   No facility-administered medications prior to visit.     ROS Review of Systems Constitutional: Negative for activity change, appetite change and fatigue.  HENT: Negative for congestion, sinus pressure and sore throat.   Eyes: Negative for visual disturbance.  Respiratory: Negative for cough, chest tightness, shortness of breath and wheezing.   Cardiovascular: Negative for chest pain and palpitations.  Gastrointestinal: Negative for abdominal pain, constipation and abdominal distention.  Endocrine: Negative for polydipsia.  Genitourinary: Negative for dysuria and frequency.  Musculoskeletal: Positive for back pain. Negative for arthralgias.  Skin: Negative for rash.  Neurological: Negative for tremors, light-headedness and numbness.  Hematological: Does not bruise/bleed easily.  Psychiatric/Behavioral: Negative for behavioral problems and agitation.   Objective:  BP (!) 143/92 (BP Location: Right Arm, Patient Position: Sitting, Cuff Size: Large)   Pulse 86   Temp 98.8 F (37.1 C) (Oral)   Ht '5\' 1"'$  (1.549 m)   Wt 159 lb 6.4 oz (72.3 kg)   SpO2 98%   BMI 30.12 kg/m   BP/Weight 08/08/2016 07/09/2016 9/62/8366  Systolic BP 294 765 465  Diastolic BP 92 88 87  Wt. (Lbs) 159.4 - 154.8  BMI 30.12 - 29.25      Physical Exam Constitutional: She is oriented to  person, place, and time. She appears well-developed and well-nourished.  Cardiovascular: Normal heart sounds and intact distal pulses.  .   No murmur heard. Pulmonary/Chest: Effort normal and breath sounds normal. She has no wheezes. She has no rales. She exhibits no tenderness.  Abdominal: Soft. Bowel sounds are normal. She exhibits no distension and no mass. There is no tenderness.  Musculoskeletal: She exhibits tenderness (tendernes to palpation of lumbar spine).  Neurological: She is alert and  oriented to person, place, and time.   CMP Latest Ref Rng & Units 05/09/2016 02/08/2016 11/30/2015  Glucose 70 - 140 mg/dl 78 92 98  BUN 7.0 - 26.0 mg/dL 11.7 8.7 11  Creatinine 0.6 - 1.1 mg/dL 0.8 0.8 0.93  Sodium 136 - 145 mEq/L 142 139 140  Potassium 3.5 - 5.1 mEq/L 3.3(L) 3.1(L) 3.9  Chloride 98 - 110 mmol/L - - 100  CO2 22 - 29 mEq/L _0 Calcium 8.4 - 10.4 mg/dL 9.3 9.3 8.7  Total Protein 6.4 - 8.3 g/dL 7.6 7.4 -  Total Bilirubin 0.20 - 1.20 mg/dL 0.66 1.07 -  Alkaline Phos 40 - 150 U/L 107 80 -  AST 5 - 34 U/L 20 39(H) -  ALT 0 - 55 U/L 15 23 -    Assessment & Plan:   1. Essential hypertensiSlightly elevated Hopefully addition of metoprolol help improve blood pressure AmlODipine (NORVASC) 10 MG tablet; Take 1 tablet (10 mg total) by mouth daily.  Dispense: 30 tablet; Refill: 5  2. Multiple myeloma in remission  Continue Zometa infusions Keep appointment with Dr Earlie Server  3. Chronic midline low back pain with left-sided sciatica Advised she will need to obtain pain medications from her Oncologist as pain is secondary to cancer and in the event he is unwilling she will have to follow up with [facility] whom I had referred her to.  4.Paroxysmal A. fib She has never been treated for this but is currently in sinus rhythm Will add metoprolol to her regimen.  Medications  . amLODipine (NORVASC) 10 MG tablet    Sig: Take 1 tablet (10 mg total) by mouth daily.    Dispense:  30 tablet    Refill:  5  . traMADol (ULTRAM) 50 MG tablet    Sig: Take 1 tablet (50 mg total) by mouth every 12 (twelve) hours as needed.    Dispense:  20 tablet    Refill:  0  . metoprolol tartrate (LOPRESSOR) 25 MG tablet    Sig: Take 1 tablet (25 mg total) by mouth 2 (two) times daily.    Dispense:  60 tablet    Refill:  3   advised that she will need to obtain pain medication from her oncologist as pain is secondary to cancer  Follow-up: Return in about 3 months (around 11/08/2016) for  follow up on Hypertension.   Arnoldo Morale MD

## 2016-08-31 ENCOUNTER — Emergency Department (HOSPITAL_COMMUNITY)
Admission: EM | Admit: 2016-08-31 | Discharge: 2016-09-01 | Disposition: A | Discharge status: Home / Self Care | Payer: Medicare Other | Attending: Emergency Medicine | Admitting: Emergency Medicine

## 2016-08-31 ENCOUNTER — Emergency Department (HOSPITAL_COMMUNITY): Payer: Medicare Other

## 2016-08-31 ENCOUNTER — Encounter (HOSPITAL_COMMUNITY): Payer: Self-pay | Admitting: Emergency Medicine

## 2016-08-31 DIAGNOSIS — E876 Hypokalemia: Secondary | ICD-10-CM | POA: Diagnosis not present

## 2016-08-31 DIAGNOSIS — Z85830 Personal history of malignant neoplasm of bone: Secondary | ICD-10-CM | POA: Diagnosis not present

## 2016-08-31 DIAGNOSIS — I10 Essential (primary) hypertension: Secondary | ICD-10-CM | POA: Diagnosis not present

## 2016-08-31 DIAGNOSIS — R079 Chest pain, unspecified: Secondary | ICD-10-CM

## 2016-08-31 DIAGNOSIS — J45909 Unspecified asthma, uncomplicated: Secondary | ICD-10-CM | POA: Diagnosis not present

## 2016-08-31 DIAGNOSIS — Z79899 Other long term (current) drug therapy: Secondary | ICD-10-CM | POA: Insufficient documentation

## 2016-08-31 DIAGNOSIS — C9000 Multiple myeloma not having achieved remission: Secondary | ICD-10-CM | POA: Diagnosis not present

## 2016-08-31 DIAGNOSIS — R0789 Other chest pain: Secondary | ICD-10-CM | POA: Diagnosis not present

## 2016-08-31 LAB — BASIC METABOLIC PANEL
Anion gap: 8 (ref 5–15)
BUN: 15 mg/dL (ref 6–20)
CALCIUM: 9.5 mg/dL (ref 8.9–10.3)
CHLORIDE: 110 mmol/L (ref 101–111)
CO2: 26 mmol/L (ref 22–32)
CREATININE: 0.79 mg/dL (ref 0.44–1.00)
Glucose, Bld: 109 mg/dL — ABNORMAL HIGH (ref 65–99)
Potassium: 2.7 mmol/L — CL (ref 3.5–5.1)
SODIUM: 144 mmol/L (ref 135–145)

## 2016-08-31 LAB — I-STAT TROPONIN, ED
TROPONIN I, POC: 0.01 ng/mL (ref 0.00–0.08)
TROPONIN I, POC: 0 ng/mL (ref 0.00–0.08)

## 2016-08-31 LAB — D-DIMER, QUANTITATIVE (NOT AT ARMC): D DIMER QUANT: 0.86 ug{FEU}/mL — AB (ref 0.00–0.50)

## 2016-08-31 LAB — CBC
HCT: 39.4 % (ref 36.0–46.0)
Hemoglobin: 13.5 g/dL (ref 12.0–15.0)
MCH: 30.2 pg (ref 26.0–34.0)
MCHC: 34.3 g/dL (ref 30.0–36.0)
MCV: 88.1 fL (ref 78.0–100.0)
PLATELETS: 164 10*3/uL (ref 150–400)
RBC: 4.47 MIL/uL (ref 3.87–5.11)
RDW: 13.6 % (ref 11.5–15.5)
WBC: 10.5 10*3/uL (ref 4.0–10.5)

## 2016-08-31 MED ORDER — HYDROCODONE-ACETAMINOPHEN 5-325 MG PO TABS
1.0000 | ORAL_TABLET | ORAL | Status: AC
  Administered 2016-08-31: 1 via ORAL
  Filled 2016-08-31: qty 1

## 2016-08-31 MED ORDER — MORPHINE SULFATE (PF) 4 MG/ML IV SOLN
4.0000 mg | Freq: Once | INTRAVENOUS | Status: AC
Start: 2016-08-31 — End: 2016-08-31
  Administered 2016-08-31: 4 mg via INTRAVENOUS
  Filled 2016-08-31: qty 1

## 2016-08-31 MED ORDER — MORPHINE SULFATE (PF) 4 MG/ML IV SOLN
4.0000 mg | Freq: Once | INTRAVENOUS | Status: DC
  Filled 2016-08-31: qty 1

## 2016-08-31 MED ORDER — POTASSIUM CHLORIDE ER 10 MEQ PO TBCR: 10.0000 meq | EXTENDED_RELEASE_TABLET | Freq: Every day | ORAL | 0 refills | Status: DC

## 2016-08-31 MED ORDER — POTASSIUM CHLORIDE 2 MEQ/ML IV SOLN
30.0000 meq | Freq: Once | INTRAVENOUS | Status: AC
  Administered 2016-08-31: 30 meq via INTRAVENOUS
  Filled 2016-08-31: qty 15

## 2016-08-31 MED ORDER — HYDROCODONE-ACETAMINOPHEN 5-325 MG PO TABS: 1.0000 | ORAL_TABLET | ORAL | 0 refills | Status: DC | PRN

## 2016-08-31 MED ORDER — IOPAMIDOL (ISOVUE-370) INJECTION 76%
100.0000 mL | Freq: Once | INTRAVENOUS | Status: AC | PRN
  Administered 2016-08-31: 100 mL via INTRAVENOUS

## 2016-08-31 MED ORDER — POTASSIUM CHLORIDE CRYS ER 20 MEQ PO TBCR
40.0000 meq | EXTENDED_RELEASE_TABLET | Freq: Once | ORAL | Status: AC
  Administered 2016-08-31: 40 meq via ORAL
  Filled 2016-08-31: qty 2

## 2016-08-31 NOTE — ED Provider Notes (Signed)
[facility] DEPT Provider Note CSN: [id number] Arrival date & time: 08/31/16  1816     History   Chief Complaint Chief Complaint  Patient presents with  . Chest Pain  . Back Pain    HPI Ruth Sanchez is a 70 y.o. female.  HPI Pt has history of bone cancer.  Sunday she started having pain in her chest.  It resolved the next day.  For the last several days she has been having the pain again her chest.  It is on the left side of her chest and goes to both sides.   She denies any fever or cough.  She has been sneezing.  No swelling in her legs.   No shortness of breath.  She has been told in the past her pain could be from her bone cancer.  She only takes tylenol for the pain.  She does have chronic back pain that she says is related to the cancer.  No high chol.  No dm.  No tobacco use.  She does have HTN.  No history of CAD or PE.  Past Medical History:  Diagnosis Date  . Arthritis   . Asthma   . Cancer ([place])   . Hypertension     Patient Active Problem List   Diagnosis Date Noted  . Urinary incontinence 08/03/2015  . Essential hypertension 05/13/2015  . Chronic back pain 05/13/2015  . Acute bronchitis 02/23/2013  . Dyspnea 02/23/2013  . Paroxysmal atrial fibrillation ([place]) 02/23/2013  . Arthritis 02/23/2013  . Goiter with tracheal compression and mild stridor 02/23/2013  . Asthma, chronic 02/23/2013  . Hypokalemia 02/23/2013  . Multiple myeloma in remission ([place]) 11/18/2011    Past Surgical History:  Procedure Laterality Date  . APPENDECTOMY    . CHOLECYSTECTOMY    . ESOPHAGOGASTRODUODENOSCOPY N/A 11/24/2015   Procedure: ESOPHAGOGASTRODUODENOSCOPY (EGD);  Surgeon: Clarene Essex, MD;  Location: [facility];  Service: Endoscopy;  Laterality: N/A;  . TUBAL LIGATION      OB History    No data available       Home Medications    Prior to Admission medications   Medication Sig Start Date End Date Taking? Authorizing Provider  acetaminophen (TYLENOL) 500 MG  tablet Take 1,000 mg by mouth every 6 (six) hours as needed for mild pain, moderate pain, fever or headache.    Yes Historical Provider, MD  amLODipine (NORVASC) 10 MG tablet Take 1 tablet (10 mg total) by mouth daily. 08/08/16  Yes Arnoldo Morale, MD  metoprolol tartrate (LOPRESSOR) 25 MG tablet Take 1 tablet (25 mg total) by mouth 2 (two) times daily. 08/08/16  Yes Arnoldo Morale, MD  HYDROcodone-acetaminophen (NORCO/VICODIN) 5-325 MG tablet Take 1-2 tablets by mouth every 4 (four) hours as needed. 08/31/16   Dorie Rank, MD  Oxycodone HCl 10 MG TABS Take 0.5-1 tablets (5-10 mg total) by mouth every 6 (six) hours as needed for severe pain. Patient not taking: Reported on 08/08/2016 07/09/16   Margarita Mail, PA-C  potassium chloride (K-DUR) 10 MEQ tablet Take 1 tablet (10 mEq total) by mouth daily. 08/31/16   Dorie Rank, MD  traMADol (ULTRAM) 50 MG tablet Take 1 tablet (50 mg total) by mouth every 12 (twelve) hours as needed. 08/08/16   Arnoldo Morale, MD    Family History Family History  Problem Relation Age of Onset  . Family history unknown: Yes    Social History Social History  Substance Use Topics  . Smoking status: Never Smoker  . Smokeless tobacco:  Never Used  . Alcohol use No     Allergies   Gabapentin and Aspirin   Review of Systems Review of Systems  All other systems reviewed and are negative.    Physical Exam Updated Vital Signs BP (!) 166/103   Pulse 95   Resp 20   SpO2 96%   Physical Exam  Constitutional: She appears well-developed and well-nourished. No distress.  HENT:  Head: Normocephalic and atraumatic.  Right Ear: External ear normal.  Left Ear: External ear normal.  Eyes: Conjunctivae are normal. Right eye exhibits no discharge. Left eye exhibits no discharge. No scleral icterus.  Neck: Neck supple. No tracheal deviation present.  Cardiovascular: Normal rate, regular rhythm and intact distal pulses.   Pulmonary/Chest: Effort normal and breath sounds  normal. No stridor. No respiratory distress. She has no wheezes. She has no rales.  Abdominal: Soft. Bowel sounds are normal. She exhibits no distension. There is no tenderness. There is no rebound and no guarding.  Musculoskeletal: She exhibits no edema or tenderness.  Neurological: She is alert. She has normal strength. No cranial nerve deficit (no facial droop, extraocular movements intact, no slurred speech) or sensory deficit. She exhibits normal muscle tone. She displays no seizure activity. Coordination normal.  Skin: Skin is warm and dry. No rash noted.  Psychiatric: She has a normal mood and affect.  Nursing note and vitals reviewed.    ED Treatments / Results  Labs (all labs ordered are listed, but only abnormal results are displayed) Labs Reviewed  BASIC METABOLIC PANEL - Abnormal; Notable for the following:       Result Value   Potassium 2.7 (*)    Glucose, Bld 109 (*)    All other components within normal limits  D-DIMER, QUANTITATIVE (NOT AT [facility]) - Abnormal; Notable for the following:    D-Dimer, Quant 0.86 (*)    All other components within normal limits  CBC  I-STAT TROPOININ, ED  I-STAT TROPOININ, ED    EKG  EKG Interpretation  Date/Time:  Friday August 31 2016 18:26:53 EST Ventricular Rate:  93 PR Interval:    QRS Duration: 85 QT Interval:  355 QTC Calculation: 442 R Axis:   52 Text Interpretation:  Atrial fibrillation Borderline T abnormalities, diffuse leads No significant change since last tracing Confirmed by Rhonda Linan  MD-J, Imaan Padgett ([id number]) on 08/31/2016 7:23:38 PM       Radiology Dg Chest 2 View  Result Date: 08/31/2016 CLINICAL DATA:  Intermittent chest pain over the last 6 days. EXAM: CHEST  2 VIEW COMPARISON:  07/09/2016 and multiple previous FINDINGS: Chronic cardiomegaly. Aortic atherosclerosis. Vascular prominence of the right superior mediastinum. Chronic linear scarring at the lung bases. No evidence of edema, infiltrate, collapse or effusion.  IMPRESSION: No acute finding. Chronic cardiomegaly. Aortic atherosclerosis. Basilar pulmonary scarring. Vascular superior mediastinal prominence. Electronically Signed   By: Nelson Chimes M.D.   On: 08/31/2016 19:44   Ct Angio Chest Pe W And/or Wo Contrast  Result Date: 08/31/2016 CLINICAL DATA:  Multiple myeloma.  Back pain. EXAM: CT ANGIOGRAPHY CHEST WITH CONTRAST TECHNIQUE: Multidetector CT imaging of the chest was performed using the standard protocol during bolus administration of intravenous contrast. Multiplanar CT image reconstructions and MIPs were obtained to evaluate the vascular anatomy. CONTRAST:  100 mL Isovue 370 COMPARISON:  CT 11/24/2015 FINDINGS: Cardiovascular: No filling defects within the pulmonary suggest acute pulmonary embolism. No acute findings of the aorta great vessels. Mediastinum/Nodes: Large goiter extending from the LEFT lobe of thyroid gland  measures 4.7 cm and deflects trachea. No change from prior. No Mediastinal lymphadenopathy. No Pericardial fluid. Small hiatal hernia. Lungs/Pleura: There is mild interseptal thickening in the lung bases. No pleural fluid. No pneumonia or pneumothorax. Upper Abdomen: Limited view of the liver, kidneys, pancreas are unremarkable. Normal adrenal glands. Musculoskeletal: There is cyst permeative pattern throughout the spine consistent with myeloma. This continuous osteophytosis anteriorly in the spine. No acute fracture of the spine. Review of the MIP images confirms the above findings. IMPRESSION: 1. No evidence acute pulmonary embolism. 2. Mild interstitial edema.  No infiltrate or pneumothorax. 3. Multiple myeloma pattern within the spine without evidence acute fracture. Electronically Signed   By: Suzy Bouchard M.D.   On: 08/31/2016 21:38    Procedures Procedures (including critical care time)  Medications Ordered in ED Medications  morphine 4 MG/ML injection 4 mg (4 mg Intravenous Not Given 08/31/16 2031)  potassium chloride 30  mEq in sodium chloride 0.9 % 265 mL (KCL MULTIRUN) IVPB (30 mEq Intravenous Given 08/31/16 2101)  HYDROcodone-acetaminophen (NORCO/VICODIN) 5-325 MG per tablet 1 tablet (not administered)  potassium chloride SA (K-DUR,KLOR-CON) CR tablet 40 mEq (40 mEq Oral Given 08/31/16 2032)  iopamidol (ISOVUE-370) 76 % injection 100 mL (100 mLs Intravenous Contrast Given 08/31/16 2116)  morphine 4 MG/ML injection 4 mg (4 mg Intravenous Given 08/31/16 2053)     Initial Impression / Assessment and Plan / ED Course  I have reviewed the triage vital signs and the nursing notes.  Pertinent labs & imaging results that were available during my care of the patient were reviewed by me and considered in my medical decision making (see chart for details).  Clinical Course as of Aug 31 2328  Fri Aug 31, 2016  1928 Low  Potassium: (!!) 2.7 [JK]  1928 Potassium ordered  [JK]  2032 CXR negative.  D Dimer elevated.  Will CT to rule out PE  [JK]    Clinical Course User Index [JK] Dorie Rank, MD   Doubt sx are related to ACS or PE.  Negative ct scan.  Cardiac enzymes are negative despite days of sx.  Pt does have multiple myeloma.  This could be contributing to her pain.  Potassium replaced in the ED.  Will dc home withi pain meds and potassium.  Follow up with PCP  Final Clinical Impressions(s) / ED Diagnoses   Final diagnoses:  Chest pain, unspecified type  Hypokalemia    New Prescriptions New Prescriptions   HYDROCODONE-ACETAMINOPHEN (NORCO/VICODIN) 5-325 MG TABLET    Take 1-2 tablets by mouth every 4 (four) hours as needed.   POTASSIUM CHLORIDE (K-DUR) 10 MEQ TABLET    Take 1 tablet (10 mEq total) by mouth daily.     Dorie Rank, MD 08/31/16 [phone]

## 2016-08-31 NOTE — Discharge Instructions (Signed)
Take the medications as prescribed, follow up with your primary care doctor next week to check on your chest pain and the potassium level

## 2016-08-31 NOTE — ED Triage Notes (Signed)
Pt with hx bone cancer c/o ongoing back and constant chest pain onset Sunday night worse with inspiration.

## 2016-09-23 ENCOUNTER — Encounter (HOSPITAL_COMMUNITY): Payer: Self-pay | Admitting: Emergency Medicine

## 2016-09-23 ENCOUNTER — Emergency Department (HOSPITAL_COMMUNITY): Payer: Medicare Other

## 2016-09-23 ENCOUNTER — Emergency Department (HOSPITAL_COMMUNITY)
Admission: EM | Admit: 2016-09-23 | Discharge: 2016-09-23 | Disposition: A | Discharge status: Home / Self Care | Payer: Medicare Other | Attending: Emergency Medicine | Admitting: Emergency Medicine

## 2016-09-23 DIAGNOSIS — Z85830 Personal history of malignant neoplasm of bone: Secondary | ICD-10-CM | POA: Insufficient documentation

## 2016-09-23 DIAGNOSIS — C9002 Multiple myeloma in relapse: Secondary | ICD-10-CM | POA: Diagnosis not present

## 2016-09-23 DIAGNOSIS — J45909 Unspecified asthma, uncomplicated: Secondary | ICD-10-CM | POA: Diagnosis not present

## 2016-09-23 DIAGNOSIS — Z79899 Other long term (current) drug therapy: Secondary | ICD-10-CM | POA: Insufficient documentation

## 2016-09-23 DIAGNOSIS — I10 Essential (primary) hypertension: Secondary | ICD-10-CM | POA: Diagnosis not present

## 2016-09-23 DIAGNOSIS — M545 Low back pain: Secondary | ICD-10-CM | POA: Diagnosis not present

## 2016-09-23 DIAGNOSIS — R0789 Other chest pain: Secondary | ICD-10-CM | POA: Diagnosis not present

## 2016-09-23 LAB — ACETAMINOPHEN LEVEL: Acetaminophen (Tylenol), Serum: 16 ug/mL (ref 10–30)

## 2016-09-23 LAB — HEPATIC FUNCTION PANEL
ALK PHOS: 107 U/L (ref 38–126)
ALT: 12 U/L — AB (ref 14–54)
AST: 24 U/L (ref 15–41)
Albumin: 4.2 g/dL (ref 3.5–5.0)
BILIRUBIN DIRECT: 0.2 mg/dL (ref 0.1–0.5)
BILIRUBIN INDIRECT: 0.9 mg/dL (ref 0.3–0.9)
BILIRUBIN TOTAL: 1.1 mg/dL (ref 0.3–1.2)
Total Protein: 7.9 g/dL (ref 6.5–8.1)

## 2016-09-23 MED ORDER — OXYCODONE-ACETAMINOPHEN 5-325 MG PO TABS
1.0000 | ORAL_TABLET | Freq: Once | ORAL | Status: AC
  Administered 2016-09-23: 1 via ORAL
  Filled 2016-09-23: qty 1

## 2016-09-23 MED ORDER — OXYCODONE HCL 10 MG PO TABS: 5.0000 mg | ORAL_TABLET | Freq: Two times a day (BID) | ORAL | 0 refills | Status: DC | PRN

## 2016-09-23 MED ORDER — TRAMADOL HCL 50 MG PO TABS
50.0000 mg | ORAL_TABLET | Freq: Once | ORAL | Status: AC
  Administered 2016-09-23: 50 mg via ORAL
  Filled 2016-09-23: qty 1

## 2016-09-23 NOTE — ED Notes (Signed)
Per x ray. Pt refused x ray until pain medication ordered.

## 2016-09-23 NOTE — ED Notes (Signed)
Radiology notified that pt has been medicated for pain and is ready for imaging.

## 2016-09-23 NOTE — ED Notes (Signed)
Pt. Stated that she will take her blood pressure meds once she gets home.

## 2016-09-23 NOTE — ED Notes (Signed)
MD at bedside. 

## 2016-09-23 NOTE — ED Provider Notes (Signed)
[facility] DEPT Provider Note   CSN: [id number] Arrival date & time: 09/23/16  1806     History   Chief Complaint Chief Complaint  Patient presents with  . Back Pain    HPI Ruth Sanchez is a 70 y.o. female.  HPI   70 year old female with history of multiple myeloma in remission, arthritis, chronic back pain presenting with complaint of back pain. Patient report for the past 10 days she has had persistent pain to her left lower back. She described pain as "is just hurts" as well as pain to her left ribs. Pain is constant, worsened with movement. Pain occasionally radiates to her left thigh. Patient felt that this is "bone pain from bone cancer" but she also states she is in remission. She has tried taking Tylenol at least 2 pills every 4 hours but her pain is not well controlled. She denies having fever, abdominal pain, dysuria, bowel bladder incontinence or saddle anesthesia.  Past Medical History:  Diagnosis Date  . Arthritis   . Asthma   . Cancer ([place])   . Hypertension     Patient Active Problem List   Diagnosis Date Noted  . Urinary incontinence 08/03/2015  . Essential hypertension 05/13/2015  . Chronic back pain 05/13/2015  . Acute bronchitis 02/23/2013  . Dyspnea 02/23/2013  . Paroxysmal atrial fibrillation ([place]) 02/23/2013  . Arthritis 02/23/2013  . Goiter with tracheal compression and mild stridor 02/23/2013  . Asthma, chronic 02/23/2013  . Hypokalemia 02/23/2013  . Multiple myeloma in remission ([place]) 11/18/2011    Past Surgical History:  Procedure Laterality Date  . APPENDECTOMY    . CHOLECYSTECTOMY    . ESOPHAGOGASTRODUODENOSCOPY N/A 11/24/2015   Procedure: ESOPHAGOGASTRODUODENOSCOPY (EGD);  Surgeon: Clarene Essex, MD;  Location: [facility];  Service: Endoscopy;  Laterality: N/A;  . TUBAL LIGATION      OB History    No data available       Home Medications    Prior to Admission medications   Medication Sig Start Date End Date Taking?  Authorizing Provider  acetaminophen (TYLENOL) 500 MG tablet Take 1,000 mg by mouth every 6 (six) hours as needed for mild pain, moderate pain, fever or headache.     Historical Provider, MD  amLODipine (NORVASC) 10 MG tablet Take 1 tablet (10 mg total) by mouth daily. 08/08/16   Arnoldo Morale, MD  HYDROcodone-acetaminophen (NORCO/VICODIN) 5-325 MG tablet Take 1-2 tablets by mouth every 4 (four) hours as needed. 08/31/16   Dorie Rank, MD  metoprolol tartrate (LOPRESSOR) 25 MG tablet Take 1 tablet (25 mg total) by mouth 2 (two) times daily. 08/08/16   Arnoldo Morale, MD  Oxycodone HCl 10 MG TABS Take 0.5-1 tablets (5-10 mg total) by mouth every 6 (six) hours as needed for severe pain. Patient not taking: Reported on 08/08/2016 07/09/16   Margarita Mail, PA-C  potassium chloride (K-DUR) 10 MEQ tablet Take 1 tablet (10 mEq total) by mouth daily. 08/31/16   Dorie Rank, MD  traMADol (ULTRAM) 50 MG tablet Take 1 tablet (50 mg total) by mouth every 12 (twelve) hours as needed. 08/08/16   Arnoldo Morale, MD    Family History Family History  Problem Relation Age of Onset  . Family history unknown: Yes    Social History Social History  Substance Use Topics  . Smoking status: Never Smoker  . Smokeless tobacco: Never Used  . Alcohol use No     Allergies   Gabapentin and Aspirin   Review of Systems Review of  Systems  All other systems reviewed and are negative.    Physical Exam Updated Vital Signs BP 139/94 (BP Location: Left Arm)   Pulse 95   Resp 19   SpO2 99%   Physical Exam  Constitutional: She appears well-developed and well-nourished. No distress.  HENT:  Head: Atraumatic.  Eyes: Conjunctivae are normal.  Neck: Neck supple.  Cardiovascular: Normal rate, regular rhythm and intact distal pulses.   Pulmonary/Chest: Effort normal and breath sounds normal. She exhibits tenderness (Tenderness to left lower anterior ribs on palpation without crepitus or emphysema.).  Abdominal: Soft.  There is no tenderness.  Musculoskeletal: She exhibits tenderness (Tenderness to lumbar and left paralumbar spinal muscle on palpation without crepitus or step-off.).  Neurological: She is alert. She displays normal reflexes.  Skin: No rash noted.  Psychiatric: She has a normal mood and affect.  Nursing note and vitals reviewed.    ED Treatments / Results  Labs (all labs ordered are listed, but only abnormal results are displayed) Labs Reviewed - No data to display  EKG  EKG Interpretation None       Radiology Dg Ribs Unilateral W/chest Left  Result Date: 09/23/2016 CLINICAL DATA:  Nontraumatic left chest wall pain. History of multiple myeloma. EXAM: LEFT RIBS AND CHEST - 3+ VIEW COMPARISON:  08/31/2016 FINDINGS: Numerous tiny lytic defects air scattered throughout the visible osseous structures, consistent with the described history of multiple myeloma. No evidence of pathologic fracture or other acute bone abnormality. Mild linear scarring is present in the bases, unchanged. No airspace consolidation. Unchanged mild cardiomegaly. Hilar and mediastinal contours are unremarkable and unchanged. Pulmonary vasculature is normal. IMPRESSION: 1. Numerous lytic defects throughout the visible osseous structures, consistent with multiple myeloma. No pathologic fracture is evident. 2. Stable cardiomegaly.  No acute cardiopulmonary findings. Electronically Signed   By: Andreas Newport M.D.   On: 09/23/2016 21:18   Dg Lumbar Spine Complete  Result Date: 09/23/2016 CLINICAL DATA:  Nontraumatic left-sided back pain. History of multiple myeloma. EXAM: LUMBAR SPINE - COMPLETE 4+ VIEW COMPARISON:  07/09/2016 FINDINGS: Numerous tiny lytic defects are present throughout the visible bones, typical of multiple myeloma. No pathologic fracture is evident. The lumbar vertebrae are normal in height. Minimal degenerative appearing retrolisthesis at L2-3, unchanged. Mild sclerotic facet arthropathy from L3  through the sacrum. The calcified right ovarian dermoid is incidentally noted. IMPRESSION: 1. Numerous tiny lytic defects throughout the bones, typical of multiple myeloma. No pathologic fracture or other acute bony abnormality is evident. 2. Lower lumbar facet arthritis. 3. Incidental calcified dermoid of the right ovary. Electronically Signed   By: Andreas Newport M.D.   On: 09/23/2016 21:22    Procedures Procedures (including critical care time)  Medications Ordered in ED Medications - No data to display   Initial Impression / Assessment and Plan / ED Course  I have reviewed the triage vital signs and the nursing notes.  Pertinent labs & imaging results that were available during my care of the patient were reviewed by me and considered in my medical decision making (see chart for details).  Clinical Course     BP 151/99 (BP Location: Left Arm)   Pulse 93   Resp 17   SpO2 98%    Final Clinical Impressions(s) / ED Diagnoses   Final diagnoses:  Multiple myeloma in relapse [facility])    New Prescriptions Current Discharge Medication List     7:04 PM Pt with pain to L lower anterior ribs and L lower back,hx of  MM. Pt was concern of "bone pain". Therefore, will obtain xray to r/o malignancy. She also report increase consumption of tylenol for pain. No abd pain, n/v.  Will obtain tylenol level and LFT to r/o tylenol toxicity. Care discussed. With DR. Lockwood.  9:52 PM Left ribs x-ray and lower spine x-rays shows multiple lytic lesion consistence with multiple myeloma. This is likely the cause of patient's pain. She will need to follow-up closely with her oncologist for the management. Plan to provide pain medication for symptomatic relief. Her liver function and her Tylenol level today has been unremarkable. She is able to emanate in stable for discharge. Return precaution discussed.   Domenic Moras, PA-C 09/23/16 2154    Carmin Muskrat, MD 09/23/16 [phone]

## 2016-09-23 NOTE — ED Notes (Addendum)
Provider at bedside speaking with patient

## 2016-09-23 NOTE — Discharge Instructions (Signed)
Your bone pain is likely due to multiple myeloma.  Please call and follow up closely with your oncologist for further management. Use number in the paper to find a primary care provider or try to establish care through [facility] for further management of your health.  Return to the ER if you have any concerns.

## 2016-09-23 NOTE — ED Triage Notes (Signed)
Patient states that she has bone cancer and having back pain that has ben going on for several days/weeks.  Patient tried to make an appt with PCP but unable to get an appt until Jan 2018.  Patient states that she takes bottles of tylenol and not easing the pain so she came to get relief.

## 2016-09-26 ENCOUNTER — Emergency Department (HOSPITAL_COMMUNITY): Payer: Medicare Other

## 2016-09-26 ENCOUNTER — Emergency Department (HOSPITAL_COMMUNITY)
Admission: EM | Admit: 2016-09-26 | Discharge: 2016-09-26 | Disposition: A | Discharge status: Home / Self Care | Payer: Medicare Other | Attending: Emergency Medicine | Admitting: Emergency Medicine

## 2016-09-26 DIAGNOSIS — J040 Acute laryngitis: Secondary | ICD-10-CM | POA: Diagnosis not present

## 2016-09-26 DIAGNOSIS — Z859 Personal history of malignant neoplasm, unspecified: Secondary | ICD-10-CM | POA: Insufficient documentation

## 2016-09-26 DIAGNOSIS — I10 Essential (primary) hypertension: Secondary | ICD-10-CM | POA: Insufficient documentation

## 2016-09-26 DIAGNOSIS — R0602 Shortness of breath: Secondary | ICD-10-CM | POA: Diagnosis not present

## 2016-09-26 DIAGNOSIS — R062 Wheezing: Secondary | ICD-10-CM | POA: Insufficient documentation

## 2016-09-26 DIAGNOSIS — Z79899 Other long term (current) drug therapy: Secondary | ICD-10-CM | POA: Insufficient documentation

## 2016-09-26 DIAGNOSIS — J988 Other specified respiratory disorders: Secondary | ICD-10-CM

## 2016-09-26 DIAGNOSIS — J989 Respiratory disorder, unspecified: Secondary | ICD-10-CM | POA: Diagnosis not present

## 2016-09-26 LAB — BASIC METABOLIC PANEL
Anion gap: 9 (ref 5–15)
BUN: 19 mg/dL (ref 6–20)
CALCIUM: 9.2 mg/dL (ref 8.9–10.3)
CHLORIDE: 105 mmol/L (ref 101–111)
CO2: 25 mmol/L (ref 22–32)
CREATININE: 0.64 mg/dL (ref 0.44–1.00)
GFR calc Af Amer: >60 mL/min (ref >60)
GFR calc non Af Amer: >60 mL/min (ref >60)
Glucose, Bld: 96 mg/dL (ref 65–99)
Potassium: 3.3 mmol/L — ABNORMAL LOW (ref 3.5–5.1)
SODIUM: 139 mmol/L (ref 135–145)

## 2016-09-26 LAB — CBC
HCT: 34.7 % — ABNORMAL LOW (ref 36.0–46.0)
HEMOGLOBIN: 11.6 g/dL — AB (ref 12.0–15.0)
MCH: 30.2 pg (ref 26.0–34.0)
MCHC: 33.4 g/dL (ref 30.0–36.0)
MCV: 90.4 fL (ref 78.0–100.0)
Platelets: 151 10*3/uL (ref 150–400)
RBC: 3.84 MIL/uL — ABNORMAL LOW (ref 3.87–5.11)
RDW: 13.8 % (ref 11.5–15.5)
WBC: 9 10*3/uL (ref 4.0–10.5)

## 2016-09-26 MED ORDER — PREDNISONE 20 MG PO TABS
60.0000 mg | ORAL_TABLET | Freq: Once | ORAL | Status: AC
  Administered 2016-09-26: 60 mg via ORAL
  Filled 2016-09-26: qty 3

## 2016-09-26 MED ORDER — LEVOFLOXACIN 750 MG PO TABS: 750.0000 mg | ORAL_TABLET | Freq: Every day | ORAL | 0 refills | Status: AC

## 2016-09-26 MED ORDER — OXYCODONE-ACETAMINOPHEN 5-325 MG PO TABS: 1.0000 | ORAL_TABLET | ORAL | 0 refills | Status: DC | PRN

## 2016-09-26 MED ORDER — IPRATROPIUM-ALBUTEROL 0.5-2.5 (3) MG/3ML IN SOLN
3.0000 mL | Freq: Once | RESPIRATORY_TRACT | Status: AC
  Administered 2016-09-26: 3 mL via RESPIRATORY_TRACT
  Filled 2016-09-26: qty 3

## 2016-09-26 MED ORDER — HYDROCODONE-ACETAMINOPHEN 5-325 MG PO TABS
1.0000 | ORAL_TABLET | Freq: Once | ORAL | Status: AC
  Administered 2016-09-26: 1 via ORAL
  Filled 2016-09-26: qty 1

## 2016-09-26 MED ORDER — ALBUTEROL SULFATE HFA 108 (90 BASE) MCG/ACT IN AERS: 2.0000 | INHALATION_SPRAY | RESPIRATORY_TRACT | 0 refills | Status: DC | PRN

## 2016-09-26 MED ORDER — PREDNISONE 20 MG PO TABS: 60.0000 mg | ORAL_TABLET | Freq: Every day | ORAL | 0 refills | Status: AC

## 2016-09-26 NOTE — ED Notes (Signed)
Patient denies pain and is resting comfortably.  

## 2016-09-26 NOTE — ED Notes (Signed)
Patient states earlier she was short of breath. States she has bone cancer and this is causing her lower back pain. States she has diagnosed asthma and bronchitis. States she started becoming short of breath yesterday afternoon. She has an inhaler which she has been using and used on the way here, however it is expired because she hasn't been able to get a new one. She feels some relief when she uses. Voice is raspy.

## 2016-09-26 NOTE — ED Provider Notes (Signed)
[facility] DEPT Provider Note   CSN: [id number] Arrival date & time: 09/26/16  1930     History   Chief Complaint Chief Complaint  Patient presents with  . Shortness of Breath    HPI Ruth Sanchez is a 70 y.o. female.  The history is provided by the patient.  URI   This is a new problem. The current episode started more than 2 days ago. The problem has been gradually worsening. There has been no fever. Associated symptoms include congestion, plugged ear sensation, rhinorrhea, sinus pain, cough and wheezing. Pertinent negatives include no chest pain, no abdominal pain, no diarrhea, no nausea, no vomiting, no dysuria, no headaches, no sore throat, no neck pain and no rash. She has tried an inhaler for the symptoms. The treatment provided significant relief.    Past Medical History:  Diagnosis Date  . Arthritis   . Asthma   . Cancer ([place])   . Hypertension     Patient Active Problem List   Diagnosis Date Noted  . Urinary incontinence 08/03/2015  . Essential hypertension 05/13/2015  . Chronic back pain 05/13/2015  . Acute bronchitis 02/23/2013  . Dyspnea 02/23/2013  . Paroxysmal atrial fibrillation ([place]) 02/23/2013  . Arthritis 02/23/2013  . Goiter with tracheal compression and mild stridor 02/23/2013  . Asthma, chronic 02/23/2013  . Hypokalemia 02/23/2013  . Multiple myeloma in remission ([place]) 11/18/2011    Past Surgical History:  Procedure Laterality Date  . APPENDECTOMY    . CHOLECYSTECTOMY    . ESOPHAGOGASTRODUODENOSCOPY N/A 11/24/2015   Procedure: ESOPHAGOGASTRODUODENOSCOPY (EGD);  Surgeon: Clarene Essex, MD;  Location: [facility];  Service: Endoscopy;  Laterality: N/A;  . TUBAL LIGATION      OB History    No data available       Home Medications    Prior to Admission medications   Medication Sig Start Date End Date Taking? Authorizing Provider  acetaminophen (TYLENOL) 500 MG tablet Take 1,000 mg by mouth every 6 (six) hours as needed for mild pain,  moderate pain, fever or headache.    Yes Historical Provider, MD  albuterol (PROVENTIL HFA;VENTOLIN HFA) 108 (90 Base) MCG/ACT inhaler Inhale 2 puffs into the lungs every 4 (four) hours as needed for wheezing or shortness of breath. 09/26/16   Duffy Bruce, MD  amLODipine (NORVASC) 10 MG tablet Take 1 tablet (10 mg total) by mouth daily. Patient not taking: Reported on 09/26/2016 08/08/16   Arnoldo Morale, MD  levofloxacin (LEVAQUIN) 750 MG tablet Take 1 tablet (750 mg total) by mouth daily. 09/26/16 10/01/16  Duffy Bruce, MD  metoprolol tartrate (LOPRESSOR) 25 MG tablet Take 1 tablet (25 mg total) by mouth 2 (two) times daily. Patient not taking: Reported on 09/23/2016 08/08/16   Arnoldo Morale, MD  Oxycodone HCl 10 MG TABS Take 0.5-1 tablets (5-10 mg total) by mouth every 12 (twelve) hours as needed. Patient not taking: Reported on 09/26/2016 09/23/16   Domenic Moras, PA-C  oxyCODONE-acetaminophen (PERCOCET/ROXICET) 5-325 MG tablet Take 1-2 tablets by mouth every 4 (four) hours as needed for severe pain. 09/26/16   Duffy Bruce, MD  potassium chloride (K-DUR) 10 MEQ tablet Take 1 tablet (10 mEq total) by mouth daily. Patient not taking: Reported on 09/23/2016 08/31/16   Dorie Rank, MD  predniSONE (DELTASONE) 20 MG tablet Take 3 tablets (60 mg total) by mouth daily. 09/26/16 10/01/16  Duffy Bruce, MD    Family History Family History  Problem Relation Age of Onset  . Family history unknown: Yes  Social History Social History  Substance Use Topics  . Smoking status: Never Smoker  . Smokeless tobacco: Never Used  . Alcohol use No     Allergies   Gabapentin and Aspirin   Review of Systems Review of Systems  Constitutional: Positive for fatigue. Negative for chills and fever.  HENT: Positive for congestion, rhinorrhea, sinus pain and voice change. Negative for sore throat.   Eyes: Negative for visual disturbance.  Respiratory: Positive for cough and wheezing. Negative for shortness of  breath.   Cardiovascular: Negative for chest pain and leg swelling.  Gastrointestinal: Negative for abdominal pain, diarrhea, nausea and vomiting.  Genitourinary: Negative for dysuria, flank pain, vaginal bleeding and vaginal discharge.  Musculoskeletal: Negative for neck pain.  Skin: Negative for rash.  Allergic/Immunologic: Negative for immunocompromised state.  Neurological: Negative for syncope and headaches.  Hematological: Does not bruise/bleed easily.  All other systems reviewed and are negative.    Physical Exam Updated Vital Signs BP 142/89 (BP Location: Right Arm)   Pulse 98   Temp 98.5 F (36.9 C) (Oral)   Resp 18   SpO2 98%   Physical Exam  Constitutional: She appears well-developed and well-nourished. No distress.  HENT:  Head: Normocephalic.  Mouth/Throat: No oropharyngeal exudate.  Mild posterior pharyngeal erythema. No tonsillar asymmetry or swelling. Nasal congestion and clear discharge. Hoarse, raspy voice.  Eyes: Conjunctivae are normal. Pupils are equal, round, and reactive to light.  Neck: Neck supple.  Cardiovascular: Normal rate, regular rhythm and normal heart sounds.  Exam reveals no friction rub.   No murmur heard. Pulmonary/Chest: Effort normal. No respiratory distress. She has wheezes (faint, end-expiratory only). She has no rales.  Abdominal: Soft. Bowel sounds are normal. She exhibits no distension. There is no tenderness.  Musculoskeletal: She exhibits no edema.  Neurological: She is alert. No sensory deficit.  Skin: Skin is warm.  Nursing note and vitals reviewed.    ED Treatments / Results  Labs (all labs ordered are listed, but only abnormal results are displayed) Labs Reviewed  CBC - Abnormal; Notable for the following:       Result Value   RBC 3.84 (*)    Hemoglobin 11.6 (*)    HCT 34.7 (*)    All other components within normal limits  BASIC METABOLIC PANEL - Abnormal; Notable for the following:    Potassium 3.3 (*)    All other  components within normal limits    EKG  EKG Interpretation  Date/Time:  Wednesday September 26 2016 21:31:49 EST Ventricular Rate:  87 PR Interval:    QRS Duration: 86 QT Interval:  370 QTC Calculation: 446 R Axis:   48 Text Interpretation:  Atrial fibrillation Borderline T abnormalities, diffuse leads Confirmed by Kenna Gilbert, KRISTEN [phone]) on 09/27/2016 9:15:14 AM       Radiology Dg Chest 2 View  Result Date: 09/26/2016 CLINICAL DATA:  Bronchitis. Shortness of breath. Bone cancer with diffuse pain. Multiple myeloma. EXAM: CHEST  2 VIEW COMPARISON:  Chest and rib radiographs 09/23/2016. CT of the chest 08/31/2016. FINDINGS: Heart size is exaggerated by low lung volumes. Areas of linear atelectasis or scarring are again noted at the lung bases. No significant airspace consolidation is present otherwise. There is no edema or effusion. Multiple lytic lesions are again noted throughout the axial skeleton, the distal clavicles, and humeri compatible with multiple myeloma. IMPRESSION: 1. Borderline cardiomegaly without failure. 2. Linear atelectasis or scarring at the lung bases bilaterally. 3. No acute cardiopulmonary disease. 4.  Multiple myeloma. Electronically Signed   By: San Morelle M.D.   On: 09/26/2016 21:40    Procedures Procedures (including critical care time)  Medications Ordered in ED Medications  predniSONE (DELTASONE) tablet 60 mg (60 mg Oral Given 09/26/16 2047)  ipratropium-albuterol (DUONEB) 0.5-2.5 (3) MG/3ML nebulizer solution 3 mL (3 mLs Nebulization Given 09/26/16 2036)  HYDROcodone-acetaminophen (NORCO/VICODIN) 5-325 MG per tablet 1 tablet (1 tablet Oral Given 09/26/16 2047)     Initial Impression / Assessment and Plan / ED Course  I have reviewed the triage vital signs and the nursing notes.  Pertinent labs & imaging results that were available during my care of the patient were reviewed by me and considered in my medical decision making (see chart for  details).  Clinical Course     70 yo F with PMHx as above here with cough, nasal congestion, rhinorrhea, and hoarseness. On arrival, VSS and WNL. Exam is as above. Primary suspicion is viral laryngitis with possible bronchitis, and pt has known sick contacts. She also likely has a component of RAD/asthma with sputum production. CXR is clear with no PNA. EKG non-ischemic. CBC, BMP unremarkable (mild hypoK likely 2/2 albuterol). Pt does have chest wall pain but this is not atypical given her h/o MM. Will give brief course of analgesics for chest wall pain 2/2 MM, steroids/abx for asthma/RAD/COPD exacerbation, and good return precautions.  Final Clinical Impressions(s) / ED Diagnoses   Final diagnoses:  Wheezing-associated respiratory infection (WARI)  Laryngitis    New Prescriptions Discharge Medication List as of 09/26/2016 10:31 PM    START taking these medications   Details  albuterol (PROVENTIL HFA;VENTOLIN HFA) 108 (90 Base) MCG/ACT inhaler Inhale 2 puffs into the lungs every 4 (four) hours as needed for wheezing or shortness of breath., Starting Wed 09/26/2016, Print    levofloxacin (LEVAQUIN) 750 MG tablet Take 1 tablet (750 mg total) by mouth daily., Starting Wed 09/26/2016, Until Mon 10/01/2016, Print    oxyCODONE-acetaminophen (PERCOCET/ROXICET) 5-325 MG tablet Take 1-2 tablets by mouth every 4 (four) hours as needed for severe pain., Starting Wed 09/26/2016, Print    predniSONE (DELTASONE) 20 MG tablet Take 3 tablets (60 mg total) by mouth daily., Starting Wed 09/26/2016, Until Mon 10/01/2016, Print         Duffy Bruce, MD 09/27/16 [phone]

## 2016-09-26 NOTE — ED Triage Notes (Signed)
Pt dx with bronchitis 5 days ago and felt SOB x 2 hours today. States she took her inhaler when fire dept arrived and she feels better. Alert and oriented. Hx of bone CA.

## 2016-09-27 ENCOUNTER — Other Ambulatory Visit: Payer: Self-pay | Admitting: Medical Oncology

## 2016-09-27 ENCOUNTER — Telehealth: Payer: Self-pay | Admitting: *Deleted

## 2016-09-27 DIAGNOSIS — C9001 Multiple myeloma in remission: Secondary | ICD-10-CM

## 2016-09-27 NOTE — Telephone Encounter (Signed)
Received call from pt requesting treatment as per ER provider from yesterday.  Spoke with pt, and was informed that pt went to ER yesterday due to not being able to breathe.  Stated she was given a breathing treatment;   antibiotics, and steroids for home meds. Pt was instructed by ER provider that she needed Zometa to help with bone issues - since pt has trouble walking. Noted that pt did not show for Zometa infusion on  08/08/16.   Last Zometa infusion was on  05/16/16. Message sent to Dr. Julien Nordmann and Shauna Hugh, RN desk nurse. Pt's    Phone    [phone].

## 2016-09-27 NOTE — Telephone Encounter (Signed)
She can reschedule her Zometa injection in the next week or 2

## 2016-09-27 NOTE — Telephone Encounter (Signed)
Low back pain and having trouble walking. Sees [facility]amed in [place] with myeloma panel.

## 2016-10-02 ENCOUNTER — Encounter: Payer: Self-pay | Admitting: Family Medicine

## 2016-10-02 ENCOUNTER — Ambulatory Visit: Payer: Medicare Other | Attending: Family Medicine | Admitting: Family Medicine

## 2016-10-02 VITALS — BP 141/101 | HR 92 | Temp 98.9°F | Ht 65.0 in | Wt 159.4 lb

## 2016-10-02 DIAGNOSIS — I10 Essential (primary) hypertension: Secondary | ICD-10-CM | POA: Diagnosis not present

## 2016-10-02 DIAGNOSIS — Z9049 Acquired absence of other specified parts of digestive tract: Secondary | ICD-10-CM | POA: Insufficient documentation

## 2016-10-02 DIAGNOSIS — J4520 Mild intermittent asthma, uncomplicated: Secondary | ICD-10-CM | POA: Diagnosis not present

## 2016-10-02 DIAGNOSIS — G8929 Other chronic pain: Secondary | ICD-10-CM | POA: Insufficient documentation

## 2016-10-02 DIAGNOSIS — M5442 Lumbago with sciatica, left side: Secondary | ICD-10-CM | POA: Insufficient documentation

## 2016-10-02 DIAGNOSIS — J4521 Mild intermittent asthma with (acute) exacerbation: Secondary | ICD-10-CM | POA: Insufficient documentation

## 2016-10-02 DIAGNOSIS — Z9851 Tubal ligation status: Secondary | ICD-10-CM | POA: Diagnosis not present

## 2016-10-02 DIAGNOSIS — M545 Low back pain: Secondary | ICD-10-CM | POA: Diagnosis not present

## 2016-10-02 DIAGNOSIS — C9001 Multiple myeloma in remission: Secondary | ICD-10-CM | POA: Insufficient documentation

## 2016-10-02 DIAGNOSIS — Z886 Allergy status to analgesic agent status: Secondary | ICD-10-CM | POA: Diagnosis not present

## 2016-10-02 DIAGNOSIS — E876 Hypokalemia: Secondary | ICD-10-CM | POA: Diagnosis not present

## 2016-10-02 MED ORDER — TRAMADOL HCL 50 MG PO TABS: 50.0000 mg | ORAL_TABLET | Freq: Two times a day (BID) | ORAL | 1 refills | Status: DC | PRN

## 2016-10-02 MED ORDER — METOPROLOL TARTRATE 25 MG PO TABS: 25.0000 mg | ORAL_TABLET | Freq: Two times a day (BID) | ORAL | 3 refills | Status: DC

## 2016-10-02 MED ORDER — AMLODIPINE BESYLATE 10 MG PO TABS: 10.0000 mg | ORAL_TABLET | Freq: Every day | ORAL | 5 refills | Status: DC

## 2016-10-02 MED ORDER — ALBUTEROL SULFATE HFA 108 (90 BASE) MCG/ACT IN AERS: 2.0000 | INHALATION_SPRAY | RESPIRATORY_TRACT | 3 refills | Status: DC | PRN

## 2016-10-02 NOTE — Progress Notes (Signed)
Medication refill Need pain meds for "bone cancer"

## 2016-10-02 NOTE — Patient Instructions (Signed)
Hypertension Hypertension, commonly called high blood pressure, is when the force of blood pumping through your arteries is too strong. Your arteries are the blood vessels that carry blood from your heart throughout your body. A blood pressure reading consists of a higher number over a lower number, such as 110/72. The higher number (systolic) is the pressure inside your arteries when your heart pumps. The lower number (diastolic) is the pressure inside your arteries when your heart relaxes. Ideally you want your blood pressure below 120/80. Hypertension forces your heart to work harder to pump blood. Your arteries may become narrow or stiff. Having untreated or uncontrolled hypertension can cause heart attack, stroke, kidney disease, and other problems. What increases the risk? Some risk factors for high blood pressure are controllable. Others are not. Risk factors you cannot control include:  Race. You may be at higher risk if you are African American.  Age. Risk increases with age.  Gender. Men are at higher risk than women before age 45 years. After age 65, women are at higher risk than men. Risk factors you can control include:  Not getting enough exercise or physical activity.  Being overweight.  Getting too much fat, sugar, calories, or salt in your diet.  Drinking too much alcohol. What are the signs or symptoms? Hypertension does not usually cause signs or symptoms. Extremely high blood pressure (hypertensive crisis) may cause headache, anxiety, shortness of breath, and nosebleed. How is this diagnosed? To check if you have hypertension, your health care provider will measure your blood pressure while you are seated, with your arm held at the level of your heart. It should be measured at least twice using the same arm. Certain conditions can cause a difference in blood pressure between your right and left arms. A blood pressure reading that is higher than normal on one occasion does  not mean that you need treatment. If it is not clear whether you have high blood pressure, you may be asked to return on a different day to have your blood pressure checked again. Or, you may be asked to monitor your blood pressure at home for 1 or more weeks. How is this treated? Treating high blood pressure includes making lifestyle changes and possibly taking medicine. Living a healthy lifestyle can help lower high blood pressure. You may need to change some of your habits. Lifestyle changes may include:  Following the DASH diet. This diet is high in fruits, vegetables, and whole grains. It is low in salt, red meat, and added sugars.  Keep your sodium intake below 2,300 mg per day.  Getting at least 30-45 minutes of aerobic exercise at least 4 times per week.  Losing weight if necessary.  Not smoking.  Limiting alcoholic beverages.  Learning ways to reduce stress. Your health care provider may prescribe medicine if lifestyle changes are not enough to get your blood pressure under control, and if one of the following is true:  You are 18-59 years of age and your systolic blood pressure is above 140.  You are 60 years of age or older, and your systolic blood pressure is above 150.  Your diastolic blood pressure is above 90.  You have diabetes, and your systolic blood pressure is over 140 or your diastolic blood pressure is over 90.  You have kidney disease and your blood pressure is above 140/90.  You have heart disease and your blood pressure is above 140/90. Your personal target blood pressure may vary depending on your medical   conditions, your age, and other factors. Follow these instructions at home:  Have your blood pressure rechecked as directed by your health care provider.  Take medicines only as directed by your health care provider. Follow the directions carefully. Blood pressure medicines must be taken as prescribed. The medicine does not work as well when you skip  doses. Skipping doses also puts you at risk for problems.  Do not smoke.  Monitor your blood pressure at home as directed by your health care provider. Contact a health care provider if:  You think you are having a reaction to medicines taken.  You have recurrent headaches or feel dizzy.  You have swelling in your ankles.  You have trouble with your vision. Get help right away if:  You develop a severe headache or confusion.  You have unusual weakness, numbness, or feel faint.  You have severe chest or abdominal pain.  You vomit repeatedly.  You have trouble breathing. This information is not intended to replace advice given to you by your health care provider. Make sure you discuss any questions you have with your health care provider. Document Released: 10/08/2005 Document Revised: 03/15/2016 Document Reviewed: 07/31/2013 Elsevier Interactive Patient Education  2017 Elsevier Inc.  

## 2016-10-02 NOTE — Progress Notes (Signed)
Subjective:  Patient ID: Ruth Sanchez, female    DOB: [birth year]  Age: 70 y.o. MRN: [id number]  CC: Hypertension; Multiple Myeloma; and Asthma   HPI Ruth Sanchez is 70 year old female with a history of Multiple Myeloma (managed by oncology and currently on Zometa injections every 3 months), chronic low back pain, Hypertension, intermittent Asthma here for a follow up visit.  She had a recent ED visit for asthma exacerbation where she was placed on Levaquin which she has completed and reports improvement in breathing; she denies chest pains or shortness of breath.  She continues to complain of low back pain and is requesting pain medications; She had been referred to [facility] for management of chronic pain  however she stopped going to see them. She informs me she was unable to provide them with a notarized document and also due to the huge co-pays. I had given her tramadol previously and then subsequently oxycodone but she presented earlier than normal for refill on her pills were short after which I have discontinued prescribing that for her. She informs me that her oncologist will not give her pain medications.  Past Medical History:  Diagnosis Date  . Arthritis   . Asthma   . Cancer ([place])   . Hypertension     Past Surgical History:  Procedure Laterality Date  . APPENDECTOMY    . CHOLECYSTECTOMY    . ESOPHAGOGASTRODUODENOSCOPY N/A 11/24/2015   Procedure: ESOPHAGOGASTRODUODENOSCOPY (EGD);  Surgeon: Clarene Essex, MD;  Location: [facility];  Service: Endoscopy;  Laterality: N/A;  . TUBAL LIGATION      Allergies  Allergen Reactions  . Gabapentin Swelling    Lips swelling   . Aspirin Itching and Rash     Outpatient Medications Prior to Visit  Medication Sig Dispense Refill  . acetaminophen (TYLENOL) 500 MG tablet Take 1,000 mg by mouth every 6 (six) hours as needed for mild pain, moderate pain, fever or headache.     Marland Kitchen amLODipine (NORVASC) 10 MG tablet Take 1  tablet (10 mg total) by mouth daily. 30 tablet 5  . metoprolol tartrate (LOPRESSOR) 25 MG tablet Take 1 tablet (25 mg total) by mouth 2 (two) times daily. 60 tablet 3  . Oxycodone HCl 10 MG TABS Take 0.5-1 tablets (5-10 mg total) by mouth every 12 (twelve) hours as needed. (Patient not taking: Reported on 10/02/2016) 20 tablet 0  . potassium chloride (K-DUR) 10 MEQ tablet Take 1 tablet (10 mEq total) by mouth daily. (Patient not taking: Reported on 10/02/2016) 5 tablet 0  . albuterol (PROVENTIL HFA;VENTOLIN HFA) 108 (90 Base) MCG/ACT inhaler Inhale 2 puffs into the lungs every 4 (four) hours as needed for wheezing or shortness of breath. (Patient not taking: Reported on 10/02/2016) 1 Inhaler 0  . oxyCODONE-acetaminophen (PERCOCET/ROXICET) 5-325 MG tablet Take 1-2 tablets by mouth every 4 (four) hours as needed for severe pain. (Patient not taking: Reported on 10/02/2016) 10 tablet 0   No facility-administered medications prior to visit.     ROS Review of Systems Constitutional: Negative for activity change, appetite change and fatigue.  HENT: Negative for congestion, sinus pressure and sore throat.   Eyes: Negative for visual disturbance.  Respiratory: Negative for cough, chest tightness, shortness of breath and wheezing.   Cardiovascular: Negative for chest pain and palpitations.  Gastrointestinal: Negative for abdominal pain, constipation and abdominal distention.  Endocrine: Negative for polydipsia.  Genitourinary: Negative for dysuria and frequency.  Musculoskeletal: Positive for back pain. Negative for  arthralgias.  Skin: Negative for rash.  Neurological: Negative for tremors, light-headedness and numbness.  Hematological: Does not bruise/bleed easily.  Psychiatric/Behavioral: Negative for behavioral problems and agitation.   Objective:  BP (!) 141/101 (BP Location: Right Arm, Patient Position: Sitting, Cuff Size: Large)   Pulse 92   Temp 98.9 F (37.2 C) (Oral)   Ht _0  (1.651  m)   Wt 159 lb 6.4 oz (72.3 kg)   SpO2 99%   BMI 26.53 kg/m   BP/Weight 10/02/2016 09/26/2016 19/10/4780  Systolic BP 956 213 086  Diastolic BP 578 89 469  Wt. (Lbs) 159.4 - -  BMI 26.53 - -      Physical Exam Constitutional: She is oriented to person, place, and time. She appears well-developed and well-nourished.  Cardiovascular: Normal heart sounds and intact distal pulses.  .   No murmur heard. Pulmonary/Chest: Effort normal and breath sounds normal. She has no wheezes. She has no rales. She exhibits no tenderness.  Abdominal: Soft. Bowel sounds are normal. She exhibits no distension and no mass. There is no tenderness.  Musculoskeletal: She exhibits tenderness (tendernes to palpation of lumbar spine).  Neurological: She is alert and oriented to person, place, and time.   BMET    Component Value Date/Time   NA 139 09/26/2016 2038   NA 142 05/09/2016 1342   K 3.3 (L) 09/26/2016 2038   K 3.3 (L) 05/09/2016 1342   CL 105 09/26/2016 2038   CL 104 01/19/2013 1058   CO2 25 09/26/2016 2038   CO2 27 05/09/2016 1342   GLUCOSE 96 09/26/2016 2038   GLUCOSE 78 05/09/2016 1342   GLUCOSE 81 01/19/2013 1058   BUN 19 09/26/2016 2038   BUN 11.7 05/09/2016 1342   CREATININE 0.64 09/26/2016 2038   CREATININE 0.8 05/09/2016 1342   CALCIUM 9.2 09/26/2016 2038   CALCIUM 9.3 05/09/2016 1342   GFRNONAA >60 09/26/2016 2038   GFRAA >60 09/26/2016 2038     Assessment & Plan:   1. Essential hypertension Slightly elevated above goal of less than 140/90 She has only been taking amlodipine and I have advised her to pick up metoprolol from the pharmacy Low-sodium diet - amLODipine (NORVASC) 10 MG tablet; Take 1 tablet (10 mg total) by mouth daily.  Dispense: 30 tablet; Refill: 5 - metoprolol tartrate (LOPRESSOR) 25 MG tablet; Take 1 tablet (25 mg total) by mouth 2 (two) times daily.  Dispense: 60 tablet; Refill: 3  2. Multiple myeloma in remission ([place]) Continue Zometa injections Keep  appointment with oncology  3. Chronic midline low back pain with left-sided sciatica Advised that she will need to call [facility] for an appointment - traMADol (ULTRAM) 50 MG tablet; Take 1 tablet (50 mg total) by mouth every 12 (twelve) hours as needed.  Dispense: 60 tablet; Refill: 1  4. Mild intermittent chronic asthma without complication Stable no acute exacerbation - albuterol (PROVENTIL HFA;VENTOLIN HFA) 108 (90 Base) MCG/ACT inhaler; Inhale 2 puffs into the lungs every 4 (four) hours as needed for wheezing or shortness of breath.  Dispense: 1 Inhaler; Refill: 3  5. Hypokalemia Previously on potassium pills We'll repeat labs - Basic Metabolic Panel   Meds ordered this encounter  Medications  . amLODipine (NORVASC) 10 MG tablet    Sig: Take 1 tablet (10 mg total) by mouth daily.    Dispense:  30 tablet    Refill:  5  . metoprolol tartrate (LOPRESSOR) 25 MG tablet    Sig: Take 1 tablet (  25 mg total) by mouth 2 (two) times daily.    Dispense:  60 tablet    Refill:  3  . albuterol (PROVENTIL HFA;VENTOLIN HFA) 108 (90 Base) MCG/ACT inhaler    Sig: Inhale 2 puffs into the lungs every 4 (four) hours as needed for wheezing or shortness of breath.    Dispense:  1 Inhaler    Refill:  3  . traMADol (ULTRAM) 50 MG tablet    Sig: Take 1 tablet (50 mg total) by mouth every 12 (twelve) hours as needed.    Dispense:  60 tablet    Refill:  1    Follow-up: Return in about 3 months (around 12/31/2016) for Follow-up on chronic medical conditions.   Arnoldo Morale MD

## 2016-10-03 ENCOUNTER — Telehealth: Payer: Self-pay | Admitting: Internal Medicine

## 2016-10-03 LAB — BASIC METABOLIC PANEL
BUN: 9 mg/dL (ref 7–25)
CALCIUM: 9.3 mg/dL (ref 8.6–10.4)
CO2: 25 mmol/L (ref 20–31)
Chloride: 105 mmol/L (ref 98–110)
Creat: 0.75 mg/dL (ref 0.60–0.93)
Glucose, Bld: 81 mg/dL (ref 65–99)
POTASSIUM: 3.5 mmol/L (ref 3.5–5.3)
SODIUM: 141 mmol/L (ref 135–146)

## 2016-10-03 NOTE — Telephone Encounter (Signed)
sw pt to confirm 10/23/16 appt date/time per LOS °

## 2016-10-04 ENCOUNTER — Telehealth: Payer: Self-pay

## 2016-10-04 NOTE — Telephone Encounter (Signed)
Writer called patient and LVM requesting patient to call back to discuss lab results. 

## 2016-10-04 NOTE — Telephone Encounter (Signed)
-----   Message from Arnoldo Morale, MD sent at 10/03/2016  1:52 PM EST ----- Please inform the patient that labs are normal. Thank you.

## 2016-10-05 ENCOUNTER — Telehealth: Payer: Self-pay

## 2016-10-05 NOTE — Telephone Encounter (Signed)
Writer called and spoke with patient per Dr.Amao to discuss her lab results.  Patient stated understanding.

## 2016-10-05 NOTE — Telephone Encounter (Signed)
-----   Message from Arnoldo Morale, MD sent at 10/03/2016  1:52 PM EST ----- Please inform the patient that labs are normal. Thank you.

## 2016-10-08 ENCOUNTER — Ambulatory Visit (HOSPITAL_BASED_OUTPATIENT_CLINIC_OR_DEPARTMENT_OTHER): Payer: Medicare Other

## 2016-10-08 ENCOUNTER — Other Ambulatory Visit (HOSPITAL_BASED_OUTPATIENT_CLINIC_OR_DEPARTMENT_OTHER): Payer: Medicare Other

## 2016-10-08 VITALS — BP 136/77 | HR 74 | Temp 98.4°F | Resp 18

## 2016-10-08 DIAGNOSIS — C9001 Multiple myeloma in remission: Secondary | ICD-10-CM

## 2016-10-08 LAB — BASIC METABOLIC PANEL
Anion Gap: 10 mEq/L (ref 3–11)
BUN: 7.2 mg/dL (ref 7.0–26.0)
CALCIUM: 9.4 mg/dL (ref 8.4–10.4)
CO2: 28 meq/L (ref 22–29)
CREATININE: 0.7 mg/dL (ref 0.6–1.1)
Chloride: 100 mEq/L (ref 98–109)
Glucose: 108 mg/dl (ref 70–140)
Potassium: 3.5 mEq/L (ref 3.5–5.1)
SODIUM: 139 meq/L (ref 136–145)

## 2016-10-08 MED ORDER — ZOLEDRONIC ACID 4 MG/100ML IV SOLN
4.0000 mg | Freq: Once | INTRAVENOUS | Status: AC
  Administered 2016-10-08: 4 mg via INTRAVENOUS
  Filled 2016-10-08: qty 100

## 2016-10-08 NOTE — Progress Notes (Signed)
Patient rated pain 10/10 for back.  States only takes Tylenol for pain control.  Patient asked if I would call doctor for "prescription for 10 tablets" of pain medication.  Reviewed current medications and found that patient had Tramadol and Oxycodone.  Called Pharmacy on file to confirm that patient had gotten prescriptions filled because she stated she didn't recall having any pain medication prescriptions.  Pharmacist confirmed that patient had Percocet, Norco and oxycodone that were prescribed which should have been completed but patient should still have Tramadol 60 tablets that was filled on 10/02/2016 remaining.    Advised patient that once that prescription was completed then she should call Primary MD to request refill since he was the original prescriber.    Patient states she understands, no further questions or complaints

## 2016-10-08 NOTE — Patient Instructions (Signed)

## 2016-10-31 ENCOUNTER — Other Ambulatory Visit: Payer: Self-pay | Admitting: Family Medicine

## 2016-10-31 DIAGNOSIS — M5442 Lumbago with sciatica, left side: Principal | ICD-10-CM

## 2016-10-31 DIAGNOSIS — G8929 Other chronic pain: Secondary | ICD-10-CM

## 2016-11-07 ENCOUNTER — Telehealth: Payer: Self-pay | Admitting: Internal Medicine

## 2016-11-07 ENCOUNTER — Other Ambulatory Visit: Payer: Medicare Other

## 2016-11-07 NOTE — Telephone Encounter (Signed)
Schedule request sent to r/s labs

## 2016-11-07 NOTE — Telephone Encounter (Signed)
Needs to cancel her appointments today due to inclement weather she needs to reschedule and her call back number is [phone]

## 2016-11-08 ENCOUNTER — Telehealth: Payer: Self-pay | Admitting: Internal Medicine

## 2016-11-08 NOTE — Telephone Encounter (Signed)
Left message for pt re 1/19 lab

## 2016-11-09 ENCOUNTER — Other Ambulatory Visit: Payer: Medicare Other

## 2016-11-14 ENCOUNTER — Other Ambulatory Visit: Payer: Medicare Other

## 2016-11-14 ENCOUNTER — Ambulatory Visit (HOSPITAL_BASED_OUTPATIENT_CLINIC_OR_DEPARTMENT_OTHER): Payer: Medicare Other

## 2016-11-14 ENCOUNTER — Telehealth: Payer: Self-pay | Admitting: Internal Medicine

## 2016-11-14 ENCOUNTER — Ambulatory Visit (HOSPITAL_BASED_OUTPATIENT_CLINIC_OR_DEPARTMENT_OTHER): Payer: Medicare Other | Admitting: Internal Medicine

## 2016-11-14 ENCOUNTER — Encounter: Payer: Self-pay | Admitting: Internal Medicine

## 2016-11-14 VITALS — BP 140/98 | HR 102 | Temp 98.7°F | Resp 17 | Ht 65.0 in | Wt 157.3 lb

## 2016-11-14 DIAGNOSIS — C9001 Multiple myeloma in remission: Secondary | ICD-10-CM

## 2016-11-14 NOTE — Telephone Encounter (Signed)
Appointments scheduled per 11/14/16 los. Patient was given a copy of the appointment schedule and AVS report, per 11/14/16 los.  °

## 2016-11-14 NOTE — Progress Notes (Signed)
[facility] Telephone:([phone]   Fax:(336) [phone]  OFFICE PROGRESS NOTE  Arnoldo Morale, MD [place] [id number]  DIAGNOSIS: Multiple myeloma, IgG kappa subtype diagnosed in October 2008.   PRIOR THERAPY:  1. Status post radiotherapy to the lower lumbar spine and left pelvic area under the care of Dr. Sondra Come. The patient received a total dose of 3000 cGy. in 15 fractions between 10/14/2007 through 11/07/2007  2.Status post 6 cycles of systemic chemotherapy with Revlimid and Decadron. Last dose given April 2009.  3. status post autologous peripheral blood stem cell transplant on 04/01/2008 under the care of Dr. Valarie Merino at [facility].   CURRENT THERAPY: Zometa 4 mg IV given every 3 months.  INTERVAL HISTORY: Ruth Sanchez 71 y.o. female came to the clinic today accompanied by her husband for follow-up visit. The patient is currently on observation. She is feeling fine with no specific complaints except for the chronic back pain. She takes Tylenol an as-needed basis. She denied having any chest pain, shortness of breath, cough or hemoptysis. She denied having any weight loss or night sweats. She has no fever or chills. She denied having any significant nausea or vomiting. She continues on treatment with Zometa every 3 months, last dose was given 10/08/2016. She had repeat myeloma panel performed recently and she is here for evaluation and discussion of her lab results.  MEDICAL HISTORY: Past Medical History:  Diagnosis Date  . Arthritis   . Asthma   . Cancer ([place])   . Hypertension     ALLERGIES:  is allergic to gabapentin and aspirin.  MEDICATIONS:  Current Outpatient Prescriptions  Medication Sig Dispense Refill  . acetaminophen (TYLENOL) 500 MG tablet Take 1,000 mg by mouth every 6 (six) hours as needed for mild pain, moderate pain, fever or headache.     . albuterol (PROVENTIL HFA;VENTOLIN HFA) 108 (90 Base) MCG/ACT inhaler  Inhale 2 puffs into the lungs every 4 (four) hours as needed for wheezing or shortness of breath. 1 Inhaler 3  . amLODipine (NORVASC) 10 MG tablet Take 1 tablet (10 mg total) by mouth daily. 30 tablet 5  . metoprolol tartrate (LOPRESSOR) 25 MG tablet Take 1 tablet (25 mg total) by mouth 2 (two) times daily. 60 tablet 3  . Oxycodone HCl 10 MG TABS Take 0.5-1 tablets (5-10 mg total) by mouth every 12 (twelve) hours as needed. 20 tablet 0  . potassium chloride (K-DUR) 10 MEQ tablet Take 1 tablet (10 mEq total) by mouth daily. 5 tablet 0  . traMADol (ULTRAM) 50 MG tablet Take 1 tablet (50 mg total) by mouth every 12 (twelve) hours as needed. 60 tablet 1   No current facility-administered medications for this visit.     SURGICAL HISTORY:  Past Surgical History:  Procedure Laterality Date  . APPENDECTOMY    . CHOLECYSTECTOMY    . ESOPHAGOGASTRODUODENOSCOPY N/A 11/24/2015   Procedure: ESOPHAGOGASTRODUODENOSCOPY (EGD);  Surgeon: Clarene Essex, MD;  Location: [facility];  Service: Endoscopy;  Laterality: N/A;  . TUBAL LIGATION      REVIEW OF SYSTEMS:  A comprehensive review of systems was negative except for: Musculoskeletal: positive for back pain   PHYSICAL EXAMINATION: General appearance: alert, cooperative, fatigued and no distress Head: Normocephalic, without obvious abnormality, atraumatic Neck: no adenopathy Lymph nodes: Cervical, supraclavicular, and axillary nodes normal. Resp: clear to auscultation bilaterally Cardio: regular rate and rhythm, S1, S2 normal, no murmur, click, rub or gallop GI:  soft, non-tender; bowel sounds normal; no masses,  no organomegaly Extremities: extremities normal, atraumatic, no cyanosis or edema   ECOG PERFORMANCE STATUS: 1 - Symptomatic but completely ambulatory  Blood pressure (!) 140/98, pulse (!) 102, temperature 98.7 F (37.1 C), temperature source Oral, resp. rate 17, height '5\' 5"'  (1.651 m), weight 157 lb 4.8 oz (71.4 kg), SpO2 99 %.  LABORATORY  DATA: Lab Results  Component Value Date   WBC 9.0 09/26/2016   HGB 11.6 (L) 09/26/2016   HCT 34.7 (L) 09/26/2016   MCV 90.4 09/26/2016   PLT 151 09/26/2016      Chemistry      Component Value Date/Time   NA 139 10/08/2016 0931   K 3.5 10/08/2016 0931   CL 105 10/02/2016 1013   CL 104 01/19/2013 1058   CO2 28 10/08/2016 0931   BUN 7.2 10/08/2016 0931   CREATININE 0.7 10/08/2016 0931      Component Value Date/Time   CALCIUM 9.4 10/08/2016 0931   ALKPHOS 107 09/23/2016 1900   ALKPHOS 107 05/09/2016 1342   AST 24 09/23/2016 1900   AST 20 05/09/2016 1342   ALT 12 (L) 09/23/2016 1900   ALT 15 05/09/2016 1342   BILITOT 1.1 09/23/2016 1900   BILITOT 0.66 05/09/2016 1342     Other lab results:   RADIOGRAPHIC STUDIES: No results found.  ASSESSMENT AND PLAN: This is a very pleasant 71 years old African American female with history of multiple myeloma status post systemic chemotherapy with Revlimid and Decadron followed by this peripheral blood stem cell transplant and has been observation since June of 2009 with no significant evidence for disease progression.  The recent Myeloma panel showed no concerning abnormalities. I discussed the lab result with the patient today and recommended for her to continue on observation with repeat myeloma panel in 6 months.  She will continue her treatment with Zometa every 3 months. She was advised to call immediately if she has any concerning symptoms in the interval.  All questions were answered. The patient knows to call the clinic with any problems, questions or concerns. We can certainly see the patient much sooner if necessary. I spent 10 minutes counseling the patient face to face. The total time spent in the appointment was 15 minutes. Disclaimer: This note was dictated with voice recognition software. Similar sounding words can inadvertently be transcribed and may not be corrected upon review.

## 2016-11-29 ENCOUNTER — Ambulatory Visit: Payer: Medicare Other | Admitting: Physician Assistant

## 2016-11-30 ENCOUNTER — Ambulatory Visit: Payer: Medicare Other | Attending: Physician Assistant | Admitting: Physician Assistant

## 2016-11-30 ENCOUNTER — Encounter: Payer: Self-pay | Admitting: Physician Assistant

## 2016-11-30 VITALS — BP 150/101 | HR 91 | Temp 99.5°F | Resp 20 | Wt 146.9 lb

## 2016-11-30 DIAGNOSIS — M5442 Lumbago with sciatica, left side: Secondary | ICD-10-CM

## 2016-11-30 DIAGNOSIS — G8929 Other chronic pain: Secondary | ICD-10-CM

## 2016-11-30 DIAGNOSIS — R05 Cough: Secondary | ICD-10-CM | POA: Insufficient documentation

## 2016-11-30 DIAGNOSIS — I10 Essential (primary) hypertension: Secondary | ICD-10-CM | POA: Insufficient documentation

## 2016-11-30 DIAGNOSIS — J069 Acute upper respiratory infection, unspecified: Secondary | ICD-10-CM

## 2016-11-30 DIAGNOSIS — R059 Cough, unspecified: Secondary | ICD-10-CM

## 2016-11-30 MED ORDER — TRAMADOL HCL 50 MG PO TABS: 50.0000 mg | ORAL_TABLET | Freq: Two times a day (BID) | ORAL | 1 refills | Status: DC | PRN

## 2016-11-30 MED ORDER — METOPROLOL TARTRATE 25 MG PO TABS: 25.0000 mg | ORAL_TABLET | Freq: Two times a day (BID) | ORAL | 3 refills | Status: DC

## 2016-11-30 MED ORDER — DM-APAP-CPM 15-500-2 MG PO TABS: 2.0000 | ORAL_TABLET | Freq: Four times a day (QID) | ORAL | 0 refills | Status: AC

## 2016-11-30 MED ORDER — BENZONATATE 100 MG PO CAPS: 100.0000 mg | ORAL_CAPSULE | Freq: Two times a day (BID) | ORAL | 0 refills | Status: DC | PRN

## 2016-11-30 MED ORDER — LOSARTAN POTASSIUM 25 MG PO TABS: 25.0000 mg | ORAL_TABLET | Freq: Every day | ORAL | 0 refills | Status: DC

## 2016-11-30 NOTE — Progress Notes (Signed)
Subjective:  Patient ID: Ruth Sanchez, female    DOB: April 09, 1946  Age: 71 y.o. MRN: [id number]  CC: No chief complaint on file.   HPI Ruth Sanchez is a 71 y.o. female with a PMH of   Past Medical History:  Diagnosis Date  . Arthritis   . Asthma   . Cancer ([place])   . Hypertension     that presents with a 4 week history of cough, sneezing, and headache. Headache is chronic but worse when coughing. Cough is present throughout the day but moreso at night. Has taken eaten a certain food to intentionally induce a fever and said she felt better temporarily afterwards. However, symptoms persist. Denies chest pain, SOB, abdominal pain, fever, chills, nausea, vomiting, rash, tingling, numbness, or GI/GU sxs.    Outpatient Medications Prior to Visit  Medication Sig Dispense Refill  . acetaminophen (TYLENOL) 500 MG tablet Take 1,000 mg by mouth every 6 (six) hours as needed for mild pain, moderate pain, fever or headache.     . albuterol (PROVENTIL HFA;VENTOLIN HFA) 108 (90 Base) MCG/ACT inhaler Inhale 2 puffs into the lungs every 4 (four) hours as needed for wheezing or shortness of breath. 1 Inhaler 3  . traMADol (ULTRAM) 50 MG tablet Take 1 tablet (50 mg total) by mouth every 12 (twelve) hours as needed. 60 tablet 1  . amLODipine (NORVASC) 10 MG tablet Take 1 tablet (10 mg total) by mouth daily. (Patient not taking: Reported on 11/30/2016) 30 tablet 5  . Oxycodone HCl 10 MG TABS Take 0.5-1 tablets (5-10 mg total) by mouth every 12 (twelve) hours as needed. (Patient not taking: Reported on 11/30/2016) 20 tablet 0  . potassium chloride (K-DUR) 10 MEQ tablet Take 1 tablet (10 mEq total) by mouth daily. (Patient not taking: Reported on 11/30/2016) 5 tablet 0  . metoprolol tartrate (LOPRESSOR) 25 MG tablet Take 1 tablet (25 mg total) by mouth 2 (two) times daily. (Patient not taking: Reported on 11/30/2016) 60 tablet 3   No facility-administered medications prior to visit.      ROS Review of  Systems  Constitutional: Negative for chills, fever and malaise/fatigue.  HENT: Positive for congestion. Negative for ear pain, sinus pain and sore throat.   Respiratory: Positive for cough. Negative for shortness of breath and wheezing.   Cardiovascular: Negative for chest pain and leg swelling.  Gastrointestinal: Negative for abdominal pain, nausea and vomiting.  Genitourinary: Negative for dysuria.  Musculoskeletal: Positive for back pain (chronic).  Skin: Negative for rash.  Neurological: Positive for headaches (chronic). Negative for dizziness and weakness.    Objective:  BP (!) 150/101 (BP Location: Left Arm, Patient Position: Sitting, Cuff Size: Large)   Pulse 91   Temp 99.5 F (37.5 C) (Oral)   Resp 20   Wt 146 lb 14.4 oz (66.6 kg)   SpO2 96%   BMI 24.45 kg/m   BP/Weight 11/30/2016 11/14/2016 [id number]  Systolic BP [id number]  Diastolic BP 99991111 98 77  Wt. (Lbs) 146.9 157.3 -  BMI 24.45 26.18 -      Physical Exam  Constitutional: She is oriented to person, place, and time.  NAD, elderly, polite, wearing an elastic back brace  HENT:  Head: Normocephalic and atraumatic.  Oropharynx with mild postnasal drip and with cobblestoning changes. TMs normal, Turbinates mildly hypertrophic and erythematous.  Eyes: Conjunctivae are normal. No scleral icterus.  Neck: No thyromegaly present.  Cardiovascular: Normal rate, regular rhythm and normal heart sounds.  Pulmonary/Chest: Effort normal and breath sounds normal.  Occasional mild cough  Musculoskeletal: She exhibits no edema.  Lymphadenopathy:    She has no cervical adenopathy.  Neurological: She is alert and oriented to person, place, and time.  Skin: Skin is warm and dry. No rash noted. She is not diaphoretic. No erythema.  Psychiatric: She has a normal mood and affect. Her behavior is normal.     Assessment & Plan:   1. Cough Likely sequelae of URI  2. Upper respiratory tract infection, unspecified type   3.  Essential hypertension -Added Losartan. Patient has been hypertensive over that last few visits despite antihypertensive use.  4. Chronic midline low back pain with left-sided sciatica -Not seen for this condition today. Medication was refilled for this chronic condition. Patient counseled about narcotic use and possible addiction potential. - traMADol (ULTRAM) 50 MG tablet; Take 1 tablet (50 mg total) by mouth every 12 (twelve) hours as needed.  Dispense: 60 tablet; Refill: 1   Meds ordered this encounter  Medications  . losartan (COZAAR) 25 MG tablet    Sig: Take 1 tablet (25 mg total) by mouth daily.    Dispense:  30 tablet    Refill:  0    Order Specific Question:   Supervising Provider    Answer:   Tresa Garter [id number]  . metoprolol tartrate (LOPRESSOR) 25 MG tablet    Sig: Take 1 tablet (25 mg total) by mouth 2 (two) times daily.    Dispense:  180 tablet    Refill:  3    Order Specific Question:   Supervising Provider    Answer:   Tresa Garter [id number]  . benzonatate (TESSALON) 100 MG capsule    Sig: Take 1 capsule (100 mg total) by mouth 2 (two) times daily as needed for cough.    Dispense:  20 capsule    Refill:  0    Order Specific Question:   Supervising Provider    Answer:   Tresa Garter [id number]  . traMADol (ULTRAM) 50 MG tablet    Sig: Take 1 tablet (50 mg total) by mouth every 12 (twelve) hours as needed.    Dispense:  60 tablet    Refill:  1    Order Specific Question:   Supervising Provider    Answer:   Tresa Garter [id number]  . DM-APAP-CPM (CORICIDIN HBP FLU) 15-500-2 MG TABS    Sig: Take 2 tablets by mouth every 6 (six) hours.    Dispense:  1 each    Refill:  0    Order Specific Question:   Supervising Provider    Answer:   Tresa Garter [id number]    Follow-up: Return in about 4 weeks (around 12/28/2016) for HTN.   Clent Demark PA

## 2016-11-30 NOTE — Progress Notes (Signed)
Intermittent diarrhea Not feeling well cough

## 2016-11-30 NOTE — Patient Instructions (Addendum)
Please call here or go to the emergency room if your symptoms worsen. The phone number here is [phone]. Return in one month to follow up on your hypertension.   Upper Respiratory Infection, Adult Most upper respiratory infections (URIs) are a viral infection of the air passages leading to the lungs. A URI affects the nose, throat, and upper air passages. The most common type of URI is nasopharyngitis and is typically referred to as "the common cold." URIs run their course and usually go away on their own. Most of the time, a URI does not require medical attention, but sometimes a bacterial infection in the upper airways can follow a viral infection. This is called a secondary infection. Sinus and middle ear infections are common types of secondary upper respiratory infections. Bacterial pneumonia can also complicate a URI. A URI can worsen asthma and chronic obstructive pulmonary disease (COPD). Sometimes, these complications can require emergency medical care and may be life threatening. What are the causes? Almost all URIs are caused by viruses. A virus is a type of germ and can spread from one person to another. What increases the risk? You may be at risk for a URI if:  You smoke.  You have chronic heart or lung disease.  You have a weakened defense (immune) system.  You are very young or very old.  You have nasal allergies or asthma.  You work in crowded or poorly ventilated areas.  You work in health care facilities or schools. What are the signs or symptoms? Symptoms typically develop 2-3 days after you come in contact with a cold virus. Most viral URIs last 7-10 days. However, viral URIs from the influenza virus (flu virus) can last 14-18 days and are typically more severe. Symptoms may include:  Runny or stuffy (congested) nose.  Sneezing.  Cough.  Sore throat.  Headache.  Fatigue.  Fever.  Loss of appetite.  Pain in your forehead, behind your eyes, and over  your cheekbones (sinus pain).  Muscle aches. How is this diagnosed? Your health care provider may diagnose a URI by:  Physical exam.  Tests to check that your symptoms are not due to another condition such as:  Strep throat.  Sinusitis.  Pneumonia.  Asthma. How is this treated? A URI goes away on its own with time. It cannot be cured with medicines, but medicines may be prescribed or recommended to relieve symptoms. Medicines may help:  Reduce your fever.  Reduce your cough.  Relieve nasal congestion. Follow these instructions at home:  Take medicines only as directed by your health care provider.  Gargle warm saltwater or take cough drops to comfort your throat as directed by your health care provider.  Use a warm mist humidifier or inhale steam from a shower to increase air moisture. This may make it easier to breathe.  Drink enough fluid to keep your urine clear or pale yellow.  Eat soups and other clear broths and maintain good nutrition.  Rest as needed.  Return to work when your temperature has returned to normal or as your health care provider advises. You may need to stay home longer to avoid infecting others. You can also use a face mask and careful hand washing to prevent spread of the virus.  Increase the usage of your inhaler if you have asthma.  Do not use any tobacco products, including cigarettes, chewing tobacco, or electronic cigarettes. If you need help quitting, ask your health care provider. How is this prevented? The best  way to protect yourself from getting a cold is to practice good hygiene.  Avoid oral or hand contact with people with cold symptoms.  Wash your hands often if contact occurs. There is no clear evidence that vitamin C, vitamin E, echinacea, or exercise reduces the chance of developing a cold. However, it is always recommended to get plenty of rest, exercise, and practice good nutrition. Contact a health care provider if:  You  are getting worse rather than better.  Your symptoms are not controlled by medicine.  You have chills.  You have worsening shortness of breath.  You have brown or red mucus.  You have yellow or brown nasal discharge.  You have pain in your face, especially when you bend forward.  You have a fever.  You have swollen neck glands.  You have pain while swallowing.  You have white areas in the back of your throat. Get help right away if:  You have severe or persistent:  Headache.  Ear pain.  Sinus pain.  Chest pain.  You have chronic lung disease and any of the following:  Wheezing.  Prolonged cough.  Coughing up blood.  A change in your usual mucus.  You have a stiff neck.  You have changes in your:  Vision.  Hearing.  Thinking.  Mood. This information is not intended to replace advice given to you by your health care provider. Make sure you discuss any questions you have with your health care provider. Document Released: 04/03/2001 Document Revised: 06/10/2016 Document Reviewed: 01/13/2014 Elsevier Interactive Patient Education  2017 [employer].

## 2016-12-10 ENCOUNTER — Emergency Department (HOSPITAL_COMMUNITY): Payer: Medicare Other

## 2016-12-10 ENCOUNTER — Encounter (HOSPITAL_COMMUNITY): Payer: Self-pay

## 2016-12-10 ENCOUNTER — Emergency Department (HOSPITAL_COMMUNITY)
Admission: EM | Admit: 2016-12-10 | Discharge: 2016-12-10 | Disposition: A | Discharge status: Home / Self Care | Payer: Medicare Other | Attending: Emergency Medicine | Admitting: Emergency Medicine

## 2016-12-10 DIAGNOSIS — R6889 Other general symptoms and signs: Secondary | ICD-10-CM

## 2016-12-10 DIAGNOSIS — I10 Essential (primary) hypertension: Secondary | ICD-10-CM | POA: Insufficient documentation

## 2016-12-10 DIAGNOSIS — Z859 Personal history of malignant neoplasm, unspecified: Secondary | ICD-10-CM | POA: Diagnosis not present

## 2016-12-10 DIAGNOSIS — Z79899 Other long term (current) drug therapy: Secondary | ICD-10-CM | POA: Insufficient documentation

## 2016-12-10 DIAGNOSIS — R509 Fever, unspecified: Secondary | ICD-10-CM | POA: Diagnosis present

## 2016-12-10 DIAGNOSIS — J111 Influenza due to unidentified influenza virus with other respiratory manifestations: Secondary | ICD-10-CM | POA: Diagnosis not present

## 2016-12-10 DIAGNOSIS — R8271 Bacteriuria: Secondary | ICD-10-CM | POA: Insufficient documentation

## 2016-12-10 DIAGNOSIS — R0602 Shortness of breath: Secondary | ICD-10-CM | POA: Diagnosis not present

## 2016-12-10 DIAGNOSIS — R000 Tachycardia, unspecified: Secondary | ICD-10-CM | POA: Diagnosis not present

## 2016-12-10 DIAGNOSIS — J45909 Unspecified asthma, uncomplicated: Secondary | ICD-10-CM | POA: Diagnosis not present

## 2016-12-10 DIAGNOSIS — R05 Cough: Secondary | ICD-10-CM | POA: Diagnosis not present

## 2016-12-10 DIAGNOSIS — R8281 Pyuria: Secondary | ICD-10-CM

## 2016-12-10 DIAGNOSIS — N390 Urinary tract infection, site not specified: Secondary | ICD-10-CM | POA: Diagnosis not present

## 2016-12-10 LAB — CBC WITH DIFFERENTIAL/PLATELET
BASOS ABS: 0 10*3/uL (ref 0.0–0.1)
Basophils Relative: 0 %
EOS PCT: 1 %
Eosinophils Absolute: 0.2 10*3/uL (ref 0.0–0.7)
HEMATOCRIT: 40.4 % (ref 36.0–46.0)
Hemoglobin: 13.5 g/dL (ref 12.0–15.0)
LYMPHS ABS: 2.4 10*3/uL (ref 0.7–4.0)
LYMPHS PCT: 19 %
MCH: 29.9 pg (ref 26.0–34.0)
MCHC: 33.4 g/dL (ref 30.0–36.0)
MCV: 89.4 fL (ref 78.0–100.0)
MONO ABS: 1.1 10*3/uL — AB (ref 0.1–1.0)
Monocytes Relative: 9 %
Neutro Abs: 9.4 10*3/uL — ABNORMAL HIGH (ref 1.7–7.7)
Neutrophils Relative %: 71 %
Platelets: 137 10*3/uL — ABNORMAL LOW (ref 150–400)
RBC: 4.52 MIL/uL (ref 3.87–5.11)
RDW: 13.5 % (ref 11.5–15.5)
WBC: 13.2 10*3/uL — ABNORMAL HIGH (ref 4.0–10.5)

## 2016-12-10 LAB — COMPREHENSIVE METABOLIC PANEL
ALBUMIN: 4.1 g/dL (ref 3.5–5.0)
ALT: 14 U/L (ref 14–54)
ANION GAP: 9 (ref 5–15)
AST: 21 U/L (ref 15–41)
Alkaline Phosphatase: 96 U/L (ref 38–126)
BUN: 9 mg/dL (ref 6–20)
CHLORIDE: 108 mmol/L (ref 101–111)
CO2: 25 mmol/L (ref 22–32)
Calcium: 9.7 mg/dL (ref 8.9–10.3)
Creatinine, Ser: 0.61 mg/dL (ref 0.44–1.00)
GFR calc Af Amer: >60 mL/min (ref >60)
GFR calc non Af Amer: >60 mL/min (ref >60)
GLUCOSE: 95 mg/dL (ref 65–99)
POTASSIUM: 3 mmol/L — AB (ref 3.5–5.1)
SODIUM: 142 mmol/L (ref 135–145)
TOTAL PROTEIN: 7.8 g/dL (ref 6.5–8.1)
Total Bilirubin: 1 mg/dL (ref 0.3–1.2)

## 2016-12-10 LAB — URINALYSIS, ROUTINE W REFLEX MICROSCOPIC
Bilirubin Urine: NEGATIVE
Glucose, UA: NEGATIVE mg/dL
Hgb urine dipstick: NEGATIVE
KETONES UR: NEGATIVE mg/dL
Nitrite: NEGATIVE
Protein, ur: NEGATIVE mg/dL
Specific Gravity, Urine: 1.013 (ref 1.005–1.030)
pH: 6 (ref 5.0–8.0)

## 2016-12-10 LAB — I-STAT CG4 LACTIC ACID, ED: LACTIC ACID, VENOUS: 1.88 mmol/L (ref 0.5–1.9)

## 2016-12-10 MED ORDER — AZITHROMYCIN 200 MG/5ML PO SUSR
200.0000 mg | Freq: Every day | ORAL | 0 refills | Status: AC
Start: 2016-12-10 — End: 2016-12-15

## 2016-12-10 MED ORDER — ALBUTEROL SULFATE (2.5 MG/3ML) 0.083% IN NEBU
5.0000 mg | INHALATION_SOLUTION | Freq: Once | RESPIRATORY_TRACT | Status: AC
  Administered 2016-12-10: 5 mg via RESPIRATORY_TRACT
  Filled 2016-12-10: qty 6

## 2016-12-10 MED ORDER — SODIUM CHLORIDE 0.9 % IV BOLUS (SEPSIS)
1000.0000 mL | Freq: Once | INTRAVENOUS | Status: AC
  Administered 2016-12-10: 1000 mL via INTRAVENOUS

## 2016-12-10 MED ORDER — CEPHALEXIN 250 MG/5ML PO SUSR: 500.0000 mg | Freq: Three times a day (TID) | ORAL | 0 refills | Status: AC

## 2016-12-10 MED ORDER — ACETAMINOPHEN 160 MG/5ML PO SOLN
1000.0000 mg | Freq: Once | ORAL | Status: AC
  Administered 2016-12-10: 1000 mg via ORAL
  Filled 2016-12-10: qty 40.6

## 2016-12-10 MED ORDER — DEXTROSE 5 % IV SOLN
1.0000 g | Freq: Once | INTRAVENOUS | Status: AC
  Administered 2016-12-10: 1 g via INTRAVENOUS
  Filled 2016-12-10: qty 10

## 2016-12-10 MED ORDER — ACETAMINOPHEN 500 MG PO TABS: 1000.0000 mg | ORAL_TABLET | Freq: Once | ORAL | Status: DC

## 2016-12-10 MED ORDER — DEXTROMETHORPHAN POLISTIREX ER 30 MG/5ML PO SUER
30.0000 mg | Freq: Once | ORAL | Status: AC
  Administered 2016-12-10: 30 mg via ORAL
  Filled 2016-12-10: qty 5

## 2016-12-10 NOTE — ED Provider Notes (Signed)
[facility] DEPT Provider Note   CSN: [id number] Arrival date & time: 12/10/16  1545     History   Chief Complaint Chief Complaint  Patient presents with  . Influenza  . Shortness of Breath    HPI Ruth Sanchez is a 71 y.o. female.  The history is provided by the patient.  Cough  This is a new problem. Episode onset: 2 weeks ago. The problem occurs constantly. The problem has not changed since onset.The cough is non-productive. There has been no fever. Associated symptoms include chills. Associated symptoms comments: Body aches. She has tried nothing for the symptoms. She is not a smoker. Her past medical history is significant for bronchitis.    Past Medical History:  Diagnosis Date  . Arthritis   . Asthma   . Cancer ([place])   . Hypertension     Patient Active Problem List   Diagnosis Date Noted  . Urinary incontinence 08/03/2015  . Essential hypertension 05/13/2015  . Chronic back pain 05/13/2015  . Acute bronchitis 02/23/2013  . Dyspnea 02/23/2013  . Paroxysmal atrial fibrillation ([place]) 02/23/2013  . Arthritis 02/23/2013  . Goiter with tracheal compression and mild stridor 02/23/2013  . Asthma, chronic 02/23/2013  . Hypokalemia 02/23/2013  . Multiple myeloma in remission ([place]) 11/18/2011    Past Surgical History:  Procedure Laterality Date  . APPENDECTOMY    . CHOLECYSTECTOMY    . ESOPHAGOGASTRODUODENOSCOPY N/A 11/24/2015   Procedure: ESOPHAGOGASTRODUODENOSCOPY (EGD);  Surgeon: Clarene Essex, MD;  Location: [facility];  Service: Endoscopy;  Laterality: N/A;  . TUBAL LIGATION      OB History    No data available       Home Medications    Prior to Admission medications   Medication Sig Start Date End Date Taking? Authorizing Provider  acetaminophen (TYLENOL) 500 MG tablet Take 500-1,000 mg by mouth every 6 (six) hours as needed for mild pain, moderate pain or headache.   Yes Historical Provider, MD  albuterol (PROVENTIL HFA;VENTOLIN HFA) 108 (90  Base) MCG/ACT inhaler Inhale 2 puffs into the lungs every 4 (four) hours as needed for wheezing or shortness of breath. 10/02/16  Yes Arnoldo Morale, MD  losartan (COZAAR) 25 MG tablet Take 1 tablet (25 mg total) by mouth daily. 11/30/16 12/30/16 Yes Roger Roderic Ovens, PA  metoprolol tartrate (LOPRESSOR) 25 MG tablet Take 1 tablet (25 mg total) by mouth 2 (two) times daily. 11/30/16  Yes Roger Roderic Ovens, PA  potassium chloride (K-DUR) 10 MEQ tablet Take 1 tablet (10 mEq total) by mouth daily. 08/31/16  Yes Dorie Rank, MD    Family History Family History  Problem Relation Age of Onset  . Family history unknown: Yes    Social History Social History  Substance Use Topics  . Smoking status: Never Smoker  . Smokeless tobacco: Never Used  . Alcohol use No     Allergies   Gabapentin and Aspirin   Review of Systems Review of Systems  Constitutional: Positive for chills.  Respiratory: Positive for cough.   All other systems reviewed and are negative.    Physical Exam Updated Vital Signs BP 167/98   Pulse (!) 122   Temp 99 F (37.2 C) (Oral)   Resp 16   Ht 5' (1.524 m)   Wt 145 lb (65.8 kg)   SpO2 95%   BMI 28.32 kg/m   Physical Exam  Constitutional: She is oriented to person, place, and time. She appears well-developed and well-nourished. No distress.  HENT:  Head: Normocephalic.  Nose: Nose normal.  Eyes: Conjunctivae are normal.  Neck: Neck supple. No tracheal deviation present.  Cardiovascular: Normal rate, regular rhythm and normal heart sounds.   Pulmonary/Chest: Effort normal. No respiratory distress. She has no wheezes. She has no rales.  Abdominal: Soft. She exhibits no distension. There is no tenderness. There is no rebound and no guarding.  Neurological: She is alert and oriented to person, place, and time.  Skin: Skin is warm and dry.  Psychiatric: She has a normal mood and affect.  Vitals reviewed.    ED Treatments / Results  Labs (all labs ordered are  listed, but only abnormal results are displayed) Labs Reviewed  COMPREHENSIVE METABOLIC PANEL - Abnormal; Notable for the following:       Result Value   Potassium 3.0 (*)    All other components within normal limits  CBC WITH DIFFERENTIAL/PLATELET - Abnormal; Notable for the following:    WBC 13.2 (*)    Platelets 137 (*)    Neutro Abs 9.4 (*)    Monocytes Absolute 1.1 (*)    All other components within normal limits  URINALYSIS, ROUTINE W REFLEX MICROSCOPIC  I-STAT CG4 LACTIC ACID, ED    EKG  EKG Interpretation None       Radiology Dg Chest 2 View  Result Date: 12/10/2016 CLINICAL DATA:  Cough and severe shortness of breath for 2 weeks EXAM: CHEST  2 VIEW COMPARISON:  09/26/2016 FINDINGS: Chronic cardiomegaly and aortic tortuosity. Atelectasis seen seen previously is improved. Chronic upper mediastinal widening from goiter and tortuous vessels as seen on 2017 chest CT. Spondylosis and exaggerated kyphosis. IMPRESSION: 1. Mild atelectasis. 2. Cardiomegaly without failure. Electronically Signed   By: Monte Fantasia M.D.   On: 12/10/2016 17:19    Procedures Procedures (including critical care time)  Medications Ordered in ED Medications  albuterol (PROVENTIL) (2.5 MG/3ML) 0.083% nebulizer solution 5 mg (5 mg Nebulization Given 12/10/16 1632)  acetaminophen (TYLENOL) solution 1,000 mg (1,000 mg Oral Given 12/10/16 1812)  cefTRIAXone (ROCEPHIN) 1 g in dextrose 5 % 50 mL IVPB (0 g Intravenous Stopped 12/10/16 2030)  sodium chloride 0.9 % bolus 1,000 mL (0 mLs Intravenous Stopped 12/10/16 2126)  dextromethorphan (DELSYM) 30 MG/5ML liquid 30 mg (30 mg Oral Given 12/10/16 2041)     Initial Impression / Assessment and Plan / ED Course  I have reviewed the triage vital signs and the nursing notes.  Pertinent labs & imaging results that were available during my care of the patient were reviewed by me and considered in my medical decision making (see chart for details).     71 y.o.  female presents with generalized illness with ongoing cough. She has heavy pyuria without urinary symptoms. Will cover for CAP and atypical pneumonia as well as UTI with mild WBC elevation with IV rocephin here then keflex and azithro PO at home. Tachycardia resolved with fluids. Not febrile, negative lactate, no other signs of developing sepsis but will culture for f/u. Medications provided in liquid form as Pt has difficulty swallowing at baseline. Plan to follow up with PCP as needed and return precautions discussed for worsening or new concerning symptoms.   Final Clinical Impressions(s) / ED Diagnoses   Final diagnoses:  Flu-like symptoms  Bacteriuria with pyuria    New Prescriptions Discharge Medication List as of 12/10/2016  8:50 PM    START taking these medications   Details  azithromycin (ZITHROMAX) 200 MG/5ML suspension Take 5 mLs (200 mg total) by mouth  daily., Starting Mon 12/10/2016, Until Sat 12/15/2016, Print    cephALEXin (KEFLEX) 250 MG/5ML suspension Take 10 mLs (500 mg total) by mouth 3 (three) times daily., Starting Mon 12/10/2016, Until Thu 12/20/2016, Print         Leo Grosser, MD 12/11/16 [phone]

## 2016-12-10 NOTE — ED Triage Notes (Addendum)
PT RECEIVED FROM HOME VIA EMS C/O COUGH, HEADACHE, SOB, FEVER, AND CHILLS X2 WEEKS. PT DENIES N/V/D.

## 2016-12-12 LAB — URINE CULTURE

## 2016-12-13 ENCOUNTER — Other Ambulatory Visit: Payer: Self-pay | Admitting: Family Medicine

## 2016-12-15 LAB — CULTURE, BLOOD (ROUTINE X 2)
CULTURE: NO GROWTH
CULTURE: NO GROWTH

## 2016-12-17 ENCOUNTER — Telehealth: Payer: Self-pay | Admitting: Family Medicine

## 2016-12-17 NOTE — Telephone Encounter (Signed)
Please resend diphenoxylate-atropine (LOMOTIL) 2.5-0.025 MG tablet to [facility]. Pharmacist informed patient that prescription is outdated and therefore cannot refill it.   Thank you.

## 2016-12-17 NOTE — Telephone Encounter (Signed)
This is a controlled substance - will forward to Dr. Jarold Song.

## 2016-12-18 MED ORDER — DIPHENOXYLATE-ATROPINE 2.5-0.025 MG PO TABS: ORAL_TABLET | ORAL | 1 refills | Status: DC

## 2016-12-18 NOTE — Telephone Encounter (Signed)
Done

## 2016-12-31 ENCOUNTER — Encounter: Payer: Self-pay | Admitting: Family Medicine

## 2016-12-31 ENCOUNTER — Ambulatory Visit: Payer: Medicare Other | Attending: Family Medicine | Admitting: Family Medicine

## 2016-12-31 VITALS — BP 178/102 | HR 78 | Temp 99.3°F | Ht 60.0 in | Wt 158.0 lb

## 2016-12-31 DIAGNOSIS — Z9049 Acquired absence of other specified parts of digestive tract: Secondary | ICD-10-CM | POA: Diagnosis not present

## 2016-12-31 DIAGNOSIS — J4520 Mild intermittent asthma, uncomplicated: Secondary | ICD-10-CM | POA: Diagnosis not present

## 2016-12-31 DIAGNOSIS — I10 Essential (primary) hypertension: Secondary | ICD-10-CM | POA: Diagnosis not present

## 2016-12-31 DIAGNOSIS — C9001 Multiple myeloma in remission: Secondary | ICD-10-CM | POA: Diagnosis not present

## 2016-12-31 DIAGNOSIS — Z888 Allergy status to other drugs, medicaments and biological substances status: Secondary | ICD-10-CM | POA: Insufficient documentation

## 2016-12-31 DIAGNOSIS — Z859 Personal history of malignant neoplasm, unspecified: Secondary | ICD-10-CM | POA: Insufficient documentation

## 2016-12-31 DIAGNOSIS — R51 Headache: Secondary | ICD-10-CM | POA: Insufficient documentation

## 2016-12-31 DIAGNOSIS — G8929 Other chronic pain: Secondary | ICD-10-CM | POA: Insufficient documentation

## 2016-12-31 DIAGNOSIS — Z9851 Tubal ligation status: Secondary | ICD-10-CM | POA: Insufficient documentation

## 2016-12-31 DIAGNOSIS — Z886 Allergy status to analgesic agent status: Secondary | ICD-10-CM | POA: Diagnosis not present

## 2016-12-31 DIAGNOSIS — E876 Hypokalemia: Secondary | ICD-10-CM | POA: Insufficient documentation

## 2016-12-31 DIAGNOSIS — Z9119 Patient's noncompliance with other medical treatment and regimen: Secondary | ICD-10-CM | POA: Diagnosis not present

## 2016-12-31 DIAGNOSIS — M5442 Lumbago with sciatica, left side: Secondary | ICD-10-CM | POA: Insufficient documentation

## 2016-12-31 DIAGNOSIS — M1990 Unspecified osteoarthritis, unspecified site: Secondary | ICD-10-CM | POA: Diagnosis not present

## 2016-12-31 LAB — COMPLETE METABOLIC PANEL WITH GFR
ALBUMIN: 4.1 g/dL (ref 3.6–5.1)
ALK PHOS: 83 U/L (ref 33–130)
ALT: 10 U/L (ref 6–29)
AST: 17 U/L (ref 10–35)
BUN: 10 mg/dL (ref 7–25)
CO2: 25 mmol/L (ref 20–31)
Calcium: 9.2 mg/dL (ref 8.6–10.4)
Chloride: 105 mmol/L (ref 98–110)
Creat: 0.71 mg/dL (ref 0.60–0.93)
GFR, Est African American: >89 mL/min (ref >=60)
GFR, Est Non African American: 85 mL/min (ref >=60)
Glucose, Bld: 88 mg/dL (ref 65–99)
POTASSIUM: 3.3 mmol/L — AB (ref 3.5–5.3)
SODIUM: 144 mmol/L (ref 135–146)
TOTAL PROTEIN: 7.6 g/dL (ref 6.1–8.1)
Total Bilirubin: 1.2 mg/dL (ref 0.2–1.2)

## 2016-12-31 MED ORDER — TRAMADOL HCL 50 MG PO TABS: 50.0000 mg | ORAL_TABLET | Freq: Two times a day (BID) | ORAL | 0 refills | Status: DC | PRN

## 2016-12-31 MED ORDER — LOSARTAN POTASSIUM 50 MG PO TABS: 50.0000 mg | ORAL_TABLET | Freq: Every day | ORAL | 3 refills | Status: DC

## 2016-12-31 MED ORDER — ALBUTEROL SULFATE HFA 108 (90 BASE) MCG/ACT IN AERS: 2.0000 | INHALATION_SPRAY | RESPIRATORY_TRACT | 3 refills | Status: DC | PRN

## 2016-12-31 NOTE — Patient Instructions (Signed)

## 2016-12-31 NOTE — Progress Notes (Signed)
Subjective:  Patient ID: Ruth Sanchez, female    DOB: Mar 28, 1946  Age: 71 y.o. MRN: [id number]  CC: Hypertension; Hospitalization Follow-up; Multiple Myeloma; Headache; and Cough   HPI Ruth Sanchez  is a 71 year old female with a history of Multiple Myeloma (in remission managed by oncology and currently on Zometa injections every 3 months), chronic low back pain, Hypertension, intermittent Asthma here for a follow up visit.  She was seen last month at the ED for a UTI and community-acquired pneumonia after she had presented with flulike symptoms. Currently taking Keflex and she denies urinary symptoms, shortness of breath, chest pains or wheezing. She uses albuterol inhaler as needed for asthma.  She is requesting tramadol for her chronic low back pain. I had referred her to the pain clinic and I am unable to ascertain what went on at her appointment but it seems she does not follow up with them anymore. She is requesting a refill of tramadol which she previously refused due to poor control of her back pain in the past.   Past Medical History:  Diagnosis Date  . Arthritis   . Asthma   . Cancer ([place])   . Hypertension     Past Surgical History:  Procedure Laterality Date  . APPENDECTOMY    . CHOLECYSTECTOMY    . ESOPHAGOGASTRODUODENOSCOPY N/A 11/24/2015   Procedure: ESOPHAGOGASTRODUODENOSCOPY (EGD);  Surgeon: Clarene Essex, MD;  Location: [facility];  Service: Endoscopy;  Laterality: N/A;  . TUBAL LIGATION      Allergies  Allergen Reactions  . Gabapentin Swelling and Other (See Comments)    Reaction:  Lip swelling  . Aspirin Itching and Rash     Outpatient Medications Prior to Visit  Medication Sig Dispense Refill  . acetaminophen (TYLENOL) 500 MG tablet Take 500-1,000 mg by mouth every 6 (six) hours as needed for mild pain, moderate pain or headache.    . diphenoxylate-atropine (LOMOTIL) 2.5-0.025 MG tablet TAKE 1 TABLET BY MOUTH 2 TIMES DAILY AS NEEDED FOR DIARRHEA  OR loose stools 30 tablet 1  . metoprolol tartrate (LOPRESSOR) 25 MG tablet Take 1 tablet (25 mg total) by mouth 2 (two) times daily. 180 tablet 3  . potassium chloride (K-DUR) 10 MEQ tablet Take 1 tablet (10 mEq total) by mouth daily. 5 tablet 0  . albuterol (PROVENTIL HFA;VENTOLIN HFA) 108 (90 Base) MCG/ACT inhaler Inhale 2 puffs into the lungs every 4 (four) hours as needed for wheezing or shortness of breath. 1 Inhaler 3  . losartan (COZAAR) 25 MG tablet Take 1 tablet (25 mg total) by mouth daily. 30 tablet 0   No facility-administered medications prior to visit.     ROS Review of Systems Constitutional: Negative for activity change, appetite change and fatigue.  HENT: Negative for congestion, sinus pressure and sore throat.   Eyes: Negative for visual disturbance.  Respiratory: Negative for cough, chest tightness, shortness of breath and wheezing.   Cardiovascular: Negative for chest pain and palpitations.  Gastrointestinal: Negative for abdominal pain, constipation and abdominal distention.  Endocrine: Negative for polydipsia.  Genitourinary: Negative for dysuria and frequency.  Musculoskeletal: Positive for back pain. Negative for arthralgias.  Skin: Negative for rash.  Neurological: Negative for tremors, light-headedness and numbness.  Hematological: Does not bruise/bleed easily.  Psychiatric/Behavioral: Negative for behavioral problems and agitation.  Objective:  BP (!) 178/102 (BP Location: Left Arm, Cuff Size: Large)   Pulse 78   Temp 99.3 F (37.4 C) (Oral)   Ht 5' (1.524  m)   Wt 158 lb (71.7 kg)   BMI 30.86 kg/m   BP/Weight 12/31/2016 12/10/2016 11/30/2016  Systolic BP 178 168 150  Diastolic BP 102 105 101  Wt. (Lbs) 158 145 146.9  BMI 30.86 28.32 24.45      Physical Exam Constitutional: She is oriented to person, place, and time. She appears well-developed and well-nourished.  Cardiovascular: Normal heart sounds and intact distal pulses.  .   No murmur  heard. Pulmonary/Chest: Effort normal and breath sounds normal. She has no wheezes. She has no rales. She exhibits no tenderness.  Abdominal: Soft. Bowel sounds are normal. She exhibits no distension and no mass. There is no tenderness.  Musculoskeletal: She exhibits tenderness (tendernes to palpation of lumbar spine).  Neurological: She is alert and oriented to person, place, and time.  Psych: normal  Assessment & Plan:   1. Essential hypertension Uncontrolled; she has been taking only metoprolol and not taking losartan Increase dose of losartan - losartan (COZAAR) 50 MG tablet; Take 1 tablet (50 mg total) by mouth daily.  Dispense: 30 tablet; Refill: 3 - COMPLETE METABOLIC PANEL WITH GFR  2. Mild intermittent chronic asthma without complication Controlled her MDI as needed - albuterol (PROVENTIL HFA;VENTOLIN HFA) 108 (90 Base) MCG/ACT inhaler; Inhale 2 puffs into the lungs every 4 (four) hours as needed for wheezing or shortness of breath.  Dispense: 1 Inhaler; Refill: 3  3. Hypokalemia Continue potassium replacement Hopefully addition of losartan will curb for hypokalemia  4. Chronic midline low back pain with left-sided sciatica Referred to the pain clinic in the past the patient has been noncompliant Chronic pain due to history of multiple myeloma - traMADol (ULTRAM) 50 MG tablet; Take 1 tablet (50 mg total) by mouth every 12 (twelve) hours as needed.  Dispense: 30 tablet; Refill: 0   Meds ordered this encounter  Medications  . losartan (COZAAR) 50 MG tablet    Sig: Take 1 tablet (50 mg total) by mouth daily.    Dispense:  30 tablet    Refill:  3  . traMADol (ULTRAM) 50 MG tablet    Sig: Take 1 tablet (50 mg total) by mouth every 12 (twelve) hours as needed.    Dispense:  30 tablet    Refill:  0    Decrease previous dose  . albuterol (PROVENTIL HFA;VENTOLIN HFA) 108 (90 Base) MCG/ACT inhaler    Sig: Inhale 2 puffs into the lungs every 4 (four) hours as needed for  wheezing or shortness of breath.    Dispense:  1 Inhaler    Refill:  3    Follow-up: Return in about 2 weeks (around 01/14/2017) for follow up on Hypertension.    Amao MD   

## 2016-12-31 NOTE — Progress Notes (Signed)
Would like cough meds, inhaler and tramadol

## 2017-01-01 ENCOUNTER — Telehealth: Payer: Self-pay

## 2017-01-01 ENCOUNTER — Other Ambulatory Visit: Payer: Self-pay | Admitting: Family Medicine

## 2017-01-01 MED ORDER — POTASSIUM CHLORIDE ER 10 MEQ PO TBCR: 10.0000 meq | EXTENDED_RELEASE_TABLET | Freq: Every day | ORAL | 1 refills | Status: DC

## 2017-01-01 NOTE — Telephone Encounter (Signed)
-----   Message from Arnoldo Morale, MD sent at 01/01/2017 12:03 PM EDT ----- Potassium is slightly low. Would advise her to continue her potassium pills and new medication (losartan) which I placed her on for her blood pressure which she should take in addition to her metoprolol

## 2017-01-01 NOTE — Telephone Encounter (Signed)
Writer called patient regarding her potassium and let her know that MD sent it to her [facility].

## 2017-01-01 NOTE — Telephone Encounter (Signed)
Writer spoke with patient regarding her lab results. Patient stated understanding.

## 2017-01-14 ENCOUNTER — Encounter: Payer: Self-pay | Admitting: Family Medicine

## 2017-01-14 ENCOUNTER — Ambulatory Visit: Payer: Medicare Other | Attending: Family Medicine | Admitting: Family Medicine

## 2017-01-14 VITALS — BP 163/106 | HR 73 | Resp 16 | Wt 158.2 lb

## 2017-01-14 DIAGNOSIS — G8929 Other chronic pain: Secondary | ICD-10-CM

## 2017-01-14 DIAGNOSIS — J4520 Mild intermittent asthma, uncomplicated: Secondary | ICD-10-CM | POA: Diagnosis not present

## 2017-01-14 DIAGNOSIS — Z79899 Other long term (current) drug therapy: Secondary | ICD-10-CM | POA: Insufficient documentation

## 2017-01-14 DIAGNOSIS — C9001 Multiple myeloma in remission: Secondary | ICD-10-CM | POA: Diagnosis not present

## 2017-01-14 DIAGNOSIS — I10 Essential (primary) hypertension: Secondary | ICD-10-CM | POA: Diagnosis not present

## 2017-01-14 DIAGNOSIS — M5442 Lumbago with sciatica, left side: Secondary | ICD-10-CM | POA: Diagnosis not present

## 2017-01-14 MED ORDER — TRAMADOL HCL 50 MG PO TABS: 50.0000 mg | ORAL_TABLET | Freq: Two times a day (BID) | ORAL | 1 refills | Status: DC | PRN

## 2017-01-14 MED ORDER — LOSARTAN POTASSIUM 100 MG PO TABS: 100.0000 mg | ORAL_TABLET | Freq: Every day | ORAL | 3 refills | Status: DC

## 2017-01-14 NOTE — Progress Notes (Signed)
Subjective:  Patient ID: Ruth Sanchez, female    DOB: Nov 16, 1945  Age: 71 y.o. MRN: [id number]  CC: No chief complaint on file.   HPI Ruth Sanchez presents is a 71 year old female with a history of Multiple Myeloma (in remission managed by oncology and currently on Zometa injections every 3 months), chronic low back pain, Hypertension, intermittent Asthma here for a follow up of hypertension.  Endorses compliance with Metoprolol and Losartan 32m but her blood pressure is still elevated. She has also adhere to low-sodium diet.  Request refill of tramadol.  Past Medical History:  Diagnosis Date  . Arthritis   . Asthma   . Cancer ([place]   . Hypertension     Past Surgical History:  Procedure Laterality Date  . APPENDECTOMY    . CHOLECYSTECTOMY    . ESOPHAGOGASTRODUODENOSCOPY N/A 11/24/2015   Procedure: ESOPHAGOGASTRODUODENOSCOPY (EGD);  Surgeon: MClarene Essex MD;  [facility];  Service: Endoscopy;  Laterality: N/A;  . TUBAL LIGATION      Allergies  Allergen Reactions  . Gabapentin Swelling and Other (See Comments)    Reaction:  Lip swelling  . Aspirin Itching and Rash      Outpatient Medications Prior to Visit  Medication Sig Dispense Refill  . albuterol (PROVENTIL HFA;VENTOLIN HFA) 108 (90 Base) MCG/ACT inhaler Inhale 2 puffs into the lungs every 4 (four) hours as needed for wheezing or shortness of breath. 1 Inhaler 3  . metoprolol tartrate (LOPRESSOR) 25 MG tablet Take 1 tablet (25 mg total) by mouth 2 (two) times daily. 180 tablet 3  . potassium chloride (K-DUR) 10 MEQ tablet Take 1 tablet (10 mEq total) by mouth daily. 30 tablet 1  . losartan (COZAAR) 50 MG tablet Take 1 tablet (50 mg total) by mouth daily. 30 tablet 3  . traMADol (ULTRAM) 50 MG tablet Take 1 tablet (50 mg total) by mouth every 12 (twelve) hours as needed. 30 tablet 0  . acetaminophen (TYLENOL) 500 MG tablet Take 500-1,000 mg by mouth every 6 (six) hours as needed for mild pain, moderate  pain or headache.    . diphenoxylate-atropine (LOMOTIL) 2.5-0.025 MG tablet TAKE 1 TABLET BY MOUTH 2 TIMES DAILY AS NEEDED FOR DIARRHEA OR loose stools (Patient not taking: Reported on 01/14/2017) 30 tablet 1   No facility-administered medications prior to visit.     ROS Review of Systems Constitutional: Negative for activity change, appetite change and fatigue.  HENT: Negative for congestion, sinus pressure and sore throat.   Eyes: Negative for visual disturbance.  Respiratory: Negative for cough, chest tightness, shortness of breath and wheezing.   Cardiovascular: Negative for chest pain and palpitations.  Gastrointestinal: Negative for abdominal pain, constipation and abdominal distention.  Endocrine: Negative for polydipsia.  Genitourinary: Negative for dysuria and frequency.  Musculoskeletal: Positive for back pain. Negative for arthralgias.  Skin: Negative for rash.  Neurological: Negative for tremors, light-headedness and numbness.  Hematological: Does not bruise/bleed easily.  Psychiatric/Behavioral: Negative for behavioral problems and agitation.   Objective:  BP (!) 163/106 (BP Location: Left Arm, Patient Position: Sitting, Cuff Size: Normal)   Pulse 73   Resp 16   Wt 158 lb 3.2 oz (71.8 kg)   SpO2 99%   BMI 30.90 kg/m   BP/Weight 01/14/2017 12/31/2016 25/17/6160 Systolic BP 173711061269 Diastolic BP 148514621703 Wt. (Lbs) 158.2 158 145  BMI 30.9 30.86 28.32      Physical Exam Constitutional: She is oriented to  person, place, and time. She appears well-developed and well-nourished.  Cardiovascular: Normal heart sounds and intact distal pulses.  .   No murmur heard. Pulmonary/Chest: Effort normal and breath sounds normal. She has no wheezes. She has no rales. She exhibits no tenderness.  Abdominal: Soft. Bowel sounds are normal. She exhibits no distension and no mass. There is no tenderness.  Musculoskeletal: She exhibits tenderness (tendernes to palpation of  lumbar spine).  Neurological: She is alert and oriented to person, place, and time.  Psych: normal  Assessment & Plan:   1. Essential hypertension Uncontrolled Increased dose of losartan from 50 mg to 100 mg Continue metoprolol - losartan (COZAAR) 100 MG tablet; Take 1 tablet (100 mg total) by mouth daily.  Dispense: 30 tablet; Refill: 3  2. Chronic midline low back pain with left-sided sciatica Reviewed [place] controlled substances database Patient has been compliant - Drugs of abuse scrn w alc, routine urine - traMADol (ULTRAM) 50 MG tablet; Take 1 tablet (50 mg total) by mouth every 12 (twelve) hours as needed.  Dispense: 60 tablet; Refill: 1   Meds ordered this encounter  Medications  . losartan (COZAAR) 100 MG tablet    Sig: Take 1 tablet (100 mg total) by mouth daily.    Dispense:  30 tablet    Refill:  3    Discontinue previous dose  . traMADol (ULTRAM) 50 MG tablet    Sig: Take 1 tablet (50 mg total) by mouth every 12 (twelve) hours as needed.    Dispense:  60 tablet    Refill:  1    Follow-up: Return in about 1 month (around 02/14/2017) for Follow-up of hypertension.   Arnoldo Morale MD

## 2017-01-22 LAB — URINE DRUGS OF ABUSE SCREEN W ALC, ROUTINE (REF LAB)
Amphetamines, Urine: NEGATIVE ng/mL
BENZODIAZEPINE QUANT UR: NEGATIVE ng/mL
Barbiturate Quant, Ur: NEGATIVE ng/mL
Cannabinoid Quant, Ur: NEGATIVE ng/mL
Cocaine (Metab.): NEGATIVE ng/mL
Ethanol U, Quan: NEGATIVE %
METHADONE SCREEN, URINE: NEGATIVE ng/mL
PCP QUANT UR: NEGATIVE ng/mL
PROPOXYPHENE: NEGATIVE ng/mL

## 2017-01-22 LAB — OPIATES CONFIRMATION, URINE: OPIATES: NEGATIVE

## 2017-01-28 ENCOUNTER — Telehealth: Payer: Self-pay

## 2017-01-28 NOTE — Telephone Encounter (Signed)
Called to find out if pt would like to schedule colonoscopy  

## 2017-02-11 ENCOUNTER — Other Ambulatory Visit: Payer: Self-pay | Admitting: Family Medicine

## 2017-02-11 DIAGNOSIS — G8929 Other chronic pain: Secondary | ICD-10-CM

## 2017-02-11 DIAGNOSIS — M5442 Lumbago with sciatica, left side: Principal | ICD-10-CM

## 2017-02-13 ENCOUNTER — Other Ambulatory Visit: Payer: Medicare Other

## 2017-02-13 ENCOUNTER — Ambulatory Visit: Payer: Medicare Other

## 2017-02-14 ENCOUNTER — Ambulatory Visit: Payer: Medicare Other | Admitting: Family Medicine

## 2017-02-20 ENCOUNTER — Other Ambulatory Visit: Payer: Self-pay | Admitting: Medical Oncology

## 2017-02-20 DIAGNOSIS — C9001 Multiple myeloma in remission: Secondary | ICD-10-CM

## 2017-02-21 ENCOUNTER — Ambulatory Visit (HOSPITAL_BASED_OUTPATIENT_CLINIC_OR_DEPARTMENT_OTHER): Payer: Medicare Other

## 2017-02-21 ENCOUNTER — Other Ambulatory Visit (HOSPITAL_BASED_OUTPATIENT_CLINIC_OR_DEPARTMENT_OTHER): Payer: Medicare Other

## 2017-02-21 ENCOUNTER — Other Ambulatory Visit: Payer: Self-pay | Admitting: Medical Oncology

## 2017-02-21 VITALS — BP 157/97 | HR 76 | Temp 98.7°F | Resp 20

## 2017-02-21 DIAGNOSIS — C9001 Multiple myeloma in remission: Secondary | ICD-10-CM

## 2017-02-21 LAB — CBC WITH DIFFERENTIAL/PLATELET
BASO%: 0.1 % (ref 0.0–2.0)
Basophils Absolute: 0 10*3/uL (ref 0.0–0.1)
EOS ABS: 0.1 10*3/uL (ref 0.0–0.5)
EOS%: 0.6 % (ref 0.0–7.0)
HEMATOCRIT: 42.4 % (ref 34.8–46.6)
HEMOGLOBIN: 14.2 g/dL (ref 11.6–15.9)
LYMPH%: 25.7 % (ref 14.0–49.7)
MCH: 29.9 pg (ref 25.1–34.0)
MCHC: 33.5 g/dL (ref 31.5–36.0)
MCV: 89.3 fL (ref 79.5–101.0)
MONO#: 0.5 10*3/uL (ref 0.1–0.9)
MONO%: 5.7 % (ref 0.0–14.0)
NEUT%: 67.9 % (ref 38.4–76.8)
NEUTROS ABS: 5.4 10*3/uL (ref 1.5–6.5)
NRBC: 0 % (ref 0–0)
PLATELETS: 156 10*3/uL (ref 145–400)
RBC: 4.75 10*6/uL (ref 3.70–5.45)
RDW: 13.9 % (ref 11.2–14.5)
WBC: 8 10*3/uL (ref 3.9–10.3)
lymph#: 2.1 10*3/uL (ref 0.9–3.3)

## 2017-02-21 LAB — COMPREHENSIVE METABOLIC PANEL
ALT: 13 U/L (ref 0–55)
ANION GAP: 11 meq/L (ref 3–11)
AST: 20 U/L (ref 5–34)
Albumin: 3.8 g/dL (ref 3.5–5.0)
Alkaline Phosphatase: 95 U/L (ref 40–150)
BILIRUBIN TOTAL: 1.14 mg/dL (ref 0.20–1.20)
BUN: 6.4 mg/dL — ABNORMAL LOW (ref 7.0–26.0)
CO2: 28 meq/L (ref 22–29)
CREATININE: 0.7 mg/dL (ref 0.6–1.1)
Calcium: 9.6 mg/dL (ref 8.4–10.4)
Chloride: 105 mEq/L (ref 98–109)
EGFR: >90 mL/min/{1.73_m2} (ref >90)
GLUCOSE: 101 mg/dL (ref 70–140)
Potassium: 2.9 mEq/L — CL (ref 3.5–5.1)
Sodium: 145 mEq/L (ref 136–145)
TOTAL PROTEIN: 8 g/dL (ref 6.4–8.3)

## 2017-02-21 MED ORDER — ACETAMINOPHEN 325 MG PO TABS
ORAL_TABLET | ORAL | Status: AC
  Filled 2017-02-21: qty 2

## 2017-02-21 MED ORDER — SODIUM CHLORIDE 0.9 % IV SOLN
Freq: Once | INTRAVENOUS | Status: AC
  Administered 2017-02-21: 09:00:00 via INTRAVENOUS

## 2017-02-21 MED ORDER — ACETAMINOPHEN 325 MG PO TABS
650.0000 mg | ORAL_TABLET | Freq: Once | ORAL | Status: AC
  Administered 2017-02-21: 650 mg via ORAL

## 2017-02-21 MED ORDER — ZOLEDRONIC ACID 4 MG/100ML IV SOLN
4.0000 mg | Freq: Once | INTRAVENOUS | Status: AC
  Administered 2017-02-21: 4 mg via INTRAVENOUS
  Filled 2017-02-21: qty 100

## 2017-02-21 NOTE — Progress Notes (Signed)
Notified Dr Julien Nordmann of K 2.9, Pt stated she is not taking home Rx for K because she can not swallow the tablet, instructed pt to resume taking it, dissolving it in applesauce would help. Pt verbalized understanding and agreed to try it.No new orders. Will continue to monitor.

## 2017-02-28 ENCOUNTER — Telehealth: Payer: Self-pay | Admitting: Medical Oncology

## 2017-02-28 ENCOUNTER — Other Ambulatory Visit: Payer: Self-pay | Admitting: Medical Oncology

## 2017-02-28 DIAGNOSIS — E876 Hypokalemia: Secondary | ICD-10-CM

## 2017-02-28 MED ORDER — POTASSIUM CHLORIDE CRYS ER 20 MEQ PO TBCR: 20.0000 meq | EXTENDED_RELEASE_TABLET | Freq: Every day | ORAL | 0 refills | Status: DC

## 2017-02-28 NOTE — Telephone Encounter (Signed)
Faxed cmet and cbc to Dr Jarold Song.Pt notified to pick up kdur rx at local pharmacy-she request liquid.Called  pharmacy and requested potassium liquid instead of tablets.

## 2017-03-02 ENCOUNTER — Other Ambulatory Visit: Payer: Self-pay | Admitting: Family Medicine

## 2017-03-29 ENCOUNTER — Ambulatory Visit: Payer: Medicare Other | Attending: Family Medicine | Admitting: Family Medicine

## 2017-03-29 ENCOUNTER — Encounter: Payer: Self-pay | Admitting: Family Medicine

## 2017-03-29 VITALS — BP 166/93 | HR 91 | Temp 98.8°F | Resp 16 | Ht 62.0 in | Wt 157.0 lb

## 2017-03-29 DIAGNOSIS — C9001 Multiple myeloma in remission: Secondary | ICD-10-CM | POA: Insufficient documentation

## 2017-03-29 DIAGNOSIS — J4520 Mild intermittent asthma, uncomplicated: Secondary | ICD-10-CM | POA: Insufficient documentation

## 2017-03-29 DIAGNOSIS — G8929 Other chronic pain: Secondary | ICD-10-CM | POA: Diagnosis not present

## 2017-03-29 DIAGNOSIS — Z9889 Other specified postprocedural states: Secondary | ICD-10-CM | POA: Insufficient documentation

## 2017-03-29 DIAGNOSIS — I10 Essential (primary) hypertension: Secondary | ICD-10-CM | POA: Diagnosis not present

## 2017-03-29 DIAGNOSIS — R197 Diarrhea, unspecified: Secondary | ICD-10-CM | POA: Diagnosis not present

## 2017-03-29 DIAGNOSIS — Z886 Allergy status to analgesic agent status: Secondary | ICD-10-CM | POA: Insufficient documentation

## 2017-03-29 DIAGNOSIS — K529 Noninfective gastroenteritis and colitis, unspecified: Secondary | ICD-10-CM

## 2017-03-29 DIAGNOSIS — M5442 Lumbago with sciatica, left side: Secondary | ICD-10-CM

## 2017-03-29 DIAGNOSIS — E876 Hypokalemia: Secondary | ICD-10-CM

## 2017-03-29 DIAGNOSIS — Z9049 Acquired absence of other specified parts of digestive tract: Secondary | ICD-10-CM | POA: Insufficient documentation

## 2017-03-29 DIAGNOSIS — Z888 Allergy status to other drugs, medicaments and biological substances status: Secondary | ICD-10-CM | POA: Insufficient documentation

## 2017-03-29 DIAGNOSIS — Z9851 Tubal ligation status: Secondary | ICD-10-CM | POA: Insufficient documentation

## 2017-03-29 MED ORDER — TRAMADOL HCL 50 MG PO TABS: 50.0000 mg | ORAL_TABLET | Freq: Two times a day (BID) | ORAL | 2 refills | Status: DC | PRN

## 2017-03-29 MED ORDER — POTASSIUM CHLORIDE 20 MEQ PO PACK: 20.0000 meq | PACK | Freq: Every day | ORAL | 2 refills | Status: DC

## 2017-03-29 MED ORDER — METOPROLOL TARTRATE 50 MG PO TABS: 50.0000 mg | ORAL_TABLET | Freq: Two times a day (BID) | ORAL | 3 refills | Status: DC

## 2017-03-29 MED ORDER — DIPHENOXYLATE-ATROPINE 2.5-0.025 MG PO TABS
ORAL_TABLET | ORAL | 1 refills | Status: DC
Start: 2017-03-29 — End: 2018-07-08

## 2017-03-29 NOTE — Patient Instructions (Signed)

## 2017-03-29 NOTE — Progress Notes (Signed)
C/C diarrhea x 2 weeks Pt stated possible reaction to tylenol  No tobacco user  Pain scale #10

## 2017-03-29 NOTE — Progress Notes (Signed)
Subjective:  Patient ID: Ruth Sanchez, female    DOB: April 07, 1946  Age: 71 y.o. MRN: [id number]  CC: Diarrhea   HPI Ruth Sanchez is a 71 year old female with a history of Multiple Myeloma (in remission managed by oncology and currently on Zometa injections every 3 months), chronic low back pain, Hypertension, intermittent Asthma here for a follow up of hypertension.  Endorses compliance with Metoprolol and Losartan 146m but her blood pressure is still elevated and she only has her losartan bottle with her but does not have metoprolol.. She also adheres to low-sodium diet.  She complains of chronic diarrhea which she has had for the last 1 week and attributes this to intake of Tylenol OTC pills. She has run out of her Lomotil which she previously took for diarrhea   Review of her labs indicated hypokalemia of 2.9 for which potassium replacement was sent to her pharmacy however she informs me she wasn't able to swallow potassium pills and this was changed to Liquid by oncology however the co-pay was $64 which she was unable to afford.  Request refill of tramadol.  Past Medical History:  Diagnosis Date  . Arthritis   . Asthma   . Cancer ([place]   . Hypertension     Past Surgical History:  Procedure Laterality Date  . APPENDECTOMY    . CHOLECYSTECTOMY    . ESOPHAGOGASTRODUODENOSCOPY N/A 11/24/2015   Procedure: ESOPHAGOGASTRODUODENOSCOPY (EGD);  Surgeon: MClarene Essex MD;  [facility];  Service: Endoscopy;  Laterality: N/A;  . TUBAL LIGATION      Allergies  Allergen Reactions  . Gabapentin Swelling and Other (See Comments)    Reaction:  Lip swelling  . Aspirin Itching and Rash     Outpatient Medications Prior to Visit  Medication Sig Dispense Refill  . acetaminophen (TYLENOL) 500 MG tablet Take 500-1,000 mg by mouth every 6 (six) hours as needed for mild pain, moderate pain or headache.    . albuterol (PROVENTIL HFA;VENTOLIN HFA) 108 (90 Base) MCG/ACT inhaler  Inhale 2 puffs into the lungs every 4 (four) hours as needed for wheezing or shortness of breath. 1 Inhaler 3  . losartan (COZAAR) 100 MG tablet Take 1 tablet (100 mg total) by mouth daily. 30 tablet 3  . diphenoxylate-atropine (LOMOTIL) 2.5-0.025 MG tablet TAKE 1 TABLET BY MOUTH 2 TIMES DAILY AS NEEDED FOR DIARRHEA OR loose stools (Patient not taking: Reported on 01/14/2017) 30 tablet 1  . metoprolol tartrate (LOPRESSOR) 25 MG tablet Take 1 tablet (25 mg total) by mouth 2 (two) times daily. 180 tablet 3  . potassium chloride (K-DUR) 10 MEQ tablet Take 1 tablet (10 mEq total) by mouth daily. 30 tablet 1  . potassium chloride SA (K-DUR,KLOR-CON) 20 MEQ tablet Take 1 tablet (20 mEq total) by mouth daily. 10 tablet 0  . traMADol (ULTRAM) 50 MG tablet Take 1 tablet (50 mg total) by mouth every 12 (twelve) hours as needed. 60 tablet 1   No facility-administered medications prior to visit.     ROS Review of Systems Constitutional: Negative for activity change, appetite change and fatigue.  HENT: Negative for congestion, sinus pressure and sore throat.   Eyes: Negative for visual disturbance.  Respiratory: Negative for cough, chest tightness, shortness of breath and wheezing.   Cardiovascular: Negative for chest pain and palpitations.  Gastrointestinal: Negative for abdominal pain, constipation and abdominal distention.  positive for diarrhea Endocrine: Negative for polydipsia.  Genitourinary: Negative for dysuria and frequency.  Musculoskeletal: Positive  for back pain. Negative for arthralgias.  Skin: Negative for rash.  Neurological: Negative for tremors, light-headedness and numbness.  Hematological: Does not bruise/bleed easily.  Psychiatric/Behavioral: Negative for behavioral problems and agitation.  Objective:  BP (!) 166/93 (BP Location: Right Arm, Patient Position: Sitting, Cuff Size: Normal)   Pulse 91   Temp 98.8 F (37.1 C) (Oral)   Resp 16   Ht '5\' 2"'  (1.575 m)   Wt 157 lb (71.2  kg)   SpO2 99%   BMI 28.72 kg/m   BP/Weight 03/29/2017 02/21/2017 05/22/6552  Systolic BP 748 270 786  Diastolic BP 93 97 754  Wt. (Lbs) 157 - 158.2  BMI 28.72 - 30.9      Physical Exam Constitutional: She is oriented to person, place, and time. She appears well-developed and well-nourished.  Neck: no JVD Cardiovascular: Normal heart sounds and intact distal pulses.  .   No murmur heard. Pulmonary/Chest: Effort normal and breath sounds normal. She has no wheezes. She has no rales. She exhibits no tenderness.  Abdominal: Soft. Bowel sounds are normal. She exhibits no distension and no mass. There is no tenderness.  Musculoskeletal: She exhibits tenderness (tendernes to palpation of lumbar spine).  Neurological: She is alert and oriented to person, place, and time.  Psych: normal  Assessment & Plan:   1. Essential hypertension Uncontrolled Unsure if she is taking both Losartan and metoprolol as she has only losartan with her Advised to bring all her medications at her next visit Increased dose of metoprolol Low-sodium diet - metoprolol tartrate (LOPRESSOR) 50 MG tablet; Take 1 tablet (50 mg total) by mouth 2 (two) times daily. (Patient not taking: Reported on 03/29/2017)  Dispense: 60 tablet; Refill: 3  2. Chronic midline low back pain with left-sided sciatica Improved on Tramadol Use back brace - traMADol (ULTRAM) 50 MG tablet; Take 1 tablet (50 mg total) by mouth every 12 (twelve) hours as needed.  Dispense: 60 tablet; Refill: 2  3. Hypokalemia Last potassium was 2.9 She has not been taking replacements and we have discussed implications of this including hospitalization and cardiac arrhythmias Strongly encouraged her to do pickup liquid potassium from pharmacy We have discussed the implication of not - potassium chloride (KLOR-CON) 20 MEQ packet; Take 20 mEq by mouth daily. (Patient not taking: Reported on 03/29/2017)  Dispense: 30 packet; Refill: 2 - Basic Metabolic Panel;  Future  4. Chronic diarrhea Unknown etiology - diphenoxylate-atropine (LOMOTIL) 2.5-0.025 MG tablet; TAKE 1 TABLET BY MOUTH 3 TIMES DAILY AS NEEDED FOR DIARRHEA OR loose stools  Dispense: 40 tablet; Refill: 1 - Basic Metabolic Panel; Future  5. Multiple Myeloma\ In remission Continue Zometa at the cancer center  Meds ordered this encounter  Medications  . metoprolol tartrate (LOPRESSOR) 50 MG tablet    Sig: Take 1 tablet (50 mg total) by mouth 2 (two) times daily.    Dispense:  60 tablet    Refill:  3    Discontinue previous dose  . diphenoxylate-atropine (LOMOTIL) 2.5-0.025 MG tablet    Sig: TAKE 1 TABLET BY MOUTH 3 TIMES DAILY AS NEEDED FOR DIARRHEA OR loose stools    Dispense:  40 tablet    Refill:  1  . traMADol (ULTRAM) 50 MG tablet    Sig: Take 1 tablet (50 mg total) by mouth every 12 (twelve) hours as needed.    Dispense:  60 tablet    Refill:  2  . potassium chloride (KLOR-CON) 20 MEQ packet    Sig: Take 20  mEq by mouth daily.    Dispense:  30 packet    Refill:  2    Follow-up: Return in about 3 months (around 06/29/2017) for Follow-up of chronic medical conditions.   Arnoldo Morale MD

## 2017-04-17 ENCOUNTER — Other Ambulatory Visit: Payer: Self-pay | Admitting: Family Medicine

## 2017-05-15 ENCOUNTER — Other Ambulatory Visit: Payer: Self-pay | Admitting: Medical Oncology

## 2017-05-15 ENCOUNTER — Telehealth: Payer: Self-pay | Admitting: Internal Medicine

## 2017-05-15 ENCOUNTER — Other Ambulatory Visit: Payer: Medicare Other

## 2017-05-15 ENCOUNTER — Ambulatory Visit: Payer: Medicare Other | Admitting: Internal Medicine

## 2017-05-15 NOTE — Telephone Encounter (Signed)
Ruth Sanchez called and said was upset. She said that she has server diarrhea and needed to cancel her appointment.  I asked her if she wanted to reschedule and she said I will if I live through this.  She was crying as she told me she had bone cancer and didn't know if she would be here much longer.  I offered to get her an RN but she refused.  I told her I would like Dr Julien Nordmann know that she called to cancel her appointment

## 2017-05-18 ENCOUNTER — Emergency Department (HOSPITAL_COMMUNITY)
Admission: EM | Admit: 2017-05-18 | Discharge: 2017-05-18 | Disposition: A | Discharge status: Home / Self Care | Payer: Medicare Other | Attending: Emergency Medicine | Admitting: Emergency Medicine

## 2017-05-18 ENCOUNTER — Encounter (HOSPITAL_COMMUNITY): Payer: Self-pay

## 2017-05-18 ENCOUNTER — Emergency Department (HOSPITAL_COMMUNITY): Payer: Medicare Other

## 2017-05-18 DIAGNOSIS — M545 Low back pain: Secondary | ICD-10-CM | POA: Insufficient documentation

## 2017-05-18 DIAGNOSIS — I10 Essential (primary) hypertension: Secondary | ICD-10-CM | POA: Insufficient documentation

## 2017-05-18 DIAGNOSIS — M47816 Spondylosis without myelopathy or radiculopathy, lumbar region: Secondary | ICD-10-CM | POA: Diagnosis not present

## 2017-05-18 DIAGNOSIS — Z8579 Personal history of other malignant neoplasms of lymphoid, hematopoietic and related tissues: Secondary | ICD-10-CM | POA: Diagnosis not present

## 2017-05-18 DIAGNOSIS — J45909 Unspecified asthma, uncomplicated: Secondary | ICD-10-CM | POA: Diagnosis not present

## 2017-05-18 DIAGNOSIS — G8929 Other chronic pain: Secondary | ICD-10-CM

## 2017-05-18 DIAGNOSIS — M4696 Unspecified inflammatory spondylopathy, lumbar region: Secondary | ICD-10-CM | POA: Diagnosis not present

## 2017-05-18 DIAGNOSIS — Z79899 Other long term (current) drug therapy: Secondary | ICD-10-CM | POA: Diagnosis not present

## 2017-05-18 DIAGNOSIS — M47896 Other spondylosis, lumbar region: Secondary | ICD-10-CM | POA: Diagnosis not present

## 2017-05-18 MED ORDER — OXYCODONE HCL 5 MG PO TABS
5.0000 mg | ORAL_TABLET | Freq: Once | ORAL | Status: AC
  Administered 2017-05-18: 5 mg via ORAL
  Filled 2017-05-18: qty 1

## 2017-05-18 MED ORDER — PREDNISONE 20 MG PO TABS: 20.0000 mg | ORAL_TABLET | Freq: Two times a day (BID) | ORAL | 0 refills | Status: DC

## 2017-05-18 MED ORDER — PREDNISONE 20 MG PO TABS
60.0000 mg | ORAL_TABLET | Freq: Once | ORAL | Status: AC
  Administered 2017-05-18: 60 mg via ORAL
  Filled 2017-05-18: qty 3

## 2017-05-18 NOTE — Discharge Instructions (Signed)
Use heat on the sore area of your back 3 or 4 times a day.  Continue to use Tylenol for pain, in addition to your tramadol. If you need something stronger, see your primary care provider for adjustment to your medicine regimen.

## 2017-05-18 NOTE — ED Provider Notes (Signed)
[facility] DEPT Provider Note   CSN: [id number] Arrival date & time: 05/18/17  4665     History   Chief Complaint Chief Complaint  Patient presents with  . bone cancer pain    HPI Ruth Sanchez is a 71 y.o. female.  She presents for evaluation of ongoing low back pain, for 2 months, intolerable despite taking tramadol, twice a day. She is prescribed tramadol chronically, written by her PCP, to take twice a day, last prescription written about 7 weeks ago, #60 with 2 refills. Patient states that she has used oxycodone in the past, with better pain control. She has previously been treated at a pain clinic, but stopped going there because her husband told them that she should not be taking chronic pain medications. Patient complains of back pain secondary to myeloma, which has been present for at least 13 years. She follows up with oncology, and had an infusion for multiple myeloma, about 3 months ago. Canceled her last oncology appointment, last week, because she was having diarrhea. She has chronic relapsing diarrhea, and takes Lomotil for it. She denies fever, cough, nausea, vomiting, weakness or dizziness. She sometimes gets sad about her chronic illness, but denies suicidal ideation or plan. She and her husband have an antagonistic relationship. He brought her here, today. There are no other known modifying factors.  HPI  Past Medical History:  Diagnosis Date  . Arthritis   . Asthma   . Cancer ([facility])   . Hypertension     Patient Active Problem List   Diagnosis Date Noted  . Chronic diarrhea 03/29/2017  . Urinary incontinence 08/03/2015  . Essential hypertension 05/13/2015  . Chronic back pain 05/13/2015  . Acute bronchitis 02/23/2013  . Dyspnea 02/23/2013  . Paroxysmal atrial fibrillation ([place]) 02/23/2013  . Arthritis 02/23/2013  . Goiter with tracheal compression and mild stridor 02/23/2013  . Asthma, chronic 02/23/2013  . Hypokalemia 02/23/2013  . Multiple myeloma  in remission ([place]) 11/18/2011    Past Surgical History:  Procedure Laterality Date  . APPENDECTOMY    . CHOLECYSTECTOMY    . ESOPHAGOGASTRODUODENOSCOPY N/A 11/24/2015   Procedure: ESOPHAGOGASTRODUODENOSCOPY (EGD);  Surgeon: Clarene Essex, MD;  Location[facility];  Service: Endoscopy;  Laterality: N/A;  . TUBAL LIGATION      OB History    No data available       Home Medications    Prior to Admission medications   Medication Sig Start Date End Date Taking? Authorizing Provider  albuterol (PROVENTIL HFA;VENTOLIN HFA) 108 (90 Base) MCG/ACT inhaler Inhale 2 puffs into the lungs every 4 (four) hours as needed for wheezing or shortness of breath. 12/31/16  Yes Amao, Charlane Ferretti, MD  diphenoxylate-atropine (LOMOTIL) 2.5-0.025 MG tablet TAKE 1 TABLET BY MOUTH 3 TIMES DAILY AS NEEDED FOR DIARRHEA OR loose stools 03/29/17  Yes Amao, Enobong, MD  losartan (COZAAR) 100 MG tablet Take 1 tablet (100 mg total) by mouth daily. 01/14/17 05/18/18 Yes Arnoldo Morale, MD  potassium chloride (KLOR-CON) 20 MEQ packet Take 20 mEq by mouth daily. Patient not taking: Reported on 03/29/2017 03/29/17   Arnoldo Morale, MD  predniSONE (DELTASONE) 20 MG tablet Take 1 tablet (20 mg total) by mouth 2 (two) times daily. 05/18/17   Daleen Bo, MD    Family History Family History  Problem Relation Age of Onset  . Family history unknown: Yes    Social History Social History  Substance Use Topics  . Smoking status: Never Smoker  . Smokeless tobacco: Never Used  .  Alcohol use No     Allergies   Gabapentin and Aspirin   Review of Systems Review of Systems  All other systems reviewed and are negative.    Physical Exam Updated Vital Signs BP (!) 162/115 (BP Location: Left Arm)   Pulse 93   Temp 98.8 F (37.1 C) (Oral)   Resp 20   Ht '5\' 1"'  (1.549 m)   Wt 61.2 kg (135 lb)   SpO2 100%   BMI 25.51 kg/m   Physical Exam  Constitutional: She is oriented to person, place, and time. She appears well-developed.   Elderly, overweight, frail  HENT:  Head: Normocephalic and atraumatic.  Eyes: Pupils are equal, round, and reactive to light. Conjunctivae and EOM are normal.  Neck: Normal range of motion and phonation normal. Neck supple.  Cardiovascular: Normal rate and regular rhythm.   Pulmonary/Chest: Effort normal and breath sounds normal. She exhibits no tenderness.  Abdominal: Soft. She exhibits no distension. There is no tenderness. There is no guarding.  Musculoskeletal:  Somewhat limited motion, lower back secondary to pain. She is able to sit on the stretcher. No focal areas of tenderness of the back or spine.  Neurological: She is alert and oriented to person, place, and time. She exhibits normal muscle tone.  Skin: Skin is warm and dry.  Psychiatric: She has a normal mood and affect. Her behavior is normal. Judgment and thought content normal.  Nursing note and vitals reviewed.    ED Treatments / Results  Labs (all labs ordered are listed, but only abnormal results are displayed) Labs Reviewed - No data to display  EKG  EKG Interpretation None       Radiology Ct Lumbar Spine Wo Contrast  Result Date: 05/18/2017 CLINICAL DATA:  Low back pain.  History of multiple myeloma. EXAM: CT LUMBAR SPINE WITHOUT CONTRAST TECHNIQUE: Multidetector CT imaging of the lumbar spine was performed without intravenous contrast administration. Multiplanar CT image reconstructions were also generated. COMPARISON:  Lumbar spine radiographs 09/23/2016 and MRI 02/19/2011 FINDINGS: Segmentation: 5 lumbar type vertebrae. Alignment: Slight retrolisthesis is again seen of L2 on L3 and measures 3 mm. 3 mm anterolisthesis of L5 on S1 is noted as well. Vertebrae: Diffusely heterogeneous appearance of the bones with numerous lucent lesions throughout the the vertebral bodies and posterior elements as well as sacrum and visualized iliac bones. Vertebral body heights are preserved without evidence of fracture. There is  a chronic L2 inferior endplate Schmorl's node. Paraspinal and other soft tissues: Abdominal aortic atherosclerosis without aneurysm. Disc levels: T12-L1:  Right greater than left facet arthrosis.  No stenosis. L1-2: Asymmetric right facet arthrosis with prominent spurring results in severe right neural foraminal stenosis, similar to the prior MRI. No spinal stenosis. L2-3: Mild disc bulging, endplate spurring, and moderate facet and ligamentum flavum hypertrophy without evidence of significant stenosis. L3-4: Minimal disc bulging and moderate facet hypertrophy without evidence of significant stenosis. L4-5: Mild disc bulging and advanced facet hypertrophy result in mild bilateral lateral recess and mild bilateral neural foraminal stenosis, not significantly changed. Solid right facet ankylosis. L5-S1: Anterolisthesis with mild disc uncovering which may have mildly progressed from the prior MRI. Slight bulging of uncovered disc, endplate spurring, and advanced facet arthrosis result in moderate bilateral neural foraminal stenosis potentially affecting the L5 nerve roots, stable to minimally increased. Bilateral facet ankylosis. Likely mild bilateral lateral recess stenosis. No significant generalized spinal stenosis. IMPRESSION: 1. Diffusely abnormal appearance of the bones consistent with history of multiple myeloma. No fracture  identified. 2. Lumbar spondylosis and advanced facet arthrosis as above. Moderate bilateral foraminal stenosis at L5-S1. Electronically Signed   By: Logan Bores M.D.   On: 05/18/2017 11:04    Procedures Procedures (including critical care time)  Medications Ordered in ED Medications  oxyCODONE (Oxy IR/ROXICODONE) immediate release tablet 5 mg (5 mg Oral Given 05/18/17 1027)  predniSONE (DELTASONE) tablet 60 mg (60 mg Oral Given 05/18/17 1120)     Initial Impression / Assessment and Plan / ED Course  I have reviewed the triage vital signs and the nursing notes.  Pertinent labs  & imaging results that were available during my care of the patient were reviewed by me and considered in my medical decision making (see chart for details).      Patient Vitals for the past 24 hrs:  BP Temp Temp src Pulse Resp SpO2 Height Weight  05/18/17 0957 - - - - - - '5\' 1"'  (1.549 m) 61.2 kg (135 lb)  05/18/17 0956 (!) 162/115 98.8 F (37.1 C) Oral 93 20 100 % - -    11:53 AM Reevaluation with update and discussion. After initial assessment and treatment, an updated evaluation reveals Pain controlled now, findings discussed with patient, all questions answered. Lionel Woodberry L    Final Clinical Impressions(s) / ED Diagnoses   Final diagnoses:  Chronic low back pain without sciatica, unspecified back pain laterality  Lumbar spondylosis  Lumbar facet arthropathy (HCC)   Chronic low back pain, likely multifactorial, with mildly progressive spondylosis and facet arthropathy. No apparent fracture, or significantly progressed multiple myeloma, in the area imaged.  Nursing Notes Reviewed/ Care Coordinated Applicable Imaging Reviewed Interpretation of Laboratory Data incorporated into ED treatment  The patient appears reasonably screened and/or stabilized for discharge and I doubt any other medical condition or other [facility] requiring further screening, evaluation, or treatment in the ED at this time prior to discharge.  Plan: Home Medications- continue current medications; Home Treatments- heat to affected area; return here if the recommended treatment, does not improve the symptoms; Recommended follow up- PCP when necessary    New Prescriptions New Prescriptions   PREDNISONE (DELTASONE) 20 MG TABLET    Take 1 tablet (20 mg total) by mouth 2 (two) times daily.     Daleen Bo, MD 05/18/17 1155

## 2017-05-18 NOTE — ED Notes (Signed)
Pt provided with meal tray.

## 2017-05-18 NOTE — ED Notes (Signed)
Bed: IT19 Expected date:  Expected time:  Means of arrival:  Comments: Pt in room

## 2017-05-18 NOTE — ED Triage Notes (Signed)
Patient present with c/o bone cancer pain that been going on for 2 month with no relief and continue to get worse. Pt c/o lower back pain and tailbony area. Pt rating 10/10 pain.

## 2017-05-20 ENCOUNTER — Telehealth: Payer: Self-pay | Admitting: Internal Medicine

## 2017-05-20 NOTE — Telephone Encounter (Signed)
sw pt to confirm 8/7 and 8/13 appts per sch msg

## 2017-05-28 ENCOUNTER — Other Ambulatory Visit (HOSPITAL_BASED_OUTPATIENT_CLINIC_OR_DEPARTMENT_OTHER): Payer: Medicare Other

## 2017-05-28 DIAGNOSIS — C9001 Multiple myeloma in remission: Secondary | ICD-10-CM

## 2017-05-28 LAB — COMPREHENSIVE METABOLIC PANEL
ALT: 14 U/L (ref 0–55)
AST: 18 U/L (ref 5–34)
Albumin: 3.6 g/dL (ref 3.5–5.0)
Alkaline Phosphatase: 122 U/L (ref 40–150)
Anion Gap: 10 mEq/L (ref 3–11)
BILIRUBIN TOTAL: 0.86 mg/dL (ref 0.20–1.20)
BUN: 10.4 mg/dL (ref 7.0–26.0)
CHLORIDE: 105 meq/L (ref 98–109)
CO2: 28 meq/L (ref 22–29)
CREATININE: 0.8 mg/dL (ref 0.6–1.1)
Calcium: 9.7 mg/dL (ref 8.4–10.4)
EGFR: 86 mL/min/{1.73_m2} — ABNORMAL LOW (ref >90)
Glucose: 133 mg/dl (ref 70–140)
Potassium: 3.5 mEq/L (ref 3.5–5.1)
SODIUM: 143 meq/L (ref 136–145)
TOTAL PROTEIN: 7.6 g/dL (ref 6.4–8.3)

## 2017-05-28 LAB — CBC WITH DIFFERENTIAL/PLATELET
BASO%: 0.1 % (ref 0.0–2.0)
Basophils Absolute: 0 10*3/uL (ref 0.0–0.1)
EOS%: 0.5 % (ref 0.0–7.0)
Eosinophils Absolute: 0.1 10*3/uL (ref 0.0–0.5)
HCT: 42 % (ref 34.8–46.6)
HGB: 13.8 g/dL (ref 11.6–15.9)
LYMPH%: 20.4 % (ref 14.0–49.7)
MCH: 30.7 pg (ref 25.1–34.0)
MCHC: 32.9 g/dL (ref 31.5–36.0)
MCV: 93.3 fL (ref 79.5–101.0)
MONO#: 0.6 10*3/uL (ref 0.1–0.9)
MONO%: 5.4 % (ref 0.0–14.0)
NEUT#: 7.7 10*3/uL — ABNORMAL HIGH (ref 1.5–6.5)
NEUT%: 73.6 % (ref 38.4–76.8)
Platelets: 163 10*3/uL (ref 145–400)
RBC: 4.5 10*6/uL (ref 3.70–5.45)
RDW: 13.8 % (ref 11.2–14.5)
WBC: 10.5 10*3/uL — ABNORMAL HIGH (ref 3.9–10.3)
lymph#: 2.1 10*3/uL (ref 0.9–3.3)

## 2017-05-28 LAB — LACTATE DEHYDROGENASE: LDH: 166 U/L (ref 125–245)

## 2017-05-29 LAB — BETA 2 MICROGLOBULIN, SERUM: BETA 2: 1.7 mg/L (ref 0.6–2.4)

## 2017-05-29 LAB — IGG, IGA, IGM
IgA, Qn, Serum: 239 mg/dL (ref 64–422)
IgG, Qn, Serum: 1156 mg/dL (ref 700–1600)
IgM, Qn, Serum: 196 mg/dL (ref 26–217)

## 2017-05-29 LAB — KAPPA/LAMBDA LIGHT CHAINS
Ig Kappa Free Light Chain: 15.6 mg/L (ref 3.3–19.4)
Ig Lambda Free Light Chain: 18.4 mg/L (ref 5.7–26.3)
KAPPA/LAMBDA FLC RATIO: 0.85 (ref 0.26–1.65)

## 2017-06-03 ENCOUNTER — Telehealth: Payer: Self-pay | Admitting: Internal Medicine

## 2017-06-03 ENCOUNTER — Encounter: Payer: Self-pay | Admitting: Internal Medicine

## 2017-06-03 ENCOUNTER — Ambulatory Visit (HOSPITAL_BASED_OUTPATIENT_CLINIC_OR_DEPARTMENT_OTHER): Payer: Medicare Other | Admitting: Internal Medicine

## 2017-06-03 VITALS — BP 169/92 | HR 67 | Temp 98.5°F | Resp 18 | Ht 61.0 in | Wt 159.4 lb

## 2017-06-03 DIAGNOSIS — G8929 Other chronic pain: Secondary | ICD-10-CM

## 2017-06-03 DIAGNOSIS — C9001 Multiple myeloma in remission: Secondary | ICD-10-CM | POA: Diagnosis not present

## 2017-06-03 DIAGNOSIS — M545 Low back pain: Secondary | ICD-10-CM | POA: Diagnosis not present

## 2017-06-03 DIAGNOSIS — M549 Dorsalgia, unspecified: Secondary | ICD-10-CM

## 2017-06-03 MED ORDER — TRAMADOL HCL 50 MG PO TABS: 50.0000 mg | ORAL_TABLET | Freq: Four times a day (QID) | ORAL | 0 refills | Status: DC | PRN

## 2017-06-03 NOTE — Progress Notes (Signed)
[facility] [phone]   Fax:(336) [phone]  OFFICE PROGRESS NOTE  Arnoldo Morale, MD [place] [address]  DIAGNOSIS: Multiple myeloma, IgG kappa subtype diagnosed in October 2008.   PRIOR THERAPY:  1. Status post radiotherapy to the lower lumbar spine and left pelvic area under the care of Dr. Sondra Come. The patient received a total dose of 3000 cGy. in 15 fractions between 10/14/2007 through 11/07/2007  2.Status post 6 cycles of systemic chemotherapy with Revlimid and Decadron. Last dose given April 2009.  3. status post autologous peripheral blood stem cell transplant on 04/01/2008 under the care of Dr. Valarie Merino at [facility].   CURRENT THERAPY: Zometa 4 mg IV given every 3 months.  INTERVAL HISTORY: Ruth Sanchez 71 y.o. female returns to the clinic today for six-month follow-up visit accompanied by her husband. The patient is feeling fine today with no specific complaints except for the persistent and chronic low back pain. She was seen recently at the emergency department and CT of the lumbar spine showed the persistent lytic lesion from her history of multiple myeloma with no acute fractures in addition to lumbar spondylosis and advanced facet arthrosis with moderate bilateral foraminal stenosis at to L5-S1. The patient denied having any recent weight loss or night sweats. She has no nausea, vomiting, diarrhea or constipation. She denied having any chest pain, shortness breath, cough or hemoptysis. She had repeat myeloma panel performed recently and she is here for evaluation and discussion of her lab results and treatment options.  MEDICAL HISTORY: Past Medical History:  Diagnosis Date  . Arthritis   . Asthma   . Cancer ([place])   . Hypertension     ALLERGIES:  is allergic to gabapentin and aspirin.  MEDICATIONS:  Current Outpatient Prescriptions  Medication Sig Dispense Refill  . albuterol (PROVENTIL HFA;VENTOLIN HFA)  108 (90 Base) MCG/ACT inhaler Inhale 2 puffs into the lungs every 4 (four) hours as needed for wheezing or shortness of breath. 1 Inhaler 3  . diphenoxylate-atropine (LOMOTIL) 2.5-0.025 MG tablet TAKE 1 TABLET BY MOUTH 3 TIMES DAILY AS NEEDED FOR DIARRHEA OR loose stools 40 tablet 1  . losartan (COZAAR) 100 MG tablet Take 1 tablet (100 mg total) by mouth daily. 30 tablet 3  . potassium chloride (KLOR-CON) 20 MEQ packet Take 20 mEq by mouth daily. (Patient not taking: Reported on 03/29/2017) 30 packet 2  . predniSONE (DELTASONE) 20 MG tablet Take 1 tablet (20 mg total) by mouth 2 (two) times daily. 10 tablet 0   No current facility-administered medications for this visit.     SURGICAL HISTORY:  Past Surgical History:  Procedure Laterality Date  . APPENDECTOMY    . CHOLECYSTECTOMY    . ESOPHAGOGASTRODUODENOSCOPY N/A 11/24/2015   Procedure: ESOPHAGOGASTRODUODENOSCOPY (EGD);  Surgeon: Clarene Essex, MD;  Location: [facility];  Service: Endoscopy;  Laterality: N/A;  . TUBAL LIGATION      REVIEW OF SYSTEMS:  Constitutional: positive for fatigue Eyes: negative Ears, nose, mouth, throat, and face: negative Respiratory: negative Cardiovascular: negative Gastrointestinal: negative Genitourinary:negative Integument/breast: negative Hematologic/lymphatic: negative Musculoskeletal:positive for arthralgias and back pain Neurological: negative Behavioral/Psych: negative Endocrine: negative Allergic/Immunologic: negative   PHYSICAL EXAMINATION: General appearance: alert, cooperative, fatigued and no distress Head: Normocephalic, without obvious abnormality, atraumatic Neck: no adenopathy Lymph nodes: Cervical, supraclavicular, and axillary nodes normal. Resp: clear to auscultation bilaterally Back: symmetric, no curvature. ROM normal. No CVA tenderness. Cardio: regular rate and rhythm, S1, S2 normal,  no murmur, click, rub or gallop GI: soft, non-tender; bowel sounds normal; no masses,  no  organomegaly Extremities: extremities normal, atraumatic, no cyanosis or edema Neurologic: Alert and oriented X 3, normal strength and tone. Normal symmetric reflexes. Normal coordination and gait   ECOG PERFORMANCE STATUS: 1 - Symptomatic but completely ambulatory  Blood pressure (!) 169/92, pulse 67, temperature 98.5 F (36.9 C), temperature source Oral, resp. rate 18, height _0  (1.549 m), weight 159 lb 6.4 oz (72.3 kg), SpO2 100 %.  LABORATORY DATA: Lab Results  Component Value Date   WBC 10.5 (H) 05/28/2017   HGB 13.8 05/28/2017   HCT 42.0 05/28/2017   MCV 93.3 05/28/2017   PLT 163 05/28/2017      Chemistry      Component Value Date/Time   NA 143 05/28/2017 1141   K 3.5 05/28/2017 1141   CL 105 12/31/2016 0911   CL 104 01/19/2013 1058   CO2 28 05/28/2017 1141   BUN 10.4 05/28/2017 1141   CREATININE 0.8 05/28/2017 1141      Component Value Date/Time   CALCIUM 9.7 05/28/2017 1141   ALKPHOS 122 05/28/2017 1141   AST 18 05/28/2017 1141   ALT 14 05/28/2017 1141   BILITOT 0.86 05/28/2017 1141     Other lab results:   RADIOGRAPHIC STUDIES: No results found.  ASSESSMENT AND PLAN: This is a very pleasant 71 years old African American female with history of multiple myeloma status post systemic chemotherapy with Revlimid and Decadron followed by this peripheral blood stem cell transplant and has been observation since June of 2009 with no significant evidence for disease progression.  The patient had recent myeloma panel that showed no findings for disease progression. I discussed the lab result with the patient and her husband today. I recommended for her to continue on observation with repeat myeloma panel in 6 months. For the bone disease from multiple myeloma, she will continue on Zometa every 3 months. For the chronic low back pain, I referred the patient for stability surgery for evaluation and management of her condition. I gave her one time refill of tramadol 50  mg by mouth every 6 hours as needed. She was advised that he'll not be able to continue refilling her pain medication and she may need to receive any further pain management from her primary care physician or orthopedic surgery. For hypertension, I strongly advised the patient to monitor her blood pressure closely at home and to consult with her primary care physician for adjustment of her medication. The patient was advised to call immediately if she has any concerning symptoms in the interval. All questions were answered. The patient knows to call the clinic with any problems, questions or concerns. We can certainly see the patient much sooner if necessary.  Disclaimer: This note was dictated with voice recognition software. Similar sounding words can inadvertently be transcribed and may not be corrected upon review.

## 2017-06-03 NOTE — Telephone Encounter (Signed)
Gave patient avs and calendar for her upcoming appointments.

## 2017-06-28 ENCOUNTER — Encounter: Payer: Self-pay | Admitting: Family Medicine

## 2017-06-28 ENCOUNTER — Ambulatory Visit: Payer: Medicare Other | Attending: Family Medicine | Admitting: Family Medicine

## 2017-06-28 VITALS — BP 154/96 | HR 99 | Temp 97.8°F | Ht 61.0 in | Wt 156.6 lb

## 2017-06-28 DIAGNOSIS — Z79891 Long term (current) use of opiate analgesic: Secondary | ICD-10-CM | POA: Insufficient documentation

## 2017-06-28 DIAGNOSIS — M62838 Other muscle spasm: Secondary | ICD-10-CM | POA: Insufficient documentation

## 2017-06-28 DIAGNOSIS — E2839 Other primary ovarian failure: Secondary | ICD-10-CM

## 2017-06-28 DIAGNOSIS — E876 Hypokalemia: Secondary | ICD-10-CM | POA: Insufficient documentation

## 2017-06-28 DIAGNOSIS — G8929 Other chronic pain: Secondary | ICD-10-CM | POA: Insufficient documentation

## 2017-06-28 DIAGNOSIS — Z9889 Other specified postprocedural states: Secondary | ICD-10-CM | POA: Diagnosis not present

## 2017-06-28 DIAGNOSIS — J4520 Mild intermittent asthma, uncomplicated: Secondary | ICD-10-CM | POA: Insufficient documentation

## 2017-06-28 DIAGNOSIS — C9001 Multiple myeloma in remission: Secondary | ICD-10-CM | POA: Diagnosis not present

## 2017-06-28 DIAGNOSIS — Z9851 Tubal ligation status: Secondary | ICD-10-CM | POA: Diagnosis not present

## 2017-06-28 DIAGNOSIS — I10 Essential (primary) hypertension: Secondary | ICD-10-CM | POA: Insufficient documentation

## 2017-06-28 DIAGNOSIS — Z9484 Stem cells transplant status: Secondary | ICD-10-CM | POA: Insufficient documentation

## 2017-06-28 DIAGNOSIS — M549 Dorsalgia, unspecified: Secondary | ICD-10-CM

## 2017-06-28 DIAGNOSIS — M545 Low back pain: Secondary | ICD-10-CM | POA: Insufficient documentation

## 2017-06-28 DIAGNOSIS — Z79899 Other long term (current) drug therapy: Secondary | ICD-10-CM | POA: Diagnosis not present

## 2017-06-28 DIAGNOSIS — Z9221 Personal history of antineoplastic chemotherapy: Secondary | ICD-10-CM | POA: Insufficient documentation

## 2017-06-28 DIAGNOSIS — Z9049 Acquired absence of other specified parts of digestive tract: Secondary | ICD-10-CM | POA: Diagnosis not present

## 2017-06-28 MED ORDER — METOPROLOL TARTRATE 25 MG PO TABS: 25.0000 mg | ORAL_TABLET | Freq: Two times a day (BID) | ORAL | 3 refills | Status: DC

## 2017-06-28 MED ORDER — TRAMADOL HCL 50 MG PO TABS: 50.0000 mg | ORAL_TABLET | Freq: Two times a day (BID) | ORAL | 2 refills | Status: DC | PRN

## 2017-06-28 MED ORDER — NAPROXEN 500 MG PO TABS: 500.0000 mg | ORAL_TABLET | Freq: Two times a day (BID) | ORAL | 2 refills | Status: DC

## 2017-06-28 MED ORDER — LOSARTAN POTASSIUM 100 MG PO TABS
100.0000 mg | ORAL_TABLET | Freq: Every day | ORAL | 3 refills | Status: DC
Start: 2017-06-28 — End: 2017-09-27

## 2017-06-28 MED ORDER — TIZANIDINE HCL 4 MG PO TABS: 4.0000 mg | ORAL_TABLET | Freq: Three times a day (TID) | ORAL | 3 refills | Status: DC | PRN

## 2017-06-28 NOTE — Patient Instructions (Signed)

## 2017-06-28 NOTE — Progress Notes (Signed)
Subjective:  Patient ID: Ruth Sanchez, female    DOB: [birth year]  Age: 71 y.o. MRN: [id number]  CC: Hypertension   HPI LU PARADISE  is a 71 year old female with a history of Multiple Myeloma (status post chemotherapy, status post stem cell transplant in remission managed by oncology and currently on Zometa injections every 3 months), chronic low back pain, Hypertension, intermittent Asthma here for a follow up of hypertension.  Since her last visit she has had ED visits due to back pain and running out of her tramadol. CT lumbar spine from her last visit in 04/2017 revealed diffusely abnormal appearance of the bones consistent with history of multiple myeloma, lumbar spondylosis and advanced facet atherosis, moderate bilateral foraminal stenosis at L5-S1. She continues to have chronic low back pain which does not radiate to her lower extremities; use of a back brace and application of ice hot helps her symptoms to an extent and she is requesting a refill of tramadol.  She also complains of cramping of her calf muscles and toes and symptoms of symptoms precipitated by cold air. Her blood pressure is elevated that she is taking losartan. This seems metoprolol had been discontinued at her last ED visit.   Past Medical History:  Diagnosis Date  . Arthritis   . Asthma   . Cancer ([place])   . Hypertension     Past Surgical History:  Procedure Laterality Date  . APPENDECTOMY    . CHOLECYSTECTOMY    . ESOPHAGOGASTRODUODENOSCOPY N/A 11/24/2015   Procedure: ESOPHAGOGASTRODUODENOSCOPY (EGD);  Surgeon: Clarene Essex, MD;  Location: [facility];  Service: Endoscopy;  Laterality: N/A;  . TUBAL LIGATION      Allergies  Allergen Reactions  . Gabapentin Swelling and Other (See Comments)    Reaction:  Lip swelling  . Aspirin Itching and Rash     Outpatient Medications Prior to Visit  Medication Sig Dispense Refill  . albuterol (PROVENTIL HFA;VENTOLIN HFA) 108 (90 Base) MCG/ACT inhaler  Inhale 2 puffs into the lungs every 4 (four) hours as needed for wheezing or shortness of breath. 1 Inhaler 3  . diphenoxylate-atropine (LOMOTIL) 2.5-0.025 MG tablet TAKE 1 TABLET BY MOUTH 3 TIMES DAILY AS NEEDED FOR DIARRHEA OR loose stools 40 tablet 1  . losartan (COZAAR) 100 MG tablet Take 1 tablet (100 mg total) by mouth daily. 30 tablet 3  . traMADol (ULTRAM) 50 MG tablet Take 1 tablet (50 mg total) by mouth every 6 (six) hours as needed. 60 tablet 0  . potassium chloride (KLOR-CON) 20 MEQ packet Take 20 mEq by mouth daily. (Patient not taking: Reported on 03/29/2017) 30 packet 2  . predniSONE (DELTASONE) 20 MG tablet Take 1 tablet (20 mg total) by mouth 2 (two) times daily. (Patient not taking: Reported on 06/28/2017) 10 tablet 0   No facility-administered medications prior to visit.     ROS Review of Systems Constitutional: Negative for activity change, appetite change and fatigue.  HENT: Negative for congestion, sinus pressure and sore throat.   Eyes: Negative for visual disturbance.  Respiratory: Negative for cough, chest tightness, shortness of breath and wheezing.   Cardiovascular: Negative for chest pain and palpitations.  Gastrointestinal: Negative for abdominal pain, constipation and abdominal distention.  positive for diarrhea Endocrine: Negative for polydipsia.  Genitourinary: Negative for dysuria and frequency.  Musculoskeletal: see hpi.  Skin: Negative for rash.  Neurological: Negative for tremors, light-headedness and numbness.  Hematological: Does not bruise/bleed easily.  Psychiatric/Behavioral: Negative for behavioral problems and  agitation.  Objective:  BP (!) 154/96   Pulse 99   Temp 97.8 F (36.6 C) (Oral)   Ht '5\' 1"'  (1.549 m)   Wt 156 lb 9.6 oz (71 kg)   SpO2 99%   BMI 29.59 kg/m   BP/Weight 06/28/2017 06/03/2017 10/27/2692  Systolic BP 854 627 035  Diastolic BP 96 92 96  Wt. (Lbs) 156.6 159.4 135  BMI 29.59 30.12 25.51      Physical  Exam Constitutional: She is oriented to person, place, and time. She appears well-developed and well-nourished.  Neck: no JVD Cardiovascular: Normal heart sounds and intact distal pulses.  .   No murmur heard. Pulmonary/Chest: Effort normal and breath sounds normal. She has no wheezes. She has no rales. She exhibits no tenderness.  Abdominal: Soft. Bowel sounds are normal. She exhibits no distension and no mass. There is no tenderness.  Musculoskeletal: Kyphosis of thoracic spine. She exhibits tenderness (tendernes to palpation of lumbar spine).  Neurological: She is alert and oriented to person, place, and time.  Psych: normal     CMP Latest Ref Rng & Units 05/28/2017 02/21/2017 12/31/2016  Glucose 70 - 140 mg/dl 133 101 88  BUN 7.0 - 26.0 mg/dL 10.4 6.4(L) 10  Creatinine 0.6 - 1.1 mg/dL 0.8 0.7 0.71  Sodium 136 - 145 mEq/L 143 145 144  Potassium 3.5 - 5.1 mEq/L 3.5 2.9(LL) 3.3(L)  Chloride 98 - 110 mmol/L - - 105  CO2 22 - 29 mEq/L '28 28 25  ' Calcium 8.4 - 10.4 mg/dL 9.7 9.6 9.2  Total Protein 6.4 - 8.3 g/dL 7.6 8.0 7.6  Total Bilirubin 0.20 - 1.20 mg/dL 0.86 1.14 1.2  Alkaline Phos 40 - 150 U/L 122 95 83  AST 5 - 34 U/L '18 20 17  ' ALT 0 - 55 U/L '14 13 10    ' Assessment & Plan:   1. Multiple myeloma in remission ([place]) Status post chemotherapy and stem cell transplant No evidence of disease progression as per oncology Continue Zometa - DG Bone Density; Future - traMADol (ULTRAM) 50 MG tablet; Take 1 tablet (50 mg total) by mouth every 12 (twelve) hours as needed.  Dispense: 60 tablet; Refill: 2  2. Chronic midline back pain, unspecified back location Continue with back brace Apply heat - naproxen (NAPROSYN) 500 MG tablet; Take 1 tablet (500 mg total) by mouth 2 (two) times daily with a meal.  Dispense: 60 tablet; Refill: 2 - DG Bone Density; Future - tiZANidine (ZANAFLEX) 4 MG tablet; Take 1 tablet (4 mg total) by mouth every 8 (eight) hours as needed for muscle spasms.   Dispense: 90 tablet; Refill: 3 - traMADol (ULTRAM) 50 MG tablet; Take 1 tablet (50 mg total) by mouth every 12 (twelve) hours as needed.  Dispense: 60 tablet; Refill: 2  3. Essential hypertension Uncontrolled Metoprolol added to regimen - metoprolol tartrate (LOPRESSOR) 25 MG tablet; Take 1 tablet (25 mg total) by mouth 2 (two) times daily.  Dispense: 60 tablet; Refill: 3 - Basic Metabolic Panel - losartan (COZAAR) 100 MG tablet; Take 1 tablet (100 mg total) by mouth daily.  Dispense: 30 tablet; Refill: 3  4. Hypokalemia She has been unable to afford liquid potassium Unable to swallow potassium pills  5. Estrogen deficiency - DG Bone Density; Future  6. Muscle spasm Placed on tizanidine   Meds ordered this encounter  Medications  . naproxen (NAPROSYN) 500 MG tablet    Sig: Take 1 tablet (500 mg total) by mouth 2 (two) times  daily with a meal.    Dispense:  60 tablet    Refill:  2  . tiZANidine (ZANAFLEX) 4 MG tablet    Sig: Take 1 tablet (4 mg total) by mouth every 8 (eight) hours as needed for muscle spasms.    Dispense:  90 tablet    Refill:  3  . metoprolol tartrate (LOPRESSOR) 25 MG tablet    Sig: Take 1 tablet (25 mg total) by mouth 2 (two) times daily.    Dispense:  60 tablet    Refill:  3  . DISCONTD: traMADol (ULTRAM) 50 MG tablet    Sig: Take 1 tablet (50 mg total) by mouth every 12 (twelve) hours as needed.    Dispense:  60 tablet    Refill:  2  . losartan (COZAAR) 100 MG tablet    Sig: Take 1 tablet (100 mg total) by mouth daily.    Dispense:  30 tablet    Refill:  3    Discontinue previous dose  . traMADol (ULTRAM) 50 MG tablet    Sig: Take 1 tablet (50 mg total) by mouth every 12 (twelve) hours as needed.    Dispense:  60 tablet    Refill:  2    Follow-up: Return in about 3 months (around 09/27/2017) for follow up on Hypertension.   Arnoldo Morale MD

## 2017-06-29 LAB — BASIC METABOLIC PANEL
BUN/Creatinine Ratio: 17 (ref 12–28)
BUN: 13 mg/dL (ref 8–27)
CALCIUM: 9.4 mg/dL (ref 8.7–10.3)
CO2: 21 mmol/L (ref 20–29)
CREATININE: 0.75 mg/dL (ref 0.57–1.00)
Chloride: 102 mmol/L (ref 96–106)
GFR, EST AFRICAN AMERICAN: 92 mL/min/{1.73_m2} (ref >59)
GFR, EST NON AFRICAN AMERICAN: 80 mL/min/{1.73_m2} (ref >59)
Glucose: 65 mg/dL (ref 65–99)
POTASSIUM: 3.5 mmol/L (ref 3.5–5.2)
Sodium: 141 mmol/L (ref 134–144)

## 2017-07-01 ENCOUNTER — Telehealth: Payer: Self-pay

## 2017-07-01 NOTE — Telephone Encounter (Signed)
Pt was called and a vm was left informing pt to return phone call for lab results.

## 2017-07-04 ENCOUNTER — Telehealth: Payer: Self-pay

## 2017-07-04 NOTE — Telephone Encounter (Signed)
Pt was called and a VM was left informing pt to return phone call for lab results. 

## 2017-07-15 ENCOUNTER — Ambulatory Visit (HOSPITAL_BASED_OUTPATIENT_CLINIC_OR_DEPARTMENT_OTHER): Payer: Medicare Other

## 2017-07-15 VITALS — BP 161/102 | HR 80 | Resp 18

## 2017-07-15 DIAGNOSIS — C9001 Multiple myeloma in remission: Secondary | ICD-10-CM

## 2017-07-15 MED ORDER — ZOLEDRONIC ACID 4 MG/100ML IV SOLN
4.0000 mg | Freq: Once | INTRAVENOUS | Status: AC
  Administered 2017-07-15: 4 mg via INTRAVENOUS
  Filled 2017-07-15: qty 100

## 2017-07-15 MED ORDER — SODIUM CHLORIDE 0.9 % IV SOLN
Freq: Once | INTRAVENOUS | Status: AC
  Administered 2017-07-15: 14:00:00 via INTRAVENOUS

## 2017-07-15 NOTE — Patient Instructions (Signed)

## 2017-07-17 ENCOUNTER — Ambulatory Visit (INDEPENDENT_AMBULATORY_CARE_PROVIDER_SITE_OTHER): Payer: Self-pay | Admitting: Orthopaedic Surgery

## 2017-08-26 ENCOUNTER — Other Ambulatory Visit: Payer: Self-pay | Admitting: Family Medicine

## 2017-08-26 DIAGNOSIS — C9001 Multiple myeloma in remission: Secondary | ICD-10-CM

## 2017-08-26 DIAGNOSIS — G8929 Other chronic pain: Secondary | ICD-10-CM

## 2017-08-26 DIAGNOSIS — M549 Dorsalgia, unspecified: Secondary | ICD-10-CM

## 2017-09-16 DIAGNOSIS — J45901 Unspecified asthma with (acute) exacerbation: Secondary | ICD-10-CM | POA: Diagnosis not present

## 2017-09-16 DIAGNOSIS — I10 Essential (primary) hypertension: Secondary | ICD-10-CM | POA: Diagnosis not present

## 2017-09-27 ENCOUNTER — Encounter: Payer: Self-pay | Admitting: Family Medicine

## 2017-09-27 ENCOUNTER — Ambulatory Visit: Payer: Medicare Other | Attending: Family Medicine | Admitting: Family Medicine

## 2017-09-27 DIAGNOSIS — Z9851 Tubal ligation status: Secondary | ICD-10-CM | POA: Insufficient documentation

## 2017-09-27 DIAGNOSIS — J4520 Mild intermittent asthma, uncomplicated: Secondary | ICD-10-CM | POA: Insufficient documentation

## 2017-09-27 DIAGNOSIS — G8929 Other chronic pain: Secondary | ICD-10-CM | POA: Diagnosis not present

## 2017-09-27 DIAGNOSIS — Z79899 Other long term (current) drug therapy: Secondary | ICD-10-CM | POA: Diagnosis not present

## 2017-09-27 DIAGNOSIS — Z9049 Acquired absence of other specified parts of digestive tract: Secondary | ICD-10-CM | POA: Insufficient documentation

## 2017-09-27 DIAGNOSIS — I10 Essential (primary) hypertension: Secondary | ICD-10-CM | POA: Insufficient documentation

## 2017-09-27 DIAGNOSIS — Z9889 Other specified postprocedural states: Secondary | ICD-10-CM | POA: Diagnosis not present

## 2017-09-27 DIAGNOSIS — M549 Dorsalgia, unspecified: Secondary | ICD-10-CM

## 2017-09-27 DIAGNOSIS — C9001 Multiple myeloma in remission: Secondary | ICD-10-CM | POA: Diagnosis not present

## 2017-09-27 MED ORDER — ALBUTEROL SULFATE HFA 108 (90 BASE) MCG/ACT IN AERS: 2.0000 | INHALATION_SPRAY | RESPIRATORY_TRACT | 3 refills | Status: DC | PRN

## 2017-09-27 MED ORDER — LOSARTAN POTASSIUM 100 MG PO TABS: 100.0000 mg | ORAL_TABLET | Freq: Every day | ORAL | 3 refills | Status: DC

## 2017-09-27 MED ORDER — BUDESONIDE-FORMOTEROL FUMARATE 160-4.5 MCG/ACT IN AERO: 2.0000 | INHALATION_SPRAY | Freq: Two times a day (BID) | RESPIRATORY_TRACT | 3 refills | Status: DC

## 2017-09-27 MED ORDER — METOPROLOL TARTRATE 25 MG PO TABS: 25.0000 mg | ORAL_TABLET | Freq: Two times a day (BID) | ORAL | 3 refills | Status: DC

## 2017-09-27 MED ORDER — TRAMADOL HCL 50 MG PO TABS: 50.0000 mg | ORAL_TABLET | Freq: Two times a day (BID) | ORAL | 2 refills | Status: DC | PRN

## 2017-09-27 NOTE — Patient Instructions (Signed)

## 2017-09-27 NOTE — Progress Notes (Signed)
Subjective:  Patient ID: Ruth Sanchez, female    DOB: [birth year]  Age: 71 y.o. MRN: [id number]  CC: Hypertension   HPI [name] is a 71 year old female with a history of Multiple Myeloma (status post chemotherapy, status post stem cell transplant in remission managed by oncology and currently on Zometa injections every 3 months), chronic low back pain, Hypertension, intermittent Asthma here for a follow up visit accompanied by her niece.  She has needed her MDI more than usual due to increased wheezing but denies dyspnea; she Is currently not on any controller medications.  Her blood pressure is elevated and she has been taking only Losartan but not metoprolol as she states she was unaware she was on two antihypertensives.  Her last visit to her Oncologist - Dr Earlie Server for follow up of her Multiple Myeloma was in 06/20/17 and her last infusion was on 07/15/17. She complains of chronic low back pain and has run out of Tramadol. Her back brace does not do much any more in terms of pain control. Pain is rated as severe and does not radiate.  Past Medical History:  Diagnosis Date  . Arthritis   . Asthma   . Cancer ([place])   . Hypertension     Past Surgical History:  Procedure Laterality Date  . APPENDECTOMY    . CHOLECYSTECTOMY    . ESOPHAGOGASTRODUODENOSCOPY N/A 11/24/2015   Procedure: ESOPHAGOGASTRODUODENOSCOPY (EGD);  Surgeon: Clarene Essex, MD;  Location: [facility];  Service: Endoscopy;  Laterality: N/A;  . TUBAL LIGATION      Allergies  Allergen Reactions  . Gabapentin Swelling and Other (See Comments)    Reaction:  Lip swelling  . Aspirin Itching and Rash     Outpatient Medications Prior to Visit  Medication Sig Dispense Refill  . benzonatate (TESSALON) 100 MG capsule TAKE 1 to 2 CAPSULES BY MOUTH EVERY 8 HOURS AS NEEDED FOR COUGH  0  . diphenoxylate-atropine (LOMOTIL) 2.5-0.025 MG tablet TAKE 1 TABLET BY MOUTH 3 TIMES DAILY AS NEEDED FOR DIARRHEA OR loose  stools 40 tablet 1  . albuterol (PROVENTIL HFA;VENTOLIN HFA) 108 (90 Base) MCG/ACT inhaler Inhale 2 puffs into the lungs every 4 (four) hours as needed for wheezing or shortness of breath. 1 Inhaler 3  . traMADol (ULTRAM) 50 MG tablet Take 1 tablet (50 mg total) by mouth every 12 (twelve) hours as needed. 60 tablet 2  . naproxen (NAPROSYN) 500 MG tablet Take 1 tablet (500 mg total) by mouth 2 (two) times daily with a meal. (Patient not taking: Reported on 09/27/2017) 60 tablet 2  . potassium chloride (KLOR-CON) 20 MEQ packet Take 20 mEq by mouth daily. (Patient not taking: Reported on 03/29/2017) 30 packet 2  . predniSONE (DELTASONE) 20 MG tablet Take 1 tablet (20 mg total) by mouth 2 (two) times daily. (Patient not taking: Reported on 06/28/2017) 10 tablet 0  . tiZANidine (ZANAFLEX) 4 MG tablet Take 1 tablet (4 mg total) by mouth every 8 (eight) hours as needed for muscle spasms. (Patient not taking: Reported on 09/27/2017) 90 tablet 3  . losartan (COZAAR) 100 MG tablet Take 1 tablet (100 mg total) by mouth daily. 30 tablet 3  . metoprolol tartrate (LOPRESSOR) 25 MG tablet Take 1 tablet (25 mg total) by mouth 2 (two) times daily. (Patient not taking: Reported on 09/27/2017) 60 tablet 3   No facility-administered medications prior to visit.     ROS Review of Systems  Constitutional: Negative for activity change,  appetite change and fatigue.  HENT: Negative for congestion, sinus pressure and sore throat.   Eyes: Negative for visual disturbance.  Respiratory: Positive for wheezing. Negative for cough, chest tightness and shortness of breath.   Cardiovascular: Negative for chest pain and palpitations.  Gastrointestinal: Negative for abdominal distention, abdominal pain and constipation.  Endocrine: Negative for polydipsia.  Genitourinary: Negative for dysuria and frequency.  Musculoskeletal: Positive for back pain. Negative for arthralgias.  Skin: Negative for rash.  Neurological: Negative for  tremors, light-headedness and numbness.  Hematological: Does not bruise/bleed easily.  Psychiatric/Behavioral: Negative for agitation and behavioral problems.    Objective:  BP (!) 169/100   Pulse (!) 105   Temp 98.8 F (37.1 C) (Oral)   Ht '5\' 1"'  (1.549 m)   Wt 159 lb (72.1 kg)   SpO2 98%   BMI 30.04 kg/m   BP/Weight 09/27/2017 5/88/5027 04/24/1286  Systolic BP 867 672 094  Diastolic BP 709 628 96  Wt. (Lbs) 159 - 156.6  BMI 30.04 - 29.59      Physical Exam  Constitutional: She is oriented to person, place, and time. She appears well-developed and well-nourished.  Cardiovascular: Normal rate, normal heart sounds and intact distal pulses.  No murmur heard. Pulmonary/Chest: Effort normal and breath sounds normal. She has no wheezes. She has no rales. She exhibits no tenderness.  Abdominal: Soft. Bowel sounds are normal. She exhibits no distension and no mass. There is no tenderness.  Musculoskeletal: She exhibits tenderness (TTP across lumbar spine).  Neurological: She is alert and oriented to person, place, and time.     Assessment & Plan:   1. Multiple myeloma in remission ([place]) S/p chemo and stem cell transplant Continue Zometa Follow up with Oncology - traMADol (ULTRAM) 50 MG tablet; Take 1 tablet (50 mg total) by mouth every 12 (twelve) hours as needed.  Dispense: 60 tablet; Refill: 2  2. Chronic midline back pain, unspecified back location Secondary to bone disease from Multiple Myeloma - traMADol (ULTRAM) 50 MG tablet; Take 1 tablet (50 mg total) by mouth every 12 (twelve) hours as needed.  Dispense: 60 tablet; Refill: 2 - Drug Screen 11 w/Conf, Ser  3. Essential hypertension Uncontrolled I have again emphasized she needs to be on two antihypertensives not one and her niece will see to it that she picks up metoprolol from the pharmacy in addition to Losartan - metoprolol tartrate (LOPRESSOR) 25 MG tablet; Take 1 tablet (25 mg total) by mouth 2 (two) times daily.   Dispense: 60 tablet; Refill: 3 - losartan (COZAAR) 100 MG tablet; Take 1 tablet (100 mg total) by mouth daily.  Dispense: 30 tablet; Refill: 3 - CMP14+EGFR  4. Mild intermittent chronic asthma without complication Uncontrolled due to increasing use of MDI Will place on controller medication - Symbicort - albuterol (PROVENTIL HFA;VENTOLIN HFA) 108 (90 Base) MCG/ACT inhaler; Inhale 2 puffs into the lungs every 4 (four) hours as needed for wheezing or shortness of breath.  Dispense: 1 Inhaler; Refill: 3   Meds ordered this encounter  Medications  . traMADol (ULTRAM) 50 MG tablet    Sig: Take 1 tablet (50 mg total) by mouth every 12 (twelve) hours as needed.    Dispense:  60 tablet    Refill:  2  . metoprolol tartrate (LOPRESSOR) 25 MG tablet    Sig: Take 1 tablet (25 mg total) by mouth 2 (two) times daily.    Dispense:  60 tablet    Refill:  3  . losartan (  COZAAR) 100 MG tablet    Sig: Take 1 tablet (100 mg total) by mouth daily.    Dispense:  30 tablet    Refill:  3    Discontinue previous dose  . albuterol (PROVENTIL HFA;VENTOLIN HFA) 108 (90 Base) MCG/ACT inhaler    Sig: Inhale 2 puffs into the lungs every 4 (four) hours as needed for wheezing or shortness of breath.    Dispense:  1 Inhaler    Refill:  3  . budesonide-formoterol (SYMBICORT) 160-4.5 MCG/ACT inhaler    Sig: Inhale 2 puffs into the lungs 2 (two) times daily.    Dispense:  1 Inhaler    Refill:  3    Follow-up: Return in about 3 months (around 12/26/2017) for follow up of chronic medical conditions.   Arnoldo Morale MD

## 2017-09-27 NOTE — Progress Notes (Signed)
Pt is taking losartan 100 mg.

## 2017-09-30 LAB — CMP14+EGFR
ALBUMIN: 4.2 g/dL (ref 3.5–4.8)
ALK PHOS: 118 IU/L — AB (ref 39–117)
ALT: 17 IU/L (ref 0–32)
AST: 23 IU/L (ref 0–40)
Albumin/Globulin Ratio: 1.3 (ref 1.2–2.2)
BUN / CREAT RATIO: 17 (ref 12–28)
BUN: 15 mg/dL (ref 8–27)
Bilirubin Total: 0.8 mg/dL (ref 0.0–1.2)
CALCIUM: 9.9 mg/dL (ref 8.7–10.3)
CO2: 27 mmol/L (ref 20–29)
CREATININE: 0.88 mg/dL (ref 0.57–1.00)
Chloride: 102 mmol/L (ref 96–106)
GFR calc Af Amer: 75 mL/min/{1.73_m2} (ref >59)
GFR, EST NON AFRICAN AMERICAN: 65 mL/min/{1.73_m2} (ref >59)
GLUCOSE: 74 mg/dL (ref 65–99)
Globulin, Total: 3.2 g/dL (ref 1.5–4.5)
Potassium: 3.6 mmol/L (ref 3.5–5.2)
Sodium: 146 mmol/L — ABNORMAL HIGH (ref 134–144)
Total Protein: 7.4 g/dL (ref 6.0–8.5)

## 2017-09-30 LAB — DRUG SCREEN 11 W/CONF, SE
AMPHETAMINES, IA: NEGATIVE ng/mL
BARBITURATES, IA: NEGATIVE ug/mL
BENZODIAZEPINES, IA: NEGATIVE ng/mL
Cocaine & Metabolite, IA: NEGATIVE ng/mL
ETHYL ALCOHOL, ENZ: NEGATIVE g/dL
METHADONE, IA: NEGATIVE ng/mL
Opiates, IA: NEGATIVE ng/mL
Oxycodones, IA: NEGATIVE ng/mL
PHENCYCLIDINE, IA: NEGATIVE ng/mL
Propoxyphene, IA: NEGATIVE ng/mL
THC(Marijuana) Metabolite, IA: NEGATIVE ng/mL

## 2017-10-01 ENCOUNTER — Encounter: Payer: Self-pay | Admitting: Family Medicine

## 2017-10-03 ENCOUNTER — Telehealth: Payer: Self-pay

## 2017-10-03 NOTE — Telephone Encounter (Signed)
Pt was called and informed of lab results and pt states that she has been out of medication for only 1 day prior to getting script in office.

## 2017-10-18 ENCOUNTER — Telehealth: Payer: Self-pay | Admitting: Family Medicine

## 2017-10-18 LAB — SPECIMEN STATUS REPORT

## 2017-10-18 NOTE — Telephone Encounter (Signed)
Pt called to get a referral for asst. leaving facility, please let it her know if you need to see her or just order the referral and FL2 form as well need to be fill

## 2017-10-21 NOTE — Telephone Encounter (Signed)
Could you please work on this for the patient?  Thank you

## 2017-10-23 NOTE — Telephone Encounter (Signed)
Call placed to the patient to discuss plans for ALF. Spoke to the patient and she then put her son, Ruth Sanchez, on the phone.  He explained that the patient's husband, his father, suddenly  passed away this weekend.  Condolences offered and instructed Ruth Sanchez to call back at a later time that was more appropriate as now did not seem like an appropriate time to talk. H e was very appreciative of the help and stated that he would prefer to call back at a later time.

## 2017-10-24 LAB — SPECIMEN STATUS REPORT

## 2017-10-24 LAB — TRAMADOL: Tramadol: 6 ng/mL — ABNORMAL LOW (ref 150–800)

## 2017-11-23 ENCOUNTER — Other Ambulatory Visit: Payer: Self-pay | Admitting: Family Medicine

## 2017-11-23 DIAGNOSIS — M549 Dorsalgia, unspecified: Principal | ICD-10-CM

## 2017-11-23 DIAGNOSIS — G8929 Other chronic pain: Secondary | ICD-10-CM

## 2017-11-29 ENCOUNTER — Telehealth: Payer: Self-pay

## 2017-11-29 NOTE — Telephone Encounter (Signed)
Patient called to cancel appointment due to husband past away. R/S 2weeks out. Per 2/8 phone messages.Ruth Sanchez

## 2017-12-02 ENCOUNTER — Telehealth: Payer: Self-pay | Admitting: Family Medicine

## 2017-12-02 ENCOUNTER — Other Ambulatory Visit: Payer: Medicare Other

## 2017-12-02 NOTE — Telephone Encounter (Signed)
Patient called to Inform her PCP that Wonda Horner has passed away.

## 2017-12-02 NOTE — Telephone Encounter (Signed)
Thanks for letting me know!

## 2017-12-04 ENCOUNTER — Ambulatory Visit: Payer: Medicare Other | Admitting: Oncology

## 2017-12-16 ENCOUNTER — Other Ambulatory Visit: Payer: Medicare Other

## 2017-12-17 ENCOUNTER — Ambulatory Visit: Payer: Medicare Other | Attending: Family Medicine | Admitting: Family Medicine

## 2017-12-17 ENCOUNTER — Telehealth: Payer: Self-pay | Admitting: Family Medicine

## 2017-12-17 ENCOUNTER — Telehealth: Payer: Self-pay

## 2017-12-17 ENCOUNTER — Ambulatory Visit: Payer: Medicare Other | Attending: Family Medicine | Admitting: Licensed Clinical Social Worker

## 2017-12-17 VITALS — BP 160/100 | HR 93 | Temp 98.3°F | Ht 61.0 in | Wt 155.2 lb

## 2017-12-17 DIAGNOSIS — F4321 Adjustment disorder with depressed mood: Secondary | ICD-10-CM

## 2017-12-17 DIAGNOSIS — Z79899 Other long term (current) drug therapy: Secondary | ICD-10-CM | POA: Insufficient documentation

## 2017-12-17 DIAGNOSIS — R05 Cough: Secondary | ICD-10-CM

## 2017-12-17 DIAGNOSIS — Z9221 Personal history of antineoplastic chemotherapy: Secondary | ICD-10-CM | POA: Insufficient documentation

## 2017-12-17 DIAGNOSIS — Z886 Allergy status to analgesic agent status: Secondary | ICD-10-CM | POA: Diagnosis not present

## 2017-12-17 DIAGNOSIS — I10 Essential (primary) hypertension: Secondary | ICD-10-CM | POA: Insufficient documentation

## 2017-12-17 DIAGNOSIS — N3281 Overactive bladder: Secondary | ICD-10-CM | POA: Diagnosis not present

## 2017-12-17 DIAGNOSIS — G8929 Other chronic pain: Secondary | ICD-10-CM | POA: Diagnosis not present

## 2017-12-17 DIAGNOSIS — M549 Dorsalgia, unspecified: Secondary | ICD-10-CM

## 2017-12-17 DIAGNOSIS — C9001 Multiple myeloma in remission: Secondary | ICD-10-CM | POA: Diagnosis not present

## 2017-12-17 DIAGNOSIS — J4520 Mild intermittent asthma, uncomplicated: Secondary | ICD-10-CM | POA: Diagnosis not present

## 2017-12-17 DIAGNOSIS — M545 Low back pain: Secondary | ICD-10-CM | POA: Insufficient documentation

## 2017-12-17 DIAGNOSIS — R059 Cough, unspecified: Secondary | ICD-10-CM

## 2017-12-17 DIAGNOSIS — Z888 Allergy status to other drugs, medicaments and biological substances status: Secondary | ICD-10-CM | POA: Diagnosis not present

## 2017-12-17 MED ORDER — CETIRIZINE HCL 10 MG PO TABS: 10.0000 mg | ORAL_TABLET | Freq: Every day | ORAL | 1 refills | Status: DC

## 2017-12-17 MED ORDER — OXYBUTYNIN CHLORIDE 5 MG PO TABS
5.0000 mg | ORAL_TABLET | Freq: Two times a day (BID) | ORAL | 3 refills | Status: DC
Start: 2017-12-17 — End: 2018-03-14

## 2017-12-17 MED ORDER — BENZONATATE 100 MG PO CAPS: ORAL_CAPSULE | ORAL | 0 refills | Status: DC

## 2017-12-17 MED ORDER — ALBUTEROL SULFATE HFA 108 (90 BASE) MCG/ACT IN AERS: 2.0000 | INHALATION_SPRAY | RESPIRATORY_TRACT | 3 refills | Status: DC | PRN

## 2017-12-17 MED ORDER — TRAMADOL HCL 50 MG PO TABS: 50.0000 mg | ORAL_TABLET | Freq: Two times a day (BID) | ORAL | 2 refills | Status: DC | PRN

## 2017-12-17 MED ORDER — CARVEDILOL 6.25 MG PO TABS: 6.2600 mg | ORAL_TABLET | Freq: Two times a day (BID) | ORAL | 3 refills | Status: DC

## 2017-12-17 MED ORDER — MOMETASONE FURO-FORMOTEROL FUM 100-5 MCG/ACT IN AERO: 2.0000 | INHALATION_SPRAY | Freq: Two times a day (BID) | RESPIRATORY_TRACT | 3 refills | Status: DC

## 2017-12-17 MED ORDER — TIZANIDINE HCL 4 MG PO TABS: 4.0000 mg | ORAL_TABLET | Freq: Three times a day (TID) | ORAL | 3 refills | Status: DC | PRN

## 2017-12-17 MED ORDER — LOSARTAN POTASSIUM 100 MG PO TABS: 100.0000 mg | ORAL_TABLET | Freq: Every day | ORAL | 3 refills | Status: DC

## 2017-12-17 NOTE — Telephone Encounter (Signed)
Dulera inhaler update needed please fu with pharmacy  mometasone-formoterol [facility]) 100-5 MCG/ACT AERO [[id number]]  [facility] [address].

## 2017-12-17 NOTE — Progress Notes (Signed)
Pt states Symbicort was too expensive to pick up. She has a Medicare A/B plan but no part D coverage. I informed her that we could offer her patient assistance for the Symbicort if needed.

## 2017-12-17 NOTE — BH Specialist Note (Signed)
Integrated Behavioral Health Initial Visit  MRN: [id number] Name: Ruth Sanchez  Number of [facility] Clinician visits:: 1/6 Session Start time: 2:30 PM  Session End time: 3:00 PM Total time: 30 minutes  Type of Service: [place] Interpretor:No. Interpretor Name and Language: N/A   Warm Hand Off Completed.       SUBJECTIVE: Ruth Sanchez is a 72 y.o. female accompanied by adult son, Geneen Dieter Patient was referred by Dr. Margarita Rana for grief support. Patient reports the following symptoms/concerns: Pt shared that her spouse of over 104 years passed away approx two months ago. She is concerned about residing alone and being taking advantage of financially by relatives Duration of problem: 2 months; Severity of problem: mild  OBJECTIVE: Mood: Pleasant and Affect: Tearful Risk of harm to self or others: No plan to harm self or others  LIFE CONTEXT: Family and Social: Pt currently resides with adult son, daughter in law, and their two minor children.  School/Work: Pt has income. Adult son is financial POA Self-Care: Pt reports staying busy around the home (cleaning), exercising, and spending time with family on the weekends. Pt's son and daughter in law assist pt with managing medication Life Changes: Pt is currently grieving the loss of spouse of over 58 years.   GOALS ADDRESSED: Patient will: 1. Reduce symptoms of: anxiety and depression 2. Increase knowledge and/or ability of: coping skills  3. Demonstrate ability to: Increase healthy adjustment to current life circumstances and Begin healthy grieving over loss  INTERVENTIONS: Interventions utilized: Supportive Counseling, Psychoeducation and/or Health Education and Link to [employer]  Standardized Assessments completed: GAD-7 and PHQ 2&9  ASSESSMENT: Patient was accompanied by adult son, Demond Dillavou. RN CM, Eden Lathe, was present during  visit. Pt shared that her spouse of over fifty years passed away approx two months ago resulting in feelings of sadness and worry. She is concerned about residing alone and being taking advantage of financially by relatives.    Patient may benefit from psychotherapy to assist in processing grief. [place] educated pt on the stages of grief. Pt's concerns regarding living independently and possibility of financial exploitation were validated and encouragement was provided. Pt's son shared that his family is relocating to [place], [place] in approx one month. Currently, it is undecided whether pt will relocate with family; however, LCSWA and RN CM strongly encouraged family to discuss all options. Pt at this time will like to continue residing with adult son. LCSWA provided information on Advanced Directives and informed family on local grief support services.   PLAN: 1. Follow up with behavioral health clinician on : Pt was encouraged to contact LCSWA if symptoms worsen or fail to improve to schedule behavioral appointments at [facility] 2. Behavioral recommendations: LCSWA recommends that pt apply healthy coping skills discussed and utilize provided resources. Pt is encouraged to schedule follow up appointment with LCSWA 3. Referral(s): Integrated Orthoptist (In Clinic) and [employer] Resources:  Grief support services 4. "From scale of 1-10, how likely are you to follow plan?": 10/10  Rebekah Chesterfield, LCSW 12/19/17 9:07 AM

## 2017-12-17 NOTE — Patient Instructions (Signed)

## 2017-12-17 NOTE — Progress Notes (Signed)
Subjective:  Patient ID: Ruth Sanchez, female    DOB: 10-28-1945  Age: 72 y.o. MRN: [id number]  CC: Hypertension and Cough   HPI Ruth Sanchez is a 72 year old female with a history of Multiple Myeloma (status post chemotherapy, status post stem cell transplant in remission managed by oncology and currently on Zometa injections every 3 months), chronic low back pain, Hypertension, intermittent Asthma here for a follow up visit accompanied by her son.- - Demond Acre. Both the patient and son have the [place] paperwork which they would like scanned into the chart and are requesting options for placement in a skilled nursing facility. She lost her husband approximately 1 month ago but states she is doing well-she declines grief counseling  Her blood pressure is elevated despite compliance with her antihypertensive.  She continues to experience low back pain and is requesting a refill of tramadol; states that tizanidine helps her as well with her pain.  She complains of a cough which feels like a tickle sensation in her throat.  I had placed her on Symbicort at her last visit for control of asthma which she was unable to obtain due to high co-pay but she continues to use her albuterol MDI.  Denies wheezing or shortness of breath.  Past Medical History:  Diagnosis Date  . Arthritis   . Asthma   . Cancer ([place])   . Hypertension     Past Surgical History:  Procedure Laterality Date  . APPENDECTOMY    . CHOLECYSTECTOMY    . ESOPHAGOGASTRODUODENOSCOPY N/A 11/24/2015   Procedure: ESOPHAGOGASTRODUODENOSCOPY (EGD);  Surgeon: Clarene Essex, MD;  Location: [facility];  Service: Endoscopy;  Laterality: N/A;  . TUBAL LIGATION      Allergies  Allergen Reactions  . Gabapentin Swelling and Other (See Comments)    Reaction:  Lip swelling  . Aspirin Itching and Rash     Outpatient Medications Prior to Visit  Medication Sig Dispense Refill  . albuterol (PROVENTIL  HFA;VENTOLIN HFA) 108 (90 Base) MCG/ACT inhaler Inhale 2 puffs into the lungs every 4 (four) hours as needed for wheezing or shortness of breath. 1 Inhaler 3  . budesonide-formoterol (SYMBICORT) 160-4.5 MCG/ACT inhaler Inhale 2 puffs into the lungs 2 (two) times daily. 1 Inhaler 3  . metoprolol tartrate (LOPRESSOR) 25 MG tablet Take 1 tablet (25 mg total) by mouth 2 (two) times daily. 60 tablet 3  . tiZANidine (ZANAFLEX) 4 MG tablet Take 1 tablet (4 mg total) by mouth every 8 (eight) hours as needed for muscle spasms. 90 tablet 3  . traMADol (ULTRAM) 50 MG tablet Take 1 tablet (50 mg total) by mouth every 12 (twelve) hours as needed. 60 tablet 2  . diphenoxylate-atropine (LOMOTIL) 2.5-0.025 MG tablet TAKE 1 TABLET BY MOUTH 3 TIMES DAILY AS NEEDED FOR DIARRHEA OR loose stools (Patient not taking: Reported on 12/17/2017) 40 tablet 1  . benzonatate (TESSALON) 100 MG capsule TAKE 1 to 2 CAPSULES BY MOUTH EVERY 8 HOURS AS NEEDED FOR COUGH  0  . losartan (COZAAR) 100 MG tablet Take 1 tablet (100 mg total) by mouth daily. 30 tablet 3  . naproxen (NAPROSYN) 500 MG tablet Take 1 tablet (500 mg total) by mouth 2 (two) times daily with a meal. (Patient not taking: Reported on 09/27/2017) 60 tablet 2  . potassium chloride (KLOR-CON) 20 MEQ packet Take 20 mEq by mouth daily. (Patient not taking: Reported on 03/29/2017) 30 packet 2  . predniSONE (DELTASONE) 20 MG  tablet Take 1 tablet (20 mg total) by mouth 2 (two) times daily. (Patient not taking: Reported on 06/28/2017) 10 tablet 0   No facility-administered medications prior to visit.     ROS Review of Systems  Constitutional: Negative for activity change, appetite change and fatigue.  HENT: Negative for congestion, sinus pressure and sore throat.   Eyes: Negative for visual disturbance.  Respiratory: Positive for cough. Negative for chest tightness, shortness of breath and wheezing.   Cardiovascular: Negative for chest pain and palpitations.  Gastrointestinal:  Negative for abdominal distention, abdominal pain and constipation.  Endocrine: Negative for polydipsia.  Genitourinary: Negative for dysuria and frequency.  Musculoskeletal: Positive for back pain. Negative for arthralgias.  Skin: Negative for rash.  Neurological: Negative for tremors, light-headedness and numbness.  Hematological: Does not bruise/bleed easily.  Psychiatric/Behavioral: Negative for agitation and behavioral problems.    Objective:  BP (!) 160/100   Pulse 93   Temp 98.3 F (36.8 C) (Oral)   Ht '5\' 1"'  (1.549 m)   Wt 155 lb 3.2 oz (70.4 kg)   SpO2 98%   BMI 29.32 kg/m   BP/Weight 12/17/2017 09/27/2017 7/41/6384  Systolic BP 536 468 032  Diastolic BP 122 482 500  Wt. (Lbs) 155.2 159 -  BMI 29.32 30.04 -      Physical Exam  Constitutional: She is oriented to person, place, and time. She appears well-developed and well-nourished.  Cardiovascular: Normal rate, normal heart sounds and intact distal pulses.  No murmur heard. Pulmonary/Chest: Effort normal and breath sounds normal. She has no wheezes. She has no rales. She exhibits no tenderness.  Abdominal: Soft. Bowel sounds are normal. She exhibits no distension and no mass. There is no tenderness.  Musculoskeletal: She exhibits tenderness (TTP of lumbar spine and on ROM).  Neurological: She is alert and oriented to person, place, and time.  Skin: Skin is warm and dry.  Psychiatric: She has a normal mood and affect.     Assessment & Plan:   1. Multiple myeloma in remission ([place]) Status post chemotherapy and stem cell transplant Continue Zometa Follow-up with oncology LCSW and case manager called in to discuss options for skilled nursing placement and to sign paperwork for [place] she would like her son - Demond Dible - traMADol (ULTRAM) 50 MG tablet; Take 1 tablet (50 mg total) by mouth every 12 (twelve) hours as needed.  Dispense: 60 tablet; Refill: 2  2. Chronic midline back pain,  unspecified back location Secondary to multiple myeloma Continue with back brace - traMADol (ULTRAM) 50 MG tablet; Take 1 tablet (50 mg total) by mouth every 12 (twelve) hours as needed.  Dispense: 60 tablet; Refill: 2 - tiZANidine (ZANAFLEX) 4 MG tablet; Take 1 tablet (4 mg total) by mouth every 8 (eight) hours as needed for muscle spasms.  Dispense: 90 tablet; Refill: 3 - Tramadol, urine - Drug Screen 12+Alcohol+CRT, Ur  3. Mild intermittent chronic asthma without complication Uncontrolled She was never able to pick up Symbicort due to cost; I have switched this to [facility] which should be available from the pharmacy on site via the patient assistance program - mometasone-formoterol (DULERA) 100-5 MCG/ACT AERO; Inhale 2 puffs into the lungs 2 (two) times daily.  Dispense: 180 Inhaler; Refill: 3 - benzonatate (TESSALON) 100 MG capsule; TAKE 1 to 2 CAPSULES BY MOUTH EVERY 8 HOURS AS NEEDED FOR COUGH  Dispense: 20 capsule; Refill: 0 - albuterol (PROVENTIL HFA;VENTOLIN HFA) 108 (90 Base) MCG/ACT inhaler; Inhale 2 puffs  into the lungs every 4 (four) hours as needed for wheezing or shortness of breath.  Dispense: 1 Inhaler; Refill: 3  4. Essential hypertension Uncontrolled Switch metoprolol to carvedilol Low-sodium diet - losartan (COZAAR) 100 MG tablet; Take 1 tablet (100 mg total) by mouth daily.  Dispense: 30 tablet; Refill: 3  5. Overactive bladder Commence oxybutynin-discussed side effects - oxybutynin (DITROPAN) 5 MG tablet; Take 1 tablet (5 mg total) by mouth 2 (two) times daily.  Dispense: 60 tablet; Refill: 3  6. Cough - cetirizine (ZYRTEC) 10 MG tablet; Take 1 tablet (10 mg total) by mouth daily.  Dispense: 30 tablet; Refill: 1   Meds ordered this encounter  Medications  . oxybutynin (DITROPAN) 5 MG tablet    Sig: Take 1 tablet (5 mg total) by mouth 2 (two) times daily.    Dispense:  60 tablet    Refill:  3  . mometasone-formoterol (DULERA) 100-5 MCG/ACT AERO    Sig: Inhale 2  puffs into the lungs 2 (two) times daily.    Dispense:  180 Inhaler    Refill:  3  . benzonatate (TESSALON) 100 MG capsule    Sig: TAKE 1 to 2 CAPSULES BY MOUTH EVERY 8 HOURS AS NEEDED FOR COUGH    Dispense:  20 capsule    Refill:  0  . traMADol (ULTRAM) 50 MG tablet    Sig: Take 1 tablet (50 mg total) by mouth every 12 (twelve) hours as needed.    Dispense:  60 tablet    Refill:  2  . tiZANidine (ZANAFLEX) 4 MG tablet    Sig: Take 1 tablet (4 mg total) by mouth every 8 (eight) hours as needed for muscle spasms.    Dispense:  90 tablet    Refill:  3  . albuterol (PROVENTIL HFA;VENTOLIN HFA) 108 (90 Base) MCG/ACT inhaler    Sig: Inhale 2 puffs into the lungs every 4 (four) hours as needed for wheezing or shortness of breath.    Dispense:  1 Inhaler    Refill:  3  . losartan (COZAAR) 100 MG tablet    Sig: Take 1 tablet (100 mg total) by mouth daily.    Dispense:  30 tablet    Refill:  3    Discontinue previous dose  . carvedilol (COREG) 6.25 MG tablet    Sig: Take 1 tablet (6.26 mg total) by mouth 2 (two) times daily with a meal.    Dispense:  60 tablet    Refill:  3    discontinue metoprolol  . cetirizine (ZYRTEC) 10 MG tablet    Sig: Take 1 tablet (10 mg total) by mouth daily.    Dispense:  30 tablet    Refill:  1    Follow-up: Return in about 3 months (around 03/16/2018) for follow up on chronic medical conditions.   [name] MD

## 2017-12-17 NOTE — Telephone Encounter (Signed)
Met with Ruth Sanchez and her son, Ruth Sanchez, when she was in Ruth office today for her appointment with Ruth Sanchez. Ruth See, LCSW/CHWC was also present during Ruth meeting.   Ruth Sanchez provided a copy of Ruth Sanchez's financial POA that will be scanned into her medical record.   Ruth Sanchez is currently living with Ruth Sanchez, his wife and his 2 sons.  Her husband passed away about 2 months ago and she is trying to cope with his loss. She was teary at times when speaking about him.  She had known her husband since she was 7 yo  and they married at 72 yo. She said of all family members, Ruth Sanchez has provided Ruth most support for her. However, Ruth Sanchez and his family will be re-locating to [place]. Ruth Sanchez had questions about having his mother move into an ALF v. Independent apartment v. Continue to live with him. Ruth Sanchez at this time would like to stay with Ruth Sanchez. She explained that as her husband was dying, he told her to stay with Ruth Sanchez. She said she doesn't want to be alone and she enjoys her grandsons. Ruth Sanchez said they have also discussed  - should she stay in [place] or relocate to [place]. Ruth Sanchez said that there was not anyone in [place] that she would feel comfortable with. She spoke about not wanting anyone to take advantage of her financially. Ruth Sanchez explained to her that he would be able to monitor her finances so that would not happen.  Ruth Sanchez inquired about an FL2. This CM explained that when/if they find a facility that requests and FL2 for Ruth Sanchez he can contact this CM.  At this time, he said they will further discuss living arrangements for his mother but at present time, his mother would prefer to stay with Ruth Sanchez and his family.

## 2017-12-18 ENCOUNTER — Encounter: Payer: Self-pay | Admitting: Family Medicine

## 2017-12-18 ENCOUNTER — Ambulatory Visit: Payer: Medicare Other | Admitting: Oncology

## 2017-12-19 ENCOUNTER — Telehealth: Payer: Self-pay | Admitting: Oncology

## 2017-12-19 NOTE — Telephone Encounter (Signed)
Returned call to patients son Ayauna Mcnay in order to reschedule appointments that were missed per 2/28 phone message

## 2017-12-20 LAB — DRUG SCREEN 12+ALCOHOL+CRT, UR
Amphetamines, Urine: NEGATIVE ng/mL
BENZODIAZ UR QL: NEGATIVE ng/mL
Barbiturate: NEGATIVE ng/mL
CREATININE, RANDOM U: 12.6 mg/dL — AB (ref 20.0–300.0)
Cannabinoids: NEGATIVE ng/mL
Cocaine (Metabolite): NEGATIVE ng/mL
Ethanol, Urine: NEGATIVE %
MEPERIDINE: NEGATIVE ng/mL
METHADONE: NEGATIVE ng/mL
OPIATE SCREEN URINE: NEGATIVE ng/mL
OXYCODONE+OXYMORPHONE UR QL SCN: NEGATIVE ng/mL
PHENCYCLIDINE: NEGATIVE ng/mL
PROPOXYPHENE: NEGATIVE ng/mL
SPECIFIC GRAVITY: 1.005
TRAMADOL: POSITIVE — AB

## 2017-12-20 LAB — TRAMADOL, URINE

## 2017-12-23 ENCOUNTER — Telehealth: Payer: Self-pay | Admitting: Family Medicine

## 2017-12-23 ENCOUNTER — Inpatient Hospital Stay: Payer: Medicare Other | Attending: Internal Medicine

## 2017-12-23 DIAGNOSIS — C9001 Multiple myeloma in remission: Secondary | ICD-10-CM | POA: Diagnosis not present

## 2017-12-23 DIAGNOSIS — I10 Essential (primary) hypertension: Secondary | ICD-10-CM | POA: Insufficient documentation

## 2017-12-23 DIAGNOSIS — M549 Dorsalgia, unspecified: Secondary | ICD-10-CM

## 2017-12-23 DIAGNOSIS — G8929 Other chronic pain: Secondary | ICD-10-CM

## 2017-12-23 LAB — CBC WITH DIFFERENTIAL/PLATELET
BASOS ABS: 0 10*3/uL (ref 0.0–0.1)
Basophils Relative: 1 %
EOS PCT: 2 %
Eosinophils Absolute: 0.1 10*3/uL (ref 0.0–0.5)
HCT: 40 % (ref 34.8–46.6)
Hemoglobin: 13.1 g/dL (ref 11.6–15.9)
LYMPHS PCT: 55 %
Lymphs Abs: 2.1 10*3/uL (ref 0.9–3.3)
MCH: 29.3 pg (ref 25.1–34.0)
MCHC: 32.8 g/dL (ref 31.5–36.0)
MCV: 89.5 fL (ref 79.5–101.0)
Monocytes Absolute: 0.5 10*3/uL (ref 0.1–0.9)
Monocytes Relative: 13 %
Neutro Abs: 1.1 10*3/uL — ABNORMAL LOW (ref 1.5–6.5)
Neutrophils Relative %: 29 %
PLATELETS: 97 10*3/uL — AB (ref 145–400)
RBC: 4.47 MIL/uL (ref 3.70–5.45)
RDW: 13.4 % (ref 11.2–14.5)
WBC: 3.8 10*3/uL — ABNORMAL LOW (ref 3.9–10.3)

## 2017-12-23 LAB — COMPREHENSIVE METABOLIC PANEL
ALBUMIN: 3.5 g/dL (ref 3.5–5.0)
ALT: 16 U/L (ref 0–55)
AST: 26 U/L (ref 5–34)
Alkaline Phosphatase: 80 U/L (ref 40–150)
Anion gap: 11 (ref 3–11)
BILIRUBIN TOTAL: 1 mg/dL (ref 0.2–1.2)
BUN: 12 mg/dL (ref 7–26)
CO2: 26 mmol/L (ref 22–29)
CREATININE: 1 mg/dL (ref 0.60–1.10)
Calcium: 9.1 mg/dL (ref 8.4–10.4)
Chloride: 102 mmol/L (ref 98–109)
GFR calc Af Amer: >60 mL/min (ref >60)
GFR calc non Af Amer: 55 mL/min — ABNORMAL LOW (ref >60)
GLUCOSE: 90 mg/dL (ref 70–140)
POTASSIUM: 3.4 mmol/L — AB (ref 3.5–5.1)
Sodium: 139 mmol/L (ref 136–145)
TOTAL PROTEIN: 7.4 g/dL (ref 6.4–8.3)

## 2017-12-23 NOTE — Telephone Encounter (Signed)
Daughter in law states that Coreg is making patient feel out of it. Patient want to know if medication can changed.

## 2017-12-23 NOTE — Telephone Encounter (Signed)
Please ensure she  is using all her inhalers (Dulera and proventil), taking Tessalon Perles and Zyrtec for cough.  She will need an office visit.

## 2017-12-23 NOTE — Telephone Encounter (Signed)
Pt daughter in-law called since she notice that she is taking new medication and she is having dizziness and up-set stomach, an talking by herself, she need advice and the cough medication is not working, she want to call the pt to given them advice also she is not eating too much she want to talk to the pcp and she is coming with the PT to fill out a DPR to get auth to talk about her

## 2017-12-24 LAB — KAPPA/LAMBDA LIGHT CHAINS
Kappa free light chain: 63 mg/L — ABNORMAL HIGH (ref 3.3–19.4)
Kappa, lambda light chain ratio: 1.28 (ref 0.26–1.65)
Lambda free light chains: 49.3 mg/L — ABNORMAL HIGH (ref 5.7–26.3)

## 2017-12-24 LAB — IGG, IGA, IGM
IGM (IMMUNOGLOBULIN M), SRM: 216 mg/dL (ref 26–217)
IgA: 269 mg/dL (ref 64–422)
IgG (Immunoglobin G), Serum: 1749 mg/dL — ABNORMAL HIGH (ref 700–1600)

## 2017-12-24 LAB — BETA 2 MICROGLOBULIN, SERUM: Beta-2 Microglobulin: 3.7 mg/L — ABNORMAL HIGH (ref 0.6–2.4)

## 2017-12-24 LAB — LACTATE DEHYDROGENASE: LDH: 234 U/L (ref 125–245)

## 2017-12-27 ENCOUNTER — Ambulatory Visit: Payer: Medicare Other | Admitting: Oncology

## 2017-12-27 ENCOUNTER — Ambulatory Visit: Payer: Medicare Other | Admitting: Family Medicine

## 2017-12-31 ENCOUNTER — Other Ambulatory Visit: Payer: Self-pay | Admitting: Family Medicine

## 2017-12-31 ENCOUNTER — Ambulatory Visit: Payer: Medicare Other | Admitting: Internal Medicine

## 2017-12-31 DIAGNOSIS — I10 Essential (primary) hypertension: Secondary | ICD-10-CM

## 2018-01-01 ENCOUNTER — Ambulatory Visit: Payer: Medicare Other | Attending: Family Medicine | Admitting: Physician Assistant

## 2018-01-01 VITALS — BP 114/80 | HR 93 | Temp 98.6°F | Resp 18 | Ht 60.0 in | Wt 154.0 lb

## 2018-01-01 DIAGNOSIS — T887XXA Unspecified adverse effect of drug or medicament, initial encounter: Secondary | ICD-10-CM | POA: Diagnosis not present

## 2018-01-01 DIAGNOSIS — Z886 Allergy status to analgesic agent status: Secondary | ICD-10-CM | POA: Diagnosis not present

## 2018-01-01 DIAGNOSIS — Z888 Allergy status to other drugs, medicaments and biological substances status: Secondary | ICD-10-CM | POA: Diagnosis not present

## 2018-01-01 DIAGNOSIS — R05 Cough: Secondary | ICD-10-CM | POA: Insufficient documentation

## 2018-01-01 DIAGNOSIS — Z79899 Other long term (current) drug therapy: Secondary | ICD-10-CM | POA: Insufficient documentation

## 2018-01-01 DIAGNOSIS — I10 Essential (primary) hypertension: Secondary | ICD-10-CM

## 2018-01-01 DIAGNOSIS — M549 Dorsalgia, unspecified: Secondary | ICD-10-CM | POA: Diagnosis not present

## 2018-01-01 DIAGNOSIS — J40 Bronchitis, not specified as acute or chronic: Secondary | ICD-10-CM | POA: Diagnosis not present

## 2018-01-01 DIAGNOSIS — T50905A Adverse effect of unspecified drugs, medicaments and biological substances, initial encounter: Secondary | ICD-10-CM | POA: Insufficient documentation

## 2018-01-01 DIAGNOSIS — G8929 Other chronic pain: Secondary | ICD-10-CM | POA: Diagnosis not present

## 2018-01-01 MED ORDER — AZITHROMYCIN 250 MG PO TABS: ORAL_TABLET | ORAL | 0 refills | Status: DC

## 2018-01-01 MED ORDER — TIZANIDINE HCL 4 MG PO TABS: 4.0000 mg | ORAL_TABLET | Freq: Every day | ORAL | 3 refills | Status: DC

## 2018-01-01 NOTE — Progress Notes (Signed)
Patient ID: Ruth Sanchez, female   DOB: Jun 15, 1946, 72 y.o.   MRN: [id number]    Ruth Sanchez, is a 72 y.o. female  NFA:[id number]  ION:[id number]  DOB - [birth year]  Subjective:  Chief Complaint and HPI: Ruth Sanchez is a 72 y.o. female here today with medication concerns and continued cough.  No f/c.  Mucus is yellow at times.  Cough has been happening for about 3 weeks.  Not better with Tessalon perles.    Also concern in that sometimes she is becoming forgetful after taking all of her new medications.  She has taken tramadol for years and not had any problems.  Newly started on carvedilol, zanaflex, and oxybutynin.  Forgetfulness/memory issues usu happen about 1 hour after taking zanaflex.  She has been taking this 3 X daily  ROS:   Constitutional:  No f/c, No night sweats, No unexplained weight loss. EENT:  No vision changes, No blurry vision, No hearing changes. No mouth, throat, or ear problems.  Respiratory: + cough, No SOB Cardiac: No CP, no palpitations GI:  No abd pain, No N/V/D. GU: No Urinary s/sx Musculoskeletal: No joint pain Neuro: No headache, no dizziness, no motor weakness.  Skin: No rash Endocrine:  No polydipsia. No polyuria.  Psych: Denies SI/HI  No problems updated.  ALLERGIES: Allergies  Allergen Reactions  . Gabapentin Swelling and Other (See Comments)    Reaction:  Lip swelling  . Aspirin Itching and Rash    PAST MEDICAL HISTORY: Past Medical History:  Diagnosis Date  . Arthritis   . Asthma   . Cancer ([place])   . Hypertension     MEDICATIONS AT HOME: Prior to Admission medications   Medication Sig Start Date End Date Taking? Authorizing Provider  albuterol (PROVENTIL HFA;VENTOLIN HFA) 108 (90 Base) MCG/ACT inhaler Inhale 2 puffs into the lungs every 4 (four) hours as needed for wheezing or shortness of breath. 12/17/17  Yes Ruth Rakes, MD  carvedilol (COREG) 6.25 MG tablet Take 1 tablet (6.26 mg total) by mouth 2 (two) times daily with a  meal. 12/17/17  Yes Newlin, Enobong, MD  cetirizine (ZYRTEC) 10 MG tablet Take 1 tablet (10 mg total) by mouth daily. 12/17/17  Yes Ruth Rakes, MD  losartan (COZAAR) 100 MG tablet Take 1 tablet (100 mg total) by mouth daily. 12/17/17 01/16/18 Yes Newlin, Charlane Ferretti, MD  mometasone-formoterol (DULERA) 100-5 MCG/ACT AERO Inhale 2 puffs into the lungs 2 (two) times daily. 12/17/17  Yes Ruth Rakes, MD  oxybutynin (DITROPAN) 5 MG tablet Take 1 tablet (5 mg total) by mouth 2 (two) times daily. 12/17/17  Yes Ruth Rakes, MD  tiZANidine (ZANAFLEX) 4 MG tablet Take 1 tablet (4 mg total) by mouth at bedtime. 01/01/18  Yes Ineta Sinning, Dionne Bucy, PA-C  traMADol (ULTRAM) 50 MG tablet Take 1 tablet (50 mg total) by mouth every 12 (twelve) hours as needed. 12/17/17  Yes Ruth Rakes, MD  azithromycin (ZITHROMAX) 250 MG tablet Take 2 today then 1 daily 01/01/18   Thereasa Solo, Dionne Bucy, PA-C  diphenoxylate-atropine (LOMOTIL) 2.5-0.025 MG tablet TAKE 1 TABLET BY MOUTH 3 TIMES DAILY AS NEEDED FOR DIARRHEA OR loose stools Patient not taking: Reported on 12/17/2017 03/29/17   Ruth Rakes, MD     Objective:  EXAM:   Vitals:   01/01/18 1523  BP: 114/80  Pulse: 93  Resp: 18  Temp: 98.6 F (37 C)  TempSrc: Oral  SpO2: 98%  Weight: 154 lb (69.9 kg)  Height: 5' (1.524 m)  General appearance : A&OX3. NAD. Non-toxic-appearing HEENT: Atraumatic and Normocephalic.  PERRLA. EOM intact.   Neck: supple, no JVD. No cervical lymphadenopathy. No thyromegaly Chest/Lungs:  Breathing-non-labored, Good air entry bilaterally, breath sounds normal without rales, rhonchi, or wheezing  CVS: S1 S2 regular, no murmurs, gallops, rubs  Abdomen: Bowel sounds present, Non tender and not distended with no gaurding, rigidity or rebound. Extremities: Bilateral Lower Ext shows no edema, both legs are warm to touch with = pulse throughout Neurology:  CN II-XII grossly intact, Non focal.  Lucid and appropriate recall in office.     Psych:  TP linear. J/I WNL. Normal speech. Appropriate eye contact and affect.  Skin:  No Rash  Data Review No results found for: HGBA1C   Assessment & Plan   1. Medication side effect Switch zanaflex to nighttime only (see #3).  After careful review of medications, zanaflex would be most likely to be causing the problems she is having.  She can try taking 1/2 tab and taking only at night.  Her son is with her and manages her medications and agrees with the plan.    2. Bronchitis Cover for atypicals bc cough present >3 weeks - azithromycin (ZITHROMAX) 250 MG tablet; Take 2 today then 1 daily  Dispense: 6 tablet; Refill: 0  3. Chronic midline back pain, unspecified back location - tiZANidine (ZANAFLEX) 4 MG tablet; Take 1 tablet (4 mg total) by mouth at bedtime.  Dispense: 30 tablet; Refill: 3  4. Essential hypertension Controlled.  Continue current regimen.    Patient have been counseled extensively about nutrition and exercise  Return for keep f/up with Dr Margarita Rana.  The patient was given clear instructions to go to ER or return to medical center if symptoms don't improve, worsen or new problems develop. The patient verbalized understanding. The patient was told to call to get lab results if they haven't heard anything in the next week.    Freeman Caldron, PA-C [facility], [place]   01/01/2018, 3:31 PM

## 2018-01-07 ENCOUNTER — Inpatient Hospital Stay (HOSPITAL_BASED_OUTPATIENT_CLINIC_OR_DEPARTMENT_OTHER): Payer: Medicare Other | Admitting: Oncology

## 2018-01-07 ENCOUNTER — Encounter: Payer: Self-pay | Admitting: Oncology

## 2018-01-07 ENCOUNTER — Inpatient Hospital Stay: Payer: Medicare Other

## 2018-01-07 ENCOUNTER — Telehealth: Payer: Self-pay | Admitting: Oncology

## 2018-01-07 ENCOUNTER — Other Ambulatory Visit: Payer: Self-pay | Admitting: *Deleted

## 2018-01-07 VITALS — BP 193/94 | HR 77 | Temp 99.0°F | Resp 18 | Ht 60.0 in | Wt 152.4 lb

## 2018-01-07 DIAGNOSIS — I10 Essential (primary) hypertension: Secondary | ICD-10-CM

## 2018-01-07 DIAGNOSIS — C9001 Multiple myeloma in remission: Secondary | ICD-10-CM

## 2018-01-07 MED ORDER — SODIUM CHLORIDE 0.9 % IV SOLN
Freq: Once | INTRAVENOUS | Status: AC
  Administered 2018-01-07: 15:00:00 via INTRAVENOUS

## 2018-01-07 MED ORDER — ZOLEDRONIC ACID 4 MG/100ML IV SOLN
4.0000 mg | Freq: Once | INTRAVENOUS | Status: AC
  Administered 2018-01-07: 4 mg via INTRAVENOUS
  Filled 2018-01-07: qty 100

## 2018-01-07 NOTE — Assessment & Plan Note (Signed)
This is a very pleasant 72 year old African American female with history of multiple myeloma status post systemic chemotherapy with Revlimid and Decadron followed by this peripheral blood stem cell transplant and has been observation since June of 2009 with no significant evidence for disease progression.  The patient had a recent myeloma panel is here to discuss results.  She was seen with Dr. Julien Nordmann.  Discussed with the patient that her recent myeloma panel that showed that her protein levels have increased.  Recommend that she have another repeat myeloma panel in approximately 3 months for closer observation.  She will have repeat labs in 3 months and a follow-up visit approximately 1 week later to discuss the results.  For the bone disease from multiple myeloma, she will continue on Zometa every 3 months.  She missed her last dose in December 2018.  For hypertension, I strongly advised the patient to monitor her blood pressure closely at home and to consult with her primary care physician for adjustment of her medication.  The patient was advised to call immediately if she has any concerning symptoms in the interval. All questions were answered. The patient knows to call the clinic with any problems, questions or concerns. We can certainly see the patient much sooner if necessary.

## 2018-01-07 NOTE — Patient Instructions (Signed)

## 2018-01-07 NOTE — Progress Notes (Signed)
[facility] OFFICE PROGRESS NOTE  [name], MD [place] [address]  DIAGNOSIS: Multiple myeloma, IgG kappa subtype diagnosed in October 2008.   PRIOR THERAPY:  1. Status post radiotherapy to the lower lumbar spine and left pelvic area under the care of Dr. Sondra Come. The patient received a total dose of 3000 cGy. in 15 fractions between 10/14/2007 through 11/07/2007  2.Status post 6 cycles of systemic chemotherapy with Revlimid and Decadron. Last dose given April 2009.  3. status post autologous peripheral blood stem cell transplant on 04/01/2008 under the care of Dr. Valarie Merino at [facility].   CURRENT THERAPY: Zometa 4 mg IV given every 3 months.  INTERVAL HISTORY: SHANDELLE BORRELLI 72 y.o. female returns for a routine follow-up visit by herself.  Since her last visit, her husband has passed away.  The patient is feeling fine today and has no specific complaints except for persistent and chronic low back pain.  This is unchanged from previous reports.  The patient denies fevers and chills.  Denies chest pain, shortness of breath, cough, hemoptysis.  Denies nausea, vomiting, constipation, diarrhea.  Denies recent weight loss or night sweats.  The patient had a recent myeloma panel and is here for evaluation and discussion of her lab results.  MEDICAL HISTORY: Past Medical History:  Diagnosis Date  . Arthritis   . Asthma   . Cancer ([place])   . Hypertension     ALLERGIES:  is allergic to gabapentin and aspirin.  MEDICATIONS:  Current Outpatient Medications  Medication Sig Dispense Refill  . carvedilol (COREG) 6.25 MG tablet Take 1 tablet (6.26 mg total) by mouth 2 (two) times daily with a meal. 60 tablet 3  . cetirizine (ZYRTEC) 10 MG tablet Take 1 tablet (10 mg total) by mouth daily. 30 tablet 1  . oxybutynin (DITROPAN) 5 MG tablet Take 1 tablet (5 mg total) by mouth 2 (two) times daily. 60 tablet 3  . traMADol (ULTRAM) 50 MG tablet Take 1  tablet (50 mg total) by mouth every 12 (twelve) hours as needed. 60 tablet 2  . albuterol (PROVENTIL HFA;VENTOLIN HFA) 108 (90 Base) MCG/ACT inhaler Inhale 2 puffs into the lungs every 4 (four) hours as needed for wheezing or shortness of breath. (Patient not taking: Reported on 01/07/2018) 1 Inhaler 3  . azithromycin (ZITHROMAX) 250 MG tablet Take 2 today then 1 daily (Patient not taking: Reported on 01/07/2018) 6 tablet 0  . diphenoxylate-atropine (LOMOTIL) 2.5-0.025 MG tablet TAKE 1 TABLET BY MOUTH 3 TIMES DAILY AS NEEDED FOR DIARRHEA OR loose stools (Patient not taking: Reported on 12/17/2017) 40 tablet 1  . losartan (COZAAR) 100 MG tablet Take 1 tablet (100 mg total) by mouth daily. (Patient not taking: Reported on 01/07/2018) 30 tablet 3  . mometasone-formoterol (DULERA) 100-5 MCG/ACT AERO Inhale 2 puffs into the lungs 2 (two) times daily. (Patient not taking: Reported on 01/07/2018) 180 Inhaler 3  . tiZANidine (ZANAFLEX) 4 MG tablet Take 1 tablet (4 mg total) by mouth at bedtime. (Patient not taking: Reported on 01/07/2018) 30 tablet 3   No current facility-administered medications for this visit.    Facility-Administered Medications Ordered in Other Visits  Medication Dose Route Frequency Provider Last Rate Last Dose  . 0.9 %  sodium chloride infusion   Intravenous Once Curt Bears, MD      . Zoledronic Acid (ZOMETA) IVPB 4 mg  4 mg Intravenous Once Curt Bears, MD  SURGICAL HISTORY:  Past Surgical History:  Procedure Laterality Date  . APPENDECTOMY    . CHOLECYSTECTOMY    . ESOPHAGOGASTRODUODENOSCOPY N/A 11/24/2015   Procedure: ESOPHAGOGASTRODUODENOSCOPY (EGD);  Surgeon: Clarene Essex, MD;  Location: [facility];  Service: Endoscopy;  Laterality: N/A;  . TUBAL LIGATION      REVIEW OF SYSTEMS:   Review of Systems  Constitutional: Negative for appetite change, chills, fatigue, fever and unexpected weight change.  HENT:   Negative for mouth sores, nosebleeds, sore throat and  trouble swallowing.   Eyes: Negative for eye problems and icterus.  Respiratory: Negative for cough, hemoptysis, shortness of breath and wheezing.   Cardiovascular: Negative for chest pain and leg swelling.  Gastrointestinal: Negative for abdominal pain, constipation, diarrhea, nausea and vomiting.  Genitourinary: Negative for bladder incontinence, difficulty urinating, dysuria, frequency and hematuria.   Musculoskeletal: Negative for gait problem, neck pain and neck stiffness. Persistent, chronic low back pain. Skin: Negative for itching and rash.  Neurological: Negative for dizziness, extremity weakness, gait problem, headaches, light-headedness and seizures.  Hematological: Negative for adenopathy. Does not bruise/bleed easily.  Psychiatric/Behavioral: Negative for confusion, depression and sleep disturbance. The patient is not nervous/anxious.     PHYSICAL EXAMINATION:  Blood pressure (!) 193/94, pulse 77, temperature 99 F (37.2 C), temperature source Oral, resp. rate 18, height 5' (1.524 m), weight 152 lb 6.4 oz (69.1 kg), SpO2 98 %.  ECOG PERFORMANCE STATUS: 1 - Symptomatic but completely ambulatory  Physical Exam  Constitutional: Oriented to person, place, and time and well-developed, well-nourished, and in no distress. No distress.  HENT:  Head: Normocephalic and atraumatic.  Mouth/Throat: Oropharynx is clear and moist. No oropharyngeal exudate.  Eyes: Conjunctivae are normal. Right eye exhibits no discharge. Left eye exhibits no discharge. No scleral icterus.  Neck: Normal range of motion. Neck supple.  Cardiovascular: Normal rate, regular rhythm, normal heart sounds and intact distal pulses.   Pulmonary/Chest: Effort normal and breath sounds normal. No respiratory distress. No wheezes. No rales.  Abdominal: Soft. Bowel sounds are normal. Exhibits no distension and no mass. There is no tenderness.  Musculoskeletal: Normal range of motion. Exhibits no edema.  Lymphadenopathy:     No cervical adenopathy.  Neurological: Alert and oriented to person, place, and time. Exhibits normal muscle tone. Gait normal. Coordination normal.  Skin: Skin is warm and dry. No rash noted. Not diaphoretic. No erythema. No pallor.  Psychiatric: Mood, memory and judgment normal.  Vitals reviewed.  LABORATORY DATA: Lab Results  Component Value Date   WBC 3.8 (L) 12/23/2017   HGB 13.1 12/23/2017   HCT 40.0 12/23/2017   MCV 89.5 12/23/2017   PLT 97 (L) 12/23/2017      Chemistry      Component Value Date/Time   NA 139 12/23/2017 1615   NA 146 (H) 09/27/2017 1528   NA 143 05/28/2017 1141   K 3.4 (L) 12/23/2017 1615   K 3.5 05/28/2017 1141   CL 102 12/23/2017 1615   CL 104 01/19/2013 1058   CO2 26 12/23/2017 1615   CO2 28 05/28/2017 1141   BUN 12 12/23/2017 1615   BUN 15 09/27/2017 1528   BUN 10.4 05/28/2017 1141   CREATININE 1.00 12/23/2017 1615   CREATININE 0.8 05/28/2017 1141      Component Value Date/Time   CALCIUM 9.1 12/23/2017 1615   CALCIUM 9.7 05/28/2017 1141   ALKPHOS 80 12/23/2017 1615   ALKPHOS 122 05/28/2017 1141   AST 26 12/23/2017 1615   AST 18 05/28/2017  1141   ALT 16 12/23/2017 1615   ALT 14 05/28/2017 1141   BILITOT 1.0 12/23/2017 1615   BILITOT 0.8 09/27/2017 1528   BILITOT 0.86 05/28/2017 1141       RADIOGRAPHIC STUDIES:  No results found.   ASSESSMENT/PLAN:  Multiple myeloma in remission This is a very pleasant 72 year old African American female with history of multiple myeloma status post systemic chemotherapy with Revlimid and Decadron followed by this peripheral blood stem cell transplant and has been observation since June of 2009 with no significant evidence for disease progression.  The patient had a recent myeloma panel is here to discuss results.  She was seen with Dr. Julien Nordmann.  Discussed with the patient that her recent myeloma panel that showed that her protein levels have increased.  Recommend that she have another repeat  myeloma panel in approximately 3 months for closer observation.  She will have repeat labs in 3 months and a follow-up visit approximately 1 week later to discuss the results.  For the bone disease from multiple myeloma, she will continue on Zometa every 3 months.  She missed her last dose in December 2018.  For hypertension, I strongly advised the patient to monitor her blood pressure closely at home and to consult with her primary care physician for adjustment of her medication.  The patient was advised to call immediately if she has any concerning symptoms in the interval. All questions were answered. The patient knows to call the clinic with any problems, questions or concerns. We can certainly see the patient much sooner if necessary.  Orders Placed This Encounter  Procedures  . CBC with Differential (Cancer Center Only)    Standing Status:   Future    Standing Expiration Date:   01/08/2019  . CMP ([facility] only)    Standing Status:   Future    Standing Expiration Date:   01/08/2019  . Beta 2 microglobulin, serum    Standing Status:   Future    Standing Expiration Date:   01/07/2019  . Kappa/lambda light chains    Standing Status:   Future    Standing Expiration Date:   01/07/2019  . Lactate dehydrogenase    Standing Status:   Future    Standing Expiration Date:   01/08/2019  . IgG, IgA, IgM    Standing Status:   Future    Standing Expiration Date:   01/07/2019    Mikey Bussing, DNP, AGPCNP-BC, AOCNP 01/07/18  ADDENDUM: Hematology/Oncology Attending: I had a face-to-face encounter with the patient.  I recommended her care plan.  This is a very pleasant 72 years old African-American female with history of multiple myeloma status post induction systemic chemotherapy followed by stem cell transplant and has been in observation for several years.  The patient is feeling fine today with no specific complaints.  Unfortunately she lost her husband recently.  She had repeat myeloma  panel that showed mild increase in the free kappa light chain as well as IgG.  I discussed the lab results with the patient and recommended for her to continue in observation but will have repeat myeloma panel in 3 months for further evaluation of her disease.  For hypertension she was advised to take her blood pressure medication as prescribed and to consult with her primary care physician for adjustment of her medication. For the bone disease, she will continue on Zometa every 3 months. The patient was advised to call immediately if she has any concerning symptoms in  the interval.  Disclaimer: This note was dictated with voice recognition software. Similar sounding words can inadvertently be transcribed and may be missed upon review. Eilleen Kempf, MD 01/09/18

## 2018-01-07 NOTE — Telephone Encounter (Signed)
Scheduled appt per 3/19 los - Gave patient AVS and calender per los.  

## 2018-01-13 ENCOUNTER — Other Ambulatory Visit: Payer: Self-pay | Admitting: Family Medicine

## 2018-01-13 DIAGNOSIS — R05 Cough: Secondary | ICD-10-CM

## 2018-01-13 DIAGNOSIS — R059 Cough, unspecified: Secondary | ICD-10-CM

## 2018-02-11 ENCOUNTER — Other Ambulatory Visit: Payer: Self-pay | Admitting: Family Medicine

## 2018-02-11 DIAGNOSIS — M549 Dorsalgia, unspecified: Secondary | ICD-10-CM

## 2018-02-11 DIAGNOSIS — G8929 Other chronic pain: Secondary | ICD-10-CM

## 2018-02-11 DIAGNOSIS — C9001 Multiple myeloma in remission: Secondary | ICD-10-CM

## 2018-02-11 NOTE — Telephone Encounter (Signed)
Please advise on refill request

## 2018-03-14 ENCOUNTER — Other Ambulatory Visit: Payer: Self-pay | Admitting: Family Medicine

## 2018-03-14 DIAGNOSIS — N3281 Overactive bladder: Secondary | ICD-10-CM

## 2018-03-24 ENCOUNTER — Encounter: Payer: Self-pay | Admitting: Family Medicine

## 2018-03-24 ENCOUNTER — Ambulatory Visit: Payer: Medicare Other | Attending: Family Medicine | Admitting: Family Medicine

## 2018-03-24 DIAGNOSIS — G8929 Other chronic pain: Secondary | ICD-10-CM | POA: Diagnosis not present

## 2018-03-24 DIAGNOSIS — M549 Dorsalgia, unspecified: Secondary | ICD-10-CM | POA: Diagnosis not present

## 2018-03-24 DIAGNOSIS — M1990 Unspecified osteoarthritis, unspecified site: Secondary | ICD-10-CM | POA: Diagnosis not present

## 2018-03-24 DIAGNOSIS — C9001 Multiple myeloma in remission: Secondary | ICD-10-CM

## 2018-03-24 DIAGNOSIS — J4520 Mild intermittent asthma, uncomplicated: Secondary | ICD-10-CM | POA: Diagnosis not present

## 2018-03-24 DIAGNOSIS — M545 Low back pain: Secondary | ICD-10-CM | POA: Diagnosis not present

## 2018-03-24 DIAGNOSIS — Z9221 Personal history of antineoplastic chemotherapy: Secondary | ICD-10-CM | POA: Insufficient documentation

## 2018-03-24 DIAGNOSIS — I10 Essential (primary) hypertension: Secondary | ICD-10-CM | POA: Diagnosis not present

## 2018-03-24 DIAGNOSIS — R059 Cough, unspecified: Secondary | ICD-10-CM

## 2018-03-24 DIAGNOSIS — N3281 Overactive bladder: Secondary | ICD-10-CM | POA: Insufficient documentation

## 2018-03-24 DIAGNOSIS — R05 Cough: Secondary | ICD-10-CM | POA: Insufficient documentation

## 2018-03-24 DIAGNOSIS — Z79899 Other long term (current) drug therapy: Secondary | ICD-10-CM | POA: Insufficient documentation

## 2018-03-24 MED ORDER — CETIRIZINE HCL 10 MG PO TABS: 10.0000 mg | ORAL_TABLET | Freq: Every day | ORAL | 1 refills | Status: DC

## 2018-03-24 MED ORDER — TRAMADOL HCL 50 MG PO TABS: 50.0000 mg | ORAL_TABLET | Freq: Two times a day (BID) | ORAL | 2 refills | Status: DC | PRN

## 2018-03-24 MED ORDER — OXYBUTYNIN CHLORIDE 5 MG PO TABS: 5.0000 mg | ORAL_TABLET | Freq: Two times a day (BID) | ORAL | 6 refills | Status: DC

## 2018-03-24 MED ORDER — TIZANIDINE HCL 4 MG PO TABS: 4.0000 mg | ORAL_TABLET | Freq: Every day | ORAL | 3 refills | Status: DC

## 2018-03-24 MED ORDER — CARVEDILOL 12.5 MG PO TABS: 12.5000 mg | ORAL_TABLET | Freq: Two times a day (BID) | ORAL | 6 refills | Status: DC

## 2018-03-24 MED ORDER — LOSARTAN POTASSIUM 100 MG PO TABS: 100.0000 mg | ORAL_TABLET | Freq: Every day | ORAL | 6 refills | Status: DC

## 2018-03-24 NOTE — Patient Instructions (Signed)

## 2018-03-25 NOTE — Progress Notes (Signed)
Subjective:  Patient ID: Ruth Sanchez, female    DOB: 1946-03-07  Age: 72 y.o. MRN: [id number]  CC: Hypertension  HPI MYLI PAE is a 72 year old female with a history of Multiple Myeloma (status post chemotherapy, status post stem cell transplant in remission managed by oncology and currently on Zometa injections every 3 months), chronic low back pain, Hypertension, intermittent Asthma here for a follow up visit accompanied by her son.- - Demond Bair. They have signed the Durable power of Attorney and would like [place] to have a copy.  Her blood pressure is elevated and she endorses compliance with her antihypertensive. Her son states at home systolic BPs have been in the 150s - 160s with only a single episode where it was 120. Her Asthma has been controlled on her rescue inhaler and she never received Dulera which was supposed to be obtained via the patient assistance program. Her overactive bladder symptoms have minimized on Oxybutynin. For her low back pain she remains on Tramadol and Trazodone and has to use Tylenol intermittently due to worsening pain. Her tailbone has hurt for the last few weeks and she denies trauma. At her last visit with the PA the dose of Tramadol was decreased to a night time dosing due to complaints of memory issues commencing shortly after taking Tizanidine,Oxybutynin and Carvedilol as symptoms were thought to be secondary to her muscle relaxant and she feels better now.  She sees her Oncologist, Dr Mora Appl next week.  Past Medical History:  Diagnosis Date  . Arthritis   . Asthma   . Cancer ([place])   . Hypertension     Past Surgical History:  Procedure Laterality Date  . APPENDECTOMY    . CHOLECYSTECTOMY    . ESOPHAGOGASTRODUODENOSCOPY N/A 11/24/2015   Procedure: ESOPHAGOGASTRODUODENOSCOPY (EGD);  Surgeon: Clarene Essex, MD;  Location: [facility];  Service: Endoscopy;  Laterality: N/A;  . TUBAL LIGATION      Allergies  Allergen Reactions  .  Gabapentin Swelling and Other (See Comments)    Reaction:  Lip swelling  . Aspirin Itching and Rash     Outpatient Medications Prior to Visit  Medication Sig Dispense Refill  . albuterol (PROVENTIL HFA;VENTOLIN HFA) 108 (90 Base) MCG/ACT inhaler Inhale 2 puffs into the lungs every 4 (four) hours as needed for wheezing or shortness of breath. (Patient not taking: Reported on 01/07/2018) 1 Inhaler 3  . diphenoxylate-atropine (LOMOTIL) 2.5-0.025 MG tablet TAKE 1 TABLET BY MOUTH 3 TIMES DAILY AS NEEDED FOR DIARRHEA OR loose stools (Patient not taking: Reported on 12/17/2017) 40 tablet 1  . mometasone-formoterol (DULERA) 100-5 MCG/ACT AERO Inhale 2 puffs into the lungs 2 (two) times daily. (Patient not taking: Reported on 01/07/2018) 180 Inhaler 3  . azithromycin (ZITHROMAX) 250 MG tablet Take 2 today then 1 daily (Patient not taking: Reported on 01/07/2018) 6 tablet 0  . carvedilol (COREG) 6.25 MG tablet TAKE 1 TABLET BY MOUTH 2 TIMES DAILY WITH A MEAL 60 tablet 3  . cetirizine (ZYRTEC) 10 MG tablet Take 1 tablet (10 mg total) by mouth daily. 30 tablet 1  . losartan (COZAAR) 100 MG tablet Take 1 tablet (100 mg total) by mouth daily. (Patient not taking: Reported on 01/07/2018) 30 tablet 3  . oxybutynin (DITROPAN) 5 MG tablet TAKE 1 TABLET BY MOUTH 2 TIMES DAILY 60 tablet 3  . tiZANidine (ZANAFLEX) 4 MG tablet Take 1 tablet (4 mg total) by mouth at bedtime. (Patient not taking: Reported on 01/07/2018) 30 tablet 3  .  traMADol (ULTRAM) 50 MG tablet Take 1 tablet (50 mg total) by mouth every 12 (twelve) hours as needed. 60 tablet 2   No facility-administered medications prior to visit.     ROS Review of Systems  Constitutional: Negative for activity change, appetite change and fatigue.  HENT: Negative for congestion, sinus pressure and sore throat.   Eyes: Negative for visual disturbance.  Respiratory: Negative for cough, chest tightness, shortness of breath and wheezing.   Cardiovascular: Negative for  chest pain and palpitations.  Gastrointestinal: Negative for abdominal distention, abdominal pain and constipation.  Endocrine: Negative for polydipsia.  Genitourinary: Negative for dysuria and frequency.  Musculoskeletal: Negative for arthralgias and back pain.  Skin: Negative for rash.  Neurological: Negative for tremors, light-headedness and numbness.  Hematological: Does not bruise/bleed easily.  Psychiatric/Behavioral: Negative for agitation and behavioral problems.    Objective:  BP (!) 176/82   Pulse 83   Temp 98.3 F (36.8 C) (Oral)   Ht 5' (1.524 m)   Wt 153 lb 6.4 oz (69.6 kg)   SpO2 98%   BMI 29.96 kg/m   BP/Weight 03/24/2018 01/07/2018 5/64/3329  Systolic BP 518 841 660  Diastolic BP 82 94 80  Wt. (Lbs) 153.4 152.4 154  BMI 29.96 29.76 30.08     Physical Exam  Constitutional: She is oriented to person, place, and time. She appears well-developed and well-nourished.  Cardiovascular: Normal rate, normal heart sounds and intact distal pulses.  No murmur heard. Pulmonary/Chest: Effort normal and breath sounds normal. She has no wheezes. She has no rales. She exhibits no tenderness.  Abdominal: Soft. Bowel sounds are normal. She exhibits no distension and no mass. There is no tenderness.  Musculoskeletal: She exhibits tenderness (TTP of L3 down to coccyx).  Neurological: She is alert and oriented to person, place, and time.  Skin: Skin is warm and dry.  Psychiatric: She has a normal mood and affect.     Assessment & Plan:   1. Multiple myeloma in remission ([place]) Continue Zometa as per Oncology - traMADol (ULTRAM) 50 MG tablet; Take 1 tablet (50 mg total) by mouth every 12 (twelve) hours as needed.  Dispense: 60 tablet; Refill: 2  2. Chronic midline back pain, unspecified back location With Coccygeal pain of subacute onset, could be secondary to Multiple myeloma pain Advised to change position slowly - traMADol (ULTRAM) 50 MG tablet; Take 1 tablet (50 mg total)  by mouth every 12 (twelve) hours as needed.  Dispense: 60 tablet; Refill: 2 - tiZANidine (ZANAFLEX) 4 MG tablet; Take 1 tablet (4 mg total) by mouth at bedtime.  Dispense: 30 tablet; Refill: 3  3. Cough - cetirizine (ZYRTEC) 10 MG tablet; Take 1 tablet (10 mg total) by mouth daily.  Dispense: 30 tablet; Refill: 1  4. Overactive bladder Controlled - oxybutynin (DITROPAN) 5 MG tablet; Take 1 tablet (5 mg total) by mouth 2 (two) times daily.  Dispense: 60 tablet; Refill: 6  5. Essential hypertension Uncontrolled; BPs have remained high Will need to ensure compliance Increased dose of Carvedilol; monitor BP at home and report to the clinic - losartan (COZAAR) 100 MG tablet; Take 1 tablet (100 mg total) by mouth daily.  Dispense: 30 tablet; Refill: 6 - carvedilol (COREG) 12.5 MG tablet; Take 1 tablet (12.5 mg total) by mouth 2 (two) times daily with a meal.  Dispense: 60 tablet; Refill: 6   Meds ordered this encounter  Medications  . traMADol (ULTRAM) 50 MG tablet    Sig: Take 1  tablet (50 mg total) by mouth every 12 (twelve) hours as needed.    Dispense:  60 tablet    Refill:  2  . cetirizine (ZYRTEC) 10 MG tablet    Sig: Take 1 tablet (10 mg total) by mouth daily.    Dispense:  30 tablet    Refill:  1  . tiZANidine (ZANAFLEX) 4 MG tablet    Sig: Take 1 tablet (4 mg total) by mouth at bedtime.    Dispense:  30 tablet    Refill:  3  . oxybutynin (DITROPAN) 5 MG tablet    Sig: Take 1 tablet (5 mg total) by mouth 2 (two) times daily.    Dispense:  60 tablet    Refill:  6    .  . losartan (COZAAR) 100 MG tablet    Sig: Take 1 tablet (100 mg total) by mouth daily.    Dispense:  30 tablet    Refill:  6  . carvedilol (COREG) 12.5 MG tablet    Sig: Take 1 tablet (12.5 mg total) by mouth 2 (two) times daily with a meal.    Dispense:  60 tablet    Refill:  6    Discontinue previous dose    Follow-up: Return in about 3 months (around 06/24/2018) for Follow-up of chronic medical  conditions.   [name] MD

## 2018-04-08 ENCOUNTER — Inpatient Hospital Stay: Payer: Medicare Other | Attending: Internal Medicine

## 2018-04-08 DIAGNOSIS — C9000 Multiple myeloma not having achieved remission: Secondary | ICD-10-CM | POA: Insufficient documentation

## 2018-04-08 DIAGNOSIS — C9001 Multiple myeloma in remission: Secondary | ICD-10-CM

## 2018-04-08 LAB — CMP (CANCER CENTER ONLY)
ALBUMIN: 4.1 g/dL (ref 3.5–5.0)
ALT: 9 U/L (ref 0–55)
ANION GAP: 8 (ref 3–11)
AST: 19 U/L (ref 5–34)
Alkaline Phosphatase: 112 U/L (ref 40–150)
BILIRUBIN TOTAL: 1.4 mg/dL — AB (ref 0.2–1.2)
BUN: 10 mg/dL (ref 7–26)
CHLORIDE: 105 mmol/L (ref 98–109)
CO2: 27 mmol/L (ref 22–29)
Calcium: 9.8 mg/dL (ref 8.4–10.4)
Creatinine: 0.78 mg/dL (ref 0.60–1.10)
GFR, Est AFR Am: >60 mL/min (ref >60)
GFR, Estimated: >60 mL/min (ref >60)
GLUCOSE: 79 mg/dL (ref 70–140)
POTASSIUM: 3.4 mmol/L — AB (ref 3.5–5.1)
SODIUM: 140 mmol/L (ref 136–145)
TOTAL PROTEIN: 8.4 g/dL — AB (ref 6.4–8.3)

## 2018-04-08 LAB — CBC WITH DIFFERENTIAL (CANCER CENTER ONLY)
BASOS PCT: 0 %
Basophils Absolute: 0 10*3/uL (ref 0.0–0.1)
EOS ABS: 0.1 10*3/uL (ref 0.0–0.5)
EOS PCT: 1 %
HCT: 38 % (ref 34.8–46.6)
Hemoglobin: 12.5 g/dL (ref 11.6–15.9)
Lymphocytes Relative: 24 %
Lymphs Abs: 2 10*3/uL (ref 0.9–3.3)
MCH: 29.3 pg (ref 25.1–34.0)
MCHC: 32.9 g/dL (ref 31.5–36.0)
MCV: 88.9 fL (ref 79.5–101.0)
MONO ABS: 0.6 10*3/uL (ref 0.1–0.9)
MONOS PCT: 8 %
Neutro Abs: 5.4 10*3/uL (ref 1.5–6.5)
Neutrophils Relative %: 67 %
PLATELETS: 153 10*3/uL (ref 145–400)
RBC: 4.27 MIL/uL (ref 3.70–5.45)
RDW: 14.3 % (ref 11.2–14.5)
WBC Count: 8.2 10*3/uL (ref 3.9–10.3)

## 2018-04-08 LAB — LACTATE DEHYDROGENASE: LDH: 238 U/L (ref 125–245)

## 2018-04-09 LAB — IGG, IGA, IGM
IGM (IMMUNOGLOBULIN M), SRM: 263 mg/dL — AB (ref 26–217)
IgA: 317 mg/dL (ref 64–422)
IgG (Immunoglobin G), Serum: 1698 mg/dL — ABNORMAL HIGH (ref 700–1600)

## 2018-04-09 LAB — KAPPA/LAMBDA LIGHT CHAINS
KAPPA, LAMDA LIGHT CHAIN RATIO: 1.09 (ref 0.26–1.65)
Kappa free light chain: 23.9 mg/L — ABNORMAL HIGH (ref 3.3–19.4)
LAMDA FREE LIGHT CHAINS: 22 mg/L (ref 5.7–26.3)

## 2018-04-09 LAB — BETA 2 MICROGLOBULIN, SERUM: BETA 2 MICROGLOBULIN: 1.5 mg/L (ref 0.6–2.4)

## 2018-04-15 ENCOUNTER — Inpatient Hospital Stay (HOSPITAL_BASED_OUTPATIENT_CLINIC_OR_DEPARTMENT_OTHER): Payer: Medicare Other | Admitting: Internal Medicine

## 2018-04-15 ENCOUNTER — Telehealth: Payer: Self-pay

## 2018-04-15 ENCOUNTER — Encounter: Payer: Self-pay | Admitting: Internal Medicine

## 2018-04-15 ENCOUNTER — Inpatient Hospital Stay: Payer: Medicare Other

## 2018-04-15 VITALS — BP 167/113 | HR 94 | Temp 98.6°F | Resp 17 | Ht 60.0 in | Wt 155.5 lb

## 2018-04-15 DIAGNOSIS — C9001 Multiple myeloma in remission: Secondary | ICD-10-CM

## 2018-04-15 DIAGNOSIS — I10 Essential (primary) hypertension: Secondary | ICD-10-CM

## 2018-04-15 DIAGNOSIS — C9000 Multiple myeloma not having achieved remission: Secondary | ICD-10-CM | POA: Diagnosis not present

## 2018-04-15 MED ORDER — SODIUM CHLORIDE 0.9 % IV SOLN
Freq: Once | INTRAVENOUS | Status: AC
  Administered 2018-04-15: 15:00:00 via INTRAVENOUS

## 2018-04-15 MED ORDER — ZOLEDRONIC ACID 4 MG/100ML IV SOLN
4.0000 mg | Freq: Once | INTRAVENOUS | Status: AC
  Administered 2018-04-15: 4 mg via INTRAVENOUS
  Filled 2018-04-15: qty 100

## 2018-04-15 NOTE — Telephone Encounter (Signed)
Printed avs and calender of upcoming appointment. per 6/Labs 1 week before.25 los

## 2018-04-15 NOTE — Progress Notes (Signed)
[place] Telephone:(336) [phone]   Fax:(336) [phone]  OFFICE PROGRESS NOTE  [name], MD [place] [address]  DIAGNOSIS: Multiple myeloma, IgG kappa subtype diagnosed in October 2008.   PRIOR THERAPY:  1. Status post radiotherapy to the lower lumbar spine and left pelvic area under the care of Dr. Sondra Come. The patient received a total dose of 3000 cGy. in 15 fractions between 10/14/2007 through 11/07/2007  2.Status post 6 cycles of systemic chemotherapy with Revlimid and Decadron. Last dose given April 2009.  3. status post autologous peripheral blood stem cell transplant on 04/01/2008 under the care of Dr. Valarie Merino at [facility].   CURRENT THERAPY: Zometa 4 mg IV given every 3 months.  INTERVAL HISTORY: Ruth Sanchez 72 y.o. female returns to the clinic today for follow-up visit accompanied by her daughter-in-law.  The patient is feeling fine today with no specific complaints.  Unfortunately she lost her husband to multiple strokes few weeks ago.  She denied having any chest pain, shortness breath, cough or hemoptysis.  She denied having any recent weight loss or night sweats.  She has no nausea, vomiting, diarrhea or constipation.  She had repeat myeloma panel performed recently and she is here for evaluation and discussion of her lab results.  MEDICAL HISTORY: Past Medical History:  Diagnosis Date  . Arthritis   . Asthma   . Cancer ([place])   . Hypertension     ALLERGIES:  is allergic to gabapentin and aspirin.  MEDICATIONS:  Current Outpatient Medications  Medication Sig Dispense Refill  . albuterol (PROVENTIL HFA;VENTOLIN HFA) 108 (90 Base) MCG/ACT inhaler Inhale 2 puffs into the lungs every 4 (four) hours as needed for wheezing or shortness of breath. (Patient not taking: Reported on 01/07/2018) 1 Inhaler 3  . carvedilol (COREG) 12.5 MG tablet Take 1 tablet (12.5 mg total) by mouth 2 (two) times daily with a meal. 60  tablet 6  . cetirizine (ZYRTEC) 10 MG tablet Take 1 tablet (10 mg total) by mouth daily. 30 tablet 1  . diphenoxylate-atropine (LOMOTIL) 2.5-0.025 MG tablet TAKE 1 TABLET BY MOUTH 3 TIMES DAILY AS NEEDED FOR DIARRHEA OR loose stools (Patient not taking: Reported on 12/17/2017) 40 tablet 1  . losartan (COZAAR) 100 MG tablet Take 1 tablet (100 mg total) by mouth daily. 30 tablet 6  . mometasone-formoterol (DULERA) 100-5 MCG/ACT AERO Inhale 2 puffs into the lungs 2 (two) times daily. (Patient not taking: Reported on 01/07/2018) 180 Inhaler 3  . oxybutynin (DITROPAN) 5 MG tablet Take 1 tablet (5 mg total) by mouth 2 (two) times daily. 60 tablet 6  . tiZANidine (ZANAFLEX) 4 MG tablet Take 1 tablet (4 mg total) by mouth at bedtime. 30 tablet 3  . traMADol (ULTRAM) 50 MG tablet Take 1 tablet (50 mg total) by mouth every 12 (twelve) hours as needed. 60 tablet 2   No current facility-administered medications for this visit.     SURGICAL HISTORY:  Past Surgical History:  Procedure Laterality Date  . APPENDECTOMY    . CHOLECYSTECTOMY    . ESOPHAGOGASTRODUODENOSCOPY N/A 11/24/2015   Procedure: ESOPHAGOGASTRODUODENOSCOPY (EGD);  Surgeon: Clarene Essex, MD;  Location: [facility];  Service: Endoscopy;  Laterality: N/A;  . TUBAL LIGATION      REVIEW OF SYSTEMS:  A comprehensive review of systems was negative except for: Constitutional: positive for fatigue   PHYSICAL EXAMINATION: General appearance: alert, cooperative, fatigued and no distress Head: Normocephalic, without obvious  abnormality, atraumatic Neck: no adenopathy Lymph nodes: Cervical, supraclavicular, and axillary nodes normal. Resp: clear to auscultation bilaterally Back: symmetric, no curvature. ROM normal. No CVA tenderness. Cardio: regular rate and rhythm, S1, S2 normal, no murmur, click, rub or gallop GI: soft, non-tender; bowel sounds normal; no masses,  no organomegaly Extremities: extremities normal, atraumatic, no cyanosis or  edema   ECOG PERFORMANCE STATUS: 1 - Symptomatic but completely ambulatory  Blood pressure (!) 167/113, pulse 94, temperature 98.6 F (37 C), temperature source Oral, resp. rate 17, height 5' (1.524 m), weight 155 lb 8 oz (70.5 kg), SpO2 98 %.  LABORATORY DATA: Lab Results  Component Value Date   WBC 8.2 04/08/2018   HGB 12.5 04/08/2018   HCT 38.0 04/08/2018   MCV 88.9 04/08/2018   PLT 153 04/08/2018      Chemistry      Component Value Date/Time   NA 140 04/08/2018 1354   NA 146 (H) 09/27/2017 1528   NA 143 05/28/2017 1141   K 3.4 (L) 04/08/2018 1354   K 3.5 05/28/2017 1141   CL 105 04/08/2018 1354   CL 104 01/19/2013 1058   CO2 27 04/08/2018 1354   CO2 28 05/28/2017 1141   BUN 10 04/08/2018 1354   BUN 15 09/27/2017 1528   BUN 10.4 05/28/2017 1141   CREATININE 0.78 04/08/2018 1354   CREATININE 0.8 05/28/2017 1141      Component Value Date/Time   CALCIUM 9.8 04/08/2018 1354   CALCIUM 9.7 05/28/2017 1141   ALKPHOS 112 04/08/2018 1354   ALKPHOS 122 05/28/2017 1141   AST 19 04/08/2018 1354   AST 18 05/28/2017 1141   ALT 9 04/08/2018 1354   ALT 14 05/28/2017 1141   BILITOT 1.4 (H) 04/08/2018 1354   BILITOT 0.86 05/28/2017 1141     Other lab results:   RADIOGRAPHIC STUDIES: No results found.  ASSESSMENT AND PLAN: This is a very pleasant 72 years old African American female with history of multiple myeloma status post systemic chemotherapy with Revlimid and Decadron followed by this peripheral blood stem cell transplant and has been observation since June of 2009 with no significant evidence for disease progression.  The patient is doing well today.  Her myeloma panel showed no clear evidence for disease progression. I discussed the lab results with the patient and her daughter-in-law and recommended for her to continue on observation with repeat myeloma panel in 3 months. She will continue on Zometa infusion every 3 months for the bone disease. The patient was  advised to call immediately if she has any concerning symptoms in the interval. All questions were answered. The patient knows to call the clinic with any problems, questions or concerns. We can certainly see the patient much sooner if necessary.  Disclaimer: This note was dictated with voice recognition software. Similar sounding words can inadvertently be transcribed and may not be corrected upon review.

## 2018-04-22 ENCOUNTER — Other Ambulatory Visit: Payer: Self-pay | Admitting: Family Medicine

## 2018-04-22 DIAGNOSIS — R059 Cough, unspecified: Secondary | ICD-10-CM

## 2018-04-22 DIAGNOSIS — R05 Cough: Secondary | ICD-10-CM

## 2018-05-24 ENCOUNTER — Other Ambulatory Visit: Payer: Self-pay | Admitting: Family Medicine

## 2018-05-24 DIAGNOSIS — M549 Dorsalgia, unspecified: Secondary | ICD-10-CM

## 2018-05-24 DIAGNOSIS — G8929 Other chronic pain: Secondary | ICD-10-CM

## 2018-05-24 DIAGNOSIS — C9001 Multiple myeloma in remission: Secondary | ICD-10-CM

## 2018-06-24 ENCOUNTER — Encounter: Payer: Self-pay | Admitting: Family Medicine

## 2018-06-24 ENCOUNTER — Ambulatory Visit: Payer: Medicare Other | Attending: Family Medicine | Admitting: Family Medicine

## 2018-06-24 DIAGNOSIS — Z79899 Other long term (current) drug therapy: Secondary | ICD-10-CM | POA: Diagnosis not present

## 2018-06-24 DIAGNOSIS — J4520 Mild intermittent asthma, uncomplicated: Secondary | ICD-10-CM | POA: Diagnosis not present

## 2018-06-24 DIAGNOSIS — Z886 Allergy status to analgesic agent status: Secondary | ICD-10-CM | POA: Insufficient documentation

## 2018-06-24 DIAGNOSIS — Z9221 Personal history of antineoplastic chemotherapy: Secondary | ICD-10-CM | POA: Insufficient documentation

## 2018-06-24 DIAGNOSIS — I10 Essential (primary) hypertension: Secondary | ICD-10-CM | POA: Insufficient documentation

## 2018-06-24 DIAGNOSIS — Z888 Allergy status to other drugs, medicaments and biological substances status: Secondary | ICD-10-CM | POA: Diagnosis not present

## 2018-06-24 DIAGNOSIS — Z9484 Stem cells transplant status: Secondary | ICD-10-CM | POA: Diagnosis not present

## 2018-06-24 DIAGNOSIS — M545 Low back pain: Secondary | ICD-10-CM | POA: Diagnosis present

## 2018-06-24 DIAGNOSIS — C9001 Multiple myeloma in remission: Secondary | ICD-10-CM | POA: Insufficient documentation

## 2018-06-24 DIAGNOSIS — R05 Cough: Secondary | ICD-10-CM | POA: Diagnosis not present

## 2018-06-24 DIAGNOSIS — G8929 Other chronic pain: Secondary | ICD-10-CM | POA: Diagnosis not present

## 2018-06-24 DIAGNOSIS — M549 Dorsalgia, unspecified: Secondary | ICD-10-CM

## 2018-06-24 DIAGNOSIS — R059 Cough, unspecified: Secondary | ICD-10-CM

## 2018-06-24 DIAGNOSIS — Z9049 Acquired absence of other specified parts of digestive tract: Secondary | ICD-10-CM | POA: Diagnosis not present

## 2018-06-24 DIAGNOSIS — M1990 Unspecified osteoarthritis, unspecified site: Secondary | ICD-10-CM | POA: Insufficient documentation

## 2018-06-24 MED ORDER — LOSARTAN POTASSIUM 100 MG PO TABS: 100.0000 mg | ORAL_TABLET | Freq: Every day | ORAL | 6 refills | Status: DC

## 2018-06-24 MED ORDER — CARVEDILOL 12.5 MG PO TABS: 12.5000 mg | ORAL_TABLET | Freq: Two times a day (BID) | ORAL | 6 refills | Status: DC

## 2018-06-24 MED ORDER — ALBUTEROL SULFATE HFA 108 (90 BASE) MCG/ACT IN AERS: 2.0000 | INHALATION_SPRAY | RESPIRATORY_TRACT | 3 refills | Status: AC | PRN

## 2018-06-24 MED ORDER — TIZANIDINE HCL 4 MG PO TABS: 4.0000 mg | ORAL_TABLET | Freq: Every day | ORAL | 3 refills | Status: DC

## 2018-06-24 MED ORDER — TRAMADOL HCL 50 MG PO TABS: 50.0000 mg | ORAL_TABLET | Freq: Two times a day (BID) | ORAL | 2 refills | Status: DC | PRN

## 2018-06-24 MED ORDER — CETIRIZINE HCL 10 MG PO TABS: 10.0000 mg | ORAL_TABLET | Freq: Every day | ORAL | 2 refills | Status: AC

## 2018-06-24 MED ORDER — AMLODIPINE BESYLATE 2.5 MG PO TABS: 2.5000 mg | ORAL_TABLET | Freq: Every day | ORAL | 3 refills | Status: DC

## 2018-06-24 NOTE — Progress Notes (Signed)
Patient needs refill on Zyrtec.

## 2018-06-24 NOTE — Progress Notes (Signed)
Subjective:  Patient ID: Ruth Sanchez, female    DOB: 1946-04-12  Age: 72 y.o. MRN: [id number]  CC: Back pain  HPI Ruth Sanchez  is a 72 year old female with a history of Multiple Myeloma (status post chemotherapy, status post stem cell transplant in remission managed by oncology and currently on Zometa injections every 3 months), chronic low back pain, Hypertension, intermittent Asthma here for a follow up visit accompanied by her son, Ruth Sanchez. Her blood pressure is significantly elevated and she endorses compliance with her antihypertensive.  Reports that that her systolic blood pressure have been in the range of 130-150. She has noticed intermittent coughing and was on Zyrtec previously which helped with this but is requesting refills at this time. Her back pain is rated as moderate at this time and controlled on tramadol; she stopped using her back brace because it worsened her symptoms.  Denies numbness in extremities.  Has to walk bent over with a cane due to her chronic back pain. Last office visit with Dr. Earlie Server, her Oncologist was 2 months ago.  Past Medical History:  Diagnosis Date  . Arthritis   . Asthma   . Cancer ([place])   . Hypertension     Past Surgical History:  Procedure Laterality Date  . APPENDECTOMY    . CHOLECYSTECTOMY    . ESOPHAGOGASTRODUODENOSCOPY N/A 11/24/2015   Procedure: ESOPHAGOGASTRODUODENOSCOPY (EGD);  Surgeon: Clarene Essex, MD;  Location: [facility];  Service: Endoscopy;  Laterality: N/A;  . TUBAL LIGATION      Allergies  Allergen Reactions  . Gabapentin Swelling and Other (See Comments)    Reaction:  Lip swelling  . Aspirin Itching and Rash     Outpatient Medications Prior to Visit  Medication Sig Dispense Refill  . oxybutynin (DITROPAN) 5 MG tablet Take 1 tablet (5 mg total) by mouth 2 (two) times daily. 60 tablet 6  . albuterol (PROVENTIL HFA;VENTOLIN HFA) 108 (90 Base) MCG/ACT inhaler Inhale 2 puffs into the lungs every 4  (four) hours as needed for wheezing or shortness of breath. 1 Inhaler 3  . carvedilol (COREG) 12.5 MG tablet Take 1 tablet (12.5 mg total) by mouth 2 (two) times daily with a meal. 60 tablet 6  . cetirizine (ZYRTEC) 10 MG tablet TAKE 1 TABLET BY MOUTH EVERY DAY 30 tablet 2  . tiZANidine (ZANAFLEX) 4 MG tablet Take 1 tablet (4 mg total) by mouth at bedtime. 30 tablet 3  . traMADol (ULTRAM) 50 MG tablet Take 1 tablet (50 mg total) by mouth every 12 (twelve) hours as needed. 60 tablet 2  . diphenoxylate-atropine (LOMOTIL) 2.5-0.025 MG tablet TAKE 1 TABLET BY MOUTH 3 TIMES DAILY AS NEEDED FOR DIARRHEA OR loose stools (Patient not taking: Reported on 12/17/2017) 40 tablet 1  . mometasone-formoterol (DULERA) 100-5 MCG/ACT AERO Inhale 2 puffs into the lungs 2 (two) times daily. (Patient not taking: Reported on 01/07/2018) 180 Inhaler 3  . losartan (COZAAR) 100 MG tablet Take 1 tablet (100 mg total) by mouth daily. 30 tablet 6   No facility-administered medications prior to visit.     ROS Review of Systems  Constitutional: Negative for activity change, appetite change and fatigue.  HENT: Negative for congestion, sinus pressure and sore throat.   Eyes: Negative for visual disturbance.  Respiratory: Positive for cough. Negative for chest tightness, shortness of breath and wheezing.   Cardiovascular: Negative for chest pain and palpitations.  Gastrointestinal: Negative for abdominal distention, abdominal pain and constipation.  Endocrine:  Negative for polydipsia.  Genitourinary: Negative for dysuria and frequency.  Musculoskeletal: Positive for back pain. Negative for arthralgias.  Skin: Negative for rash.  Neurological: Negative for tremors, light-headedness and numbness.  Hematological: Does not bruise/bleed easily.  Psychiatric/Behavioral: Negative for agitation and behavioral problems.    Objective:  BP (!) 172/128   Pulse 85   Temp 98.4 F (36.9 C) (Oral)   Ht 5' (1.524 m)   Wt 152 lb  (68.9 kg)   SpO2 98%   BMI 29.69 kg/m   BP/Weight 06/24/2018 4/68/0321 11/24/4823  Systolic BP 003 704 888  Diastolic BP 916 945 82  Wt. (Lbs) 152 155.5 153.4  BMI 29.69 30.37 29.96      Physical Exam  Constitutional: She is oriented to person, place, and time. She appears well-developed and well-nourished.  Cardiovascular: Normal rate, normal heart sounds and intact distal pulses.  No murmur heard. Pulmonary/Chest: Effort normal and breath sounds normal. She has no wheezes. She has no rales. She exhibits no tenderness.  Abdominal: Soft. Bowel sounds are normal. She exhibits no distension and no mass. There is no tenderness.  Musculoskeletal:  Tenderness on palpation of lumbar spine  Neurological: She is alert and oriented to person, place, and time.  Skin: Skin is warm and dry.  Psychiatric: She has a normal mood and affect.      CMP Latest Ref Rng & Units 04/08/2018 12/23/2017 09/27/2017  Glucose 70 - 140 mg/dL 79 90 74  BUN 7 - 26 mg/dL '10 12 15  ' Creatinine 0.60 - 1.10 mg/dL 0.78 1.00 0.88  Sodium 136 - 145 mmol/L 140 139 146(H)  Potassium 3.5 - 5.1 mmol/L 3.4(L) 3.4(L) 3.6  Chloride 98 - 109 mmol/L 105 102 102  CO2 22 - 29 mmol/L '27 26 27  ' Calcium 8.4 - 10.4 mg/dL 9.8 9.1 9.9  Total Protein 6.4 - 8.3 g/dL 8.4(H) 7.4 7.4  Total Bilirubin 0.2 - 1.2 mg/dL 1.4(H) 1.0 0.8  Alkaline Phos 40 - 150 U/L 112 80 118(H)  AST 5 - 34 U/L '19 26 23  ' ALT 0 - 55 U/L '9 16 17    ' Assessment & Plan:   1. Multiple myeloma in remission ([place]) Currently on Zometa Followed by oncology - traMADol (ULTRAM) 50 MG tablet; Take 1 tablet (50 mg total) by mouth every 12 (twelve) hours as needed.  Dispense: 60 tablet; Refill: 2  2. Chronic midline back pain, unspecified back location Secondary to multiple myeloma - Drug Screen 12+Alcohol+CRT, Ur - traMADol (ULTRAM) 50 MG tablet; Take 1 tablet (50 mg total) by mouth every 12 (twelve) hours as needed.  Dispense: 60 tablet; Refill: 2 - tiZANidine  (ZANAFLEX) 4 MG tablet; Take 1 tablet (4 mg total) by mouth at bedtime.  Dispense: 30 tablet; Refill: 3  3. Essential hypertension Uncontrolled Systolic blood pressures at home have been in the 130-160 range We will add low-dose amlodipine Counseled on blood pressure goal of less than 130/80, low-sodium, DASH diet, medication compliance, 150 minutes of moderate intensity exercise per week. Discussed medication compliance, adverse effects. - amLODipine (NORVASC) 2.5 MG tablet; Take 1 tablet (2.5 mg total) by mouth daily.  Dispense: 30 tablet; Refill: 3 - losartan (COZAAR) 100 MG tablet; Take 1 tablet (100 mg total) by mouth daily.  Dispense: 30 tablet; Refill: 6 - carvedilol (COREG) 12.5 MG tablet; Take 1 tablet (12.5 mg total) by mouth 2 (two) times daily with a meal.  Dispense: 60 tablet; Refill: 6  4. Cough Likely due to non  seasonal allergies - cetirizine (ZYRTEC) 10 MG tablet; Take 1 tablet (10 mg total) by mouth daily.  Dispense: 30 tablet; Refill: 2  5. Mild intermittent chronic asthma without complication No recent exacerbation - albuterol (PROVENTIL HFA;VENTOLIN HFA) 108 (90 Base) MCG/ACT inhaler; Inhale 2 puffs into the lungs every 4 (four) hours as needed for wheezing or shortness of breath.  Dispense: 1 Inhaler; Refill: 3   Meds ordered this encounter  Medications  . amLODipine (NORVASC) 2.5 MG tablet    Sig: Take 1 tablet (2.5 mg total) by mouth daily.    Dispense:  30 tablet    Refill:  3  . traMADol (ULTRAM) 50 MG tablet    Sig: Take 1 tablet (50 mg total) by mouth every 12 (twelve) hours as needed.    Dispense:  60 tablet    Refill:  2  . tiZANidine (ZANAFLEX) 4 MG tablet    Sig: Take 1 tablet (4 mg total) by mouth at bedtime.    Dispense:  30 tablet    Refill:  3  . losartan (COZAAR) 100 MG tablet    Sig: Take 1 tablet (100 mg total) by mouth daily.    Dispense:  30 tablet    Refill:  6  . cetirizine (ZYRTEC) 10 MG tablet    Sig: Take 1 tablet (10 mg total) by  mouth daily.    Dispense:  30 tablet    Refill:  2    This prescription was filled on 04/22/2018. Any refills authorized will be placed on file.  . carvedilol (COREG) 12.5 MG tablet    Sig: Take 1 tablet (12.5 mg total) by mouth 2 (two) times daily with a meal.    Dispense:  60 tablet    Refill:  6    Discontinue previous dose  . albuterol (PROVENTIL HFA;VENTOLIN HFA) 108 (90 Base) MCG/ACT inhaler    Sig: Inhale 2 puffs into the lungs every 4 (four) hours as needed for wheezing or shortness of breath.    Dispense:  1 Inhaler    Refill:  3    Follow-up: Return in about 3 months (around 09/23/2018) for follow up of chronic medical conditions.   [name] MD

## 2018-06-24 NOTE — Patient Instructions (Signed)

## 2018-06-26 LAB — DRUG SCREEN 12+ALCOHOL+CRT, UR
AMPHETAMINES, URINE: NEGATIVE ng/mL
BARBITURATE: NEGATIVE ng/mL
BENZODIAZ UR QL: NEGATIVE ng/mL
Cannabinoids: NEGATIVE ng/mL
Cocaine (Metabolite): NEGATIVE ng/mL
Creatinine, Urine: 23.3 mg/dL (ref 20.0–300.0)
Ethanol, Urine: NEGATIVE %
MEPERIDINE: NEGATIVE ng/mL
Methadone: NEGATIVE ng/mL
OPIATE SCREEN URINE: NEGATIVE ng/mL
OXYCODONE+OXYMORPHONE UR QL SCN: NEGATIVE ng/mL
Phencyclidine: NEGATIVE ng/mL
Propoxyphene: NEGATIVE ng/mL
Tramadol: POSITIVE — AB

## 2018-06-30 ENCOUNTER — Inpatient Hospital Stay (HOSPITAL_COMMUNITY)
Admission: EM | Admit: 2018-06-30 | Discharge: 2018-07-08 | DRG: 023 | Disposition: A | Discharge status: Skilled Nursing Facility | Payer: Medicare Other | Attending: Neurology | Admitting: Neurology

## 2018-06-30 ENCOUNTER — Emergency Department (HOSPITAL_COMMUNITY): Payer: Medicare Other

## 2018-06-30 ENCOUNTER — Emergency Department (HOSPITAL_COMMUNITY): Payer: Medicare Other | Admitting: Certified Registered"

## 2018-06-30 ENCOUNTER — Encounter (HOSPITAL_COMMUNITY)
Admission: EM | Disposition: A | Discharge status: Home / Self Care | Payer: Self-pay | Source: Home / Self Care | Attending: Neurology

## 2018-06-30 DIAGNOSIS — G460 Middle cerebral artery syndrome: Secondary | ICD-10-CM | POA: Diagnosis present

## 2018-06-30 DIAGNOSIS — I69351 Hemiplegia and hemiparesis following cerebral infarction affecting right dominant side: Secondary | ICD-10-CM | POA: Diagnosis not present

## 2018-06-30 DIAGNOSIS — E785 Hyperlipidemia, unspecified: Secondary | ICD-10-CM | POA: Diagnosis present

## 2018-06-30 DIAGNOSIS — I69391 Dysphagia following cerebral infarction: Secondary | ICD-10-CM

## 2018-06-30 DIAGNOSIS — R402222 Coma scale, best verbal response, incomprehensible words, at arrival to emergency department: Secondary | ICD-10-CM | POA: Diagnosis present

## 2018-06-30 DIAGNOSIS — M2550 Pain in unspecified joint: Secondary | ICD-10-CM | POA: Diagnosis not present

## 2018-06-30 DIAGNOSIS — R404 Transient alteration of awareness: Secondary | ICD-10-CM | POA: Diagnosis not present

## 2018-06-30 DIAGNOSIS — I674 Hypertensive encephalopathy: Secondary | ICD-10-CM | POA: Diagnosis present

## 2018-06-30 DIAGNOSIS — I69320 Aphasia following cerebral infarction: Secondary | ICD-10-CM | POA: Diagnosis not present

## 2018-06-30 DIAGNOSIS — Z888 Allergy status to other drugs, medicaments and biological substances status: Secondary | ICD-10-CM

## 2018-06-30 DIAGNOSIS — I6602 Occlusion and stenosis of left middle cerebral artery: Secondary | ICD-10-CM | POA: Diagnosis not present

## 2018-06-30 DIAGNOSIS — R29726 NIHSS score 26: Secondary | ICD-10-CM | POA: Diagnosis present

## 2018-06-30 DIAGNOSIS — Y848 Other medical procedures as the cause of abnormal reaction of the patient, or of later complication, without mention of misadventure at the time of the procedure: Secondary | ICD-10-CM | POA: Diagnosis not present

## 2018-06-30 DIAGNOSIS — R1312 Dysphagia, oropharyngeal phase: Secondary | ICD-10-CM | POA: Diagnosis not present

## 2018-06-30 DIAGNOSIS — L7682 Other postprocedural complications of skin and subcutaneous tissue: Secondary | ICD-10-CM | POA: Diagnosis not present

## 2018-06-30 DIAGNOSIS — I6350 Cerebral infarction due to unspecified occlusion or stenosis of unspecified cerebral artery: Secondary | ICD-10-CM | POA: Diagnosis not present

## 2018-06-30 DIAGNOSIS — Z79899 Other long term (current) drug therapy: Secondary | ICD-10-CM | POA: Diagnosis not present

## 2018-06-30 DIAGNOSIS — H510 Palsy (spasm) of conjugate gaze: Secondary | ICD-10-CM | POA: Diagnosis not present

## 2018-06-30 DIAGNOSIS — R471 Dysarthria and anarthria: Secondary | ICD-10-CM | POA: Diagnosis present

## 2018-06-30 DIAGNOSIS — R402342 Coma scale, best motor response, flexion withdrawal, at arrival to emergency department: Secondary | ICD-10-CM | POA: Diagnosis present

## 2018-06-30 DIAGNOSIS — Z7401 Bed confinement status: Secondary | ICD-10-CM | POA: Diagnosis not present

## 2018-06-30 DIAGNOSIS — I669 Occlusion and stenosis of unspecified cerebral artery: Secondary | ICD-10-CM | POA: Diagnosis not present

## 2018-06-30 DIAGNOSIS — Z886 Allergy status to analgesic agent status: Secondary | ICD-10-CM

## 2018-06-30 DIAGNOSIS — S31109A Unspecified open wound of abdominal wall, unspecified quadrant without penetration into peritoneal cavity, initial encounter: Secondary | ICD-10-CM | POA: Diagnosis not present

## 2018-06-30 DIAGNOSIS — T8149XA Infection following a procedure, other surgical site, initial encounter: Secondary | ICD-10-CM | POA: Diagnosis not present

## 2018-06-30 DIAGNOSIS — R4701 Aphasia: Secondary | ICD-10-CM | POA: Diagnosis present

## 2018-06-30 DIAGNOSIS — Z4659 Encounter for fitting and adjustment of other gastrointestinal appliance and device: Secondary | ICD-10-CM

## 2018-06-30 DIAGNOSIS — G8191 Hemiplegia, unspecified affecting right dominant side: Secondary | ICD-10-CM | POA: Diagnosis present

## 2018-06-30 DIAGNOSIS — G936 Cerebral edema: Secondary | ICD-10-CM | POA: Diagnosis present

## 2018-06-30 DIAGNOSIS — C9001 Multiple myeloma in remission: Secondary | ICD-10-CM | POA: Diagnosis present

## 2018-06-30 DIAGNOSIS — R1310 Dysphagia, unspecified: Secondary | ICD-10-CM | POA: Diagnosis present

## 2018-06-30 DIAGNOSIS — E876 Hypokalemia: Secondary | ICD-10-CM

## 2018-06-30 DIAGNOSIS — I10 Essential (primary) hypertension: Secondary | ICD-10-CM | POA: Diagnosis present

## 2018-06-30 DIAGNOSIS — Y92238 Other place in hospital as the place of occurrence of the external cause: Secondary | ICD-10-CM | POA: Diagnosis not present

## 2018-06-30 DIAGNOSIS — J690 Pneumonitis due to inhalation of food and vomit: Secondary | ICD-10-CM | POA: Diagnosis present

## 2018-06-30 DIAGNOSIS — I6601 Occlusion and stenosis of right middle cerebral artery: Secondary | ICD-10-CM | POA: Diagnosis not present

## 2018-06-30 DIAGNOSIS — I639 Cerebral infarction, unspecified: Secondary | ICD-10-CM | POA: Diagnosis not present

## 2018-06-30 DIAGNOSIS — Z7951 Long term (current) use of inhaled steroids: Secondary | ICD-10-CM

## 2018-06-30 DIAGNOSIS — J45909 Unspecified asthma, uncomplicated: Secondary | ICD-10-CM | POA: Diagnosis present

## 2018-06-30 DIAGNOSIS — I4891 Unspecified atrial fibrillation: Secondary | ICD-10-CM | POA: Diagnosis not present

## 2018-06-30 DIAGNOSIS — R402142 Coma scale, eyes open, spontaneous, at arrival to emergency department: Secondary | ICD-10-CM | POA: Diagnosis present

## 2018-06-30 DIAGNOSIS — I480 Paroxysmal atrial fibrillation: Secondary | ICD-10-CM | POA: Diagnosis present

## 2018-06-30 DIAGNOSIS — I63412 Cerebral infarction due to embolism of left middle cerebral artery: Principal | ICD-10-CM | POA: Diagnosis present

## 2018-06-30 DIAGNOSIS — I671 Cerebral aneurysm, nonruptured: Secondary | ICD-10-CM | POA: Diagnosis not present

## 2018-06-30 DIAGNOSIS — M5489 Other dorsalgia: Secondary | ICD-10-CM | POA: Diagnosis not present

## 2018-06-30 DIAGNOSIS — R0602 Shortness of breath: Secondary | ICD-10-CM | POA: Diagnosis not present

## 2018-06-30 DIAGNOSIS — E049 Nontoxic goiter, unspecified: Secondary | ICD-10-CM | POA: Diagnosis present

## 2018-06-30 DIAGNOSIS — I619 Nontraumatic intracerebral hemorrhage, unspecified: Secondary | ICD-10-CM | POA: Diagnosis present

## 2018-06-30 DIAGNOSIS — R05 Cough: Secondary | ICD-10-CM | POA: Diagnosis not present

## 2018-06-30 DIAGNOSIS — I6523 Occlusion and stenosis of bilateral carotid arteries: Secondary | ICD-10-CM | POA: Diagnosis not present

## 2018-06-30 DIAGNOSIS — I340 Nonrheumatic mitral (valve) insufficiency: Secondary | ICD-10-CM | POA: Diagnosis not present

## 2018-06-30 DIAGNOSIS — R4182 Altered mental status, unspecified: Secondary | ICD-10-CM | POA: Diagnosis not present

## 2018-06-30 DIAGNOSIS — Z4682 Encounter for fitting and adjustment of non-vascular catheter: Secondary | ICD-10-CM | POA: Diagnosis not present

## 2018-06-30 DIAGNOSIS — D72829 Elevated white blood cell count, unspecified: Secondary | ICD-10-CM

## 2018-06-30 DIAGNOSIS — H518 Other specified disorders of binocular movement: Secondary | ICD-10-CM | POA: Diagnosis present

## 2018-06-30 HISTORY — PX: RADIOLOGY WITH ANESTHESIA: SHX6223

## 2018-06-30 HISTORY — PX: IR CT HEAD LTD: IMG2386

## 2018-06-30 HISTORY — PX: IR PERCUTANEOUS ART THROMBECTOMY/INFUSION INTRACRANIAL INC DIAG ANGIO: IMG6087

## 2018-06-30 LAB — CBC
HCT: 40.3 % (ref 36.0–46.0)
HEMOGLOBIN: 12.5 g/dL (ref 12.0–15.0)
MCH: 29 pg (ref 26.0–34.0)
MCHC: 31 g/dL (ref 30.0–36.0)
MCV: 93.5 fL (ref 78.0–100.0)
Platelets: 125 10*3/uL — ABNORMAL LOW (ref 150–400)
RBC: 4.31 MIL/uL (ref 3.87–5.11)
RDW: 13.2 % (ref 11.5–15.5)
WBC: 8.4 10*3/uL (ref 4.0–10.5)

## 2018-06-30 LAB — I-STAT CHEM 8, ED
BUN: 13 mg/dL (ref 8–23)
CALCIUM ION: 1.14 mmol/L — AB (ref 1.15–1.40)
CHLORIDE: 106 mmol/L (ref 98–111)
CREATININE: 0.7 mg/dL (ref 0.44–1.00)
GLUCOSE: 107 mg/dL — AB (ref 70–99)
HCT: 38 % (ref 36.0–46.0)
Hemoglobin: 12.9 g/dL (ref 12.0–15.0)
POTASSIUM: 2.9 mmol/L — AB (ref 3.5–5.1)
Sodium: 142 mmol/L (ref 135–145)
TCO2: 23 mmol/L (ref 22–32)

## 2018-06-30 LAB — COMPREHENSIVE METABOLIC PANEL
ALBUMIN: 3.8 g/dL (ref 3.5–5.0)
ALT: 13 U/L (ref 0–44)
AST: 23 U/L (ref 15–41)
Alkaline Phosphatase: 107 U/L (ref 38–126)
Anion gap: 9 (ref 5–15)
BUN: 12 mg/dL (ref 8–23)
CALCIUM: 9.6 mg/dL (ref 8.9–10.3)
CHLORIDE: 108 mmol/L (ref 98–111)
CO2: 25 mmol/L (ref 22–32)
CREATININE: 0.78 mg/dL (ref 0.44–1.00)
GFR calc Af Amer: >60 mL/min (ref >60)
GFR calc non Af Amer: >60 mL/min (ref >60)
Glucose, Bld: 108 mg/dL — ABNORMAL HIGH (ref 70–99)
Potassium: 3 mmol/L — ABNORMAL LOW (ref 3.5–5.1)
SODIUM: 142 mmol/L (ref 135–145)
Total Bilirubin: 1.3 mg/dL — ABNORMAL HIGH (ref 0.3–1.2)
Total Protein: 7.7 g/dL (ref 6.5–8.1)

## 2018-06-30 LAB — APTT: APTT: 31 s (ref 24–36)

## 2018-06-30 LAB — I-STAT TROPONIN, ED: TROPONIN I, POC: 0.01 ng/mL (ref 0.00–0.08)

## 2018-06-30 LAB — DIFFERENTIAL
ABS IMMATURE GRANULOCYTES: 0 10*3/uL (ref 0.0–0.1)
BASOS PCT: 0 %
Basophils Absolute: 0 10*3/uL (ref 0.0–0.1)
Eosinophils Absolute: 0.1 10*3/uL (ref 0.0–0.7)
Eosinophils Relative: 1 %
Immature Granulocytes: 1 %
Lymphocytes Relative: 20 %
Lymphs Abs: 1.7 10*3/uL (ref 0.7–4.0)
Monocytes Absolute: 0.4 10*3/uL (ref 0.1–1.0)
Monocytes Relative: 5 %
NEUTROS ABS: 6.2 10*3/uL (ref 1.7–7.7)
NEUTROS PCT: 73 %

## 2018-06-30 LAB — PROTIME-INR
INR: 1.15
Prothrombin Time: 14.6 seconds (ref 11.4–15.2)

## 2018-06-30 SURGERY — IR WITH ANESTHESIA
Anesthesia: General

## 2018-06-30 MED ORDER — ASPIRIN 325 MG PO TABS
ORAL_TABLET | ORAL | Status: AC
  Filled 2018-06-30: qty 1

## 2018-06-30 MED ORDER — ACETAMINOPHEN 160 MG/5ML PO SOLN: 650.0000 mg | ORAL | Status: DC | PRN

## 2018-06-30 MED ORDER — CLEVIDIPINE BUTYRATE 0.5 MG/ML IV EMUL
INTRAVENOUS | Status: AC
  Filled 2018-06-30: qty 50

## 2018-06-30 MED ORDER — CEFAZOLIN SODIUM-DEXTROSE 2-3 GM-%(50ML) IV SOLR
INTRAVENOUS | Status: DC | PRN
  Administered 2018-06-30: 2 g via INTRAVENOUS

## 2018-06-30 MED ORDER — LABETALOL HCL 5 MG/ML IV SOLN
INTRAVENOUS | Status: DC | PRN
  Administered 2018-06-30: 5 mg via INTRAVENOUS
  Administered 2018-06-30 (×2): 10 mg via INTRAVENOUS
  Administered 2018-06-30: 5 mg via INTRAVENOUS

## 2018-06-30 MED ORDER — SODIUM CHLORIDE 0.9 % IV SOLN
INTRAVENOUS | Status: DC | PRN
  Administered 2018-06-30: 25 ug/min via INTRAVENOUS

## 2018-06-30 MED ORDER — TIROFIBAN HCL IN NACL 5-0.9 MG/100ML-% IV SOLN
INTRAVENOUS | Status: AC
  Filled 2018-06-30: qty 100

## 2018-06-30 MED ORDER — TICAGRELOR 90 MG PO TABS
ORAL_TABLET | ORAL | Status: AC
  Filled 2018-06-30: qty 2

## 2018-06-30 MED ORDER — SODIUM CHLORIDE 0.9 % IV SOLN
INTRAVENOUS | Status: DC | PRN
  Administered 2018-06-30 (×2): via INTRAVENOUS

## 2018-06-30 MED ORDER — PROPOFOL 10 MG/ML IV BOLUS
INTRAVENOUS | Status: DC | PRN
  Administered 2018-06-30 (×2): 100 mg via INTRAVENOUS

## 2018-06-30 MED ORDER — CLEVIDIPINE BUTYRATE 0.5 MG/ML IV EMUL
0.0000 mg/h | INTRAVENOUS | Status: DC
  Administered 2018-06-30 – 2018-07-01 (×2): 5 mg/h via INTRAVENOUS
  Administered 2018-07-01: 7 mg/h via INTRAVENOUS
  Filled 2018-06-30 (×4): qty 50

## 2018-06-30 MED ORDER — DEXAMETHASONE SODIUM PHOSPHATE 10 MG/ML IJ SOLN
INTRAMUSCULAR | Status: DC | PRN
  Administered 2018-06-30: 5 mg via INTRAVENOUS

## 2018-06-30 MED ORDER — ACETAMINOPHEN 650 MG RE SUPP: 650.0000 mg | RECTAL | Status: DC | PRN

## 2018-06-30 MED ORDER — SODIUM CHLORIDE 0.9 % IV SOLN
INTRAVENOUS | Status: DC
  Administered 2018-07-01 – 2018-07-07 (×7): via INTRAVENOUS

## 2018-06-30 MED ORDER — CLOPIDOGREL BISULFATE 300 MG PO TABS
ORAL_TABLET | ORAL | Status: AC
  Filled 2018-06-30: qty 1

## 2018-06-30 MED ORDER — SENNOSIDES-DOCUSATE SODIUM 8.6-50 MG PO TABS: 1.0000 | ORAL_TABLET | Freq: Every evening | ORAL | Status: DC | PRN

## 2018-06-30 MED ORDER — SUCCINYLCHOLINE CHLORIDE 20 MG/ML IJ SOLN
INTRAMUSCULAR | Status: DC | PRN
  Administered 2018-06-30 (×2): 120 mg via INTRAVENOUS

## 2018-06-30 MED ORDER — STROKE: EARLY STAGES OF RECOVERY BOOK
Freq: Once | Status: AC
  Administered 2018-06-30
  Filled 2018-06-30: qty 1

## 2018-06-30 MED ORDER — ROCURONIUM BROMIDE 10 MG/ML (PF) SYRINGE
PREFILLED_SYRINGE | INTRAVENOUS | Status: DC | PRN
  Administered 2018-06-30: 50 mg via INTRAVENOUS

## 2018-06-30 MED ORDER — ONDANSETRON HCL 4 MG/2ML IJ SOLN
INTRAMUSCULAR | Status: DC | PRN
  Administered 2018-06-30: 4 mg via INTRAVENOUS

## 2018-06-30 MED ORDER — ACETAMINOPHEN 325 MG PO TABS: 650.0000 mg | ORAL_TABLET | ORAL | Status: DC | PRN

## 2018-06-30 MED ORDER — CEFAZOLIN SODIUM-DEXTROSE 2-4 GM/100ML-% IV SOLN
INTRAVENOUS | Status: AC
  Filled 2018-06-30: qty 100

## 2018-06-30 MED ORDER — SUGAMMADEX SODIUM 200 MG/2ML IV SOLN
INTRAVENOUS | Status: DC | PRN
  Administered 2018-06-30: 200 mg via INTRAVENOUS

## 2018-06-30 MED ORDER — ACETAMINOPHEN 160 MG/5ML PO SOLN
650.0000 mg | ORAL | Status: DC | PRN
  Administered 2018-07-06: 650 mg
  Filled 2018-06-30: qty 20.3

## 2018-06-30 MED ORDER — LIDOCAINE HCL (CARDIAC) PF 100 MG/5ML IV SOSY
PREFILLED_SYRINGE | INTRAVENOUS | Status: DC | PRN
  Administered 2018-06-30: 100 mg via INTRATRACHEAL

## 2018-06-30 MED ORDER — FENTANYL CITRATE (PF) 100 MCG/2ML IJ SOLN
INTRAMUSCULAR | Status: DC | PRN
  Administered 2018-06-30: 100 ug via INTRAVENOUS

## 2018-06-30 MED ORDER — SODIUM CHLORIDE 0.9 % IV SOLN
INTRAVENOUS | Status: DC
  Administered 2018-06-30: 23:00:00 via INTRAVENOUS

## 2018-06-30 MED ORDER — IOPAMIDOL (ISOVUE-370) INJECTION 76%
100.0000 mL | Freq: Once | INTRAVENOUS | Status: AC | PRN
  Administered 2018-06-30: 100 mL via INTRAVENOUS

## 2018-06-30 MED ORDER — LIDOCAINE 2% (20 MG/ML) 5 ML SYRINGE
INTRAMUSCULAR | Status: DC | PRN
  Administered 2018-06-30: 80 mg via INTRAVENOUS

## 2018-06-30 MED ORDER — LIDOCAINE HCL 1 % IJ SOLN
INTRAMUSCULAR | Status: AC
  Filled 2018-06-30: qty 20

## 2018-06-30 MED ORDER — FENTANYL CITRATE (PF) 100 MCG/2ML IJ SOLN
INTRAMUSCULAR | Status: AC
  Filled 2018-06-30: qty 2

## 2018-06-30 MED ORDER — EPTIFIBATIDE 20 MG/10ML IV SOLN
INTRAVENOUS | Status: AC
  Filled 2018-06-30: qty 10

## 2018-06-30 MED ORDER — NITROGLYCERIN 1 MG/10 ML FOR IR/CATH LAB
INTRA_ARTERIAL | Status: AC
  Filled 2018-06-30: qty 10

## 2018-06-30 NOTE — ED Provider Notes (Signed)
[facility] EMERGENCY DEPARTMENT Provider Note   CSN: [id number] Arrival date & time: 06/30/18  1803     History   Chief Complaint Chief Complaint  Patient presents with  . Code Stroke    HPI Ruth Sanchez is a 72 y.o. female.  72 yo F with a chief complaint of right-sided flaccidity and neglect.  This occurred just hours ago.  Patient arrived as a code stroke.  Level 5 caveat acuity of condition.  The history is provided by the patient.  Illness  This is a new problem. The problem occurs constantly. The problem has not changed since onset.Pertinent negatives include no chest pain, no headaches and no shortness of breath. Nothing aggravates the symptoms. Nothing relieves the symptoms. She has tried nothing for the symptoms. The treatment provided no relief.    Past Medical History:  Diagnosis Date  . Arthritis   . Asthma   . Cancer ([place])   . Hypertension     Patient Active Problem List   Diagnosis Date Noted  . Chronic diarrhea 03/29/2017  . Urinary incontinence 08/03/2015  . Essential hypertension 05/13/2015  . Chronic back pain 05/13/2015  . Acute bronchitis 02/23/2013  . Dyspnea 02/23/2013  . Paroxysmal atrial fibrillation ([place]) 02/23/2013  . Arthritis 02/23/2013  . Goiter with tracheal compression and mild stridor 02/23/2013  . Asthma, chronic 02/23/2013  . Hypokalemia 02/23/2013  . Multiple myeloma in remission ([place]) 11/18/2011    Past Surgical History:  Procedure Laterality Date  . APPENDECTOMY    . CHOLECYSTECTOMY    . ESOPHAGOGASTRODUODENOSCOPY N/A 11/24/2015   Procedure: ESOPHAGOGASTRODUODENOSCOPY (EGD);  Surgeon: Clarene Essex, MD;  Location: [facility];  Service: Endoscopy;  Laterality: N/A;  . TUBAL LIGATION       OB History   None      Home Medications    Prior to Admission medications   Medication Sig Start Date End Date Taking? Authorizing Provider  albuterol (PROVENTIL HFA;VENTOLIN HFA) 108 (90 Base) MCG/ACT inhaler  Inhale 2 puffs into the lungs every 4 (four) hours as needed for wheezing or shortness of breath. 06/24/18   [name], MD  amLODipine (NORVASC) 2.5 MG tablet Take 1 tablet (2.5 mg total) by mouth daily. 06/24/18   [name], MD  carvedilol (COREG) 12.5 MG tablet Take 1 tablet (12.5 mg total) by mouth 2 (two) times daily with a meal. 06/24/18   [name], MD  cetirizine (ZYRTEC) 10 MG tablet Take 1 tablet (10 mg total) by mouth daily. 06/24/18   [name], MD  diphenoxylate-atropine (LOMOTIL) 2.5-0.025 MG tablet TAKE 1 TABLET BY MOUTH 3 TIMES DAILY AS NEEDED FOR DIARRHEA OR loose stools Patient not taking: Reported on 12/17/2017 03/29/17   [name], MD  losartan (COZAAR) 100 MG tablet Take 1 tablet (100 mg total) by mouth daily. 06/24/18 07/24/18  [name], MD  mometasone-formoterol (DULERA) 100-5 MCG/ACT AERO Inhale 2 puffs into the lungs 2 (two) times daily. Patient not taking: Reported on 01/07/2018 12/17/17   [name], MD  oxybutynin (DITROPAN) 5 MG tablet Take 1 tablet (5 mg total) by mouth 2 (two) times daily. 03/24/18   [name], MD  tiZANidine (ZANAFLEX) 4 MG tablet Take 1 tablet (4 mg total) by mouth at bedtime. 06/24/18   [name], MD  traMADol (ULTRAM) 50 MG tablet Take 1 tablet (50 mg total) by mouth every 12 (twelve) hours as needed. 06/24/18   [name], MD    Family History Family History  Family  history unknown: Yes    Social History Social History   Tobacco Use  . Smoking status: Never Smoker  . Smokeless tobacco: Never Used  Substance Use Topics  . Alcohol use: No  . Drug use: No     Allergies   Gabapentin and Aspirin   Review of Systems Review of Systems  Unable to perform ROS: Acuity of condition  Constitutional: Negative for chills and fever.  HENT: Negative for congestion and rhinorrhea.   Eyes: Negative for redness and visual disturbance.  Respiratory: Negative for shortness of breath and wheezing.     Cardiovascular: Negative for chest pain and palpitations.  Gastrointestinal: Negative for nausea and vomiting.  Genitourinary: Negative for dysuria and urgency.  Musculoskeletal: Negative for arthralgias and myalgias.  Skin: Negative for pallor and wound.  Neurological: Negative for dizziness and headaches.     Physical Exam Updated Vital Signs There were no vitals taken for this visit.  Physical Exam  Constitutional: She appears well-developed and well-nourished. No distress.  HENT:  Head: Normocephalic and atraumatic.  Eyes: Pupils are equal, round, and reactive to light. EOM are normal.  Neck: Normal range of motion. Neck supple.  Cardiovascular: Normal rate and regular rhythm. Exam reveals no gallop and no friction rub.  No murmur heard. Pulmonary/Chest: Effort normal. She has no wheezes. She has no rales.  Abdominal: Soft. She exhibits no distension. There is no tenderness.  Musculoskeletal: She exhibits no edema or tenderness.  Neurological: She is alert.  Right sided paralysis, right sided neglect  Skin: Skin is warm and dry. She is not diaphoretic.  Psychiatric: She has a normal mood and affect. Her behavior is normal.  Nursing note and vitals reviewed.    ED Treatments / Results  Labs (all labs ordered are listed, but only abnormal results are displayed) Labs Reviewed  CBC - Abnormal; Notable for the following components:      Result Value   Platelets 125 (*)    All other components within normal limits  I-STAT CHEM 8, ED - Abnormal; Notable for the following components:   Potassium 2.9 (*)    Glucose, Bld 107 (*)    Calcium, Ion 1.14 (*)    All other components within normal limits  PROTIME-INR  APTT  DIFFERENTIAL  COMPREHENSIVE METABOLIC PANEL  I-STAT TROPONIN, ED  CBG MONITORING, ED    EKG EKG Interpretation  Date/Time:  Monday June 30 2018 18:26:57 EDT Ventricular Rate:  85 PR Interval:    QRS Duration: 92 QT Interval:  409 QTC  Calculation: 487 R Axis:   60 Text Interpretation:  Atrial fibrillation Borderline T abnormalities, diffuse leads Borderline prolonged QT interval No significant change since last tracing Confirmed by Deno Etienne [phone]) on 06/30/2018 6:48:53 PM   Radiology Ct Head Code Stroke Wo Contrast  Result Date: 06/30/2018 CLINICAL DATA:  Code stroke. Initial evaluation for acute right-sided gaze. EXAM: CT HEAD WITHOUT CONTRAST TECHNIQUE: Contiguous axial images were obtained from the base of the skull through the vertex without intravenous contrast. COMPARISON:  Prior CT from 11/24/2015. FINDINGS: Brain: Age-related cerebral atrophy with chronic small vessel ischemic disease. Remote lacunar infarct present within the right basal ganglia. Subtle hypodensity seen involving the left insular ribbon, consistent with evolving acute ischemic left MCA territory infarct. No acute intracranial hemorrhage. No mass lesion, midline shift or mass effect. No hydrocephalus. No extra-axial fluid collection. Vascular: No definite hyperdense vessel identified. Scattered vascular calcifications noted within the carotid siphons. Skull: Scalp soft tissues within normal limits.  Multiple lucent lesion seen throughout the calvarium, consistent with multiple myeloma. Sinuses/Orbits: Left-sided gaze noted. Paranasal sinuses and mastoid air cells are clear. Other: None. ASPECTS [facility] Stroke Program Early CT Score) - Ganglionic level infarction (caudate, lentiform nuclei, internal capsule, insula, M1-M3 cortex): 6 - Supraganglionic infarction (M4-M6 cortex): 3 Total score (0-10 with 10 being normal): 9 IMPRESSION: 1. Abnormal hypodensity involving the left insular cortex, suspicious for acute left MCA territory infarct. 2. ASPECTS is 9. These results were communicated to Dr. Rory Percy at 6:25 pmon 9/9/2019by text page via the [facility] messaging system. Electronically Signed   By: Jeannine Boga M.D.   On: 06/30/2018 18:25     Procedures Procedures (including critical care time)  Medications Ordered in ED Medications  tirofiban (AGGRASTAT) 5-0.9 MG/100ML-% injection (has no administration in time range)  ticagrelor (BRILINTA) 90 MG tablet (has no administration in time range)  aspirin 325 MG tablet (has no administration in time range)  clopidogrel (PLAVIX) 300 MG tablet (has no administration in time range)  lidocaine (XYLOCAINE) 1 % (with pres) injection (has no administration in time range)  nitroGLYCERIN 100 mcg/mL intra-arterial injection (has no administration in time range)  eptifibatide (INTEGRILIN) 20 MG/10ML injection (has no administration in time range)  fentaNYL (SUBLIMAZE) 100 MCG/2ML injection (has no administration in time range)  iopamidol (ISOVUE-370) 76 % injection 100 mL (100 mLs Intravenous Contrast Given 06/30/18 1822)     Initial Impression / Assessment and Plan / ED Course  I have reviewed the triage vital signs and the nursing notes.  Pertinent labs & imaging results that were available during my care of the patient were reviewed by me and considered in my medical decision making (see chart for details).     72 yo F with a chief complaint of right sided paralysis and neglect.  Concern for a large vessel occlusion.  Patient was taken urgently back to CT.  Decision was made to take her back to IR for intervention.  Admit to the neuro ICU.  CRITICAL CARE Performed by: Cecilio Asper   Total critical care time: 35 minutes  Critical care time was exclusive of separately billable procedures and treating other patients.  Critical care was necessary to treat or prevent imminent or life-threatening deterioration.  Critical care was time spent personally by me on the following activities: development of treatment plan with patient and/or surrogate as well as nursing, discussions with consultants, evaluation of patient's response to treatment, examination of patient, obtaining  history from patient or surrogate, ordering and performing treatments and interventions, ordering and review of laboratory studies, ordering and review of radiographic studies, pulse oximetry and re-evaluation of patient's condition.  The patients results and plan were reviewed and discussed.   Any x-rays performed were independently reviewed by myself.   Differential diagnosis were considered with the presenting HPI.  Medications  tirofiban (AGGRASTAT) 5-0.9 MG/100ML-% injection (has no administration in time range)  ticagrelor (BRILINTA) 90 MG tablet (has no administration in time range)  aspirin 325 MG tablet (has no administration in time range)  clopidogrel (PLAVIX) 300 MG tablet (has no administration in time range)  lidocaine (XYLOCAINE) 1 % (with pres) injection (has no administration in time range)  nitroGLYCERIN 100 mcg/mL intra-arterial injection (has no administration in time range)  eptifibatide (INTEGRILIN) 20 MG/10ML injection (has no administration in time range)  fentaNYL (SUBLIMAZE) 100 MCG/2ML injection (has no administration in time range)  iopamidol (ISOVUE-370) 76 % injection 100 mL (100 mLs Intravenous Contrast Given 06/30/18 1822)  There were no vitals filed for this visit.  Final diagnoses:  Stroke [facility])    Admission/ observation were discussed with the admitting physician, patient and/or family and they are comfortable with the plan.   Final Clinical Impressions(s) / ED Diagnoses   Final diagnoses:  Stroke [facility])    ED Discharge Orders    None       Deno Etienne, DO 06/30/18 [id number]

## 2018-06-30 NOTE — ED Notes (Signed)
Report given to IR staff

## 2018-06-30 NOTE — ED Notes (Signed)
Pt arrived to the bridge , airway cleared , pt to ct scan

## 2018-06-30 NOTE — ED Notes (Signed)
Pt transported to IR per Neuro MD

## 2018-06-30 NOTE — Progress Notes (Signed)
Patient ID: Ruth Sanchez, female   DOB: 05-Feb-1946, 72 y.o.   MRN: [id number] INR. 26 y RT F LSW  9am.. Sudden onsetof Lt gaze deviation,,aphasia and RT sided weakness. CT Brain Npo ICH. ASPECTS 9., CTA occluded distal Lt MCA / inferior division od LT MCA. CTP irreversible vol 29 ml  Versus penumbra of 98 ml ,with mismatch of 49ml. Ratio of 3.4. CT arch with severe tortuosity of Lt  CCA origin and proximally.  Endovascular option D/W son,power of attorney. Procedure,risks,reasons alternatives reviewed. Risks of ICH of 10 % ,worsening neuro deficit ,vent dependency,death,inability to revascularize vascular injury all reviewed. Questions answered to sons comprehension. Informed witnessed consent obtained.Marland Kitchen S.Denver Bentson.MD

## 2018-06-30 NOTE — Progress Notes (Signed)
Pt under the care of anesthesia. Intubated and sedated for procedure

## 2018-06-30 NOTE — Transfer of Care (Signed)
Immediate Anesthesia Transfer of Care Note  Patient: Ruth Sanchez  Procedure(s) Performed: IR WITH ANESTHESIA (N/A )  Patient Location: PACU  Anesthesia Type:General  Level of Consciousness: awake, alert , oriented and patient cooperative  Airway & Oxygen Therapy: Patient Spontanous Breathing and Patient connected to face mask oxygen  Post-op Assessment: Report given to RN, Post -op Vital signs reviewed and stable and minimal movement on left side, no movement on right side, left gaze, left pupil smaller than right. Dr. Estanislado Pandy aware.   Post vital signs: Reviewed and stable  Last Vitals:  Vitals Value Taken Time  BP 151/85 06/30/2018 10:30 PM  Temp    Pulse 63 06/30/2018 10:40 PM  Resp 14 06/30/2018 10:40 PM  SpO2 100 % 06/30/2018 10:40 PM  Vitals shown include unvalidated device data.  Last Pain:  Vitals:   06/30/18 1826  PainSc: 0-No pain         Complications: No apparent anesthesia complications

## 2018-06-30 NOTE — Anesthesia Preprocedure Evaluation (Signed)
Anesthesia Evaluation  Preop documentation limited or incomplete due to emergent nature of procedure.  Airway    Neck ROM: Limited    Dental  (+) Teeth Intact, Missing   Pulmonary     + decreased breath sounds  (-) rales    Cardiovascular hypertension,  Rhythm:Irregular Rate:Normal     Neuro/Psych    GI/Hepatic   Endo/Other    Renal/GU      Musculoskeletal   Abdominal   Peds  Hematology   Anesthesia Other Findings   Reproductive/Obstetrics                             Anesthesia Physical Anesthesia Plan  ASA: IV and emergent  Anesthesia Plan: General   Post-op Pain Management:    Induction: Intravenous, Rapid sequence and Cricoid pressure planned  PONV Risk Score and Plan: 3 and Ondansetron, Dexamethasone and Treatment may vary due to age or medical condition  Airway Management Planned: Oral ETT  Additional Equipment: Arterial line  Intra-op Plan:   Post-operative Plan: Possible Post-op intubation/ventilation  Informed Consent: I have reviewed the patients History and Physical, chart, labs and discussed the procedure including the risks, benefits and alternatives for the proposed anesthesia with the patient or authorized representative who has indicated his/her understanding and acceptance.   Dental advisory given  Plan Discussed with: CRNA  Anesthesia Plan Comments:         Anesthesia Quick Evaluation

## 2018-06-30 NOTE — Anesthesia Postprocedure Evaluation (Signed)
Anesthesia Post Note  Patient: Ruth Sanchez  Procedure(s) Performed: IR WITH ANESTHESIA (N/A )     Patient location during evaluation: PACU Anesthesia Type: General Level of consciousness: sedated and patient cooperative Pain management: pain level controlled Vital Signs Assessment: post-procedure vital signs reviewed and stable Respiratory status: spontaneous breathing Cardiovascular status: stable Anesthetic complications: no    Last Vitals:  Vitals:   06/30/18 1830  BP: (!) 153/87  Pulse: 73  Resp: 19  SpO2: 93%    Last Pain:  Vitals:   06/30/18 1826  PainSc: 0-No pain                 Nolon Nations

## 2018-06-30 NOTE — H&P (Addendum)
Neurology Consultation  CC: Aphasia, left gaze  History is obtained from: EMS, later from the family  HPI: Ruth Sanchez is a 72 y.o. female past medical history of hypertension, multiple myeloma under remission, last known normal at 9 AM today with sudden onset of leftward gaze and inability to talk. She was noted to be flaccid on the right side and gazing to the left by a caretaker and EMS was called. Upon arrival, there was no family accompanying the patient. The patient was seen and evaluated at the bridge of the emergency room and found to have left MCA syndrome. She was outside the window for IV TPA. She was within the window for endovascular thrombectomy and was taken for emergent CT and CTA head and neck and CT perfusion studies. CT of the head showed an aspects of 9 CTA head and neck showed a left M1/proximal M2 large vessel occlusion. Penumbra 98 cc/score 29 cc with a mismatch volume of 69 cc. Case discussed with endovascular-Dr. Estanislado Pandy and patient taken in for an emergent thrombectomy. Family arrived later and was updated on the treatment plan. They consented for the procedure and the patient is currently in IR.  Of note-atrial fibrillation was noted on the monitor.  LKW: 9 AM on 06/30/2018 tpa given?: no, outside the window Premorbid modified Rankin scale (mRS): 0-1  ROS:  Unable to obtain due to altered mental status.   Past Medical History:  Diagnosis Date  . Arthritis   . Asthma   . Cancer ([place])   . Hypertension     Family History  Family history unknown: Yes     Social History:   reports that she has never smoked. She has never used smokeless tobacco. She reports that she does not drink alcohol or use drugs.  Medications  Current Facility-Administered Medications:  .   stroke: mapping our early stages of recovery book, , Does not apply, Once, Amie Portland, MD .  0.9 %  sodium chloride infusion, , Intravenous, Continuous, Amie Portland, MD .   acetaminophen (TYLENOL) tablet 650 mg, 650 mg, Oral, Q4H PRN **OR** acetaminophen (TYLENOL) solution 650 mg, 650 mg, Per Tube, Q4H PRN **OR** acetaminophen (TYLENOL) suppository 650 mg, 650 mg, Rectal, Q4H PRN, Amie Portland, MD .  aspirin 325 MG tablet, , , ,  .  ceFAZolin (ANCEF) 2-4 GM/100ML-% IVPB, , , ,  .  clevidipine (CLEVIPREX) 0.5 MG/ML infusion, , , ,  .  clopidogrel (PLAVIX) 300 MG tablet, , , ,  .  eptifibatide (INTEGRILIN) 20 MG/10ML injection, , , ,  .  lidocaine (XYLOCAINE) 1 % (with pres) injection, , , ,  .  nitroGLYCERIN 100 mcg/mL intra-arterial injection, , , ,  .  senna-docusate (Senokot-S) tablet 1 tablet, 1 tablet, Oral, QHS PRN, Amie Portland, MD .  ticagrelor (BRILINTA) 90 MG tablet, , , ,  .  tirofiban (AGGRASTAT) 5-0.9 MG/100ML-% injection, , , ,   Current Outpatient Medications:  .  albuterol (PROVENTIL HFA;VENTOLIN HFA) 108 (90 Base) MCG/ACT inhaler, Inhale 2 puffs into the lungs every 4 (four) hours as needed for wheezing or shortness of breath., Disp: 1 Inhaler, Rfl: 3 .  amLODipine (NORVASC) 2.5 MG tablet, Take 1 tablet (2.5 mg total) by mouth daily., Disp: 30 tablet, Rfl: 3 .  carvedilol (COREG) 12.5 MG tablet, Take 1 tablet (12.5 mg total) by mouth 2 (two) times daily with a meal., Disp: 60 tablet, Rfl: 6 .  cetirizine (ZYRTEC) 10 MG tablet, Take 1 tablet (10 mg  total) by mouth daily., Disp: 30 tablet, Rfl: 2 .  diphenoxylate-atropine (LOMOTIL) 2.5-0.025 MG tablet, TAKE 1 TABLET BY MOUTH 3 TIMES DAILY AS NEEDED FOR DIARRHEA OR loose stools (Patient not taking: Reported on 12/17/2017), Disp: 40 tablet, Rfl: 1 .  losartan (COZAAR) 100 MG tablet, Take 1 tablet (100 mg total) by mouth daily., Disp: 30 tablet, Rfl: 6 .  mometasone-formoterol (DULERA) 100-5 MCG/ACT AERO, Inhale 2 puffs into the lungs 2 (two) times daily. (Patient not taking: Reported on 01/07/2018), Disp: 180 Inhaler, Rfl: 3 .  oxybutynin (DITROPAN) 5 MG tablet, Take 1 tablet (5 mg total) by mouth 2  (two) times daily., Disp: 60 tablet, Rfl: 6 .  tiZANidine (ZANAFLEX) 4 MG tablet, Take 1 tablet (4 mg total) by mouth at bedtime., Disp: 30 tablet, Rfl: 3 .  traMADol (ULTRAM) 50 MG tablet, Take 1 tablet (50 mg total) by mouth every 12 (twelve) hours as needed., Disp: 60 tablet, Rfl: 2  Facility-Administered Medications Ordered in Other Encounters:  .  0.9 %  sodium chloride infusion, , , Continuous PRN, Decarlo, Arther Dames, CRNA .  ceFAZolin (ANCEF) IVPB 2 g/50 mL premix, , Intravenous, Anesthesia Intra-op, Myna Bright, CRNA, 2 g at 06/30/18 1908 .  dexamethasone (DECADRON) injection, , , Anesthesia Intra-op, Myna Bright, CRNA, 5 mg at 06/30/18 1934 .  fentaNYL (SUBLIMAZE) injection, , , Anesthesia Intra-op, Myna Bright, CRNA, 100 mcg at 06/30/18 1902 .  labetalol (NORMODYNE,TRANDATE) injection, , , Anesthesia Intra-op, Myna Bright, CRNA, 5 mg at 06/30/18 1931 .  lidocaine (cardiac) 100 mg/93m (XYLOCAINE) injection 2%, , Tracheal Tube, Anesthesia Intra-op, DMyna Bright CRNA, 100 mg at 06/30/18 1853 .  lidocaine 2% (20 mg/mL) 5 mL syringe, , Intravenous, Anesthesia Intra-op, DMyna Bright CRNA, 80 mg at 06/30/18 1853 .  phenylephrine (NEO-SYNEPHRINE) 0.04 mg/mL in sodium chloride 0.9 % 250 mL infusion, , , Continuous PRN, DMyna Bright CRNA, Last Rate: 37.5 mL/hr at 06/30/18 2000, 25 mcg/min at 06/30/18 2000 .  propofol (DIPRIVAN) 10 mg/mL bolus/IV push, , , Anesthesia Intra-op, DMyna Bright CRNA, 100 mg at 06/30/18 1854 .  rocuronium bromide 10 mg/mL (PF) syringe, , Intravenous, Anesthesia Intra-op, DMyna Bright CRNA, 50 mg at 06/30/18 1907 .  succinylcholine (ANECTINE) injection, , , Anesthesia Intra-op, DMyna Bright CRNA, 120 mg at 06/30/18 1854 Exam: Current vital signs: BP (!) 153/87   Pulse 73   Resp 19   SpO2 93%  Vital signs in last 24 hours: Pulse Rate:  [73] 73 (09/09 1830) Resp:  [19] 19 (09/09 1830) BP: (153)/(87)  153/87 (09/09 1830) SpO2:  [93 %] 93 % (09/09 1830) General: Awake alert in no distress HEENT: Normocephalic atraumatic, dry mucous membrane's, no lymph adenopathy Lungs: Clear to auscultation bilaterally with no wheezes Cardiovascular: S1-S2 heard, regular rate rhythm Respiratory: S1-S2 heard regular rate rhythm Abdomen: Soft nondistended nontender Activities: Warm well perfused Neurological exam Patient is awake, not responsive to voice. She has no verbal output. Cranial nerves: Pupils are equal round reactive to light, forced left gaze deviation, does not blink to threat from the right, blinks to threat from the left, right lower facial weakness, unable to assess auditory acuity.  Unable to visualize palate that she does not follow commands. Motor exam: Flaccid right upper and lower extremity at 0/5.  Passively raised left upper and lower extremity-she is able to hold against gravity. Sensory exam: Lost sensation to noxious edematous on the right, withdraws to noxious stim on the  left and grimaces appropriately. Cannot test coordination or gait due to the mentation. NIHSS-ON ARRIVAL 1a Level of Conscious.: 0 1b LOC Questions: 2 1c LOC Commands: 2 2 Best Gaze: 2 3 Visual: 2 4 Facial Palsy: 2 5a Motor Arm - left: 0 5b Motor Arm - Right: 4 6a Motor Leg - Left: 0 6b Motor Leg - Right: 4 7 Limb Ataxia: 0 8 Sensory: 2 9 Best Language: 3 10 Dysarthria: 2 11 Extinct. and Inatten.: 1 TOTAL: 26  Labs I have reviewed labs in epic and the results pertinent to this consultation are:  CBC    Component Value Date/Time   WBC 8.4 06/30/2018 1812   RBC 4.31 06/30/2018 1812   HGB 12.5 06/30/2018 1812   HGB 12.5 04/08/2018 1354   HGB 13.8 05/28/2017 1141   HCT 40.3 06/30/2018 1812   HCT 42.0 05/28/2017 1141   PLT 125 (L) 06/30/2018 1812   PLT 153 04/08/2018 1354   PLT 163 05/28/2017 1141   MCV 93.5 06/30/2018 1812   MCV 93.3 05/28/2017 1141   MCH 29.0 06/30/2018 1812   MCHC 31.0  06/30/2018 1812   RDW 13.2 06/30/2018 1812   RDW 13.8 05/28/2017 1141   LYMPHSABS 1.7 06/30/2018 1812   LYMPHSABS 2.1 05/28/2017 1141   MONOABS 0.4 06/30/2018 1812   MONOABS 0.6 05/28/2017 1141   EOSABS 0.1 06/30/2018 1812   EOSABS 0.1 05/28/2017 1141   BASOSABS 0.0 06/30/2018 1812   BASOSABS 0.0 05/28/2017 1141  CMP     Component Value Date/Time   NA 142 06/30/2018 1812   NA 146 (H) 09/27/2017 1528   NA 143 05/28/2017 1141   K 3.0 (L) 06/30/2018 1812   K 3.5 05/28/2017 1141   CL 108 06/30/2018 1812   CL 104 01/19/2013 1058   CO2 25 06/30/2018 1812   CO2 28 05/28/2017 1141   GLUCOSE 108 (H) 06/30/2018 1812   GLUCOSE 133 05/28/2017 1141   GLUCOSE 81 01/19/2013 1058   BUN 12 06/30/2018 1812   BUN 15 09/27/2017 1528   BUN 10.4 05/28/2017 1141   CREATININE 0.78 06/30/2018 1812   CREATININE 0.78 04/08/2018 1354   CREATININE 0.8 05/28/2017 1141   CALCIUM 9.6 06/30/2018 1812   CALCIUM 9.7 05/28/2017 1141   PROT 7.7 06/30/2018 1812   PROT 7.4 09/27/2017 1528   PROT 7.6 05/28/2017 1141   ALBUMIN 3.8 06/30/2018 1812   ALBUMIN 4.2 09/27/2017 1528   ALBUMIN 3.6 05/28/2017 1141   AST 23 06/30/2018 1812   AST 19 04/08/2018 1354   AST 18 05/28/2017 1141   ALT 13 06/30/2018 1812   ALT 9 04/08/2018 1354   ALT 14 05/28/2017 1141   ALKPHOS 107 06/30/2018 1812   ALKPHOS 122 05/28/2017 1141   BILITOT 1.3 (H) 06/30/2018 1812   BILITOT 1.4 (H) 04/08/2018 1354   BILITOT 0.86 05/28/2017 1141   GFRNONAA >60 06/30/2018 1812   GFRNONAA >60 04/08/2018 1354   GFRNONAA 85 12/31/2016 0911   GFRAA >60 06/30/2018 1812   GFRAA >60 04/08/2018 1354   GFRAA >89 12/31/2016 0911  Imaging I have reviewed the images obtained: CT-scan of the brain-aspects 9 for early changes of acute ischemic stroke in  the left insula. CTA head and neck- left distal M1/proximal M2 occlusion.  CT perfusion- core 29 cc and penumbra 98 cc with mismatch volume of 69 cc.  Assessment:  72 year old woman with  hypertension and multiple myeloma that is in remission with acute onset of left MCA syndrome. There was  atrial fibrillation noted on the monitor-likely the cause of her acute ischemic stroke. She was outside the window for IV TPA but within the window for thrombectomy based on the DAWN/DEFUSE-3 criteria and is being taken for emergent thrombectomy  Impression: Acute ischemic stroke New found atrial fibrillation   Recommendations: Acute Ischemic Stroke Cerebral infarction due to embolism of left middle cerebral artery   Acuity: Acute Current Suspected Etiology: Continue Evaluation:  -Admit to: Neurological ICU -Aspirin per endovascular after the thrombectomy -Continue Statin -Blood pressure control, goal of SYS <140 if thrombectomy successful.  Otherwise allow for permissive hypertension. -MRI/ECHO/A1C/Lipid panel. -Hyperglycemia management per SSI to maintain glucose 140-197m/dL. -PT/OT/ST therapies and recommendations when able  CNS Cerebral edema -Close neuro monitoring  Dysarthria Dysphagia following cerebral infarction  -NPO until cleared by speech -ST -Advance diet as tolerated -May need PEG  Hemiplegia and hemiparesis following cerebral infarction affecting right dominant side -PT/OT -PM&R consult  RESP No acute issues on arrival Likely intubated for procedure Vent management per PCCM  CV Hypertensive encephalopathy Essential (primary) hypertension -Aggressive BP control, goal SBP <140 if thrombectomy successful otherwise allow for permissive hypertension. -TTE  Hyperlipidemia, unspecified  - Statin for goal LDL < 70  New atrial fibrillation -Rate control -Will need anticoagulant shin in the longer term.  Decision based on clinical course and imaging.  HEME No acute issues Monitor labs Transfuse for hemoglobin less than 7  ENDO No active issues Monitor labs Goal A1c less than 7 Check A1c  GI/GU -Gentle hydration  Fluid/Electrolyte  Disorders -Repeat labs -Replete as necessary  ID Possible Aspiration PNA -CXR -NPO -Monitor  Prophylaxis DVT: SCDs for now GI: Per vent bundle Bowel: Docusate/senna  Diet: NPO until cleared by speech  Code Status: Full Code for now.  If patient deteriorates, can contact family for further discussion on the CODE STATUS but not before tomorrow.   Son is the healthcare power of attorney I had a detailed discussion and updated him with the treatment plan.  THE FOLLOWING WERE PRESENT ON ADMISSION: -Acute ischemic stroke - Hemiplegia - Multiple myeloma -Possible aspiration pneumonia   -- AAmie Portland MD [facility] Pager: [phone] 7pm to 7am, please call on call as listed on AMION.   CRITICAL CARE ATTESTATION This patient is critically ill and at significant risk of neurological worsening, death and care requires constant monitoring of vital signs, hemodynamics,respiratory and cardiac monitoring. I spent 70  minutes of neurocritical care time performing neurological assessment, discussion with family, other specialists and medical decision making of high complexityin the care of  this patient.

## 2018-06-30 NOTE — Procedures (Signed)
S/P Lt common carotid arteriogram followed endovascular revascularization of occluded prox inf division with x 2 passes with 55mm x 33 mm embotrap retriever device with TICI 2 b reperfusion with subsequent reocclusion. Delayed angiographic evidence of attempt at  revascularization.

## 2018-06-30 NOTE — Progress Notes (Signed)
Patient ID: Ruth Sanchez, female   DOB: 1946-05-13, 72 y.o.   MRN: [id number] INR . Patient extubated .Maintaing O2 sat . Moving LT arm and leg spontaneously. Pupils approcx 35mm with RT slightly larger than the left.Both reactive  Spontaneously. RT groin soft. No hematoma. Distal pulses dopplerable DPs and PTs.bilaterally.  S.Mariama Saintvil MD

## 2018-06-30 NOTE — Anesthesia Procedure Notes (Signed)
Arterial Line Insertion Start/End9/06/2018 7:04 PM, 06/30/2018 7:04 PM Performed by: Suzy Bouchard, CRNA, CRNA  Preanesthetic checklist: patient identified, IV checked, site marked, risks and benefits discussed, surgical consent, monitors and equipment checked, pre-op evaluation, timeout performed and anesthesia consent radial was placed Catheter size: 20 G Hand hygiene performed  and maximum sterile barriers used   Attempts: 1 Procedure performed without using ultrasound guided technique. Following insertion, dressing applied and Biopatch. Patient tolerated the procedure well with no immediate complications.

## 2018-06-30 NOTE — ED Notes (Signed)
Pt is ct scan

## 2018-06-30 NOTE — ED Triage Notes (Signed)
Pt here as a code stroke lsn 0900 right side flaccid , pt will not look to the right and can not talk , cbg 96

## 2018-06-30 NOTE — Anesthesia Procedure Notes (Signed)
Procedure Name: Intubation Date/Time: 06/30/2018 6:55 PM Performed by: Myna Bright, CRNA Pre-anesthesia Checklist: Patient identified, Emergency Drugs available, Suction available and Patient being monitored Patient Re-evaluated:Patient Re-evaluated prior to induction Oxygen Delivery Method: Circle system utilized Preoxygenation: Pre-oxygenation with 100% oxygen Induction Type: IV induction, Rapid sequence and Cricoid Pressure applied Laryngoscope Size: Mac and 3 Grade View: Grade I Tube type: Oral Tube size: 7.0 mm Number of attempts: 1 Airway Equipment and Method: Stylet Placement Confirmation: ETT inserted through vocal cords under direct vision,  positive ETCO2 and breath sounds checked- equal and bilateral Secured at: 20 cm Tube secured with: Tape Dental Injury: Teeth and Oropharynx as per pre-operative assessment

## 2018-06-30 NOTE — Progress Notes (Signed)
Report given to Ezequiel Essex RN on call.

## 2018-07-01 ENCOUNTER — Telehealth: Payer: Self-pay | Admitting: Family Medicine

## 2018-07-01 ENCOUNTER — Encounter (HOSPITAL_COMMUNITY): Payer: Self-pay

## 2018-07-01 ENCOUNTER — Inpatient Hospital Stay (HOSPITAL_COMMUNITY): Payer: Medicare Other

## 2018-07-01 ENCOUNTER — Other Ambulatory Visit: Payer: Self-pay

## 2018-07-01 DIAGNOSIS — I6602 Occlusion and stenosis of left middle cerebral artery: Secondary | ICD-10-CM

## 2018-07-01 DIAGNOSIS — I340 Nonrheumatic mitral (valve) insufficiency: Secondary | ICD-10-CM

## 2018-07-01 DIAGNOSIS — I63412 Cerebral infarction due to embolism of left middle cerebral artery: Principal | ICD-10-CM

## 2018-07-01 DIAGNOSIS — I480 Paroxysmal atrial fibrillation: Secondary | ICD-10-CM

## 2018-07-01 DIAGNOSIS — I10 Essential (primary) hypertension: Secondary | ICD-10-CM

## 2018-07-01 LAB — BASIC METABOLIC PANEL
Anion gap: 13 (ref 5–15)
BUN: 6 mg/dL — ABNORMAL LOW (ref 8–23)
CHLORIDE: 108 mmol/L (ref 98–111)
CO2: 19 mmol/L — AB (ref 22–32)
CREATININE: 0.68 mg/dL (ref 0.44–1.00)
Calcium: 8.4 mg/dL — ABNORMAL LOW (ref 8.9–10.3)
GFR calc non Af Amer: >60 mL/min (ref >60)
GLUCOSE: 169 mg/dL — AB (ref 70–99)
Potassium: 2.8 mmol/L — ABNORMAL LOW (ref 3.5–5.1)
Sodium: 140 mmol/L (ref 135–145)

## 2018-07-01 LAB — GLUCOSE, CAPILLARY
GLUCOSE-CAPILLARY: 102 mg/dL — AB (ref 70–99)
Glucose-Capillary: 114 mg/dL — ABNORMAL HIGH (ref 70–99)
Glucose-Capillary: 114 mg/dL — ABNORMAL HIGH (ref 70–99)
Glucose-Capillary: 100 mg/dL — ABNORMAL HIGH (ref 70–99)

## 2018-07-01 LAB — LIPID PANEL
CHOL/HDL RATIO: 2.4 ratio
Cholesterol: 134 mg/dL (ref 0–200)
HDL: 57 mg/dL (ref >40)
LDL CALC: 62 mg/dL (ref 0–99)
Triglycerides: 74 mg/dL (ref <150)
VLDL: 15 mg/dL (ref 0–40)

## 2018-07-01 LAB — ECHOCARDIOGRAM COMPLETE
HEIGHTINCHES: 60 in
WEIGHTICAEL: 2380.97 [oz_av]

## 2018-07-01 LAB — HEMOGLOBIN A1C
Hgb A1c MFr Bld: 5.3 % (ref 4.8–5.6)
Mean Plasma Glucose: 105.41 mg/dL

## 2018-07-01 LAB — MRSA PCR SCREENING: MRSA by PCR: NEGATIVE

## 2018-07-01 MED ORDER — HEPARIN SODIUM (PORCINE) 5000 UNIT/ML IJ SOLN
5000.0000 [IU] | Freq: Three times a day (TID) | INTRAMUSCULAR | Status: DC
  Administered 2018-07-01 – 2018-07-07 (×17): 5000 [IU] via SUBCUTANEOUS
  Filled 2018-07-01 (×19): qty 1

## 2018-07-01 MED ORDER — FAMOTIDINE IN NACL 20-0.9 MG/50ML-% IV SOLN
20.0000 mg | Freq: Two times a day (BID) | INTRAVENOUS | Status: DC
  Administered 2018-07-01 – 2018-07-03 (×5): 20 mg via INTRAVENOUS
  Filled 2018-07-01 (×5): qty 50

## 2018-07-01 MED ORDER — INSULIN ASPART 100 UNIT/ML ~~LOC~~ SOLN
0.0000 [IU] | SUBCUTANEOUS | Status: DC
  Administered 2018-07-03 – 2018-07-08 (×14): 1 [IU] via SUBCUTANEOUS

## 2018-07-01 MED ORDER — ORAL CARE MOUTH RINSE
15.0000 mL | Freq: Two times a day (BID) | OROMUCOSAL | Status: DC
  Administered 2018-07-01 (×2): 15 mL via OROMUCOSAL

## 2018-07-01 MED ORDER — SODIUM CHLORIDE 0.9 % IV SOLN
INTRAVENOUS | Status: DC | PRN
  Administered 2018-07-01 (×2): 250 mL via INTRAVENOUS

## 2018-07-01 MED ORDER — POTASSIUM CHLORIDE 10 MEQ/100ML IV SOLN
10.0000 meq | INTRAVENOUS | Status: AC
  Administered 2018-07-01 (×6): 10 meq via INTRAVENOUS
  Filled 2018-07-01 (×6): qty 100

## 2018-07-01 NOTE — Evaluation (Signed)
Physical Therapy Evaluation Patient Details Name: Ruth Sanchez MRN: [id number] DOB: [birth year] Today's Date: 07/01/2018   History of Present Illness  Pt is a 72 y.o. female with PMH significant for HTN, arthritis, asthma, and cancer who presented with L gaze, aphasia, and R sided weakness. Pt is s/p proximal inferior MCA revascularization in IR 06/30/18.   Clinical Impression  Pt admitted with/for s/s of stroke with R sided weakness aphasia and L gaze.  Pt needing max assist of 2 persons for safety..  Pt currently limited functionally due to the problems listed. ( See problems list.)   Pt will benefit from PT to maximize function and safety in order to get ready for next venue listed below.     Follow Up Recommendations SNF    Equipment Recommendations  Other (comment)(TBA)    Recommendations for Other Services       Precautions / Restrictions Precautions Precautions: Fall Precaution Comments: R sided weakness; L gaze Restrictions Weight Bearing Restrictions: No      Mobility  Bed Mobility Overal bed mobility: Needs Assistance Bed Mobility: Rolling;Sidelying to Sit;Sit to Sidelying Rolling: Max assist Sidelying to sit: Max assist;+2 for physical assistance     Sit to sidelying: Max assist;+2 for physical assistance General bed mobility comments: Heavy assistance; but slightly attempting to assist once initiated.  Transfers Overall transfer level: Needs assistance Equipment used: 2 person hand held assist Transfers: Sit to/from Stand Sit to Stand: Max assist;+2 physical assistance         General transfer comment: Attempted sit<>stand x2 with max assist to power up and blocking bilateral knees. Once standing, pt with difficulty bending L knee to sit down.   Ambulation/Gait             General Gait Details: not able  Stairs            Wheelchair Mobility    Modified Rankin (Stroke Patients Only) Modified Rankin (Stroke Patients Only) Pre-Morbid  Rankin Score: No symptoms Modified Rankin: Severe disability     Balance Overall balance assessment: Needs assistance Sitting-balance support: No upper extremity supported;Feet supported;Single extremity supported Sitting balance-Leahy Scale: Poor Sitting balance - Comments: mod-max assist and pt heavily pushing to the R requiring placement of L hand on her lap     Standing balance-Leahy Scale: Zero Standing balance comment: Relies on assistance from staff.                              Pertinent Vitals/Pain Pain Assessment: Faces Faces Pain Scale: Hurts little more Pain Location: grimacing with repositioning and placing hand on lower back Pain Descriptors / Indicators: Sore Pain Intervention(s): Repositioned    Home Living Family/patient expects to be discharged to:: Skilled nursing facility Living Arrangements: Children(and grandchildren) Available Help at Discharge: Family Type of Home: House Home Access: Level entry     Home Layout: Two level;1/2 bath on main level(shower upstairs)        Prior Function Level of Independence: Independent         Comments: completing basic ADL and mobility without assistance     Hand Dominance   Dominant Hand: Right    Extremity/Trunk Assessment   Upper Extremity Assessment Upper Extremity Assessment: Defer to OT evaluation RUE Deficits / Details: No withdrawal to pain and no active movement. No increase in tone noted.     Lower Extremity Assessment Lower Extremity Assessment: RLE deficits/detail RLE Deficits / Details: Associated reactions  to L LE movement, no voluntary movement, no withdrawal to painful stimuli       Communication   Communication: No difficulties  Cognition Arousal/Alertness: Awake/alert Behavior During Therapy: Flat affect Overall Cognitive Status: Impaired/Different from baseline Area of Impairment: Attention;Following commands                   Current Attention Level:  Focused   Following Commands: Follows one step commands inconsistently       General Comments: Attempted to move RUE into lap and locate son on command but did not follow commands otherwise.       General Comments General comments (skin integrity, edema, etc.): son and daughter in law present    Exercises     Assessment/Plan    PT Assessment Patient needs continued PT services  PT Problem List Decreased strength;Decreased activity tolerance;Decreased balance;Decreased mobility;Decreased coordination;Decreased safety awareness;Impaired sensation       PT Treatment Interventions Gait training;Functional mobility training;Therapeutic activities;Balance training;Neuromuscular re-education;Patient/family education    PT Goals (Current goals can be found in the Care Plan section)  Acute Rehab PT Goals Patient Stated Goal: unable to state PT Goal Formulation: With patient/family Time For Goal Achievement: 07/15/18 Potential to Achieve Goals: Fair    Frequency Min 3X/week   Barriers to discharge        Co-evaluation PT/OT/SLP Co-Evaluation/Treatment: Yes Reason for Co-Treatment: Complexity of the patient's impairments (multi-system involvement) PT goals addressed during session: Mobility/safety with mobility OT goals addressed during session: ADL's and self-care;Strengthening/ROM       AM-PAC PT "6 Clicks" Daily Activity  Outcome Measure Difficulty turning over in bed (including adjusting bedclothes, sheets and blankets)?: Unable Difficulty moving from lying on back to sitting on the side of the bed? : Unable Difficulty sitting down on and standing up from a chair with arms (e.g., wheelchair, bedside commode, etc,.)?: Unable Help needed moving to and from a bed to chair (including a wheelchair)?: Total Help needed walking in hospital room?: Total Help needed climbing 3-5 steps with a railing? : Total 6 Click Score: 6    End of Session   Activity Tolerance: Patient  tolerated treatment well Patient left: in bed;with call bell/phone within reach;with family/visitor present Nurse Communication: Mobility status PT Visit Diagnosis: Muscle weakness (generalized) (M62.81);Hemiplegia and hemiparesis Hemiplegia - Right/Left: Right Hemiplegia - dominant/non-dominant: Dominant Hemiplegia - caused by: Cerebral infarction    Time: 6195-0932 PT Time Calculation (min) (ACUTE ONLY): 30 min   Charges:   PT Evaluation $PT Eval Moderate Complexity: 1 Mod          07/01/2018  Donnella Sham, PT Acute Rehabilitation Services [phone]  (pager) [phone]  (office)  Tessie Fass Briant Angelillo 07/01/2018, 6:22 PM

## 2018-07-01 NOTE — Plan of Care (Signed)
Pt failed swallow with speech therapy.  Will plan to place Cortrak tomorrow. Will continue to monitor.  Katherine Mantle RN

## 2018-07-01 NOTE — Progress Notes (Signed)
Neuro IR Right 9 french femoral sheath removed at 1025.  Sheath was intact.  Quarter sized Hematoma noted above sheath prior to removal.  VPAD and manual pressure applied to achieve hemostasis at 1100.  Distal pulses intact. No acute complications.  Site reviewed with Oceanographer.  Previously noted hematoma has resolved.  Pressure dressing applied. Ruth Sanchez

## 2018-07-01 NOTE — Evaluation (Signed)
Clinical/Bedside Swallow Evaluation Patient Details  Name: Ruth Sanchez MRN: [id number] Date of Birth: 05-11-1946  Today's Date: 07/01/2018 Time: SLP Start Time (ACUTE ONLY): 1500 SLP Stop Time (ACUTE ONLY): 1530 SLP Time Calculation (min) (ACUTE ONLY): 30 min  Past Medical History:  Past Medical History:  Diagnosis Date  . Arthritis   . Asthma   . Cancer ([place])   . Hypertension    Past Surgical History:  Past Surgical History:  Procedure Laterality Date  . APPENDECTOMY    . CHOLECYSTECTOMY    . ESOPHAGOGASTRODUODENOSCOPY N/A 11/24/2015   Procedure: ESOPHAGOGASTRODUODENOSCOPY (EGD);  Surgeon: Clarene Essex, MD;  Location: [facility];  Service: Endoscopy;  Laterality: N/A;  . RADIOLOGY WITH ANESTHESIA N/A 06/30/2018   Procedure: IR WITH ANESTHESIA;  Surgeon: Luanne Bras, MD;  Location: [facility];  Service: Radiology;  Laterality: N/A;  . TUBAL LIGATION     HPI:  72 year old female admitted 06/30/18 with stroke symptoms. PMH: arthritis, asthma, HTN. MRI pending.   Assessment / Plan / Recommendation Clinical Impression  BSE completed at bedside. Oral care completed with suction. Pt with adequate natural dentition, but is missing some teeth per son. Pt was given individual ice chips and small bolus of puree. No oral manipulation was noted. Swallow reflex is suspected to be significantly delayed. No cough response noted, however, silent aspiration cannot be detected at bedside. Oral cavity was suctioned following po trials, with removal of watery puree. Given lack of oral awareness and bolus manipulation, strict NPO status is recommended at this time, including administration of critical medication. RN and MD informed of results and recommendation. SLP will continue to follow for po readiness and language stimulation.    SLP Visit Diagnosis: Dysphagia, unspecified (R13.10)    Aspiration Risk  Severe aspiration risk;Risk for inadequate nutrition/hydration    Diet Recommendation NPO    Medication Administration: Via alternative means    Other  Recommendations Oral Care Recommendations: Staff/trained caregiver to provide oral care;Oral care QID Other Recommendations: Have oral suction available   Follow up Recommendations (TBD)      Frequency and Duration min 2x/week          Prognosis Prognosis for Safe Diet Advancement: Fair Barriers to Reach Goals: Language deficits;Severity of deficits      Swallow Study   General Date of Onset: 06/30/18 HPI: 72 year old female admitted 06/30/18 with stroke symptoms. PMH: arthritis, asthma, HTN. MRI pending. Type of Study: Bedside Swallow Evaluation Previous Swallow Assessment: none Diet Prior to this Study: NPO Temperature Spikes Noted: No Respiratory Status: Room air History of Recent Intubation: Yes Length of Intubations (days): 1 days Date extubated: 06/30/18 Behavior/Cognition: Alert;Doesn't follow directions Oral Cavity Assessment: Within Functional Limits Oral Care Completed by SLP: Yes Oral Cavity - Dentition: Adequate natural dentition;Missing dentition Self-Feeding Abilities: Total assist Patient Positioning: Upright in bed Baseline Vocal Quality: Aphonic Volitional Cough: Cognitively unable to elicit Volitional Swallow: Unable to elicit    Oral/Motor/Sensory Function Overall Oral Motor/Sensory Function: (unable to assess at this time)   Ice Chips Ice chips: Impaired Presentation: Spoon Oral Phase Impairments: Poor awareness of bolus Oral Phase Functional Implications: Prolonged oral transit;Oral holding Pharyngeal Phase Impairments: Suspected delayed Swallow;Decreased hyoid-laryngeal movement   Thin Liquid Thin Liquid: Not tested    Nectar Thick Nectar Thick Liquid: Not tested   Honey Thick Honey Thick Liquid: Not tested   Puree Puree: Impaired Presentation: Spoon Oral Phase Impairments: Poor awareness of bolus Oral Phase Functional Implications: Prolonged oral transit;Oral holding Pharyngeal  Phase Impairments: Suspected delayed Swallow;Decreased hyoid-laryngeal movement   Solid     Solid: Not tested     Ruth Sanchez B. Ruth Sanchez Ruth Sanchez, [place] Speech Language Pathologist [phone]  Ruth Sanchez 07/01/2018,3:34 PM

## 2018-07-01 NOTE — Progress Notes (Signed)
Referring Physician(s): CODE STROKE   Supervising Physician: Luanne Bras  Patient Status:  [facility] - In-pt  Chief Complaint: None  Subjective:  Proximal inferior MCA occlusion s/p revascularization 06/30/2018 with Dr. Estanislado Pandy. Patient awake and alert laying in bed. She demonstrates fixed left gaze deviation and gross aphasia. Can spontaneously move left side but no spontaneous movements of right side, positive Babinski bilaterally. Right groin incision c/d/i with sheath in place.   Allergies: Gabapentin and Aspirin   Medications: Prior to Admission medications   Medication Sig Start Date End Date Taking? Authorizing Provider  albuterol (PROVENTIL HFA;VENTOLIN HFA) 108 (90 Base) MCG/ACT inhaler Inhale 2 puffs into the lungs every 4 (four) hours as needed for wheezing or shortness of breath. 06/24/18   [name], MD  amLODipine (NORVASC) 2.5 MG tablet Take 1 tablet (2.5 mg total) by mouth daily. 06/24/18   [name], MD  carvedilol (COREG) 12.5 MG tablet Take 1 tablet (12.5 mg total) by mouth 2 (two) times daily with a meal. 06/24/18   [name], MD  cetirizine (ZYRTEC) 10 MG tablet Take 1 tablet (10 mg total) by mouth daily. 06/24/18   [name], MD  diphenoxylate-atropine (LOMOTIL) 2.5-0.025 MG tablet TAKE 1 TABLET BY MOUTH 3 TIMES DAILY AS NEEDED FOR DIARRHEA OR loose stools Patient not taking: Reported on 12/17/2017 03/29/17   [name], MD  losartan (COZAAR) 100 MG tablet Take 1 tablet (100 mg total) by mouth daily. 06/24/18 07/24/18  [name], MD  mometasone-formoterol (DULERA) 100-5 MCG/ACT AERO Inhale 2 puffs into the lungs 2 (two) times daily. Patient not taking: Reported on 01/07/2018 12/17/17   [name], MD  oxybutynin (DITROPAN) 5 MG tablet Take 1 tablet (5 mg total) by mouth 2 (two) times daily. 03/24/18   [name], MD  tiZANidine (ZANAFLEX) 4 MG tablet Take 1 tablet (4 mg total) by mouth at bedtime. 06/24/18   [name], MD    traMADol (ULTRAM) 50 MG tablet Take 1 tablet (50 mg total) by mouth every 12 (twelve) hours as needed. 06/24/18   [name], MD     Vital Signs: BP 130/86   Pulse 79   Temp 97.6 F (36.4 C) (Axillary)   Resp 20   Ht 5' (1.524 m)   Wt 148 lb 13 oz (67.5 kg)   SpO2 97%   BMI 29.06 kg/m   Physical Exam  Constitutional: She appears well-developed and well-nourished. No distress.  Pulmonary/Chest: Effort normal. No respiratory distress.  Neurological:  Alert and awake. Gross aphasia. PERRL bilaterally. Demonstrates fixed left gaze deviation. Visual fields not assessed. No facial asymmetry. Can spontaneously move left side but no spontaneous movements of right side, positive Babinski bilaterally. Pronator drift not assessed. Fine motor and coordination not assessed. Gait not assessed. Romberg not assessed. Heel to toe not assessed. Distal pulses 2+ bilaterally.  Skin: Skin is warm and dry.  Right groin incision soft with sheath in place, no active bleeding or hematoma.  Psychiatric: She has a normal mood and affect. Her behavior is normal. Judgment and thought content normal.    Imaging: Ct Angio Head W Or Wo Contrast  Result Date: 06/30/2018 CLINICAL DATA:  Initial evaluation for acute stroke. EXAM: CT ANGIOGRAPHY HEAD AND NECK CT PERFUSION BRAIN TECHNIQUE: Multidetector CT imaging of the head and neck was performed using the standard protocol during bolus administration of intravenous contrast. Multiplanar CT image reconstructions and MIPs were obtained to evaluate the vascular anatomy. Carotid stenosis measurements (when applicable) are obtained  utilizing NASCET criteria, using the distal internal carotid diameter as the denominator. Multiphase CT imaging of the brain was performed following IV bolus contrast injection. Subsequent parametric perfusion maps were calculated using RAPID software. CONTRAST:  143m ISOVUE-370 IOPAMIDOL (ISOVUE-370) INJECTION 76% COMPARISON:   Prior noncontrast CT from earlier the same day. FINDINGS: CTA NECK FINDINGS Aortic arch: Visualized aortic arch of normal caliber with normal branch pattern. No hemodynamically significant stenosis about the origin of the great vessels. Visualized subclavian arteries widely patent. Right carotid system: Right common carotid artery tortuous proximally but widely patent to the bifurcation. Mild atheromatous plaque about the proximal right ICA without stenosis. Right ICA widely patent distally to the skull base without stenosis, dissection, or occlusion Left carotid system: Left common carotid artery tortuous proximally but widely patent to the bifurcation without stenosis. Mild plaque about the left bifurcation without hemodynamically significant stenosis. Left ICA patent distally to the skull base without stenosis, dissection, or occlusion. Vertebral arteries: Both of the vertebral arteries arise from the subclavian arteries. Vertebral arteries widely patent within the neck without stenosis, dissection, or occlusion. Skeleton: Innumerable lytic lesions seen diffusely throughout the visualized spine, compatible with history of multiple myeloma. No acute osseous abnormality. Other neck: Soft tissues of the neck demonstrate no acute finding. Enlarged goiter is change of the left lobe of thyroid with substernal extension. Upper chest: Visualized upper chest within normal limits. Partially visualized lungs are clear. Review of the MIP images confirms the above findings CTA HEAD FINDINGS Anterior circulation: Internal carotid arteries patent to the termini without hemodynamically significant stenosis. A1 segments patent. Anterior communicating artery normal. Anterior cerebral arteries patent to their distal aspects. Left M1 patent proximally. There is prepped occlusion of the distal left M1/proximal M2 at the left MCA bifurcation (series 9, image 96). Attenuated and reduced flow seen distally within left MCA branches.  Right M1 patent. Normal right MCA bifurcation. Distal right MCA branches well perfused. Posterior circulation: Vertebral arteries diminutive but patent to the vertebrobasilar junction without stenosis. Left PICA patent. Right PICA not well visualized. Basilar diminutive but patent to its distal aspect without stenosis. Superior cerebral arteries patent proximally. Fetal type origin of the left PCA. Hypoplastic right P1 with robust right posterior communicating artery. PCAs patent to their distal aspects. Venous sinuses: Grossly patent, although not well assessed due to timing of the contrast bolus. Anatomic variants: Predominant fetal type origin of the PCAs. 4 mm focal outpouching arising from the right MCA bifurcation, suspicious for aneurysm. Delayed phase: Not performed. Review of the MIP images confirms the above findings CT Brain Perfusion Findings: CBF (<30%) Volume: 243mPerfusion (Tmax>6.0s) volume: 9839mismatch Volume: 45m40mfarction Location:Anterior left MCA distribution, involving the left insular region and operculum. IMPRESSION: 1. Positive CTA with EVLO of distal left M1/proximal M2 branch at the base of the sylvian fissure. Acute left MCA territory infarct with surrounding penumbra as above. 2. Additional mild atheromatous change throughout the major arterial vasculature of the head and neck as above. No other hemodynamically significant or correctable identified. 3. 4 mm right MCA bifurcation aneurysm. 4. Innumerable lucent lesions throughout the visualized skeleton, compatible with multiple myeloma. Findings discussed with Dr. ArorRory Percyapproximately 5:45 p.m. on 06/30/2018. Electronically Signed   By: BenjJeannine Boga.   On: 06/30/2018 19:21   Ct Angio Neck W Or Wo Contrast  Result Date: 06/30/2018 CLINICAL DATA:  Initial evaluation for acute stroke. EXAM: CT ANGIOGRAPHY HEAD AND NECK CT PERFUSION BRAIN TECHNIQUE: Multidetector CT imaging of the  head and neck was performed using the  standard protocol during bolus administration of intravenous contrast. Multiplanar CT image reconstructions and MIPs were obtained to evaluate the vascular anatomy. Carotid stenosis measurements (when applicable) are obtained utilizing NASCET criteria, using the distal internal carotid diameter as the denominator. Multiphase CT imaging of the brain was performed following IV bolus contrast injection. Subsequent parametric perfusion maps were calculated using RAPID software. CONTRAST:  176m ISOVUE-370 IOPAMIDOL (ISOVUE-370) INJECTION 76% COMPARISON:  Prior noncontrast CT from earlier the same day. FINDINGS: CTA NECK FINDINGS Aortic arch: Visualized aortic arch of normal caliber with normal branch pattern. No hemodynamically significant stenosis about the origin of the great vessels. Visualized subclavian arteries widely patent. Right carotid system: Right common carotid artery tortuous proximally but widely patent to the bifurcation. Mild atheromatous plaque about the proximal right ICA without stenosis. Right ICA widely patent distally to the skull base without stenosis, dissection, or occlusion Left carotid system: Left common carotid artery tortuous proximally but widely patent to the bifurcation without stenosis. Mild plaque about the left bifurcation without hemodynamically significant stenosis. Left ICA patent distally to the skull base without stenosis, dissection, or occlusion. Vertebral arteries: Both of the vertebral arteries arise from the subclavian arteries. Vertebral arteries widely patent within the neck without stenosis, dissection, or occlusion. Skeleton: Innumerable lytic lesions seen diffusely throughout the visualized spine, compatible with history of multiple myeloma. No acute osseous abnormality. Other neck: Soft tissues of the neck demonstrate no acute finding. Enlarged goiter is change of the left lobe of thyroid with substernal extension. Upper chest: Visualized upper chest within normal  limits. Partially visualized lungs are clear. Review of the MIP images confirms the above findings CTA HEAD FINDINGS Anterior circulation: Internal carotid arteries patent to the termini without hemodynamically significant stenosis. A1 segments patent. Anterior communicating artery normal. Anterior cerebral arteries patent to their distal aspects. Left M1 patent proximally. There is prepped occlusion of the distal left M1/proximal M2 at the left MCA bifurcation (series 9, image 96). Attenuated and reduced flow seen distally within left MCA branches. Right M1 patent. Normal right MCA bifurcation. Distal right MCA branches well perfused. Posterior circulation: Vertebral arteries diminutive but patent to the vertebrobasilar junction without stenosis. Left PICA patent. Right PICA not well visualized. Basilar diminutive but patent to its distal aspect without stenosis. Superior cerebral arteries patent proximally. Fetal type origin of the left PCA. Hypoplastic right P1 with robust right posterior communicating artery. PCAs patent to their distal aspects. Venous sinuses: Grossly patent, although not well assessed due to timing of the contrast bolus. Anatomic variants: Predominant fetal type origin of the PCAs. 4 mm focal outpouching arising from the right MCA bifurcation, suspicious for aneurysm. Delayed phase: Not performed. Review of the MIP images confirms the above findings CT Brain Perfusion Findings: CBF (<30%) Volume: 266mPerfusion (Tmax>6.0s) volume: 9827mismatch Volume: 5m54mfarction Location:Anterior left MCA distribution, involving the left insular region and operculum. IMPRESSION: 1. Positive CTA with EVLO of distal left M1/proximal M2 branch at the base of the sylvian fissure. Acute left MCA territory infarct with surrounding penumbra as above. 2. Additional mild atheromatous change throughout the major arterial vasculature of the head and neck as above. No other hemodynamically significant or correctable  identified. 3. 4 mm right MCA bifurcation aneurysm. 4. Innumerable lucent lesions throughout the visualized skeleton, compatible with multiple myeloma. Findings discussed with Dr. ArorRory Percyapproximately 5:45 p.m. on 06/30/2018. Electronically Signed   By: BenjJeannine Boga.   On: 06/30/2018 19:21  Ct Cerebral Perfusion W Contrast  Result Date: 06/30/2018 CLINICAL DATA:  Initial evaluation for acute stroke. EXAM: CT ANGIOGRAPHY HEAD AND NECK CT PERFUSION BRAIN TECHNIQUE: Multidetector CT imaging of the head and neck was performed using the standard protocol during bolus administration of intravenous contrast. Multiplanar CT image reconstructions and MIPs were obtained to evaluate the vascular anatomy. Carotid stenosis measurements (when applicable) are obtained utilizing NASCET criteria, using the distal internal carotid diameter as the denominator. Multiphase CT imaging of the brain was performed following IV bolus contrast injection. Subsequent parametric perfusion maps were calculated using RAPID software. CONTRAST:  178m ISOVUE-370 IOPAMIDOL (ISOVUE-370) INJECTION 76% COMPARISON:  Prior noncontrast CT from earlier the same day. FINDINGS: CTA NECK FINDINGS Aortic arch: Visualized aortic arch of normal caliber with normal branch pattern. No hemodynamically significant stenosis about the origin of the great vessels. Visualized subclavian arteries widely patent. Right carotid system: Right common carotid artery tortuous proximally but widely patent to the bifurcation. Mild atheromatous plaque about the proximal right ICA without stenosis. Right ICA widely patent distally to the skull base without stenosis, dissection, or occlusion Left carotid system: Left common carotid artery tortuous proximally but widely patent to the bifurcation without stenosis. Mild plaque about the left bifurcation without hemodynamically significant stenosis. Left ICA patent distally to the skull base without stenosis,  dissection, or occlusion. Vertebral arteries: Both of the vertebral arteries arise from the subclavian arteries. Vertebral arteries widely patent within the neck without stenosis, dissection, or occlusion. Skeleton: Innumerable lytic lesions seen diffusely throughout the visualized spine, compatible with history of multiple myeloma. No acute osseous abnormality. Other neck: Soft tissues of the neck demonstrate no acute finding. Enlarged goiter is change of the left lobe of thyroid with substernal extension. Upper chest: Visualized upper chest within normal limits. Partially visualized lungs are clear. Review of the MIP images confirms the above findings CTA HEAD FINDINGS Anterior circulation: Internal carotid arteries patent to the termini without hemodynamically significant stenosis. A1 segments patent. Anterior communicating artery normal. Anterior cerebral arteries patent to their distal aspects. Left M1 patent proximally. There is prepped occlusion of the distal left M1/proximal M2 at the left MCA bifurcation (series 9, image 96). Attenuated and reduced flow seen distally within left MCA branches. Right M1 patent. Normal right MCA bifurcation. Distal right MCA branches well perfused. Posterior circulation: Vertebral arteries diminutive but patent to the vertebrobasilar junction without stenosis. Left PICA patent. Right PICA not well visualized. Basilar diminutive but patent to its distal aspect without stenosis. Superior cerebral arteries patent proximally. Fetal type origin of the left PCA. Hypoplastic right P1 with robust right posterior communicating artery. PCAs patent to their distal aspects. Venous sinuses: Grossly patent, although not well assessed due to timing of the contrast bolus. Anatomic variants: Predominant fetal type origin of the PCAs. 4 mm focal outpouching arising from the right MCA bifurcation, suspicious for aneurysm. Delayed phase: Not performed. Review of the MIP images confirms the above  findings CT Brain Perfusion Findings: CBF (<30%) Volume: 210mPerfusion (Tmax>6.0s) volume: 9882mismatch Volume: 54m45mfarction Location:Anterior left MCA distribution, involving the left insular region and operculum. IMPRESSION: 1. Positive CTA with EVLO of distal left M1/proximal M2 branch at the base of the sylvian fissure. Acute left MCA territory infarct with surrounding penumbra as above. 2. Additional mild atheromatous change throughout the major arterial vasculature of the head and neck as above. No other hemodynamically significant or correctable identified. 3. 4 mm right MCA bifurcation aneurysm. 4. Innumerable lucent lesions throughout the  visualized skeleton, compatible with multiple myeloma. Findings discussed with Dr. Rory Percy at approximately 5:45 p.m. on 06/30/2018. Electronically Signed   By: Jeannine Boga M.D.   On: 06/30/2018 19:21   Ct Head Code Stroke Wo Contrast  Result Date: 06/30/2018 CLINICAL DATA:  Code stroke. Initial evaluation for acute right-sided gaze. EXAM: CT HEAD WITHOUT CONTRAST TECHNIQUE: Contiguous axial images were obtained from the base of the skull through the vertex without intravenous contrast. COMPARISON:  Prior CT from 11/24/2015. FINDINGS: Brain: Age-related cerebral atrophy with chronic small vessel ischemic disease. Remote lacunar infarct present within the right basal ganglia. Subtle hypodensity seen involving the left insular ribbon, consistent with evolving acute ischemic left MCA territory infarct. No acute intracranial hemorrhage. No mass lesion, midline shift or mass effect. No hydrocephalus. No extra-axial fluid collection. Vascular: No definite hyperdense vessel identified. Scattered vascular calcifications noted within the carotid siphons. Skull: Scalp soft tissues within normal limits. Multiple lucent lesion seen throughout the calvarium, consistent with multiple myeloma. Sinuses/Orbits: Left-sided gaze noted. Paranasal sinuses and mastoid air cells  are clear. Other: None. ASPECTS [facility] Stroke Program Early CT Score) - Ganglionic level infarction (caudate, lentiform nuclei, internal capsule, insula, M1-M3 cortex): 6 - Supraganglionic infarction (M4-M6 cortex): 3 Total score (0-10 with 10 being normal): 9 IMPRESSION: 1. Abnormal hypodensity involving the left insular cortex, suspicious for acute left MCA territory infarct. 2. ASPECTS is 9. These results were communicated to Dr. Rory Percy at 6:25 pmon 9/9/2019by text page via the [facility] messaging system. Electronically Signed   By: Jeannine Boga M.D.   On: 06/30/2018 18:25    Labs:  CBC: Recent Labs    12/23/17 1615 04/08/18 1354 06/30/18 1808 06/30/18 1812  WBC 3.8* 8.2  --  8.4  HGB 13.1 12.5 12.9 12.5  HCT 40.0 38.0 38.0 40.3  PLT 97* 153  --  125*    COAGS: Recent Labs    06/30/18 1812  INR 1.15  APTT 31    BMP: Recent Labs    12/23/17 1615 04/08/18 1354 06/30/18 1808 06/30/18 1812 07/01/18 0503  NA 139 140 142 142 140  K 3.4* 3.4* 2.9* 3.0* 2.8*  CL 102 105 106 108 108  CO2 26 27  --  25 19*  GLUCOSE 90 79 107* 108* 169*  BUN '12 10 13 12 ' 6*  CALCIUM 9.1 9.8  --  9.6 8.4*  CREATININE 1.00 0.78 0.70 0.78 0.68  GFRNONAA 55* >60  --  >60 >60  GFRAA >60 >60  --  >60 >60    LIVER FUNCTION TESTS: Recent Labs    09/27/17 1528 12/23/17 1615 04/08/18 1354 06/30/18 1812  BILITOT 0.8 1.0 1.4* 1.3*  AST '23 26 19 23  ' ALT '17 16 9 13  ' ALKPHOS 118* 80 112 107  PROT 7.4 7.4 8.4* 7.7  ALBUMIN 4.2 3.5 4.1 3.8    Assessment and Plan:  Proximal inferior MCA occlusion s/p revascularization 06/30/2018 with Dr. Estanislado Pandy. Patient's condition stable- unable to move right side but positive Babinski bilaterally, gross aphasia, fixed left gaze deviation. Right groin incision stable with sheath in place- plan for sheath removal today. Appreciate and agree with neurology management. Please call IR with questions/conerns.   Electronically Signed: Earley Abide,  PA-C 07/01/2018, 9:24 AM   I spent a total of 15 Minutes at the the patient's bedside AND on the patient's hospital floor or unit, greater than 50% of which was counseling/coordinating care for proximal inferior MCA occlusion s/p revascularization.

## 2018-07-01 NOTE — Telephone Encounter (Signed)
Thanks for letting me know!

## 2018-07-01 NOTE — Progress Notes (Signed)
  Echocardiogram 2D Echocardiogram has been performed.  Ruth Sanchez G Ruth Sanchez 07/01/2018, 2:51 PM

## 2018-07-01 NOTE — Telephone Encounter (Signed)
Pt's Daughter came in to inform PCP that pt had a stroke and is in the intensive care unit at [place] room 16

## 2018-07-01 NOTE — Plan of Care (Signed)
MRI reviewed. It showed large left MCA infarct with petechial hemorrhagic conversion on MRI. However, the MRI SWI sequence was severely motor degraded, so the exact size of hemorrhagic conversion is hard to evaluate. Given her ASA allergy and currently no po access for plavix and higher risk of hemorrhagic transformation at this time, will hold off antiplatelet for now.   Rosalin Hawking, MD PhD Stroke Neurology 07/01/2018 9:26 PM

## 2018-07-01 NOTE — Evaluation (Signed)
Speech Language Pathology Evaluation Patient Details Name: Ruth Sanchez MRN: [id number] DOB: [birth year] Today's Date: 07/01/2018 Time: 1130-1150 SLP Time Calculation (min) (ACUTE ONLY): 20 min  Problem List:  Patient Active Problem List   Diagnosis Date Noted  . Acute ischemic stroke ([place]) 06/30/2018  . Middle cerebral artery embolism, left 06/30/2018  . Chronic diarrhea 03/29/2017  . Urinary incontinence 08/03/2015  . Essential hypertension 05/13/2015  . Chronic back pain 05/13/2015  . Acute bronchitis 02/23/2013  . Dyspnea 02/23/2013  . Paroxysmal atrial fibrillation ([place]) 02/23/2013  . Arthritis 02/23/2013  . Goiter with tracheal compression and mild stridor 02/23/2013  . Asthma, chronic 02/23/2013  . Hypokalemia 02/23/2013  . Multiple myeloma in remission ([place]) 11/18/2011   Past Medical History:  Past Medical History:  Diagnosis Date  . Arthritis   . Asthma   . Cancer ([place])   . Hypertension    Past Surgical History:  Past Surgical History:  Procedure Laterality Date  . APPENDECTOMY    . CHOLECYSTECTOMY    . ESOPHAGOGASTRODUODENOSCOPY N/A 11/24/2015   Procedure: ESOPHAGOGASTRODUODENOSCOPY (EGD);  Surgeon: Clarene Essex, MD;  Location: [facility];  Service: Endoscopy;  Laterality: N/A;  . TUBAL LIGATION     HPI:  72 year old female admitted 06/30/18 with stroke symptoms. PMH: arthritis, asthma, HTN. MRI pending.   Assessment / Plan / Recommendation Clinical Impression  Limited speech/language evaluation completed, as pt is nonvocal and not following commands even with max verbal and tactile stim. She is holding a picture of her husband (who died in 2017-10-29), and daughter in law reports she likes to look at the picture of his face.   Family reports pt lived with them prior to admit, and was independent with bathing and dressing. She was fully communicative. Family managed finances and meds, and provided all the cooking, cleaning, and transportation prior to admit.  SLP will follow acutely. BSE pending.    SLP Assessment  SLP Recommendation/Assessment: Patient needs continued Speech Language Pathology Services SLP Visit Diagnosis: Cognitive communication deficit (R41.841)    Follow Up Recommendations  (TBD)    Frequency and Duration min 2x/week  2 weeks      SLP Evaluation Cognition  Overall Cognitive Status: unable to assess given severity of receptive and expressive language deficits       Comprehension  Auditory Comprehension Overall Auditory Comprehension: Impaired - pt is unable to follow commands or answer questions.   Expression Verbal Expression Overall Verbal Expression: Impaired - pt is nonvocal at this time Dominant Hand: Right    Oral / Motor  Oral Motor/Sensory Function Overall Oral Motor/Sensory Function: (unable to assess) Motor Speech Overall Motor Speech: (unable to assess)   GO                   Lea Baine B. [facility], CCC-SLP Speech Language Pathologist [phone]  Shonna Chock 07/01/2018, 11:58 AM

## 2018-07-01 NOTE — Evaluation (Signed)
Occupational Therapy Evaluation Patient Details Name: Ruth Sanchez MRN: [id number] DOB: [birth year] Today's Date: 07/01/2018    History of Present Illness Pt is a 72 y.o. female with PMH significant for HTN, arthritis, asthma, and cancer who presented with L gaze, aphasia, and R sided weakness. Pt is s/p proximal inferior MCA revascularization in IR 06/30/18.    Clinical Impression   PTA, pt was living with her son and daughter-in-law and independent with basic ADL and mobility. She was able to heat up meals but family assisted with other IADL. She currently requires total assistance for ADL participation and max assist +2 for mobility tasks. She is limited by decreased sensation, strength, and awareness of R side of her body and visual field. She was able to track to 25% toward the R from L gaze but unable to sustain. She appears to demonstrate focused attention responding to the sound of her son's voice. Pt would benefit from continued OT services while admitted to improve independence and safety with ADL and functional mobility. Recommend SNF level rehabilitation post-acute D/C in order to maximize functional participation in ADL tasks prior to discharge home with family. OT will continue to follow while admitted.     Follow Up Recommendations  SNF;Supervision/Assistance - 24 hour    Equipment Recommendations  Other (comment)(defer to next venue of care)    Recommendations for Other Services       Precautions / Restrictions Precautions Precautions:  Fall Precaution Comments: R sided weakness; L gaze Restrictions Weight Bearing Restrictions: No      Mobility Bed Mobility Overal bed mobility: Needs Assistance Bed Mobility: Rolling;Sidelying to Sit;Sit to Sidelying Rolling: Max assist Sidelying to sit: Max assist;+2 for physical assistance     Sit to sidelying:  Max assist;+2 for physical assistance General bed mobility comments:  Heavy assistance; but slightly attempting to  assist once initiated.  Transfers Overall transfer level:  Needs assistance Equipment used: 2 person hand held assist Transfers:  Sit to/from Stand Sit to Stand:  Max assist;+2 physical assistance         General transfer comment: (P) Attempted sit<>stand x2 with max assist to power up and blocking bilateral knees. Once standing, pt with difficulty bending L knee to sit down.     Balance Overall balance assessment: Needs assistance Sitting-balance support: No upper extremity supported;Feet supported;Single extremity supported Sitting balance-Leahy Scale: Poor Sitting balance - Comments: mod-max assist and pt heavily pushing to the R requiring placement of L hand on her lap     Standing balance-Leahy Scale: Zero Standing balance comment: Relies on assistance from staff.                            ADL either performed or assessed with clinical judgement   ADL Overall ADL's : Needs assistance/impaired                                       General ADL Comments: Requiring total assistance for all ADL participation at this time.      Vision   Vision Assessment?: Yes Ocular Range of Motion: Restricted on the right Alignment/Gaze Preference: Gaze left;Head turned Tracking/Visual Pursuits: Impaired - to be further tested in functional context(unable to track past 25% from full L gaze; unable to sustain) Visual Fields: Impaired-to be further tested in functional context;Right visual field deficit Additional Comments: Pt  presenting with inability to track past 25% from full L gaze this session. She attempted to locate son but unable to sustain gaze.      Perception     Praxis      Pertinent Vitals/Pain Pain Assessment: Faces Faces Pain Scale: Hurts little more Pain Location: grimacing with repositioning and placing hand on lower back Pain Descriptors / Indicators: Sore Pain Intervention(s): Repositioned     Hand Dominance Right    Extremity/Trunk Assessment Upper Extremity Assessment Upper Extremity Assessment: RUE Deficits/Details  RUE Deficits / Details: No withdrawal to pain and no active movement. No increase in tone noted.    Lower Extremity Assessment Lower Extremity Assessment: RLE deficits/detail       Communication Communication Communication: No difficulties   Cognition Arousal/Alertness: Awake/alert Behavior During Therapy: Flat affect Overall Cognitive Status: Impaired/Different from baseline Area of Impairment: Attention;Following commands                   Current Attention Level: Focused   Following Commands:Follows one step commands inconsistently       General Comments: Attempted to move RUE into lap and locate son on command but did not follow commands otherwise.    General Comments  Son and daughter-in-law present.     Exercises     Shoulder Instructions      Home Living Family/patient expects to be discharged to:: [place] Arrangements:Children(and grandchildren) Available Help at Discharge: (P) Family Type of Home: House Home Access: Level entry     Home Layout: Two level;1/2 bath on main level(shower upstairs)     Bathroom Shower/Tub: Teacher, early years/pre: Handicapped height                Prior Functioning/Environment Level of Independence: Independent        Comments:  completing basic ADL and mobility without assistance        OT Problem List: Decreased strength;Decreased range of motion;Decreased activity tolerance;Impaired balance (sitting and/or standing);Decreased safety awareness;Decreased knowledge of use of DME or AE;Decreased knowledge of precautions;Pain;Impaired UE functional use;Impaired vision/perception;Decreased coordination;Decreased cognition      OT Treatment/Interventions: Self-care/ADL training;Therapeutic exercise;Neuromuscular education;Energy conservation;DME and/or AE  instruction;Manual therapy;Splinting;Therapeutic activities;Cognitive remediation/compensation;Visual/perceptual remediation/compensation;Patient/family education;Balance training    OT Goals(Current goals can be found in the care plan section) Acute Rehab OT Goals Patient Stated Goal: (P) unable to state OT Goal Formulation: Patient unable to participate in goal setting Time For Goal Achievement: 07/15/18 Potential to Achieve Goals: Fair ADL Goals Pt Will Perform Grooming: with mod assist;sitting Additional ADL Goal #1: Pt will follow 2/3 one-step commands during seated grooming tasks. Additional ADL Goal #2: Pt will sustain midline gaze for 5 seconds to locate family member or preferred object. Additional ADL Goal #3: Pt will complete bed mobility in preparation for participation in ADL seated at EOB with overall moderate assistance.  OT Frequency: Min 3X/week   Barriers to D/C:            Co-evaluation PT/OT/SLP Co-Evaluation/Treatment: Yes Reason for Co-Treatment: (P) Complexity of the patient's impairments (multi-system involvement) PT goals addressed during session: (P) Mobility/safety with mobility OT goals addressed during session: ADL's and self-care;Strengthening/ROM      AM-PAC PT "6 Clicks" Daily Activity     Outcome Measure Help from another person eating meals?: Total Help from another person taking care of personal grooming?: Total Help from another person toileting, which includes using toliet, bedpan, or urinal?: Total Help from another person bathing (including washing,  rinsing, drying)?: Total Help from another person to put on and taking off regular upper body clothing?: Total Help from another person to put on and taking off regular lower body clothing?: Total 6 Click Score: 6   End of Session Nurse Communication: Mobility status(pt A-line bandage (from removal) bleeding)  Activity Tolerance: Patient tolerated treatment well Patient left: in bed;with call  bell/phone within reach;with family/visitor present  OT Visit Diagnosis: Other abnormalities of gait and mobility (R26.89);Hemiplegia and hemiparesis;Low vision, both eyes (H54.2) Hemiplegia - Right/Left: Right Hemiplegia - dominant/non-dominant: Dominant Hemiplegia - caused by: Cerebral infarction                Time: 6222-9798 OT Time Calculation (min): 29 min Charges:  OT General Charges $OT Visit: 1 Visit OT Evaluation $OT Eval Moderate Complexity: [place] Pager [phone] Office [phone]   Norman Herrlich 07/01/2018, 6:16 PM

## 2018-07-01 NOTE — Progress Notes (Addendum)
STROKE TEAM PROGRESS NOTE   SUBJECTIVE (INTERVAL HISTORY) Her son and daughter in law are at the bedside.  Overall she feels her condition is stable. She still has significant left MCA syndrome. IR unsuccessful yesterday. MRI and MRA pending. K+ was low and will supplement. Sheath removed, pending swallow eval.     OBJECTIVE Temp:  [97 F (36.1 C)-97.6 F (36.4 C)] 97.6 F (36.4 C) (09/10 0800) Pulse Rate:  [60-110] 100 (09/10 1100) Cardiac Rhythm: Atrial fibrillation (09/10 0800) Resp:  [12-40] 23 (09/10 1100) BP: (85-153)/(55-88) 119/62 (09/10 1100) SpO2:  [93 %-100 %] 94 % (09/10 1100) Arterial Line BP: (83-168)/(44-91) 134/62 (09/10 1100) Weight:  [67.5 kg] 67.5 kg (09/09 2345)  No results for input(s): GLUCAP in the last 168 hours. Recent Labs  Lab 06/30/18 1808 06/30/18 1812 07/01/18 0503  NA 142 142 140  K 2.9* 3.0* 2.8*  CL 106 108 108  CO2  --  25 19*  GLUCOSE 107* 108* 169*  BUN 13 12 6*  CREATININE 0.70 0.78 0.68  CALCIUM  --  9.6 8.4*   Recent Labs  Lab 06/30/18 1812  AST 23  ALT 13  ALKPHOS 107  BILITOT 1.3*  PROT 7.7  ALBUMIN 3.8   Recent Labs  Lab 06/30/18 1808 06/30/18 1812  WBC  --  8.4  NEUTROABS  --  6.2  HGB 12.9 12.5  HCT 38.0 40.3  MCV  --  93.5  PLT  --  125*   No results for input(s): CKTOTAL, CKMB, CKMBINDEX, TROPONINI in the last 168 hours. Recent Labs    06/30/18 1812  LABPROT 14.6  INR 1.15   No results for input(s): COLORURINE, LABSPEC, PHURINE, GLUCOSEU, HGBUR, BILIRUBINUR, KETONESUR, PROTEINUR, UROBILINOGEN, NITRITE, LEUKOCYTESUR in the last 72 hours.  Invalid input(s): APPERANCEUR     Component Value Date/Time   CHOL 134 07/01/2018 0503   TRIG 74 07/01/2018 0503   HDL 57 07/01/2018 0503   CHOLHDL 2.4 07/01/2018 0503   VLDL 15 07/01/2018 0503   LDLCALC 62 07/01/2018 0503   Lab Results  Component Value Date   HGBA1C 5.3 07/01/2018      Component Value Date/Time   COCAINSCRNUR Negative 01/14/2017 1215    No  results for input(s): ETH in the last 168 hours.  I have personally reviewed the radiological images below and agree with the radiology interpretations.  Ct Angio Head W Or Wo Contrast  Result Date: 06/30/2018 CLINICAL DATA:  Initial evaluation for acute stroke. EXAM: CT ANGIOGRAPHY HEAD AND NECK CT PERFUSION BRAIN TECHNIQUE: Multidetector CT imaging of the head and neck was performed using the standard protocol during bolus administration of intravenous contrast. Multiplanar CT image reconstructions and MIPs were obtained to evaluate the vascular anatomy. Carotid stenosis measurements (when applicable) are obtained utilizing NASCET criteria, using the distal internal carotid diameter as the denominator. Multiphase CT imaging of the brain was performed following IV bolus contrast injection. Subsequent parametric perfusion maps were calculated using RAPID software. CONTRAST:  179m ISOVUE-370 IOPAMIDOL (ISOVUE-370) INJECTION 76% COMPARISON:  Prior noncontrast CT from earlier the same day. FINDINGS: CTA NECK FINDINGS Aortic arch: Visualized aortic arch of normal caliber with normal branch pattern. No hemodynamically significant stenosis about the origin of the great vessels. Visualized subclavian arteries widely patent. Right carotid system: Right common carotid artery tortuous proximally but widely patent to the bifurcation. Mild atheromatous plaque about the proximal right ICA without stenosis. Right ICA widely patent distally to the skull base without stenosis, dissection, or occlusion Left  carotid system: Left common carotid artery tortuous proximally but widely patent to the bifurcation without stenosis. Mild plaque about the left bifurcation without hemodynamically significant stenosis. Left ICA patent distally to the skull base without stenosis, dissection, or occlusion. Vertebral arteries: Both of the vertebral arteries arise from the subclavian arteries. Vertebral arteries widely patent within the neck  without stenosis, dissection, or occlusion. Skeleton: Innumerable lytic lesions seen diffusely throughout the visualized spine, compatible with history of multiple myeloma. No acute osseous abnormality. Other neck: Soft tissues of the neck demonstrate no acute finding. Enlarged goiter is change of the left lobe of thyroid with substernal extension. Upper chest: Visualized upper chest within normal limits. Partially visualized lungs are clear. Review of the MIP images confirms the above findings CTA HEAD FINDINGS Anterior circulation: Internal carotid arteries patent to the termini without hemodynamically significant stenosis. A1 segments patent. Anterior communicating artery normal. Anterior cerebral arteries patent to their distal aspects. Left M1 patent proximally. There is prepped occlusion of the distal left M1/proximal M2 at the left MCA bifurcation (series 9, image 96). Attenuated and reduced flow seen distally within left MCA branches. Right M1 patent. Normal right MCA bifurcation. Distal right MCA branches well perfused. Posterior circulation: Vertebral arteries diminutive but patent to the vertebrobasilar junction without stenosis. Left PICA patent. Right PICA not well visualized. Basilar diminutive but patent to its distal aspect without stenosis. Superior cerebral arteries patent proximally. Fetal type origin of the left PCA. Hypoplastic right P1 with robust right posterior communicating artery. PCAs patent to their distal aspects. Venous sinuses: Grossly patent, although not well assessed due to timing of the contrast bolus. Anatomic variants: Predominant fetal type origin of the PCAs. 4 mm focal outpouching arising from the right MCA bifurcation, suspicious for aneurysm. Delayed phase: Not performed. Review of the MIP images confirms the above findings CT Brain Perfusion Findings: CBF (<30%) Volume: 18m Perfusion (Tmax>6.0s) volume: 953mMismatch Volume: 6936mnfarction Location:Anterior left MCA  distribution, involving the left insular region and operculum. IMPRESSION: 1. Positive CTA with EVLO of distal left M1/proximal M2 branch at the base of the sylvian fissure. Acute left MCA territory infarct with surrounding penumbra as above. 2. Additional mild atheromatous change throughout the major arterial vasculature of the head and neck as above. No other hemodynamically significant or correctable identified. 3. 4 mm right MCA bifurcation aneurysm. 4. Innumerable lucent lesions throughout the visualized skeleton, compatible with multiple myeloma. Findings discussed with Dr. AroRory Percy approximately 5:45 p.m. on 06/30/2018. Electronically Signed   By: BenJeannine BogaD.   On: 06/30/2018 19:21   Ct Angio Neck W Or Wo Contrast  Result Date: 06/30/2018 CLINICAL DATA:  Initial evaluation for acute stroke. EXAM: CT ANGIOGRAPHY HEAD AND NECK CT PERFUSION BRAIN TECHNIQUE: Multidetector CT imaging of the head and neck was performed using the standard protocol during bolus administration of intravenous contrast. Multiplanar CT image reconstructions and MIPs were obtained to evaluate the vascular anatomy. Carotid stenosis measurements (when applicable) are obtained utilizing NASCET criteria, using the distal internal carotid diameter as the denominator. Multiphase CT imaging of the brain was performed following IV bolus contrast injection. Subsequent parametric perfusion maps were calculated using RAPID software. CONTRAST:  100m67mOVUE-370 IOPAMIDOL (ISOVUE-370) INJECTION 76% COMPARISON:  Prior noncontrast CT from earlier the same day. FINDINGS: CTA NECK FINDINGS Aortic arch: Visualized aortic arch of normal caliber with normal branch pattern. No hemodynamically significant stenosis about the origin of the great vessels. Visualized subclavian arteries widely patent. Right carotid system: Right common  carotid artery tortuous proximally but widely patent to the bifurcation. Mild atheromatous plaque about the  proximal right ICA without stenosis. Right ICA widely patent distally to the skull base without stenosis, dissection, or occlusion Left carotid system: Left common carotid artery tortuous proximally but widely patent to the bifurcation without stenosis. Mild plaque about the left bifurcation without hemodynamically significant stenosis. Left ICA patent distally to the skull base without stenosis, dissection, or occlusion. Vertebral arteries: Both of the vertebral arteries arise from the subclavian arteries. Vertebral arteries widely patent within the neck without stenosis, dissection, or occlusion. Skeleton: Innumerable lytic lesions seen diffusely throughout the visualized spine, compatible with history of multiple myeloma. No acute osseous abnormality. Other neck: Soft tissues of the neck demonstrate no acute finding. Enlarged goiter is change of the left lobe of thyroid with substernal extension. Upper chest: Visualized upper chest within normal limits. Partially visualized lungs are clear. Review of the MIP images confirms the above findings CTA HEAD FINDINGS Anterior circulation: Internal carotid arteries patent to the termini without hemodynamically significant stenosis. A1 segments patent. Anterior communicating artery normal. Anterior cerebral arteries patent to their distal aspects. Left M1 patent proximally. There is prepped occlusion of the distal left M1/proximal M2 at the left MCA bifurcation (series 9, image 96). Attenuated and reduced flow seen distally within left MCA branches. Right M1 patent. Normal right MCA bifurcation. Distal right MCA branches well perfused. Posterior circulation: Vertebral arteries diminutive but patent to the vertebrobasilar junction without stenosis. Left PICA patent. Right PICA not well visualized. Basilar diminutive but patent to its distal aspect without stenosis. Superior cerebral arteries patent proximally. Fetal type origin of the left PCA. Hypoplastic right P1 with  robust right posterior communicating artery. PCAs patent to their distal aspects. Venous sinuses: Grossly patent, although not well assessed due to timing of the contrast bolus. Anatomic variants: Predominant fetal type origin of the PCAs. 4 mm focal outpouching arising from the right MCA bifurcation, suspicious for aneurysm. Delayed phase: Not performed. Review of the MIP images confirms the above findings CT Brain Perfusion Findings: CBF (<30%) Volume: 59m Perfusion (Tmax>6.0s) volume: 952mMismatch Volume: 6934mnfarction Location:Anterior left MCA distribution, involving the left insular region and operculum. IMPRESSION: 1. Positive CTA with EVLO of distal left M1/proximal M2 branch at the base of the sylvian fissure. Acute left MCA territory infarct with surrounding penumbra as above. 2. Additional mild atheromatous change throughout the major arterial vasculature of the head and neck as above. No other hemodynamically significant or correctable identified. 3. 4 mm right MCA bifurcation aneurysm. 4. Innumerable lucent lesions throughout the visualized skeleton, compatible with multiple myeloma. Findings discussed with Dr. AroRory Percy approximately 5:45 p.m. on 06/30/2018. Electronically Signed   By: BenJeannine BogaD.   On: 06/30/2018 19:21   Ct Cerebral Perfusion W Contrast  Result Date: 06/30/2018 CLINICAL DATA:  Initial evaluation for acute stroke. EXAM: CT ANGIOGRAPHY HEAD AND NECK CT PERFUSION BRAIN TECHNIQUE: Multidetector CT imaging of the head and neck was performed using the standard protocol during bolus administration of intravenous contrast. Multiplanar CT image reconstructions and MIPs were obtained to evaluate the vascular anatomy. Carotid stenosis measurements (when applicable) are obtained utilizing NASCET criteria, using the distal internal carotid diameter as the denominator. Multiphase CT imaging of the brain was performed following IV bolus contrast injection. Subsequent parametric  perfusion maps were calculated using RAPID software. CONTRAST:  100m36mOVUE-370 IOPAMIDOL (ISOVUE-370) INJECTION 76% COMPARISON:  Prior noncontrast CT from earlier the same day. FINDINGS: CTA NECK  FINDINGS Aortic arch: Visualized aortic arch of normal caliber with normal branch pattern. No hemodynamically significant stenosis about the origin of the great vessels. Visualized subclavian arteries widely patent. Right carotid system: Right common carotid artery tortuous proximally but widely patent to the bifurcation. Mild atheromatous plaque about the proximal right ICA without stenosis. Right ICA widely patent distally to the skull base without stenosis, dissection, or occlusion Left carotid system: Left common carotid artery tortuous proximally but widely patent to the bifurcation without stenosis. Mild plaque about the left bifurcation without hemodynamically significant stenosis. Left ICA patent distally to the skull base without stenosis, dissection, or occlusion. Vertebral arteries: Both of the vertebral arteries arise from the subclavian arteries. Vertebral arteries widely patent within the neck without stenosis, dissection, or occlusion. Skeleton: Innumerable lytic lesions seen diffusely throughout the visualized spine, compatible with history of multiple myeloma. No acute osseous abnormality. Other neck: Soft tissues of the neck demonstrate no acute finding. Enlarged goiter is change of the left lobe of thyroid with substernal extension. Upper chest: Visualized upper chest within normal limits. Partially visualized lungs are clear. Review of the MIP images confirms the above findings CTA HEAD FINDINGS Anterior circulation: Internal carotid arteries patent to the termini without hemodynamically significant stenosis. A1 segments patent. Anterior communicating artery normal. Anterior cerebral arteries patent to their distal aspects. Left M1 patent proximally. There is prepped occlusion of the distal left  M1/proximal M2 at the left MCA bifurcation (series 9, image 96). Attenuated and reduced flow seen distally within left MCA branches. Right M1 patent. Normal right MCA bifurcation. Distal right MCA branches well perfused. Posterior circulation: Vertebral arteries diminutive but patent to the vertebrobasilar junction without stenosis. Left PICA patent. Right PICA not well visualized. Basilar diminutive but patent to its distal aspect without stenosis. Superior cerebral arteries patent proximally. Fetal type origin of the left PCA. Hypoplastic right P1 with robust right posterior communicating artery. PCAs patent to their distal aspects. Venous sinuses: Grossly patent, although not well assessed due to timing of the contrast bolus. Anatomic variants: Predominant fetal type origin of the PCAs. 4 mm focal outpouching arising from the right MCA bifurcation, suspicious for aneurysm. Delayed phase: Not performed. Review of the MIP images confirms the above findings CT Brain Perfusion Findings: CBF (<30%) Volume: 35m Perfusion (Tmax>6.0s) volume: 927mMismatch Volume: 6926mnfarction Location:Anterior left MCA distribution, involving the left insular region and operculum. IMPRESSION: 1. Positive CTA with EVLO of distal left M1/proximal M2 branch at the base of the sylvian fissure. Acute left MCA territory infarct with surrounding penumbra as above. 2. Additional mild atheromatous change throughout the major arterial vasculature of the head and neck as above. No other hemodynamically significant or correctable identified. 3. 4 mm right MCA bifurcation aneurysm. 4. Innumerable lucent lesions throughout the visualized skeleton, compatible with multiple myeloma. Findings discussed with Dr. AroRory Percy approximately 5:45 p.m. on 06/30/2018. Electronically Signed   By: BenJeannine BogaD.   On: 06/30/2018 19:21   Ct Head Code Stroke Wo Contrast  Result Date: 06/30/2018 CLINICAL DATA:  Code stroke. Initial evaluation for  acute right-sided gaze. EXAM: CT HEAD WITHOUT CONTRAST TECHNIQUE: Contiguous axial images were obtained from the base of the skull through the vertex without intravenous contrast. COMPARISON:  Prior CT from 11/24/2015. FINDINGS: Brain: Age-related cerebral atrophy with chronic small vessel ischemic disease. Remote lacunar infarct present within the right basal ganglia. Subtle hypodensity seen involving the left insular ribbon, consistent with evolving acute ischemic left MCA territory infarct. No acute  intracranial hemorrhage. No mass lesion, midline shift or mass effect. No hydrocephalus. No extra-axial fluid collection. Vascular: No definite hyperdense vessel identified. Scattered vascular calcifications noted within the carotid siphons. Skull: Scalp soft tissues within normal limits. Multiple lucent lesion seen throughout the calvarium, consistent with multiple myeloma. Sinuses/Orbits: Left-sided gaze noted. Paranasal sinuses and mastoid air cells are clear. Other: None. ASPECTS [facility] Stroke Program Early CT Score) - Ganglionic level infarction (caudate, lentiform nuclei, internal capsule, insula, M1-M3 cortex): 6 - Supraganglionic infarction (M4-M6 cortex): 3 Total score (0-10 with 10 being normal): 9 IMPRESSION: 1. Abnormal hypodensity involving the left insular cortex, suspicious for acute left MCA territory infarct. 2. ASPECTS is 9. These results were communicated to Dr. Rory Percy at 6:25 pmon 9/9/2019by text page via the [facility] messaging system. Electronically Signed   By: Jeannine Boga M.D.   On: 06/30/2018 18:25   MRA and MRI pending  TTE pending   PHYSICAL EXAM  Temp:  [97 F (36.1 C)-97.6 F (36.4 C)] 97.6 F (36.4 C) (09/10 0800) Pulse Rate:  [60-110] 100 (09/10 1100) Resp:  [12-40] 23 (09/10 1100) BP: (85-153)/(55-88) 119/62 (09/10 1100) SpO2:  [93 %-100 %] 94 % (09/10 1100) Arterial Line BP: (83-168)/(44-91) 134/62 (09/10 1100) Weight:  [67.5 kg] 67.5 kg (09/09 2345)  General  - Well nourished, well developed, in no apparent distress.  Ophthalmologic - fundi not visualized due to noncooperation.  Cardiovascular - irregularly irregular heart rate and rhythm.  Neuro - mildly sleepy and lethargic, but easily arousable and eyes open on voice. Global aphasia, not following commands, no language output. Not repeating or naming. Left forced gaze, right neglect, not blinking to visual threat on the right. PERRL, not tracking on the right. RUE 0/5 and RLE mild withdraw on pain stimulation. LUE and LLE spontaneous movement and against gravity. Sensation, coordination and gait not tested.   ASSESSMENT/PLAN Ms. TACHA MANNI is a 72 y.o. female with history of HTN, MM in remission admitted for left gaze, aphasia and right weakness. No tPA given due to OSW.    Stroke:  left MCA large infarct due to left M1 occlusion , embolic secondary to new diagnosis of afib  Resultant full blown of left MCA syndrome  CT old right CR infarct  CTA head and neck left M1 M2 bifurcation occlusion  DSA - left M2 occlusion with initial TICI2b reperfusion but then re-occluded. Unsuccessful intervention  MRI  pending  MRA  pending  2D Echo  pending  LDL 62  HgbA1c 5.3  SCDs for VTE prophylaxis  NPO   No antithrombotic prior to admission, now on No antithrombotic. ASA listed in pt allergy list, will consider plavix once po access today and MRI no bleeding. Will consider DOACs pending MRI.   Ongoing aggressive stroke risk factor management  Therapy recommendations:  Pending   Disposition:  Pending   Afib - new diagnosis  Tele and EKG confirmed afib rhythm  Currently rate controlled  Likely the cause of current stroke  May consider DOACs pending MRI result  Hypertension Stable Wean off cleviprex as able BP goal < 180 post procedure given  Unsuccessful IR  Long term BP goal normotensive  Other Stroke Risk Factors  Advanced age  Other Active Problems  MM in  remission  Hospital day # 1  This patient is critically ill due to left MCA large stroke, s/p IR, afib and at significant risk of neurological worsening, death form recurrent stroke, hemorrhagic conversion, heart failure, seizure, cerebral edema. This  patient's care requires constant monitoring of vital signs, hemodynamics, respiratory and cardiac monitoring, review of multiple databases, neurological assessment, discussion with family, other specialists and medical decision making of high complexity. I spent 45 minutes of neurocritical care time in the care of this patient. I had long discussion with son and daughter in law at bedside, updated pt current condition, treatment plan and potential prognosis. They expressed understanding and appreciation.     Rosalin Hawking, MD PhD Stroke Neurology 07/01/2018 11:39 AM    To contact Stroke Continuity provider, please refer to [email or web address] After hours, contact General Neurology

## 2018-07-02 ENCOUNTER — Inpatient Hospital Stay (HOSPITAL_COMMUNITY): Payer: Medicare Other

## 2018-07-02 DIAGNOSIS — D72829 Elevated white blood cell count, unspecified: Secondary | ICD-10-CM

## 2018-07-02 DIAGNOSIS — R1312 Dysphagia, oropharyngeal phase: Secondary | ICD-10-CM

## 2018-07-02 LAB — CBC
HCT: 33.8 % — ABNORMAL LOW (ref 36.0–46.0)
Hemoglobin: 10.9 g/dL — ABNORMAL LOW (ref 12.0–15.0)
MCH: 29.6 pg (ref 26.0–34.0)
MCHC: 32.2 g/dL (ref 30.0–36.0)
MCV: 91.8 fL (ref 78.0–100.0)
PLATELETS: 120 10*3/uL — AB (ref 150–400)
RBC: 3.68 MIL/uL — AB (ref 3.87–5.11)
RDW: 13.5 % (ref 11.5–15.5)
WBC: 19.8 10*3/uL — AB (ref 4.0–10.5)

## 2018-07-02 LAB — URINALYSIS, COMPLETE (UACMP) WITH MICROSCOPIC
Bilirubin Urine: NEGATIVE
Glucose, UA: NEGATIVE mg/dL
Ketones, ur: NEGATIVE mg/dL
Leukocytes, UA: NEGATIVE
Nitrite: NEGATIVE
PH: 8 (ref 5.0–8.0)
Protein, ur: NEGATIVE mg/dL
SPECIFIC GRAVITY, URINE: 1.008 (ref 1.005–1.030)

## 2018-07-02 LAB — GLUCOSE, CAPILLARY
GLUCOSE-CAPILLARY: 103 mg/dL — AB (ref 70–99)
GLUCOSE-CAPILLARY: 115 mg/dL — AB (ref 70–99)
GLUCOSE-CAPILLARY: 111 mg/dL — AB (ref 70–99)
Glucose-Capillary: 113 mg/dL — ABNORMAL HIGH (ref 70–99)
Glucose-Capillary: 103 mg/dL — ABNORMAL HIGH (ref 70–99)

## 2018-07-02 LAB — BASIC METABOLIC PANEL
Anion gap: 12 (ref 5–15)
BUN: 8 mg/dL (ref 8–23)
CO2: 19 mmol/L — ABNORMAL LOW (ref 22–32)
Calcium: 8.1 mg/dL — ABNORMAL LOW (ref 8.9–10.3)
Chloride: 111 mmol/L (ref 98–111)
Creatinine, Ser: 0.72 mg/dL (ref 0.44–1.00)
GFR calc Af Amer: >60 mL/min (ref >60)
Glucose, Bld: 99 mg/dL (ref 70–99)
POTASSIUM: 3.3 mmol/L — AB (ref 3.5–5.1)
SODIUM: 142 mmol/L (ref 135–145)

## 2018-07-02 MED ORDER — JEVITY 1.2 CAL PO LIQD
1000.0000 mL | ORAL | Status: DC
  Filled 2018-07-02: qty 1000

## 2018-07-02 MED ORDER — CHLORHEXIDINE GLUCONATE 0.12 % MT SOLN
15.0000 mL | Freq: Two times a day (BID) | OROMUCOSAL | Status: DC
  Administered 2018-07-02 – 2018-07-08 (×13): 15 mL via OROMUCOSAL
  Filled 2018-07-02 (×13): qty 15

## 2018-07-02 MED ORDER — FREE WATER
30.0000 mL | Status: DC
  Administered 2018-07-02 – 2018-07-07 (×29): 30 mL

## 2018-07-02 MED ORDER — POTASSIUM CHLORIDE 10 MEQ/100ML IV SOLN
10.0000 meq | INTRAVENOUS | Status: AC
  Administered 2018-07-02 (×6): 10 meq via INTRAVENOUS
  Filled 2018-07-02 (×7): qty 100

## 2018-07-02 MED ORDER — ORAL CARE MOUTH RINSE
15.0000 mL | Freq: Two times a day (BID) | OROMUCOSAL | Status: DC
  Administered 2018-07-02 – 2018-07-08 (×10): 15 mL via OROMUCOSAL

## 2018-07-02 MED ORDER — JEVITY 1.5 CAL/FIBER PO LIQD
1000.0000 mL | ORAL | Status: DC
  Administered 2018-07-02 – 2018-07-05 (×3): 1000 mL
  Filled 2018-07-02 (×6): qty 1000

## 2018-07-02 NOTE — Progress Notes (Addendum)
  Speech Language Pathology Treatment: Dysphagia;Cognitive-Linquistic  Patient Details Name: Ruth Sanchez MRN: [id number] DOB: Jul 26, 1946 Today's Date: 07/02/2018 Time: 0910-0930 SLP Time Calculation (min) (ACUTE ONLY): 20 min  Assessment / Plan / Recommendation Clinical Impression  Pt demonstrates improved function today with max to total assist to utilize hand over hand assist for self feeding. Max visual, verbal and contextual cues given in left visual field with left hand to improve awareness and attention with PO and initiate self feeding. Pt able to sip from a cup and straw with some initial oral holding, but eventual swallow response. Multiple swallows and coughing observed concerning for pharyngeal dysphagia. Pt also self fed puree with a spoon. Pt shows potential for PO intake in the short term, but is not yet capable of nutritionally supporting herself. Favor use of alternate method of nutrition and eventual MBS when pt demonstrates consistent abilty to participate.   Attempted use of MIT and singing, counting to elicit verbalizations without success. Though she did engage in functional tasks more than yesterday. Recommend SNF, but CIR referral may be appropriate depending on progress.   HPI HPI: 72 year old female admitted 06/30/18 with stroke symptoms. PMH: arthritis, asthma, HTN. MRI pending.      SLP Plan  Continue with current plan of care       Recommendations  Diet recommendations: NPO Medication Administration: Via alternative means                General recommendations: Rehab consult Oral Care Recommendations: Oral care BID Follow up Recommendations: Inpatient Rehab Plan: Continue with current plan of care       [place] Jasmynn Pfalzgraf, MA CCC-SLP [phone]  Lynann Beaver 07/02/2018, 12:53 PM

## 2018-07-02 NOTE — Progress Notes (Signed)
Initial Nutrition Assessment  DOCUMENTATION CODES:   Not applicable  INTERVENTION:   Jevity 1.5 @ goal rate of 51m/hr- Initiate at 268mhr and increase by 10101mr every 8 hours until goal rate is reached.   Free water flushes 106m34m4 hrs   Regimen provides 1620kcal/day, 69g/day protein, 24g/day fiber, 1001ml65m free water   NUTRITION DIAGNOSIS:   Inadequate oral intake related to acute illness(MCA infarct ) as evidenced by NPO status.  GOAL:   Patient will meet greater than or equal to 90% of their needs  MONITOR:   Diet advancement, Labs, Weight trends, TF tolerance, Skin, I & O's  REASON FOR ASSESSMENT:   Malnutrition Screening Tool, Consult Enteral/tube feeding initiation and management  ASSESSMENT:   71 y/45female with h/o Afib, multiple myeloma admitted with large left MCA infarct   Visited pt's room today. Unable to speak with pt r/t AMS. History obtained from patients son at bedside. Per son, pt with poor appetite and oral intake at baseline. Son reports that pt was born with a "small throat" and mainly eats soft, easy to swallow foods such as soups and mashed potatoes. Pt moved in with her son late last year after her husband died. Pt's son prepares all of her meals and reports that she eats a pretty healthy diet. Pt does not eat a lot of meats but she will drink Ensure when offered. Per chart, pt with 10lb(6%) wt loss in one year; this is not significant. Cortrak tube placed today. Will plan to initiate tube feeds. Recommend SLP evaluation prior to diet advancement. Suspect pt is at low refeeding risk.    Medications reviewed and include: heparin, insulin, NaCl '@75ml' /hr, pepcid, KCl  Labs reviewed: K 3.3(L) Wbc- 19.8(H), Hgb 10.9(L), Hct 33.8(L)  NUTRITION - FOCUSED PHYSICAL EXAM:    Most Recent Value  Orbital Region  No depletion  Upper Arm Region  No depletion  Thoracic and Lumbar Region  No depletion  Buccal Region  No depletion  Temple Region  No  depletion  Clavicle Bone Region  No depletion  Clavicle and Acromion Bone Region  No depletion  Scapular Bone Region  No depletion  Dorsal Hand  No depletion  Patellar Region  No depletion  Anterior Thigh Region  No depletion  Posterior Calf Region  No depletion  Edema (RD Assessment)  Mild  Hair  Reviewed  Eyes  Reviewed  Mouth  Reviewed  Skin  Reviewed  Nails  Reviewed     Diet Order:   Diet Order            Diet NPO time specified  Diet effective now             EDUCATION NEEDS:   Not appropriate for education at this time  Skin:  Skin Assessment: Reviewed RN Assessment  Last BM:  pta  Height:   Ht Readings from Last 1 Encounters:  06/30/18 5' (1.524 m)    Weight:   Wt Readings from Last 1 Encounters:  06/30/18 67.5 kg    Ideal Body Weight:  45.45 kg  BMI:  Body mass index is 29.06 kg/m.  Estimated Nutritional Needs:   Kcal:  1300-1600kcal/day  Protein:  68-75  Fluid:  >1.3L/day   CaseyKoleen DistanceRD, LDN Pager #- [phone]#- [phone] Hours Pager: [phone]

## 2018-07-02 NOTE — Progress Notes (Signed)
STROKE TEAM PROGRESS NOTE   SUBJECTIVE (INTERVAL HISTORY) Her son is at the bedside.  She is mildly sleepy but easily arousable. She still has significant left MCA syndrome.MRI showed large left MCA infarct. Did not pass swallow, need cortrak today. Discussed with son for possible poor prognosis. He understands.      OBJECTIVE Temp:  [99 F (37.2 C)-100.3 F (37.9 C)] 99.2 F (37.3 C) (09/11 0800) Pulse Rate:  [72-123] 99 (09/11 0700) Cardiac Rhythm: Atrial fibrillation (09/10 2000) Resp:  [18-29] 23 (09/11 0700) BP: (124-173)/(74-110) 154/74 (09/11 0700) SpO2:  [96 %-100 %] 96 % (09/11 0700) Arterial Line BP: (184-199)/(78-89) 199/89 (09/10 1300)  Recent Labs  Lab 07/01/18 1524 07/01/18 1927 07/01/18 2320 07/02/18 0317 07/02/18 0741  GLUCAP 114* 102* 100* 103* 113*   Recent Labs  Lab 06/30/18 1808 06/30/18 1812 07/01/18 0503 07/02/18 0347  NA 142 142 140 142  K 2.9* 3.0* 2.8* 3.3*  CL 106 108 108 111  CO2  --  25 19* 19*  GLUCOSE 107* 108* 169* 99  BUN 13 12 6* 8  CREATININE 0.70 0.78 0.68 0.72  CALCIUM  --  9.6 8.4* 8.1*   Recent Labs  Lab 06/30/18 1812  AST 23  ALT 13  ALKPHOS 107  BILITOT 1.3*  PROT 7.7  ALBUMIN 3.8   Recent Labs  Lab 06/30/18 1808 06/30/18 1812 07/02/18 0347  WBC  --  8.4 19.8*  NEUTROABS  --  6.2  --   HGB 12.9 12.5 10.9*  HCT 38.0 40.3 33.8*  MCV  --  93.5 91.8  PLT  --  125* 120*   No results for input(s): CKTOTAL, CKMB, CKMBINDEX, TROPONINI in the last 168 hours. Recent Labs    06/30/18 1812  LABPROT 14.6  INR 1.15   No results for input(s): COLORURINE, LABSPEC, PHURINE, GLUCOSEU, HGBUR, BILIRUBINUR, KETONESUR, PROTEINUR, UROBILINOGEN, NITRITE, LEUKOCYTESUR in the last 72 hours.  Invalid input(s): APPERANCEUR     Component Value Date/Time   CHOL 134 07/01/2018 0503   TRIG 74 07/01/2018 0503   HDL 57 07/01/2018 0503   CHOLHDL 2.4 07/01/2018 0503   VLDL 15 07/01/2018 0503   LDLCALC 62 07/01/2018 0503   Lab  Results  Component Value Date   HGBA1C 5.3 07/01/2018      Component Value Date/Time   COCAINSCRNUR Negative 01/14/2017 1215    No results for input(s): ETH in the last 168 hours.  I have personally reviewed the radiological images below and agree with the radiology interpretations.  Ct Angio Head W Or Wo Contrast  Result Date: 06/30/2018 CLINICAL DATA:  Initial evaluation for acute stroke. EXAM: CT ANGIOGRAPHY HEAD AND NECK CT PERFUSION BRAIN TECHNIQUE: Multidetector CT imaging of the head and neck was performed using the standard protocol during bolus administration of intravenous contrast. Multiplanar CT image reconstructions and MIPs were obtained to evaluate the vascular anatomy. Carotid stenosis measurements (when applicable) are obtained utilizing NASCET criteria, using the distal internal carotid diameter as the denominator. Multiphase CT imaging of the brain was performed following IV bolus contrast injection. Subsequent parametric perfusion maps were calculated using RAPID software. CONTRAST:  115m ISOVUE-370 IOPAMIDOL (ISOVUE-370) INJECTION 76% COMPARISON:  Prior noncontrast CT from earlier the same day. FINDINGS: CTA NECK FINDINGS Aortic arch: Visualized aortic arch of normal caliber with normal branch pattern. No hemodynamically significant stenosis about the origin of the great vessels. Visualized subclavian arteries widely patent. Right carotid system: Right common carotid artery tortuous proximally but widely patent to the bifurcation.  Mild atheromatous plaque about the proximal right ICA without stenosis. Right ICA widely patent distally to the skull base without stenosis, dissection, or occlusion Left carotid system: Left common carotid artery tortuous proximally but widely patent to the bifurcation without stenosis. Mild plaque about the left bifurcation without hemodynamically significant stenosis. Left ICA patent distally to the skull base without stenosis, dissection, or  occlusion. Vertebral arteries: Both of the vertebral arteries arise from the subclavian arteries. Vertebral arteries widely patent within the neck without stenosis, dissection, or occlusion. Skeleton: Innumerable lytic lesions seen diffusely throughout the visualized spine, compatible with history of multiple myeloma. No acute osseous abnormality. Other neck: Soft tissues of the neck demonstrate no acute finding. Enlarged goiter is change of the left lobe of thyroid with substernal extension. Upper chest: Visualized upper chest within normal limits. Partially visualized lungs are clear. Review of the MIP images confirms the above findings CTA HEAD FINDINGS Anterior circulation: Internal carotid arteries patent to the termini without hemodynamically significant stenosis. A1 segments patent. Anterior communicating artery normal. Anterior cerebral arteries patent to their distal aspects. Left M1 patent proximally. There is prepped occlusion of the distal left M1/proximal M2 at the left MCA bifurcation (series 9, image 96). Attenuated and reduced flow seen distally within left MCA branches. Right M1 patent. Normal right MCA bifurcation. Distal right MCA branches well perfused. Posterior circulation: Vertebral arteries diminutive but patent to the vertebrobasilar junction without stenosis. Left PICA patent. Right PICA not well visualized. Basilar diminutive but patent to its distal aspect without stenosis. Superior cerebral arteries patent proximally. Fetal type origin of the left PCA. Hypoplastic right P1 with robust right posterior communicating artery. PCAs patent to their distal aspects. Venous sinuses: Grossly patent, although not well assessed due to timing of the contrast bolus. Anatomic variants: Predominant fetal type origin of the PCAs. 4 mm focal outpouching arising from the right MCA bifurcation, suspicious for aneurysm. Delayed phase: Not performed. Review of the MIP images confirms the above findings CT  Brain Perfusion Findings: CBF (<30%) Volume: 73m Perfusion (Tmax>6.0s) volume: 958mMismatch Volume: 6992mnfarction Location:Anterior left MCA distribution, involving the left insular region and operculum. IMPRESSION: 1. Positive CTA with EVLO of distal left M1/proximal M2 branch at the base of the sylvian fissure. Acute left MCA territory infarct with surrounding penumbra as above. 2. Additional mild atheromatous change throughout the major arterial vasculature of the head and neck as above. No other hemodynamically significant or correctable identified. 3. 4 mm right MCA bifurcation aneurysm. 4. Innumerable lucent lesions throughout the visualized skeleton, compatible with multiple myeloma. Findings discussed with Dr. AroRory Percy approximately 5:45 p.m. on 06/30/2018. Electronically Signed   By: BenJeannine BogaD.   On: 06/30/2018 19:21   Ct Angio Neck W Or Wo Contrast  Result Date: 06/30/2018 CLINICAL DATA:  Initial evaluation for acute stroke. EXAM: CT ANGIOGRAPHY HEAD AND NECK CT PERFUSION BRAIN TECHNIQUE: Multidetector CT imaging of the head and neck was performed using the standard protocol during bolus administration of intravenous contrast. Multiplanar CT image reconstructions and MIPs were obtained to evaluate the vascular anatomy. Carotid stenosis measurements (when applicable) are obtained utilizing NASCET criteria, using the distal internal carotid diameter as the denominator. Multiphase CT imaging of the brain was performed following IV bolus contrast injection. Subsequent parametric perfusion maps were calculated using RAPID software. CONTRAST:  100m32mOVUE-370 IOPAMIDOL (ISOVUE-370) INJECTION 76% COMPARISON:  Prior noncontrast CT from earlier the same day. FINDINGS: CTA NECK FINDINGS Aortic arch: Visualized aortic arch of normal caliber  with normal branch pattern. No hemodynamically significant stenosis about the origin of the great vessels. Visualized subclavian arteries widely patent.  Right carotid system: Right common carotid artery tortuous proximally but widely patent to the bifurcation. Mild atheromatous plaque about the proximal right ICA without stenosis. Right ICA widely patent distally to the skull base without stenosis, dissection, or occlusion Left carotid system: Left common carotid artery tortuous proximally but widely patent to the bifurcation without stenosis. Mild plaque about the left bifurcation without hemodynamically significant stenosis. Left ICA patent distally to the skull base without stenosis, dissection, or occlusion. Vertebral arteries: Both of the vertebral arteries arise from the subclavian arteries. Vertebral arteries widely patent within the neck without stenosis, dissection, or occlusion. Skeleton: Innumerable lytic lesions seen diffusely throughout the visualized spine, compatible with history of multiple myeloma. No acute osseous abnormality. Other neck: Soft tissues of the neck demonstrate no acute finding. Enlarged goiter is change of the left lobe of thyroid with substernal extension. Upper chest: Visualized upper chest within normal limits. Partially visualized lungs are clear. Review of the MIP images confirms the above findings CTA HEAD FINDINGS Anterior circulation: Internal carotid arteries patent to the termini without hemodynamically significant stenosis. A1 segments patent. Anterior communicating artery normal. Anterior cerebral arteries patent to their distal aspects. Left M1 patent proximally. There is prepped occlusion of the distal left M1/proximal M2 at the left MCA bifurcation (series 9, image 96). Attenuated and reduced flow seen distally within left MCA branches. Right M1 patent. Normal right MCA bifurcation. Distal right MCA branches well perfused. Posterior circulation: Vertebral arteries diminutive but patent to the vertebrobasilar junction without stenosis. Left PICA patent. Right PICA not well visualized. Basilar diminutive but patent to  its distal aspect without stenosis. Superior cerebral arteries patent proximally. Fetal type origin of the left PCA. Hypoplastic right P1 with robust right posterior communicating artery. PCAs patent to their distal aspects. Venous sinuses: Grossly patent, although not well assessed due to timing of the contrast bolus. Anatomic variants: Predominant fetal type origin of the PCAs. 4 mm focal outpouching arising from the right MCA bifurcation, suspicious for aneurysm. Delayed phase: Not performed. Review of the MIP images confirms the above findings CT Brain Perfusion Findings: CBF (<30%) Volume: 3m Perfusion (Tmax>6.0s) volume: 970mMismatch Volume: 695mnfarction Location:Anterior left MCA distribution, involving the left insular region and operculum. IMPRESSION: 1. Positive CTA with EVLO of distal left M1/proximal M2 branch at the base of the sylvian fissure. Acute left MCA territory infarct with surrounding penumbra as above. 2. Additional mild atheromatous change throughout the major arterial vasculature of the head and neck as above. No other hemodynamically significant or correctable identified. 3. 4 mm right MCA bifurcation aneurysm. 4. Innumerable lucent lesions throughout the visualized skeleton, compatible with multiple myeloma. Findings discussed with Dr. AroRory Percy approximately 5:45 p.m. on 06/30/2018. Electronically Signed   By: BenJeannine BogaD.   On: 06/30/2018 19:21   Ct Cerebral Perfusion W Contrast  Result Date: 06/30/2018 CLINICAL DATA:  Initial evaluation for acute stroke. EXAM: CT ANGIOGRAPHY HEAD AND NECK CT PERFUSION BRAIN TECHNIQUE: Multidetector CT imaging of the head and neck was performed using the standard protocol during bolus administration of intravenous contrast. Multiplanar CT image reconstructions and MIPs were obtained to evaluate the vascular anatomy. Carotid stenosis measurements (when applicable) are obtained utilizing NASCET criteria, using the distal internal  carotid diameter as the denominator. Multiphase CT imaging of the brain was performed following IV bolus contrast injection. Subsequent parametric perfusion maps were  calculated using RAPID software. CONTRAST:  196m ISOVUE-370 IOPAMIDOL (ISOVUE-370) INJECTION 76% COMPARISON:  Prior noncontrast CT from earlier the same day. FINDINGS: CTA NECK FINDINGS Aortic arch: Visualized aortic arch of normal caliber with normal branch pattern. No hemodynamically significant stenosis about the origin of the great vessels. Visualized subclavian arteries widely patent. Right carotid system: Right common carotid artery tortuous proximally but widely patent to the bifurcation. Mild atheromatous plaque about the proximal right ICA without stenosis. Right ICA widely patent distally to the skull base without stenosis, dissection, or occlusion Left carotid system: Left common carotid artery tortuous proximally but widely patent to the bifurcation without stenosis. Mild plaque about the left bifurcation without hemodynamically significant stenosis. Left ICA patent distally to the skull base without stenosis, dissection, or occlusion. Vertebral arteries: Both of the vertebral arteries arise from the subclavian arteries. Vertebral arteries widely patent within the neck without stenosis, dissection, or occlusion. Skeleton: Innumerable lytic lesions seen diffusely throughout the visualized spine, compatible with history of multiple myeloma. No acute osseous abnormality. Other neck: Soft tissues of the neck demonstrate no acute finding. Enlarged goiter is change of the left lobe of thyroid with substernal extension. Upper chest: Visualized upper chest within normal limits. Partially visualized lungs are clear. Review of the MIP images confirms the above findings CTA HEAD FINDINGS Anterior circulation: Internal carotid arteries patent to the termini without hemodynamically significant stenosis. A1 segments patent. Anterior communicating artery  normal. Anterior cerebral arteries patent to their distal aspects. Left M1 patent proximally. There is prepped occlusion of the distal left M1/proximal M2 at the left MCA bifurcation (series 9, image 96). Attenuated and reduced flow seen distally within left MCA branches. Right M1 patent. Normal right MCA bifurcation. Distal right MCA branches well perfused. Posterior circulation: Vertebral arteries diminutive but patent to the vertebrobasilar junction without stenosis. Left PICA patent. Right PICA not well visualized. Basilar diminutive but patent to its distal aspect without stenosis. Superior cerebral arteries patent proximally. Fetal type origin of the left PCA. Hypoplastic right P1 with robust right posterior communicating artery. PCAs patent to their distal aspects. Venous sinuses: Grossly patent, although not well assessed due to timing of the contrast bolus. Anatomic variants: Predominant fetal type origin of the PCAs. 4 mm focal outpouching arising from the right MCA bifurcation, suspicious for aneurysm. Delayed phase: Not performed. Review of the MIP images confirms the above findings CT Brain Perfusion Findings: CBF (<30%) Volume: 236mPerfusion (Tmax>6.0s) volume: 98108mismatch Volume: 56m49mfarction Location:Anterior left MCA distribution, involving the left insular region and operculum. IMPRESSION: 1. Positive CTA with EVLO of distal left M1/proximal M2 branch at the base of the sylvian fissure. Acute left MCA territory infarct with surrounding penumbra as above. 2. Additional mild atheromatous change throughout the major arterial vasculature of the head and neck as above. No other hemodynamically significant or correctable identified. 3. 4 mm right MCA bifurcation aneurysm. 4. Innumerable lucent lesions throughout the visualized skeleton, compatible with multiple myeloma. Findings discussed with Dr. ArorRory Percyapproximately 5:45 p.m. on 06/30/2018. Electronically Signed   By: BenjJeannine Boga.    On: 06/30/2018 19:21   Ct Head Code Stroke Wo Contrast  Result Date: 06/30/2018 CLINICAL DATA:  Code stroke. Initial evaluation for acute right-sided gaze. EXAM: CT HEAD WITHOUT CONTRAST TECHNIQUE: Contiguous axial images were obtained from the base of the skull through the vertex without intravenous contrast. COMPARISON:  Prior CT from 11/24/2015. FINDINGS: Brain: Age-related cerebral atrophy with chronic small vessel ischemic disease. Remote lacunar infarct  present within the right basal ganglia. Subtle hypodensity seen involving the left insular ribbon, consistent with evolving acute ischemic left MCA territory infarct. No acute intracranial hemorrhage. No mass lesion, midline shift or mass effect. No hydrocephalus. No extra-axial fluid collection. Vascular: No definite hyperdense vessel identified. Scattered vascular calcifications noted within the carotid siphons. Skull: Scalp soft tissues within normal limits. Multiple lucent lesion seen throughout the calvarium, consistent with multiple myeloma. Sinuses/Orbits: Left-sided gaze noted. Paranasal sinuses and mastoid air cells are clear. Other: None. ASPECTS [facility] Stroke Program Early CT Score) - Ganglionic level infarction (caudate, lentiform nuclei, internal capsule, insula, M1-M3 cortex): 6 - Supraganglionic infarction (M4-M6 cortex): 3 Total score (0-10 with 10 being normal): 9 IMPRESSION: 1. Abnormal hypodensity involving the left insular cortex, suspicious for acute left MCA territory infarct. 2. ASPECTS is 9. These results were communicated to Dr. Rory Percy at 6:25 pmon 9/9/2019by text page via the [facility] messaging system. Electronically Signed   By: Jeannine Boga M.D.   On: 06/30/2018 18:25   Dg Chest 2 View  Result Date: 07/01/2018 CLINICAL DATA:  Acute ischemic stroke. EXAM: CHEST - 2 VIEW COMPARISON:  12/10/2016 FINDINGS: Patient is rotated to the right. Both lungs are clear. No evidence of pneumothorax or pleural effusion. IMPRESSION: No  active lung disease. Electronically Signed   By: Earle Gell M.D.   On: 07/01/2018 19:38   Mr Jodene Nam Head Wo Contrast  Result Date: 07/01/2018 CLINICAL DATA:  Follow-up examination for acute stroke. EXAM: MRI HEAD WITHOUT CONTRAST MRA HEAD WITHOUT CONTRAST TECHNIQUE: Multiplanar, multiecho pulse sequences of the brain and surrounding structures were obtained without intravenous contrast. Angiographic images of the head were obtained using MRA technique without contrast. COMPARISON:  Prior CTA perfusion from 06/30/2018. FINDINGS: MRI HEAD FINDINGS Brain: Examination severely degraded by motion artifact. Large confluent area of restricted diffusion seen involving the majority of the left MCA territory, with involvement of the left frontal, parietal, and temporal lobes, consistent with acute left MCA territory infarct. Associated localized gyral swelling and edema without significant regional mass effect at this time. Evidence for petechial hemorrhage superior to the left insula noted (series 10, image 69). No frank hemorrhagic transformation. Additional susceptibility artifact within the left sylvian fissure favored to be related to persistent intravascular thrombus. Remote right basal ganglia lacunar infarct with associated chronic hemosiderin staining noted. Additional chronic hemosiderin staining noted at the right parietal lobe as well. Underlying age-related cerebral atrophy with mild chronic small vessel ischemic disease. No mass lesion or midline shift. No hydrocephalus. No extra-axial fluid collection. Pituitary gland normal. Vascular: Susceptibility artifact within the left sylvian fissure, most likely intraluminal arterial thrombus within left MCA branches. Major intracranial vascular flow voids otherwise maintained at the skull base. Skull and upper cervical spine: Craniocervical junction normal. Diffuse osseous changes related to multiple myeloma noted. No scalp soft tissue abnormality. Sinuses/Orbits:  Globes and orbital soft tissues within normal limits. Paranasal sinuses are largely clear. No mastoid effusion. Inner ear structures grossly normal. Other: None. MRA HEAD FINDINGS ANTERIOR CIRCULATION: Examination severely degraded by motion artifact. Distal cervical segments of the internal carotid arteries are patent with antegrade flow. Petrous, cavernous, and supraclinoid segments patent without hemodynamically significant stenosis. A1 segments, anterior communicating artery, and anterior cerebral arteries grossly patent without stenosis. Proximal and mid left M1 patent. There is persistent occlusion of distal left M1/proximal M2 at the base of the sylvian fissure (series 4, image 60). Persistent flow within the anterior temporal branch. No other significant flow within the left  MCA territory. Right M1 widely patent without stenosis. No proximal right M2 occlusion. Distal right MCA branches grossly perfused. POSTERIOR CIRCULATION: Vertebral arteries grossly patent to the vertebrobasilar junction without stenosis. Posterior inferior cerebral arteries patent bilaterally. Basilar patent to its distal aspect without obvious stenosis. Superior cerebral arteries patent bilaterally. PCAs grossly patent to their distal aspects without obvious stenosis. IMPRESSION: MRI HEAD IMPRESSION: 1. Large evolving acute left MCA territory infarct involving the majority of the left MCA territory. Associated petechial hemorrhage anteriorly without frank hemorrhagic transformation. Localized gyral swelling and edema without significant mass effect at this time. 2. Chronic right basal ganglia lacunar infarct. 3. Underlying mild chronic small vessel ischemic disease. MRA HEAD IMPRESSION: 1. Persistent distal left M1/proximal M2 occlusion with evidence for intraluminal thrombus in the left sylvian fissure. 2. Otherwise grossly stable intracranial MRA with no other large vessel occlusion or hemodynamically significant stenosis.  Electronically Signed   By: Jeannine Boga M.D.   On: 07/01/2018 18:00   Mr Brain Wo Contrast  Result Date: 07/01/2018 CLINICAL DATA:  Follow-up examination for acute stroke. EXAM: MRI HEAD WITHOUT CONTRAST MRA HEAD WITHOUT CONTRAST TECHNIQUE: Multiplanar, multiecho pulse sequences of the brain and surrounding structures were obtained without intravenous contrast. Angiographic images of the head were obtained using MRA technique without contrast. COMPARISON:  Prior CTA perfusion from 06/30/2018. FINDINGS: MRI HEAD FINDINGS Brain: Examination severely degraded by motion artifact. Large confluent area of restricted diffusion seen involving the majority of the left MCA territory, with involvement of the left frontal, parietal, and temporal lobes, consistent with acute left MCA territory infarct. Associated localized gyral swelling and edema without significant regional mass effect at this time. Evidence for petechial hemorrhage superior to the left insula noted (series 10, image 69). No frank hemorrhagic transformation. Additional susceptibility artifact within the left sylvian fissure favored to be related to persistent intravascular thrombus. Remote right basal ganglia lacunar infarct with associated chronic hemosiderin staining noted. Additional chronic hemosiderin staining noted at the right parietal lobe as well. Underlying age-related cerebral atrophy with mild chronic small vessel ischemic disease. No mass lesion or midline shift. No hydrocephalus. No extra-axial fluid collection. Pituitary gland normal. Vascular: Susceptibility artifact within the left sylvian fissure, most likely intraluminal arterial thrombus within left MCA branches. Major intracranial vascular flow voids otherwise maintained at the skull base. Skull and upper cervical spine: Craniocervical junction normal. Diffuse osseous changes related to multiple myeloma noted. No scalp soft tissue abnormality. Sinuses/Orbits: Globes and  orbital soft tissues within normal limits. Paranasal sinuses are largely clear. No mastoid effusion. Inner ear structures grossly normal. Other: None. MRA HEAD FINDINGS ANTERIOR CIRCULATION: Examination severely degraded by motion artifact. Distal cervical segments of the internal carotid arteries are patent with antegrade flow. Petrous, cavernous, and supraclinoid segments patent without hemodynamically significant stenosis. A1 segments, anterior communicating artery, and anterior cerebral arteries grossly patent without stenosis. Proximal and mid left M1 patent. There is persistent occlusion of distal left M1/proximal M2 at the base of the sylvian fissure (series 4, image 60). Persistent flow within the anterior temporal branch. No other significant flow within the left MCA territory. Right M1 widely patent without stenosis. No proximal right M2 occlusion. Distal right MCA branches grossly perfused. POSTERIOR CIRCULATION: Vertebral arteries grossly patent to the vertebrobasilar junction without stenosis. Posterior inferior cerebral arteries patent bilaterally. Basilar patent to its distal aspect without obvious stenosis. Superior cerebral arteries patent bilaterally. PCAs grossly patent to their distal aspects without obvious stenosis. IMPRESSION: MRI HEAD IMPRESSION: 1. Large evolving acute  left MCA territory infarct involving the majority of the left MCA territory. Associated petechial hemorrhage anteriorly without frank hemorrhagic transformation. Localized gyral swelling and edema without significant mass effect at this time. 2. Chronic right basal ganglia lacunar infarct. 3. Underlying mild chronic small vessel ischemic disease. MRA HEAD IMPRESSION: 1. Persistent distal left M1/proximal M2 occlusion with evidence for intraluminal thrombus in the left sylvian fissure. 2. Otherwise grossly stable intracranial MRA with no other large vessel occlusion or hemodynamically significant stenosis. Electronically Signed    By: Jeannine Boga M.D.   On: 07/01/2018 18:00    TTE - Left ventricle: The cavity size was normal. Wall thickness was   normal. Systolic function was normal. The estimated ejection   fraction was in the range of 55% to 60%. Wall motion was normal;   there were no regional wall motion abnormalities. - Aortic valve: There was trivial regurgitation. - Mitral valve: There was mild regurgitation. - Left atrium: The atrium was severely dilated. - Right atrium: The atrium was mildly to moderately dilated. - Tricuspid valve: There was moderate regurgitation. - Pulmonary arteries: Systolic pressure was mildly increased. PA   peak pressure: 33 mm Hg (S).    PHYSICAL EXAM  Temp:  [99 F (37.2 C)-100.3 F (37.9 C)] 99.2 F (37.3 C) (09/11 0800) Pulse Rate:  [72-123] 99 (09/11 0700) Resp:  [18-29] 23 (09/11 0700) BP: (124-173)/(74-110) 154/74 (09/11 0700) SpO2:  [96 %-100 %] 96 % (09/11 0700) Arterial Line BP: (184-199)/(78-89) 199/89 (09/10 1300)  General - Well nourished, well developed, mildly sleepy.  Ophthalmologic - fundi not visualized due to noncooperation.  Cardiovascular - irregularly irregular heart rate and rhythm.  Neuro - mildly sleepy but easily arousable and eyes open on voice. Global aphasia, not following commands, no language output. Not repeating or naming. Left forced gaze, right neglect, not blinking to visual threat on the right. PERRL, not tracking on the right. RUE 0/5 and RLE mild withdraw on pain stimulation. LUE and LLE spontaneous movement and against gravity. Sensation, coordination and gait not tested.   ASSESSMENT/PLAN Ms. RAQUEL RACEY is a 72 y.o. female with history of HTN, MM in remission admitted for left gaze, aphasia and right weakness. No tPA given due to OSW.    Stroke:  left MCA large infarct due to left M1 occlusion, embolic secondary to new diagnosis of afib  Resultant full blown of left MCA syndrome  CT old right CR  infarct  CTA head and neck left M1 M2 bifurcation occlusion  DSA - left M2 occlusion with initial TICI2b reperfusion but then re-occluded. Unsuccessful intervention  MRI confirmed left large MCA infarct  MRA persistent left M1 occlusion  2D Echo EF 55-60%  LDL 62  HgbA1c 5.3  SCDs for VTE prophylaxis  NPO   No antithrombotic prior to admission, now on No antithrombotic. antiplatlet on hold for now given large MCA infarct and small hemorrhagic conversion. Will consider plavix after po access. ASA allergy noted. Will consider DOACs in 14 days if neuro stable.   Ongoing aggressive stroke risk factor management  Therapy recommendations:  Pending   Disposition:  Pending   Afib - new diagnosis  Tele and EKG confirmed afib rhythm  Currently rate controlled  Likely the cause of current stroke  May consider DOACs in 14 days if neuro stable  Dysphagia  Did not pass swallow  Will need cortrak today  Consider TF after po access  Consider plavix after po access  Leukocytosis  WBC 8.4->19.8  UA pending  CXR 07/01/18 no active disease  CXR pending today  Afebrile  Close CBC monitoring  Hypertension Stable off cleviprex BP goal < 180 for acute infarct  Long term BP goal 130-150 given left M1 persistent occlusion  Other Stroke Risk Factors  Advanced age  Other Active Problems  MM in remission  Hospital day # 2  This patient is critically ill due to left MCA large stroke, s/p IR, leukocytosis, afib and at significant risk of neurological worsening, death form recurrent stroke, hemorrhagic conversion, heart failure, seizure, cerebral edema. This patient's care requires constant monitoring of vital signs, hemodynamics, respiratory and cardiac monitoring, review of multiple databases, neurological assessment, discussion with family, other specialists and medical decision making of high complexity. I spent 45 minutes of neurocritical care time in the care of  this patient. I had long discussion with son at bedside, updated pt current condition, treatment plan and potential prognosis. He expressed understanding and appreciation.     Rosalin Hawking, MD PhD Stroke Neurology 07/02/2018 11:17 AM    To contact Stroke Continuity provider, please refer to [email or web address] After hours, contact General Neurology

## 2018-07-02 NOTE — Progress Notes (Signed)
Cortrak Tube Team Note:  Consult received to place a Cortrak feeding tube.   A 10 F Cortrak tube was placed in the left nare and secured with a nasal bridle at 75 cm. Per the Cortrak monitor reading the tube tip is post pyloric.   X-ray is required, abdominal x-ray has been ordered by the Cortrak team. Please confirm tube placement before using the Cortrak tube.   If the tube becomes dislodged please keep the tube and contact the Cortrak team at [email or web address] (password [email or web address]) for replacement.  If after hours and replacement cannot be delayed, place a NG tube and confirm placement with an abdominal x-ray.    Koleen Distance MS, RD, LDN Pager #- [phone] Office#- [phone] After Hours Pager: [phone]

## 2018-07-03 DIAGNOSIS — I4891 Unspecified atrial fibrillation: Secondary | ICD-10-CM

## 2018-07-03 DIAGNOSIS — Z4659 Encounter for fitting and adjustment of other gastrointestinal appliance and device: Secondary | ICD-10-CM

## 2018-07-03 LAB — CBC
HCT: 33.8 % — ABNORMAL LOW (ref 36.0–46.0)
Hemoglobin: 10.8 g/dL — ABNORMAL LOW (ref 12.0–15.0)
MCH: 29.2 pg (ref 26.0–34.0)
MCHC: 32 g/dL (ref 30.0–36.0)
MCV: 91.4 fL (ref 78.0–100.0)
PLATELETS: 120 10*3/uL — AB (ref 150–400)
RBC: 3.7 MIL/uL — AB (ref 3.87–5.11)
RDW: 13.4 % (ref 11.5–15.5)
WBC: 15.5 10*3/uL — AB (ref 4.0–10.5)

## 2018-07-03 LAB — GLUCOSE, CAPILLARY
GLUCOSE-CAPILLARY: 102 mg/dL — AB (ref 70–99)
GLUCOSE-CAPILLARY: 150 mg/dL — AB (ref 70–99)
GLUCOSE-CAPILLARY: 87 mg/dL (ref 70–99)
Glucose-Capillary: 110 mg/dL — ABNORMAL HIGH (ref 70–99)
Glucose-Capillary: 114 mg/dL — ABNORMAL HIGH (ref 70–99)
Glucose-Capillary: 116 mg/dL — ABNORMAL HIGH (ref 70–99)
Glucose-Capillary: 130 mg/dL — ABNORMAL HIGH (ref 70–99)

## 2018-07-03 LAB — BASIC METABOLIC PANEL
ANION GAP: 10 (ref 5–15)
BUN: 9 mg/dL (ref 8–23)
CO2: 21 mmol/L — ABNORMAL LOW (ref 22–32)
Calcium: 7.9 mg/dL — ABNORMAL LOW (ref 8.9–10.3)
Chloride: 108 mmol/L (ref 98–111)
Creatinine, Ser: 0.69 mg/dL (ref 0.44–1.00)
GFR calc non Af Amer: >60 mL/min (ref >60)
Glucose, Bld: 113 mg/dL — ABNORMAL HIGH (ref 70–99)
POTASSIUM: 3.2 mmol/L — AB (ref 3.5–5.1)
SODIUM: 139 mmol/L (ref 135–145)

## 2018-07-03 MED ORDER — CARVEDILOL 12.5 MG PO TABS
12.5000 mg | ORAL_TABLET | Freq: Two times a day (BID) | ORAL | Status: DC
  Filled 2018-07-03: qty 1

## 2018-07-03 MED ORDER — CARVEDILOL 12.5 MG PO TABS
12.5000 mg | ORAL_TABLET | Freq: Two times a day (BID) | ORAL | Status: DC
  Administered 2018-07-03 – 2018-07-08 (×11): 12.5 mg
  Filled 2018-07-03 (×10): qty 1

## 2018-07-03 MED ORDER — AMLODIPINE BESYLATE 5 MG PO TABS
5.0000 mg | ORAL_TABLET | Freq: Every day | ORAL | Status: DC
  Filled 2018-07-03: qty 1

## 2018-07-03 MED ORDER — AMLODIPINE BESYLATE 5 MG PO TABS
5.0000 mg | ORAL_TABLET | Freq: Every day | ORAL | Status: DC
  Administered 2018-07-03 – 2018-07-08 (×6): 5 mg
  Filled 2018-07-03 (×5): qty 1

## 2018-07-03 MED ORDER — AMLODIPINE BESYLATE 2.5 MG PO TABS: 2.5000 mg | ORAL_TABLET | Freq: Every day | ORAL | Status: DC

## 2018-07-03 MED ORDER — POTASSIUM CHLORIDE 20 MEQ/15ML (10%) PO SOLN
40.0000 meq | Freq: Two times a day (BID) | ORAL | Status: AC
  Administered 2018-07-03 (×2): 40 meq via ORAL
  Filled 2018-07-03 (×2): qty 30

## 2018-07-03 MED ORDER — METOPROLOL TARTRATE 25 MG PO TABS: 25.0000 mg | ORAL_TABLET | Freq: Two times a day (BID) | ORAL | Status: DC

## 2018-07-03 MED ORDER — CLOPIDOGREL BISULFATE 75 MG PO TABS
75.0000 mg | ORAL_TABLET | Freq: Every day | ORAL | Status: DC
  Administered 2018-07-04 – 2018-07-07 (×4): 75 mg
  Filled 2018-07-03 (×4): qty 1

## 2018-07-03 MED ORDER — CLOPIDOGREL BISULFATE 75 MG PO TABS
75.0000 mg | ORAL_TABLET | Freq: Once | ORAL | Status: AC
  Administered 2018-07-03: 75 mg via ORAL

## 2018-07-03 MED ORDER — CLOPIDOGREL BISULFATE 75 MG PO TABS
75.0000 mg | ORAL_TABLET | Freq: Every day | ORAL | Status: DC
  Filled 2018-07-03: qty 1

## 2018-07-03 MED ORDER — PANTOPRAZOLE SODIUM 40 MG PO PACK
40.0000 mg | PACK | Freq: Every day | ORAL | Status: DC
  Administered 2018-07-03 – 2018-07-08 (×6): 40 mg
  Filled 2018-07-03 (×6): qty 20

## 2018-07-03 MED ORDER — PANTOPRAZOLE SODIUM 40 MG PO TBEC: 40.0000 mg | DELAYED_RELEASE_TABLET | Freq: Every day | ORAL | Status: DC

## 2018-07-03 MED ORDER — LABETALOL HCL 5 MG/ML IV SOLN: 5.0000 mg | INTRAVENOUS | Status: DC | PRN

## 2018-07-03 NOTE — NC FL2 (Signed)
[place] LEVEL OF CARE SCREENING TOOL     IDENTIFICATION  Patient Name: Ruth Sanchez Birthdate: 02/02/1946 Sex: female Admission Date (Current Location): 06/30/2018  [facility] and [place] Number:  Herbalist and Address:         Provider Number: [id number]  Attending Physician Name and Address:  Rosalin Hawking, MD  Relative Name and Phone Number:       Current Level of Care: Hospital Recommended Level of Care: [place] Prior Approval Number:    Date Approved/Denied:   PASRR Number:   [id number]   Discharge Plan: SNF    Current Diagnoses: Patient Active Problem List   Diagnosis Date Noted  . Acute ischemic stroke ([place]) 06/30/2018  . Middle cerebral artery embolism, left 06/30/2018  . Chronic diarrhea 03/29/2017  . Urinary incontinence 08/03/2015  . Essential hypertension 05/13/2015  . Chronic back pain 05/13/2015  . Acute bronchitis 02/23/2013  . Dyspnea 02/23/2013  . Paroxysmal atrial fibrillation ([place]) 02/23/2013  . Arthritis 02/23/2013  . Goiter with tracheal compression and mild stridor 02/23/2013  . Asthma, chronic 02/23/2013  . Hypokalemia 02/23/2013  . Multiple myeloma in remission ([place]) 11/18/2011    Orientation RESPIRATION BLADDER Height & Weight        Normal Incontinent Weight: 148 lb 13 oz (67.5 kg) Height:  5' (152.4 cm)  BEHAVIORAL SYMPTOMS/MOOD NEUROLOGICAL BOWEL NUTRITION STATUS        Feeding tube(Cortrak)  AMBULATORY STATUS COMMUNICATION OF NEEDS Skin   Extensive Assist Non-Verbally(aphasia) Normal                       Personal Care Assistance Level of Assistance  Bathing, Dressing Bathing Assistance: Maximum assistance   Dressing Assistance: Maximum assistance     Functional Limitations Info             SPECIAL CARE FACTORS FREQUENCY  PT (By licensed PT), OT (By licensed OT)     PT Frequency: 5 OT Frequency: 5            Contractures      Additional Factors Info   Code Status, Allergies Code Status Info: Full code  Allergies Info: GABAPENTIN, ASPIRIN            Current Medications (07/03/2018):  This is the current hospital active medication list Current Facility-Administered Medications  Medication Dose Route Frequency Provider Last Rate Last Dose  . 0.9 %  sodium chloride infusion   Intravenous Continuous Luanne Bras, MD   Stopped at 07/02/18 1707  . 0.9 %  sodium chloride infusion   Intravenous PRN Rosalin Hawking, MD   Stopped at 07/02/18 0002  . acetaminophen (TYLENOL) tablet 650 mg  650 mg Oral Q4H PRN Deveshwar, Willaim Rayas, MD       Or  . acetaminophen (TYLENOL) solution 650 mg  650 mg Per Tube Q4H PRN Deveshwar, Willaim Rayas, MD       Or  . acetaminophen (TYLENOL) suppository 650 mg  650 mg Rectal Q4H PRN Deveshwar, Sanjeev, MD      . chlorhexidine (PERIDEX) 0.12 % solution 15 mL  15 mL Mouth Rinse BID Rosalin Hawking, MD   15 mL at 07/03/18 0924  . clevidipine (CLEVIPREX) infusion 0.5 mg/mL  0-21 mg/hr Intravenous Continuous Rosalin Hawking, MD   Stopped at 07/01/18 1148  . famotidine (PEPCID) IVPB 20 mg premix  20 mg Intravenous Q12H Rosalin Hawking, MD 100 mL/hr at 07/03/18 0923 20 mg at 07/03/18 0923  . feeding  supplement (JEVITY 1.5 CAL/FIBER) liquid 1,000 mL  1,000 mL Per Tube Continuous Rosalin Hawking, MD 25 mL/hr at 07/02/18 1434 1,000 mL at 07/02/18 1434  . free water 30 mL  30 mL Per Tube Q4H Rosalin Hawking, MD   30 mL at 07/03/18 0551  . heparin injection 5,000 Units  5,000 Units Subcutaneous Q8H Rosalin Hawking, MD   5,000 Units at 07/03/18 0551  . insulin aspart (novoLOG) injection 0-9 Units  0-9 Units Subcutaneous Q4H Rosalin Hawking, MD      . MEDLINE mouth rinse  15 mL Mouth Rinse q12n4p Rosalin Hawking, MD   15 mL at 07/02/18 1523  . senna-docusate (Senokot-S) tablet 1 tablet  1 tablet Oral QHS PRN Amie [place], MD         Discharge Medications: Please see discharge summary for a list of discharge medications.  Relevant Imaging Results:  Relevant Lab  Results:   Additional Information  [id number]  Weston Anna, LCSW

## 2018-07-03 NOTE — Clinical Social Work Note (Signed)
Clinical Social Work Assessment  Patient Details  Name: Ruth Sanchez MRN: [id number] Date of Birth: 1945-10-25  Date of referral:  07/03/18               Reason for consult:  Facility Placement, Discharge Planning                Permission sought to share information with:    Permission granted to share information::     Name::        Agency::     Relationship::     Contact Information:     Housing/Transportation Living arrangements for the past 2 months:    Source of Information:  Adult Children(Demond ) Patient Interpreter Needed:  None Criminal Activity/Legal Involvement Pertinent to Current Situation/Hospitalization:    Significant Relationships:  Adult Children Lives with:  Adult Children Do you feel safe going back to the place where you live?  No Need for family participation in patient care:  Yes (Comment)  Care giving concerns:  Patients son, Demond, voiced concerns with patient needing long- term care and disposition plans.    Social Worker assessment / plan:  CSW spoke with patients son, Demond, via phone call to discuss PT recommendations for SNF placement at discharge. Patients son is agreeable to this plan however voices concern/ desire for patient needing long-term care once discharged from the hospital. Patient has been living with son and son's spouse for the past few years however son does not believe patient will be able to return with him at this time. CSW explained difference between SNF/ assisted living and needing to pay privately for long-term care until patient can be approved for Medicaid (if approved). Son voiced understanding. Son is agreeable to SNF placement at this time and will follow up with long-term placement from the community if needed. CSW will also show son online application for Medicaid- son states he is able to complete application ASAP.   Employment status:  Retired Forensic scientist:  Medicare PT Recommendations:  [place] / Referral to community resources:  [place]  Patient/Family's Response to care:  Patient son appreciated CSW.   Patient/Family's Understanding of and Emotional Response to Diagnosis, Current Treatment, and Prognosis:  Patient son understands current disposition plans.   Emotional Assessment Appearance:  Appears stated age Attitude/Demeanor/Rapport:    Affect (typically observed):  Unable to Assess Orientation:    Alcohol / Substance use:    Psych involvement (Current and /or in the community):  No (Comment)  Discharge Needs  Concerns to be addressed:  No discharge needs identified Readmission within the last 30 days:  No Current discharge risk:  None Barriers to Discharge:  No Barriers Identified   Weston Anna, LCSW 07/03/2018, 10:46 AM

## 2018-07-03 NOTE — Progress Notes (Signed)
STROKE TEAM PROGRESS NOTE   SUBJECTIVE (INTERVAL HISTORY) Her son and daughter in law are at the bedside.  She is awake but still has significant left MCA syndrome. MRI showed large left MCA infarct. Had cortrak placed on TF. Off cleviprex. Resume home meds. On plavix.     OBJECTIVE Temp:  [98.6 F (37 C)-99.9 F (37.7 C)] 99.1 F (37.3 C) (09/12 0800) Pulse Rate:  [33-115] 115 (09/12 1100) Cardiac Rhythm: Atrial fibrillation (09/12 0800) Resp:  [16-30] 22 (09/12 1100) BP: (125-181)/(65-119) 151/88 (09/12 1100) SpO2:  [96 %-100 %] 100 % (09/12 1100)  Recent Labs  Lab 07/02/18 1937 07/03/18 0000 07/03/18 0316 07/03/18 0739 07/03/18 1157  GLUCAP 111* 116* 102* 110* 150*   Recent Labs  Lab 06/30/18 1808  06/30/18 1812 07/01/18 0503 07/02/18 0347 07/03/18 0222  NA 142  --  142 140 142 139  K 2.9*  --  3.0* 2.8* 3.3* 3.2*  CL 106  --  108 108 111 108  CO2  --   --  25 19* 19* 21*  GLUCOSE 107*  --  108* 169* 99 113*  BUN 13  --  12 6* 8 9  CREATININE 0.70  --  0.78 0.68 0.72 0.69  CALCIUM  --    < > 9.6 8.4* 8.1* 7.9*   < > = values in this interval not displayed.   Recent Labs  Lab 06/30/18 1812  AST 23  ALT 13  ALKPHOS 107  BILITOT 1.3*  PROT 7.7  ALBUMIN 3.8   Recent Labs  Lab 06/30/18 1808 06/30/18 1812 07/02/18 0347 07/03/18 0222  WBC  --  8.4 19.8* 15.5*  NEUTROABS  --  6.2  --   --   HGB 12.9 12.5 10.9* 10.8*  HCT 38.0 40.3 33.8* 33.8*  MCV  --  93.5 91.8 91.4  PLT  --  125* 120* 120*   No results for input(s): CKTOTAL, CKMB, CKMBINDEX, TROPONINI in the last 168 hours. Recent Labs    06/30/18 1812  LABPROT 14.6  INR 1.15   Recent Labs    07/02/18 1323  COLORURINE STRAW*  LABSPEC 1.008  PHURINE 8.0  GLUCOSEU NEGATIVE  HGBUR SMALL*  BILIRUBINUR NEGATIVE  KETONESUR NEGATIVE  PROTEINUR NEGATIVE  NITRITE NEGATIVE  LEUKOCYTESUR NEGATIVE       Component Value Date/Time   CHOL 134 07/01/2018 0503   TRIG 74 07/01/2018 0503   HDL 57  07/01/2018 0503   CHOLHDL 2.4 07/01/2018 0503   VLDL 15 07/01/2018 0503   LDLCALC 62 07/01/2018 0503   Lab Results  Component Value Date   HGBA1C 5.3 07/01/2018      Component Value Date/Time   COCAINSCRNUR Negative 01/14/2017 1215    No results for input(s): ETH in the last 168 hours.  I have personally reviewed the radiological images below and agree with the radiology interpretations.  Ct Angio Head W Or Wo Contrast  Result Date: 06/30/2018 CLINICAL DATA:  Initial evaluation for acute stroke. EXAM: CT ANGIOGRAPHY HEAD AND NECK CT PERFUSION BRAIN TECHNIQUE: Multidetector CT imaging of the head and neck was performed using the standard protocol during bolus administration of intravenous contrast. Multiplanar CT image reconstructions and MIPs were obtained to evaluate the vascular anatomy. Carotid stenosis measurements (when applicable) are obtained utilizing NASCET criteria, using the distal internal carotid diameter as the denominator. Multiphase CT imaging of the brain was performed following IV bolus contrast injection. Subsequent parametric perfusion maps were calculated using RAPID software. CONTRAST:  161m ISOVUE-370  IOPAMIDOL (ISOVUE-370) INJECTION 76% COMPARISON:  Prior noncontrast CT from earlier the same day. FINDINGS: CTA NECK FINDINGS Aortic arch: Visualized aortic arch of normal caliber with normal branch pattern. No hemodynamically significant stenosis about the origin of the great vessels. Visualized subclavian arteries widely patent. Right carotid system: Right common carotid artery tortuous proximally but widely patent to the bifurcation. Mild atheromatous plaque about the proximal right ICA without stenosis. Right ICA widely patent distally to the skull base without stenosis, dissection, or occlusion Left carotid system: Left common carotid artery tortuous proximally but widely patent to the bifurcation without stenosis. Mild plaque about the left bifurcation without  hemodynamically significant stenosis. Left ICA patent distally to the skull base without stenosis, dissection, or occlusion. Vertebral arteries: Both of the vertebral arteries arise from the subclavian arteries. Vertebral arteries widely patent within the neck without stenosis, dissection, or occlusion. Skeleton: Innumerable lytic lesions seen diffusely throughout the visualized spine, compatible with history of multiple myeloma. No acute osseous abnormality. Other neck: Soft tissues of the neck demonstrate no acute finding. Enlarged goiter is change of the left lobe of thyroid with substernal extension. Upper chest: Visualized upper chest within normal limits. Partially visualized lungs are clear. Review of the MIP images confirms the above findings CTA HEAD FINDINGS Anterior circulation: Internal carotid arteries patent to the termini without hemodynamically significant stenosis. A1 segments patent. Anterior communicating artery normal. Anterior cerebral arteries patent to their distal aspects. Left M1 patent proximally. There is prepped occlusion of the distal left M1/proximal M2 at the left MCA bifurcation (series 9, image 96). Attenuated and reduced flow seen distally within left MCA branches. Right M1 patent. Normal right MCA bifurcation. Distal right MCA branches well perfused. Posterior circulation: Vertebral arteries diminutive but patent to the vertebrobasilar junction without stenosis. Left PICA patent. Right PICA not well visualized. Basilar diminutive but patent to its distal aspect without stenosis. Superior cerebral arteries patent proximally. Fetal type origin of the left PCA. Hypoplastic right P1 with robust right posterior communicating artery. PCAs patent to their distal aspects. Venous sinuses: Grossly patent, although not well assessed due to timing of the contrast bolus. Anatomic variants: Predominant fetal type origin of the PCAs. 4 mm focal outpouching arising from the right MCA bifurcation,  suspicious for aneurysm. Delayed phase: Not performed. Review of the MIP images confirms the above findings CT Brain Perfusion Findings: CBF (<30%) Volume: 45m Perfusion (Tmax>6.0s) volume: 979mMismatch Volume: 6988mnfarction Location:Anterior left MCA distribution, involving the left insular region and operculum. IMPRESSION: 1. Positive CTA with EVLO of distal left M1/proximal M2 branch at the base of the sylvian fissure. Acute left MCA territory infarct with surrounding penumbra as above. 2. Additional mild atheromatous change throughout the major arterial vasculature of the head and neck as above. No other hemodynamically significant or correctable identified. 3. 4 mm right MCA bifurcation aneurysm. 4. Innumerable lucent lesions throughout the visualized skeleton, compatible with multiple myeloma. Findings discussed with Dr. AroRory Percy approximately 5:45 p.m. on 06/30/2018. Electronically Signed   By: BenJeannine BogaD.   On: 06/30/2018 19:21   Ct Angio Neck W Or Wo Contrast  Result Date: 06/30/2018 CLINICAL DATA:  Initial evaluation for acute stroke. EXAM: CT ANGIOGRAPHY HEAD AND NECK CT PERFUSION BRAIN TECHNIQUE: Multidetector CT imaging of the head and neck was performed using the standard protocol during bolus administration of intravenous contrast. Multiplanar CT image reconstructions and MIPs were obtained to evaluate the vascular anatomy. Carotid stenosis measurements (when applicable) are obtained utilizing NASCET criteria,  using the distal internal carotid diameter as the denominator. Multiphase CT imaging of the brain was performed following IV bolus contrast injection. Subsequent parametric perfusion maps were calculated using RAPID software. CONTRAST:  123m ISOVUE-370 IOPAMIDOL (ISOVUE-370) INJECTION 76% COMPARISON:  Prior noncontrast CT from earlier the same day. FINDINGS: CTA NECK FINDINGS Aortic arch: Visualized aortic arch of normal caliber with normal branch pattern. No  hemodynamically significant stenosis about the origin of the great vessels. Visualized subclavian arteries widely patent. Right carotid system: Right common carotid artery tortuous proximally but widely patent to the bifurcation. Mild atheromatous plaque about the proximal right ICA without stenosis. Right ICA widely patent distally to the skull base without stenosis, dissection, or occlusion Left carotid system: Left common carotid artery tortuous proximally but widely patent to the bifurcation without stenosis. Mild plaque about the left bifurcation without hemodynamically significant stenosis. Left ICA patent distally to the skull base without stenosis, dissection, or occlusion. Vertebral arteries: Both of the vertebral arteries arise from the subclavian arteries. Vertebral arteries widely patent within the neck without stenosis, dissection, or occlusion. Skeleton: Innumerable lytic lesions seen diffusely throughout the visualized spine, compatible with history of multiple myeloma. No acute osseous abnormality. Other neck: Soft tissues of the neck demonstrate no acute finding. Enlarged goiter is change of the left lobe of thyroid with substernal extension. Upper chest: Visualized upper chest within normal limits. Partially visualized lungs are clear. Review of the MIP images confirms the above findings CTA HEAD FINDINGS Anterior circulation: Internal carotid arteries patent to the termini without hemodynamically significant stenosis. A1 segments patent. Anterior communicating artery normal. Anterior cerebral arteries patent to their distal aspects. Left M1 patent proximally. There is prepped occlusion of the distal left M1/proximal M2 at the left MCA bifurcation (series 9, image 96). Attenuated and reduced flow seen distally within left MCA branches. Right M1 patent. Normal right MCA bifurcation. Distal right MCA branches well perfused. Posterior circulation: Vertebral arteries diminutive but patent to the  vertebrobasilar junction without stenosis. Left PICA patent. Right PICA not well visualized. Basilar diminutive but patent to its distal aspect without stenosis. Superior cerebral arteries patent proximally. Fetal type origin of the left PCA. Hypoplastic right P1 with robust right posterior communicating artery. PCAs patent to their distal aspects. Venous sinuses: Grossly patent, although not well assessed due to timing of the contrast bolus. Anatomic variants: Predominant fetal type origin of the PCAs. 4 mm focal outpouching arising from the right MCA bifurcation, suspicious for aneurysm. Delayed phase: Not performed. Review of the MIP images confirms the above findings CT Brain Perfusion Findings: CBF (<30%) Volume: 252mPerfusion (Tmax>6.0s) volume: 9821mismatch Volume: 25m105mfarction Location:Anterior left MCA distribution, involving the left insular region and operculum. IMPRESSION: 1. Positive CTA with EVLO of distal left M1/proximal M2 branch at the base of the sylvian fissure. Acute left MCA territory infarct with surrounding penumbra as above. 2. Additional mild atheromatous change throughout the major arterial vasculature of the head and neck as above. No other hemodynamically significant or correctable identified. 3. 4 mm right MCA bifurcation aneurysm. 4. Innumerable lucent lesions throughout the visualized skeleton, compatible with multiple myeloma. Findings discussed with Dr. ArorRory Percyapproximately 5:45 p.m. on 06/30/2018. Electronically Signed   By: BenjJeannine Boga.   On: 06/30/2018 19:21   Ct Cerebral Perfusion W Contrast  Result Date: 06/30/2018 CLINICAL DATA:  Initial evaluation for acute stroke. EXAM: CT ANGIOGRAPHY HEAD AND NECK CT PERFUSION BRAIN TECHNIQUE: Multidetector CT imaging of the head and neck was performed  using the standard protocol during bolus administration of intravenous contrast. Multiplanar CT image reconstructions and MIPs were obtained to evaluate the vascular  anatomy. Carotid stenosis measurements (when applicable) are obtained utilizing NASCET criteria, using the distal internal carotid diameter as the denominator. Multiphase CT imaging of the brain was performed following IV bolus contrast injection. Subsequent parametric perfusion maps were calculated using RAPID software. CONTRAST:  172m ISOVUE-370 IOPAMIDOL (ISOVUE-370) INJECTION 76% COMPARISON:  Prior noncontrast CT from earlier the same day. FINDINGS: CTA NECK FINDINGS Aortic arch: Visualized aortic arch of normal caliber with normal branch pattern. No hemodynamically significant stenosis about the origin of the great vessels. Visualized subclavian arteries widely patent. Right carotid system: Right common carotid artery tortuous proximally but widely patent to the bifurcation. Mild atheromatous plaque about the proximal right ICA without stenosis. Right ICA widely patent distally to the skull base without stenosis, dissection, or occlusion Left carotid system: Left common carotid artery tortuous proximally but widely patent to the bifurcation without stenosis. Mild plaque about the left bifurcation without hemodynamically significant stenosis. Left ICA patent distally to the skull base without stenosis, dissection, or occlusion. Vertebral arteries: Both of the vertebral arteries arise from the subclavian arteries. Vertebral arteries widely patent within the neck without stenosis, dissection, or occlusion. Skeleton: Innumerable lytic lesions seen diffusely throughout the visualized spine, compatible with history of multiple myeloma. No acute osseous abnormality. Other neck: Soft tissues of the neck demonstrate no acute finding. Enlarged goiter is change of the left lobe of thyroid with substernal extension. Upper chest: Visualized upper chest within normal limits. Partially visualized lungs are clear. Review of the MIP images confirms the above findings CTA HEAD FINDINGS Anterior circulation: Internal carotid  arteries patent to the termini without hemodynamically significant stenosis. A1 segments patent. Anterior communicating artery normal. Anterior cerebral arteries patent to their distal aspects. Left M1 patent proximally. There is prepped occlusion of the distal left M1/proximal M2 at the left MCA bifurcation (series 9, image 96). Attenuated and reduced flow seen distally within left MCA branches. Right M1 patent. Normal right MCA bifurcation. Distal right MCA branches well perfused. Posterior circulation: Vertebral arteries diminutive but patent to the vertebrobasilar junction without stenosis. Left PICA patent. Right PICA not well visualized. Basilar diminutive but patent to its distal aspect without stenosis. Superior cerebral arteries patent proximally. Fetal type origin of the left PCA. Hypoplastic right P1 with robust right posterior communicating artery. PCAs patent to their distal aspects. Venous sinuses: Grossly patent, although not well assessed due to timing of the contrast bolus. Anatomic variants: Predominant fetal type origin of the PCAs. 4 mm focal outpouching arising from the right MCA bifurcation, suspicious for aneurysm. Delayed phase: Not performed. Review of the MIP images confirms the above findings CT Brain Perfusion Findings: CBF (<30%) Volume: 256mPerfusion (Tmax>6.0s) volume: 9863mismatch Volume: 51m82mfarction Location:Anterior left MCA distribution, involving the left insular region and operculum. IMPRESSION: 1. Positive CTA with EVLO of distal left M1/proximal M2 branch at the base of the sylvian fissure. Acute left MCA territory infarct with surrounding penumbra as above. 2. Additional mild atheromatous change throughout the major arterial vasculature of the head and neck as above. No other hemodynamically significant or correctable identified. 3. 4 mm right MCA bifurcation aneurysm. 4. Innumerable lucent lesions throughout the visualized skeleton, compatible with multiple myeloma.  Findings discussed with Dr. ArorRory Percyapproximately 5:45 p.m. on 06/30/2018. Electronically Signed   By: BenjJeannine Boga.   On: 06/30/2018 19:21   Ct Head Code  Stroke Wo Contrast  Result Date: 06/30/2018 CLINICAL DATA:  Code stroke. Initial evaluation for acute right-sided gaze. EXAM: CT HEAD WITHOUT CONTRAST TECHNIQUE: Contiguous axial images were obtained from the base of the skull through the vertex without intravenous contrast. COMPARISON:  Prior CT from 11/24/2015. FINDINGS: Brain: Age-related cerebral atrophy with chronic small vessel ischemic disease. Remote lacunar infarct present within the right basal ganglia. Subtle hypodensity seen involving the left insular ribbon, consistent with evolving acute ischemic left MCA territory infarct. No acute intracranial hemorrhage. No mass lesion, midline shift or mass effect. No hydrocephalus. No extra-axial fluid collection. Vascular: No definite hyperdense vessel identified. Scattered vascular calcifications noted within the carotid siphons. Skull: Scalp soft tissues within normal limits. Multiple lucent lesion seen throughout the calvarium, consistent with multiple myeloma. Sinuses/Orbits: Left-sided gaze noted. Paranasal sinuses and mastoid air cells are clear. Other: None. ASPECTS [facility] Stroke Program Early CT Score) - Ganglionic level infarction (caudate, lentiform nuclei, internal capsule, insula, M1-M3 cortex): 6 - Supraganglionic infarction (M4-M6 cortex): 3 Total score (0-10 with 10 being normal): 9 IMPRESSION: 1. Abnormal hypodensity involving the left insular cortex, suspicious for acute left MCA territory infarct. 2. ASPECTS is 9. These results were communicated to Dr. Rory Percy at 6:25 pmon 9/9/2019by text page via the [facility] messaging system. Electronically Signed   By: Jeannine Boga M.D.   On: 06/30/2018 18:25   Mr Jodene Nam Head Wo Contrast  Result Date: 07/01/2018 CLINICAL DATA:  Follow-up examination for acute stroke. EXAM: MRI HEAD  WITHOUT CONTRAST MRA HEAD WITHOUT CONTRAST TECHNIQUE: Multiplanar, multiecho pulse sequences of the brain and surrounding structures were obtained without intravenous contrast. Angiographic images of the head were obtained using MRA technique without contrast. COMPARISON:  Prior CTA perfusion from 06/30/2018. FINDINGS: MRI HEAD FINDINGS Brain: Examination severely degraded by motion artifact. Large confluent area of restricted diffusion seen involving the majority of the left MCA territory, with involvement of the left frontal, parietal, and temporal lobes, consistent with acute left MCA territory infarct. Associated localized gyral swelling and edema without significant regional mass effect at this time. Evidence for petechial hemorrhage superior to the left insula noted (series 10, image 69). No frank hemorrhagic transformation. Additional susceptibility artifact within the left sylvian fissure favored to be related to persistent intravascular thrombus. Remote right basal ganglia lacunar infarct with associated chronic hemosiderin staining noted. Additional chronic hemosiderin staining noted at the right parietal lobe as well. Underlying age-related cerebral atrophy with mild chronic small vessel ischemic disease. No mass lesion or midline shift. No hydrocephalus. No extra-axial fluid collection. Pituitary gland normal. Vascular: Susceptibility artifact within the left sylvian fissure, most likely intraluminal arterial thrombus within left MCA branches. Major intracranial vascular flow voids otherwise maintained at the skull base. Skull and upper cervical spine: Craniocervical junction normal. Diffuse osseous changes related to multiple myeloma noted. No scalp soft tissue abnormality. Sinuses/Orbits: Globes and orbital soft tissues within normal limits. Paranasal sinuses are largely clear. No mastoid effusion. Inner ear structures grossly normal. Other: None. MRA HEAD FINDINGS ANTERIOR CIRCULATION: Examination  severely degraded by motion artifact. Distal cervical segments of the internal carotid arteries are patent with antegrade flow. Petrous, cavernous, and supraclinoid segments patent without hemodynamically significant stenosis. A1 segments, anterior communicating artery, and anterior cerebral arteries grossly patent without stenosis. Proximal and mid left M1 patent. There is persistent occlusion of distal left M1/proximal M2 at the base of the sylvian fissure (series 4, image 60). Persistent flow within the anterior temporal branch. No other significant flow within the left  MCA territory. Right M1 widely patent without stenosis. No proximal right M2 occlusion. Distal right MCA branches grossly perfused. POSTERIOR CIRCULATION: Vertebral arteries grossly patent to the vertebrobasilar junction without stenosis. Posterior inferior cerebral arteries patent bilaterally. Basilar patent to its distal aspect without obvious stenosis. Superior cerebral arteries patent bilaterally. PCAs grossly patent to their distal aspects without obvious stenosis. IMPRESSION: MRI HEAD IMPRESSION: 1. Large evolving acute left MCA territory infarct involving the majority of the left MCA territory. Associated petechial hemorrhage anteriorly without frank hemorrhagic transformation. Localized gyral swelling and edema without significant mass effect at this time. 2. Chronic right basal ganglia lacunar infarct. 3. Underlying mild chronic small vessel ischemic disease. MRA HEAD IMPRESSION: 1. Persistent distal left M1/proximal M2 occlusion with evidence for intraluminal thrombus in the left sylvian fissure. 2. Otherwise grossly stable intracranial MRA with no other large vessel occlusion or hemodynamically significant stenosis. Electronically Signed   By: Jeannine Boga M.D.   On: 07/01/2018 18:00   Mr Brain Wo Contrast  Result Date: 07/01/2018 CLINICAL DATA:  Follow-up examination for acute stroke. EXAM: MRI HEAD WITHOUT CONTRAST MRA  HEAD WITHOUT CONTRAST TECHNIQUE: Multiplanar, multiecho pulse sequences of the brain and surrounding structures were obtained without intravenous contrast. Angiographic images of the head were obtained using MRA technique without contrast. COMPARISON:  Prior CTA perfusion from 06/30/2018. FINDINGS: MRI HEAD FINDINGS Brain: Examination severely degraded by motion artifact. Large confluent area of restricted diffusion seen involving the majority of the left MCA territory, with involvement of the left frontal, parietal, and temporal lobes, consistent with acute left MCA territory infarct. Associated localized gyral swelling and edema without significant regional mass effect at this time. Evidence for petechial hemorrhage superior to the left insula noted (series 10, image 69). No frank hemorrhagic transformation. Additional susceptibility artifact within the left sylvian fissure favored to be related to persistent intravascular thrombus. Remote right basal ganglia lacunar infarct with associated chronic hemosiderin staining noted. Additional chronic hemosiderin staining noted at the right parietal lobe as well. Underlying age-related cerebral atrophy with mild chronic small vessel ischemic disease. No mass lesion or midline shift. No hydrocephalus. No extra-axial fluid collection. Pituitary gland normal. Vascular: Susceptibility artifact within the left sylvian fissure, most likely intraluminal arterial thrombus within left MCA branches. Major intracranial vascular flow voids otherwise maintained at the skull base. Skull and upper cervical spine: Craniocervical junction normal. Diffuse osseous changes related to multiple myeloma noted. No scalp soft tissue abnormality. Sinuses/Orbits: Globes and orbital soft tissues within normal limits. Paranasal sinuses are largely clear. No mastoid effusion. Inner ear structures grossly normal. Other: None. MRA HEAD FINDINGS ANTERIOR CIRCULATION: Examination severely degraded by  motion artifact. Distal cervical segments of the internal carotid arteries are patent with antegrade flow. Petrous, cavernous, and supraclinoid segments patent without hemodynamically significant stenosis. A1 segments, anterior communicating artery, and anterior cerebral arteries grossly patent without stenosis. Proximal and mid left M1 patent. There is persistent occlusion of distal left M1/proximal M2 at the base of the sylvian fissure (series 4, image 60). Persistent flow within the anterior temporal branch. No other significant flow within the left MCA territory. Right M1 widely patent without stenosis. No proximal right M2 occlusion. Distal right MCA branches grossly perfused. POSTERIOR CIRCULATION: Vertebral arteries grossly patent to the vertebrobasilar junction without stenosis. Posterior inferior cerebral arteries patent bilaterally. Basilar patent to its distal aspect without obvious stenosis. Superior cerebral arteries patent bilaterally. PCAs grossly patent to their distal aspects without obvious stenosis. IMPRESSION: MRI HEAD IMPRESSION: 1. Large evolving acute  left MCA territory infarct involving the majority of the left MCA territory. Associated petechial hemorrhage anteriorly without frank hemorrhagic transformation. Localized gyral swelling and edema without significant mass effect at this time. 2. Chronic right basal ganglia lacunar infarct. 3. Underlying mild chronic small vessel ischemic disease. MRA HEAD IMPRESSION: 1. Persistent distal left M1/proximal M2 occlusion with evidence for intraluminal thrombus in the left sylvian fissure. 2. Otherwise grossly stable intracranial MRA with no other large vessel occlusion or hemodynamically significant stenosis. Electronically Signed   By: Jeannine Boga M.D.   On: 07/01/2018 18:00   Dg Chest Port 1 View  Result Date: 07/02/2018 CLINICAL DATA:  Shortness of breath, cough, history of asthma, hypertension, acute CVA. EXAM: PORTABLE CHEST 1 VIEW  COMPARISON:  PA and lateral chest x-rays of July 01, 2018 and December 10, 2016. FINDINGS: The patient is rotated toward the right. The lungs are adequately inflated. The interstitial markings are coarse at both bases. The cardiac silhouette is enlarged. The pulmonary vascularity is normal. Old right lateral rib fractures are present and stable. There are degenerative changes of both shoulders. IMPRESSION: Persistent bibasilar atelectasis. Mild enlargement of the cardiac silhouette without pulmonary vascular congestion or pulmonary edema. Electronically Signed   By: David  Martinique M.D.   On: 07/02/2018 11:48   TTE - Left ventricle: The cavity size was normal. Wall thickness was   normal. Systolic function was normal. The estimated ejection   fraction was in the range of 55% to 60%. Wall motion was normal;   there were no regional wall motion abnormalities. - Aortic valve: There was trivial regurgitation. - Mitral valve: There was mild regurgitation. - Left atrium: The atrium was severely dilated. - Right atrium: The atrium was mildly to moderately dilated. - Tricuspid valve: There was moderate regurgitation. - Pulmonary arteries: Systolic pressure was mildly increased. PA   peak pressure: 33 mm Hg (S).    PHYSICAL EXAM  Temp:  [98.6 F (37 C)-99.9 F (37.7 C)] 99.1 F (37.3 C) (09/12 0800) Pulse Rate:  [33-115] 115 (09/12 1100) Resp:  [16-30] 22 (09/12 1100) BP: (125-181)/(65-119) 151/88 (09/12 1100) SpO2:  [96 %-100 %] 100 % (09/12 1100)  General - Well nourished, well developed, not in acute distress.  Ophthalmologic - fundi not visualized due to noncooperation.  Cardiovascular - irregularly irregular heart rate and rhythm.  Neuro - eyes open, still global aphasia, not following commands, no language output. Not repeating or naming. Left forced gaze, right neglect, not blinking to visual threat on the right. PERRL, not tracking on the right. RUE 0/5 and RLE mild withdraw on  pain stimulation. LUE and LLE spontaneous movement and against gravity. Sensation, coordination and gait not tested.   ASSESSMENT/PLAN Ms. REEANNA ACRI is a 72 y.o. female with history of HTN, MM in remission admitted for left gaze, aphasia and right weakness. No tPA given due to OSW.    Stroke:  left MCA large infarct due to left M1 occlusion, embolic secondary to new diagnosis of afib  Resultant full blown of left MCA syndrome  CT old right CR infarct  CTA head and neck left M1 M2 bifurcation occlusion  DSA - left M2 occlusion with initial TICI2b reperfusion but then re-occluded. Unsuccessful intervention  MRI confirmed left large MCA infarct  MRA persistent left M1 occlusion  2D Echo EF 55-60%  LDL 62  HgbA1c 5.3  SCDs for VTE prophylaxis  NPO   No antithrombotic prior to admission, now on plavix after po access. ASA  allergy noted. Will consider DOACs in 14 days if neuro stable.   Ongoing aggressive stroke risk factor management  Therapy recommendations:  Pending   Disposition:  Pending   Afib - new diagnosis  Tele and EKG confirmed afib rhythm  Intermittent RVR, resumed coreg 12.75m bid  Close HR monitoring  Likely the cause of current stroke  May consider DOACs in 14 days if neuro stable  Dysphagia  Did not pass swallow  Had cortrak placed  On TF  Need to consider PEG early next week for SNF placement  Leukocytosis  WBC 8.4->19.8->15.5  UA neg  CXR 07/01/18 no active disease  CXR 07/02/18 no active disease  Afebrile  Close CBC monitoring  Hypertension Stable off cleviprex BP goal < 160 now On home meds with norvasc and coreg  Long term BP goal 130-150 given left M1 persistent occlusion  Other Stroke Risk Factors  Advanced age  Other Active Problems  MM in remission  Hospital day # 3  This patient is critically ill due to left MCA large stroke, s/p IR, leukocytosis, afib and at significant risk of neurological worsening,  death form recurrent stroke, hemorrhagic conversion, heart failure, seizure, cerebral edema. This patient's care requires constant monitoring of vital signs, hemodynamics, respiratory and cardiac monitoring, review of multiple databases, neurological assessment, discussion with family, other specialists and medical decision making of high complexity. I spent 35 minutes of neurocritical care time in the care of this patient. I had long discussion with son at bedside, updated pt current condition, treatment plan and potential prognosis. He expressed understanding and appreciation.    JRosalin Hawking MD PhD Stroke Neurology 07/03/2018 12:04 PM    To contact Stroke Continuity provider, please refer to [email or web address] After hours, contact General Neurology

## 2018-07-03 NOTE — Progress Notes (Signed)
  Speech Language Pathology Treatment: Dysphagia;Cognitive-Linquistic  Patient Details Name: Ruth Sanchez MRN: [id number] DOB: 09/17/1946 Today's Date: 07/03/2018 Time: 1310-1330 SLP Time Calculation (min) (ACUTE ONLY): 20 min  Assessment / Plan / Recommendation Clinical Impression  Pt demonstrates improved automaticity with PO intake, improving awareness. When gestures and context given, pt able to participate by taking cup, initiating sips and picking up objects. Sips of water elicited coughing, suspect delayed swallow initiation. Trials of nectar and puree tolerated very well. Hopeful for diet intiation tomorrow, may need to proceed with MBS. Asked RN to give sips of nectar thick liquids and bites of applesauce the rest of the shift to improve awareness.   HPI HPI: 72 year old female admitted 06/30/18 with stroke symptoms. PMH: arthritis, asthma, HTN. MRI pending.      SLP Plan  Continue with current plan of care       Recommendations  Diet recommendations: Other(comment)(sips of honey/nectar with RN, bites of puree) Liquids provided via: Cup Medication Administration: Crushed with puree Supervision: Staff to assist with self feeding;Full supervision/cueing for compensatory strategies                General recommendations: Rehab consult Oral Care Recommendations: Oral care BID Follow up Recommendations: Skilled Nursing facility SLP Visit Diagnosis: Dysphagia, unspecified (R13.10) Plan: Continue with current plan of care       GO               [facility], [place] CCC-SLP [phone]   Lynann Beaver 07/03/2018, 1:54 PM

## 2018-07-03 NOTE — Progress Notes (Signed)
Physical Therapy Treatment Patient Details Name: Ruth Sanchez MRN: [id number] DOB: [birth year] Today's Date: 07/03/2018    History of Present Illness Pt is a 72 y.o. female with PMH significant for HTN, arthritis, asthma, and cancer who presented with L gaze, aphasia, and R sided weakness. Pt is s/p proximal inferior MCA revascularization in IR 06/30/18.     PT Comments    Slow improvements.  Pt is able to draw attention closer toward midline.  Still does not follow any commands or focus on task.  Plan is to do similar activities that pt can follow by repetition.  Emphasis on transition to EOB, sitting balance, sit to stand/ standing activity and transfers to the chair.    Follow Up Recommendations  SNF     Equipment Recommendations  Other (comment)    Recommendations for Other Services       Precautions / Restrictions Precautions Precautions: Fall Precaution Comments: R sided weakness; L gaze    Mobility  Bed Mobility Overal bed mobility: Needs Assistance Bed Mobility: Rolling;Sidelying to Sit Rolling: Max assist;+2 for physical assistance Sidelying to sit: Mod assist;Max assist;+2 for physical assistance       General bed mobility comments: Little initiation of the roll given v/t cues and significant assist need to transition up via L elbow.  Transfers Overall transfer level: Needs assistance Equipment used: Rolling walker (2 wheeled) Transfers: Squat Pivot Transfers Sit to Stand: Max assist;+2 physical assistance   Squat pivot transfers: Max assist;+2 physical assistance     General transfer comment: pt's L LE guarded during the stand and pivot to chair.  R side was assisted and support in the stand and transfer.  Ambulation/Gait                 Stairs             Wheelchair Mobility    Modified Rankin (Stroke Patients Only) Modified Rankin (Stroke Patients Only) Pre-Morbid Rankin Score: No symptoms Modified Rankin: Severe disability      Balance Overall balance assessment: Needs assistance   Sitting balance-Leahy Scale: Poor Sitting balance - Comments: sat EOB  for >10 min work on balance in general and midline orientation specifically.     Standing balance-Leahy Scale: Zero Standing balance comment: Relies on assistance from staff.                             Cognition Arousal/Alertness: Awake/alert Behavior During Therapy: Flat affect Overall Cognitive Status: Impaired/Different from baseline                     Current Attention Level: Focused   Following Commands: Follows one step commands inconsistently              Exercises Other Exercises Other Exercises: warm up LE ex bil with stiff R LE knee/hip flexion.    General Comments General comments (skin integrity, edema, etc.): pt left in chair on a lift pad for return to the bed later.  VSS      Pertinent Vitals/Pain Faces Pain Scale: Hurts even more Pain Location: back and all over Pain Descriptors / Indicators: Sore;Grimacing Pain Intervention(s): Monitored during session;Repositioned    Home Living                      Prior Function            PT Goals (current goals can now be  found in the care plan section) Acute Rehab PT Goals Patient Stated Goal: unable to state PT Goal Formulation: With patient/family Time For Goal Achievement: 07/15/18 Potential to Achieve Goals: Fair Progress towards PT goals: Progressing toward goals    Frequency    Min 3X/week      PT Plan Current plan remains appropriate    Co-evaluation              AM-PAC PT "6 Clicks" Daily Activity  Outcome Measure  Difficulty turning over in bed (including adjusting bedclothes, sheets and blankets)?: Unable Difficulty moving from lying on back to sitting on the side of the bed? : Unable Difficulty sitting down on and standing up from a chair with arms (e.g., wheelchair, bedside commode, etc,.)?: Unable Help needed moving  to and from a bed to chair (including a wheelchair)?: Total Help needed walking in hospital room?: Total Help needed climbing 3-5 steps with a railing? : Total 6 Click Score: 6    End of Session   Activity Tolerance: Patient tolerated treatment well;Patient limited by pain Patient left: in chair;with call bell/phone within reach;with family/visitor present Nurse Communication: Mobility status PT Visit Diagnosis: Muscle weakness (generalized) (M62.81);Hemiplegia and hemiparesis Hemiplegia - Right/Left: Right Hemiplegia - dominant/non-dominant: Dominant Hemiplegia - caused by: Cerebral infarction     Time: 1525-1600 PT Time Calculation (min) (ACUTE ONLY): 35 min  Charges:  $Therapeutic Activity: 23-37 mins                     07/03/2018  Donnella Sham, PT Acute Rehabilitation Services [phone]  (pager) [phone]  (office)   Tessie Fass Cythnia Osmun 07/03/2018, 5:53 PM

## 2018-07-04 ENCOUNTER — Inpatient Hospital Stay (HOSPITAL_COMMUNITY): Payer: Medicare Other

## 2018-07-04 LAB — BASIC METABOLIC PANEL
ANION GAP: 8 (ref 5–15)
BUN: 10 mg/dL (ref 8–23)
CO2: 20 mmol/L — ABNORMAL LOW (ref 22–32)
Calcium: 8.3 mg/dL — ABNORMAL LOW (ref 8.9–10.3)
Chloride: 113 mmol/L — ABNORMAL HIGH (ref 98–111)
Creatinine, Ser: 0.65 mg/dL (ref 0.44–1.00)
GLUCOSE: 129 mg/dL — AB (ref 70–99)
POTASSIUM: 3.5 mmol/L (ref 3.5–5.1)
Sodium: 141 mmol/L (ref 135–145)

## 2018-07-04 LAB — CBC
HCT: 36 % (ref 36.0–46.0)
Hemoglobin: 11.7 g/dL — ABNORMAL LOW (ref 12.0–15.0)
MCH: 29.5 pg (ref 26.0–34.0)
MCHC: 32.5 g/dL (ref 30.0–36.0)
MCV: 90.7 fL (ref 78.0–100.0)
PLATELETS: 134 10*3/uL — AB (ref 150–400)
RBC: 3.97 MIL/uL (ref 3.87–5.11)
RDW: 13.2 % (ref 11.5–15.5)
WBC: 16 10*3/uL — AB (ref 4.0–10.5)

## 2018-07-04 LAB — GLUCOSE, CAPILLARY
GLUCOSE-CAPILLARY: 124 mg/dL — AB (ref 70–99)
GLUCOSE-CAPILLARY: 125 mg/dL — AB (ref 70–99)
GLUCOSE-CAPILLARY: 101 mg/dL — AB (ref 70–99)
GLUCOSE-CAPILLARY: 117 mg/dL — AB (ref 70–99)
Glucose-Capillary: 119 mg/dL — ABNORMAL HIGH (ref 70–99)

## 2018-07-04 MED ORDER — RESOURCE THICKENUP CLEAR PO POWD
ORAL | Status: DC | PRN
  Filled 2018-07-04 (×2): qty 125

## 2018-07-04 NOTE — Progress Notes (Signed)
Modified Barium Swallow Progress Note  Patient Details  Name: Ruth Sanchez MRN: [id number] Date of Birth: 1945/10/25  Today's Date: 07/04/2018  Modified Barium Swallow completed.  Full report located under Chart Review in the Imaging Section.  Brief recommendations include the following:  Clinical Impression  Pt demonstrates slow oral manipulation with max hand over hand assist needed to elicit automatic responses in MBS setting (more so than at bedside). Oral holding observed with puree and mechaonical soft solids. Liquids that spill to the vestibule more readily trigger full oral transit and swallow reponse, but nectar is aspirated without sensation prior to the swallow. Honey is tolerated quite well and there are no pharyngeal residuals. Recommend pt initaite a puree diet with honey thick liquids. Expect improved tolerance with more natural meal setting. If pt consumes adequate quantities over the next 24-48 hourse could consider NG tube removal.    Swallow Evaluation Recommendations       SLP Diet Recommendations: Dysphagia 1 (Puree) solids;Honey thick liquids   Liquid Administration via: Cup   Medication Administration: Crushed with puree   Supervision: Full supervision/cueing for compensatory strategies   Compensations: Slow rate;Small sips/bites   Postural Changes: Seated upright at 90 degrees       Other Recommendations: Have oral suction available   Herbie [place], MA CCC-SLP [phone]  Lynann Beaver 07/04/2018,2:32 PM

## 2018-07-04 NOTE — Progress Notes (Addendum)
STROKE TEAM PROGRESS NOTE   SUBJECTIVE (INTERVAL HISTORY) Her son and daughter in law are at the bedside.  Patient up in chair. No new complaints. For MBSS today. If not placed on a diet, will need PEG. Family understands and agrees.  OBJECTIVE Temp:  [97.7 F (36.5 C)-98.7 F (37.1 C)] 97.7 F (36.5 C) (09/13 1612) Pulse Rate:  [85-110] 94 (09/13 1612) Cardiac Rhythm: Atrial fibrillation (09/13 0700) Resp:  [15-20] 15 (09/13 1612) BP: (136-174)/(81-112) 136/83 (09/13 1612) SpO2:  [99 %-100 %] 99 % (09/13 1612) Weight:  [68.7 kg] 68.7 kg (09/13 0423)  Recent Labs  Lab 07/03/18 2350 07/04/18 0422 07/04/18 0747 07/04/18 1148 07/04/18 1545  GLUCAP 87 124* 125* 119* 101*   Recent Labs  Lab 06/30/18 1812 07/01/18 0503 07/02/18 0347 07/03/18 0222 07/04/18 0541  NA 142 140 142 139 141  K 3.0* 2.8* 3.3* 3.2* 3.5  CL 108 108 111 108 113*  CO2 25 19* 19* 21* 20*  GLUCOSE 108* 169* 99 113* 129*  BUN 12 6* _0 CREATININE 0.78 0.68 0.72 0.69 0.65  CALCIUM 9.6 8.4* 8.1* 7.9* 8.3*   Recent Labs  Lab 06/30/18 1812  AST 23  ALT 13  ALKPHOS 107  BILITOT 1.3*  PROT 7.7  ALBUMIN 3.8   Recent Labs  Lab 06/30/18 1808 06/30/18 1812 07/02/18 0347 07/03/18 0222 07/04/18 0541  WBC  --  8.4 19.8* 15.5* 16.0*  NEUTROABS  --  6.2  --   --   --   HGB 12.9 12.5 10.9* 10.8* 11.7*  HCT 38.0 40.3 33.8* 33.8* 36.0  MCV  --  93.5 91.8 91.4 90.7  PLT  --  125* 120* 120* 134*   No results for input(s): CKTOTAL, CKMB, CKMBINDEX, TROPONINI in the last 168 hours. No results for input(s): LABPROT, INR in the last 72 hours. Recent Labs    07/02/18 1323  COLORURINE STRAW*  LABSPEC 1.008  PHURINE 8.0  GLUCOSEU NEGATIVE  HGBUR SMALL*  BILIRUBINUR NEGATIVE  KETONESUR NEGATIVE  PROTEINUR NEGATIVE  NITRITE NEGATIVE  LEUKOCYTESUR NEGATIVE       Component Value Date/Time   CHOL 134 07/01/2018 0503   TRIG 74 07/01/2018 0503   HDL 57 07/01/2018 0503   CHOLHDL 2.4 07/01/2018 0503    VLDL 15 07/01/2018 0503   LDLCALC 62 07/01/2018 0503   Lab Results  Component Value Date   HGBA1C 5.3 07/01/2018      Component Value Date/Time   COCAINSCRNUR Negative 01/14/2017 1215    No results for input(s): ETH in the last 168 hours.  Ct Angio Head W Or Wo Contrast  Result Date: 06/30/2018 CLINICAL DATA:  Initial evaluation for acute stroke. EXAM: CT ANGIOGRAPHY HEAD AND NECK CT PERFUSION BRAIN TECHNIQUE: Multidetector CT imaging of the head and neck was performed using the standard protocol during bolus administration of intravenous contrast. Multiplanar CT image reconstructions and MIPs were obtained to evaluate the vascular anatomy. Carotid stenosis measurements (when applicable) are obtained utilizing NASCET criteria, using the distal internal carotid diameter as the denominator. Multiphase CT imaging of the brain was performed following IV bolus contrast injection. Subsequent parametric perfusion maps were calculated using RAPID software. CONTRAST:  149m ISOVUE-370 IOPAMIDOL (ISOVUE-370) INJECTION 76% COMPARISON:  Prior noncontrast CT from earlier the same day. FINDINGS: CTA NECK FINDINGS Aortic arch: Visualized aortic arch of normal caliber with normal branch pattern. No hemodynamically significant stenosis about the origin of the great vessels. Visualized subclavian arteries widely patent. Right carotid system: Right  common carotid artery tortuous proximally but widely patent to the bifurcation. Mild atheromatous plaque about the proximal right ICA without stenosis. Right ICA widely patent distally to the skull base without stenosis, dissection, or occlusion Left carotid system: Left common carotid artery tortuous proximally but widely patent to the bifurcation without stenosis. Mild plaque about the left bifurcation without hemodynamically significant stenosis. Left ICA patent distally to the skull base without stenosis, dissection, or occlusion. Vertebral arteries: Both of the  vertebral arteries arise from the subclavian arteries. Vertebral arteries widely patent within the neck without stenosis, dissection, or occlusion. Skeleton: Innumerable lytic lesions seen diffusely throughout the visualized spine, compatible with history of multiple myeloma. No acute osseous abnormality. Other neck: Soft tissues of the neck demonstrate no acute finding. Enlarged goiter is change of the left lobe of thyroid with substernal extension. Upper chest: Visualized upper chest within normal limits. Partially visualized lungs are clear. Review of the MIP images confirms the above findings CTA HEAD FINDINGS Anterior circulation: Internal carotid arteries patent to the termini without hemodynamically significant stenosis. A1 segments patent. Anterior communicating artery normal. Anterior cerebral arteries patent to their distal aspects. Left M1 patent proximally. There is prepped occlusion of the distal left M1/proximal M2 at the left MCA bifurcation (series 9, image 96). Attenuated and reduced flow seen distally within left MCA branches. Right M1 patent. Normal right MCA bifurcation. Distal right MCA branches well perfused. Posterior circulation: Vertebral arteries diminutive but patent to the vertebrobasilar junction without stenosis. Left PICA patent. Right PICA not well visualized. Basilar diminutive but patent to its distal aspect without stenosis. Superior cerebral arteries patent proximally. Fetal type origin of the left PCA. Hypoplastic right P1 with robust right posterior communicating artery. PCAs patent to their distal aspects. Venous sinuses: Grossly patent, although not well assessed due to timing of the contrast bolus. Anatomic variants: Predominant fetal type origin of the PCAs. 4 mm focal outpouching arising from the right MCA bifurcation, suspicious for aneurysm. Delayed phase: Not performed. Review of the MIP images confirms the above findings CT Brain Perfusion Findings: CBF (<30%) Volume:  10m Perfusion (Tmax>6.0s) volume: 952mMismatch Volume: 6977mnfarction Location:Anterior left MCA distribution, involving the left insular region and operculum. IMPRESSION: 1. Positive CTA with EVLO of distal left M1/proximal M2 branch at the base of the sylvian fissure. Acute left MCA territory infarct with surrounding penumbra as above. 2. Additional mild atheromatous change throughout the major arterial vasculature of the head and neck as above. No other hemodynamically significant or correctable identified. 3. 4 mm right MCA bifurcation aneurysm. 4. Innumerable lucent lesions throughout the visualized skeleton, compatible with multiple myeloma. Findings discussed with Dr. AroRory Percy approximately 5:45 p.m. on 06/30/2018. Electronically Signed   By: BenJeannine BogaD.   On: 06/30/2018 19:21   Dg Chest 2 View  Result Date: 07/01/2018 CLINICAL DATA:  Acute ischemic stroke. EXAM: CHEST - 2 VIEW COMPARISON:  12/10/2016 FINDINGS: Patient is rotated to the right. Both lungs are clear. No evidence of pneumothorax or pleural effusion. IMPRESSION: No active lung disease. Electronically Signed   By: JohEarle GellD.   On: 07/01/2018 19:38   Ct Angio Neck W Or Wo Contrast  Result Date: 06/30/2018 CLINICAL DATA:  Initial evaluation for acute stroke. EXAM: CT ANGIOGRAPHY HEAD AND NECK CT PERFUSION BRAIN TECHNIQUE: Multidetector CT imaging of the head and neck was performed using the standard protocol during bolus administration of intravenous contrast. Multiplanar CT image reconstructions and MIPs were obtained to evaluate the vascular  anatomy. Carotid stenosis measurements (when applicable) are obtained utilizing NASCET criteria, using the distal internal carotid diameter as the denominator. Multiphase CT imaging of the brain was performed following IV bolus contrast injection. Subsequent parametric perfusion maps were calculated using RAPID software. CONTRAST:  167m ISOVUE-370 IOPAMIDOL (ISOVUE-370) INJECTION  76% COMPARISON:  Prior noncontrast CT from earlier the same day. FINDINGS: CTA NECK FINDINGS Aortic arch: Visualized aortic arch of normal caliber with normal branch pattern. No hemodynamically significant stenosis about the origin of the great vessels. Visualized subclavian arteries widely patent. Right carotid system: Right common carotid artery tortuous proximally but widely patent to the bifurcation. Mild atheromatous plaque about the proximal right ICA without stenosis. Right ICA widely patent distally to the skull base without stenosis, dissection, or occlusion Left carotid system: Left common carotid artery tortuous proximally but widely patent to the bifurcation without stenosis. Mild plaque about the left bifurcation without hemodynamically significant stenosis. Left ICA patent distally to the skull base without stenosis, dissection, or occlusion. Vertebral arteries: Both of the vertebral arteries arise from the subclavian arteries. Vertebral arteries widely patent within the neck without stenosis, dissection, or occlusion. Skeleton: Innumerable lytic lesions seen diffusely throughout the visualized spine, compatible with history of multiple myeloma. No acute osseous abnormality. Other neck: Soft tissues of the neck demonstrate no acute finding. Enlarged goiter is change of the left lobe of thyroid with substernal extension. Upper chest: Visualized upper chest within normal limits. Partially visualized lungs are clear. Review of the MIP images confirms the above findings CTA HEAD FINDINGS Anterior circulation: Internal carotid arteries patent to the termini without hemodynamically significant stenosis. A1 segments patent. Anterior communicating artery normal. Anterior cerebral arteries patent to their distal aspects. Left M1 patent proximally. There is prepped occlusion of the distal left M1/proximal M2 at the left MCA bifurcation (series 9, image 96). Attenuated and reduced flow seen distally within left  MCA branches. Right M1 patent. Normal right MCA bifurcation. Distal right MCA branches well perfused. Posterior circulation: Vertebral arteries diminutive but patent to the vertebrobasilar junction without stenosis. Left PICA patent. Right PICA not well visualized. Basilar diminutive but patent to its distal aspect without stenosis. Superior cerebral arteries patent proximally. Fetal type origin of the left PCA. Hypoplastic right P1 with robust right posterior communicating artery. PCAs patent to their distal aspects. Venous sinuses: Grossly patent, although not well assessed due to timing of the contrast bolus. Anatomic variants: Predominant fetal type origin of the PCAs. 4 mm focal outpouching arising from the right MCA bifurcation, suspicious for aneurysm. Delayed phase: Not performed. Review of the MIP images confirms the above findings CT Brain Perfusion Findings: CBF (<30%) Volume: 266mPerfusion (Tmax>6.0s) volume: 9878mismatch Volume: 34m49mfarction Location:Anterior left MCA distribution, involving the left insular region and operculum. IMPRESSION: 1. Positive CTA with EVLO of distal left M1/proximal M2 branch at the base of the sylvian fissure. Acute left MCA territory infarct with surrounding penumbra as above. 2. Additional mild atheromatous change throughout the major arterial vasculature of the head and neck as above. No other hemodynamically significant or correctable identified. 3. 4 mm right MCA bifurcation aneurysm. 4. Innumerable lucent lesions throughout the visualized skeleton, compatible with multiple myeloma. Findings discussed with Dr. ArorRory Percyapproximately 5:45 p.m. on 06/30/2018. Electronically Signed   By: BenjJeannine Boga.   On: 06/30/2018 19:21   Mr Mra Jodene Namd Wo Contrast  Result Date: 07/01/2018 CLINICAL DATA:  Follow-up examination for acute stroke. EXAM: MRI HEAD WITHOUT CONTRAST MRA HEAD WITHOUT CONTRAST  TECHNIQUE: Multiplanar, multiecho pulse sequences of the brain and  surrounding structures were obtained without intravenous contrast. Angiographic images of the head were obtained using MRA technique without contrast. COMPARISON:  Prior CTA perfusion from 06/30/2018. FINDINGS: MRI HEAD FINDINGS Brain: Examination severely degraded by motion artifact. Large confluent area of restricted diffusion seen involving the majority of the left MCA territory, with involvement of the left frontal, parietal, and temporal lobes, consistent with acute left MCA territory infarct. Associated localized gyral swelling and edema without significant regional mass effect at this time. Evidence for petechial hemorrhage superior to the left insula noted (series 10, image 69). No frank hemorrhagic transformation. Additional susceptibility artifact within the left sylvian fissure favored to be related to persistent intravascular thrombus. Remote right basal ganglia lacunar infarct with associated chronic hemosiderin staining noted. Additional chronic hemosiderin staining noted at the right parietal lobe as well. Underlying age-related cerebral atrophy with mild chronic small vessel ischemic disease. No mass lesion or midline shift. No hydrocephalus. No extra-axial fluid collection. Pituitary gland normal. Vascular: Susceptibility artifact within the left sylvian fissure, most likely intraluminal arterial thrombus within left MCA branches. Major intracranial vascular flow voids otherwise maintained at the skull base. Skull and upper cervical spine: Craniocervical junction normal. Diffuse osseous changes related to multiple myeloma noted. No scalp soft tissue abnormality. Sinuses/Orbits: Globes and orbital soft tissues within normal limits. Paranasal sinuses are largely clear. No mastoid effusion. Inner ear structures grossly normal. Other: None. MRA HEAD FINDINGS ANTERIOR CIRCULATION: Examination severely degraded by motion artifact. Distal cervical segments of the internal carotid arteries are patent with  antegrade flow. Petrous, cavernous, and supraclinoid segments patent without hemodynamically significant stenosis. A1 segments, anterior communicating artery, and anterior cerebral arteries grossly patent without stenosis. Proximal and mid left M1 patent. There is persistent occlusion of distal left M1/proximal M2 at the base of the sylvian fissure (series 4, image 60). Persistent flow within the anterior temporal branch. No other significant flow within the left MCA territory. Right M1 widely patent without stenosis. No proximal right M2 occlusion. Distal right MCA branches grossly perfused. POSTERIOR CIRCULATION: Vertebral arteries grossly patent to the vertebrobasilar junction without stenosis. Posterior inferior cerebral arteries patent bilaterally. Basilar patent to its distal aspect without obvious stenosis. Superior cerebral arteries patent bilaterally. PCAs grossly patent to their distal aspects without obvious stenosis. IMPRESSION: MRI HEAD IMPRESSION: 1. Large evolving acute left MCA territory infarct involving the majority of the left MCA territory. Associated petechial hemorrhage anteriorly without frank hemorrhagic transformation. Localized gyral swelling and edema without significant mass effect at this time. 2. Chronic right basal ganglia lacunar infarct. 3. Underlying mild chronic small vessel ischemic disease. MRA HEAD IMPRESSION: 1. Persistent distal left M1/proximal M2 occlusion with evidence for intraluminal thrombus in the left sylvian fissure. 2. Otherwise grossly stable intracranial MRA with no other large vessel occlusion or hemodynamically significant stenosis. Electronically Signed   By: Jeannine Boga M.D.   On: 07/01/2018 18:00   Mr Brain Wo Contrast  Result Date: 07/01/2018 CLINICAL DATA:  Follow-up examination for acute stroke. EXAM: MRI HEAD WITHOUT CONTRAST MRA HEAD WITHOUT CONTRAST TECHNIQUE: Multiplanar, multiecho pulse sequences of the brain and surrounding structures  were obtained without intravenous contrast. Angiographic images of the head were obtained using MRA technique without contrast. COMPARISON:  Prior CTA perfusion from 06/30/2018. FINDINGS: MRI HEAD FINDINGS Brain: Examination severely degraded by motion artifact. Large confluent area of restricted diffusion seen involving the majority of the left MCA territory, with involvement of the left frontal, parietal,  and temporal lobes, consistent with acute left MCA territory infarct. Associated localized gyral swelling and edema without significant regional mass effect at this time. Evidence for petechial hemorrhage superior to the left insula noted (series 10, image 69). No frank hemorrhagic transformation. Additional susceptibility artifact within the left sylvian fissure favored to be related to persistent intravascular thrombus. Remote right basal ganglia lacunar infarct with associated chronic hemosiderin staining noted. Additional chronic hemosiderin staining noted at the right parietal lobe as well. Underlying age-related cerebral atrophy with mild chronic small vessel ischemic disease. No mass lesion or midline shift. No hydrocephalus. No extra-axial fluid collection. Pituitary gland normal. Vascular: Susceptibility artifact within the left sylvian fissure, most likely intraluminal arterial thrombus within left MCA branches. Major intracranial vascular flow voids otherwise maintained at the skull base. Skull and upper cervical spine: Craniocervical junction normal. Diffuse osseous changes related to multiple myeloma noted. No scalp soft tissue abnormality. Sinuses/Orbits: Globes and orbital soft tissues within normal limits. Paranasal sinuses are largely clear. No mastoid effusion. Inner ear structures grossly normal. Other: None. MRA HEAD FINDINGS ANTERIOR CIRCULATION: Examination severely degraded by motion artifact. Distal cervical segments of the internal carotid arteries are patent with antegrade flow. Petrous,  cavernous, and supraclinoid segments patent without hemodynamically significant stenosis. A1 segments, anterior communicating artery, and anterior cerebral arteries grossly patent without stenosis. Proximal and mid left M1 patent. There is persistent occlusion of distal left M1/proximal M2 at the base of the sylvian fissure (series 4, image 60). Persistent flow within the anterior temporal branch. No other significant flow within the left MCA territory. Right M1 widely patent without stenosis. No proximal right M2 occlusion. Distal right MCA branches grossly perfused. POSTERIOR CIRCULATION: Vertebral arteries grossly patent to the vertebrobasilar junction without stenosis. Posterior inferior cerebral arteries patent bilaterally. Basilar patent to its distal aspect without obvious stenosis. Superior cerebral arteries patent bilaterally. PCAs grossly patent to their distal aspects without obvious stenosis. IMPRESSION: MRI HEAD IMPRESSION: 1. Large evolving acute left MCA territory infarct involving the majority of the left MCA territory. Associated petechial hemorrhage anteriorly without frank hemorrhagic transformation. Localized gyral swelling and edema without significant mass effect at this time. 2. Chronic right basal ganglia lacunar infarct. 3. Underlying mild chronic small vessel ischemic disease. MRA HEAD IMPRESSION: 1. Persistent distal left M1/proximal M2 occlusion with evidence for intraluminal thrombus in the left sylvian fissure. 2. Otherwise grossly stable intracranial MRA with no other large vessel occlusion or hemodynamically significant stenosis. Electronically Signed   By: Jeannine Boga M.D.   On: 07/01/2018 18:00   Ct Cerebral Perfusion W Contrast  Result Date: 06/30/2018 CLINICAL DATA:  Initial evaluation for acute stroke. EXAM: CT ANGIOGRAPHY HEAD AND NECK CT PERFUSION BRAIN TECHNIQUE: Multidetector CT imaging of the head and neck was performed using the standard protocol during bolus  administration of intravenous contrast. Multiplanar CT image reconstructions and MIPs were obtained to evaluate the vascular anatomy. Carotid stenosis measurements (when applicable) are obtained utilizing NASCET criteria, using the distal internal carotid diameter as the denominator. Multiphase CT imaging of the brain was performed following IV bolus contrast injection. Subsequent parametric perfusion maps were calculated using RAPID software. CONTRAST:  133m ISOVUE-370 IOPAMIDOL (ISOVUE-370) INJECTION 76% COMPARISON:  Prior noncontrast CT from earlier the same day. FINDINGS: CTA NECK FINDINGS Aortic arch: Visualized aortic arch of normal caliber with normal branch pattern. No hemodynamically significant stenosis about the origin of the great vessels. Visualized subclavian arteries widely patent. Right carotid system: Right common carotid artery tortuous proximally but widely  patent to the bifurcation. Mild atheromatous plaque about the proximal right ICA without stenosis. Right ICA widely patent distally to the skull base without stenosis, dissection, or occlusion Left carotid system: Left common carotid artery tortuous proximally but widely patent to the bifurcation without stenosis. Mild plaque about the left bifurcation without hemodynamically significant stenosis. Left ICA patent distally to the skull base without stenosis, dissection, or occlusion. Vertebral arteries: Both of the vertebral arteries arise from the subclavian arteries. Vertebral arteries widely patent within the neck without stenosis, dissection, or occlusion. Skeleton: Innumerable lytic lesions seen diffusely throughout the visualized spine, compatible with history of multiple myeloma. No acute osseous abnormality. Other neck: Soft tissues of the neck demonstrate no acute finding. Enlarged goiter is change of the left lobe of thyroid with substernal extension. Upper chest: Visualized upper chest within normal limits. Partially visualized lungs  are clear. Review of the MIP images confirms the above findings CTA HEAD FINDINGS Anterior circulation: Internal carotid arteries patent to the termini without hemodynamically significant stenosis. A1 segments patent. Anterior communicating artery normal. Anterior cerebral arteries patent to their distal aspects. Left M1 patent proximally. There is prepped occlusion of the distal left M1/proximal M2 at the left MCA bifurcation (series 9, image 96). Attenuated and reduced flow seen distally within left MCA branches. Right M1 patent. Normal right MCA bifurcation. Distal right MCA branches well perfused. Posterior circulation: Vertebral arteries diminutive but patent to the vertebrobasilar junction without stenosis. Left PICA patent. Right PICA not well visualized. Basilar diminutive but patent to its distal aspect without stenosis. Superior cerebral arteries patent proximally. Fetal type origin of the left PCA. Hypoplastic right P1 with robust right posterior communicating artery. PCAs patent to their distal aspects. Venous sinuses: Grossly patent, although not well assessed due to timing of the contrast bolus. Anatomic variants: Predominant fetal type origin of the PCAs. 4 mm focal outpouching arising from the right MCA bifurcation, suspicious for aneurysm. Delayed phase: Not performed. Review of the MIP images confirms the above findings CT Brain Perfusion Findings: CBF (<30%) Volume: 71m Perfusion (Tmax>6.0s) volume: 94mMismatch Volume: 6966mnfarction Location:Anterior left MCA distribution, involving the left insular region and operculum. IMPRESSION: 1. Positive CTA with EVLO of distal left M1/proximal M2 branch at the base of the sylvian fissure. Acute left MCA territory infarct with surrounding penumbra as above. 2. Additional mild atheromatous change throughout the major arterial vasculature of the head and neck as above. No other hemodynamically significant or correctable identified. 3. 4 mm right MCA  bifurcation aneurysm. 4. Innumerable lucent lesions throughout the visualized skeleton, compatible with multiple myeloma. Findings discussed with Dr. AroRory Percy approximately 5:45 p.m. on 06/30/2018. Electronically Signed   By: BenJeannine BogaD.   On: 06/30/2018 19:21   Dg Chest Port 1 View  Result Date: 07/02/2018 CLINICAL DATA:  Shortness of breath, cough, history of asthma, hypertension, acute CVA. EXAM: PORTABLE CHEST 1 VIEW COMPARISON:  PA and lateral chest x-rays of July 01, 2018 and December 10, 2016. FINDINGS: The patient is rotated toward the right. The lungs are adequately inflated. The interstitial markings are coarse at both bases. The cardiac silhouette is enlarged. The pulmonary vascularity is normal. Old right lateral rib fractures are present and stable. There are degenerative changes of both shoulders. IMPRESSION: Persistent bibasilar atelectasis. Mild enlargement of the cardiac silhouette without pulmonary vascular congestion or pulmonary edema. Electronically Signed   By: David  JorMartiniqueD.   On: 07/02/2018 11:48   Dg Abd Portable 1v  Result Date:  07/02/2018 CLINICAL DATA:  Feeding tube placement EXAM: PORTABLE ABDOMEN - 1 VIEW COMPARISON:  None. FINDINGS: The tip of a feeding catheter is positioned in the second portion of the duodenum. Visualized abdomen demonstrates nonspecific bowel gas pattern. IMPRESSION: Feeding tube is transpyloric with the tip in the distal descending duodenum. Electronically Signed   By: Misty Stanley M.D.   On: 07/02/2018 13:31   Dg Swallowing Func-speech Pathology  Result Date: 07/04/2018 Objective Swallowing Evaluation: Type of Study: MBS-Modified Barium Swallow Study  Patient Details Name: Ruth Sanchez MRN: [id number] Date of Birth: 30-Nov-1945 Today's Date: 07/04/2018 Time: SLP Start Time (ACUTE ONLY): 1335 -SLP Stop Time (ACUTE ONLY): 1400 SLP Time Calculation (min) (ACUTE ONLY): 25 min Past Medical History: Past Medical History: Diagnosis  Date . Arthritis  . Asthma  . Cancer ([place])  . Hypertension  Past Surgical History: Past Surgical History: Procedure Laterality Date . APPENDECTOMY   . CHOLECYSTECTOMY   . ESOPHAGOGASTRODUODENOSCOPY N/A 11/24/2015  Procedure: ESOPHAGOGASTRODUODENOSCOPY (EGD);  Surgeon: Clarene Essex, MD;  Location: [facility];  Service: Endoscopy;  Laterality: N/A; . RADIOLOGY WITH ANESTHESIA N/A 06/30/2018  Procedure: IR WITH ANESTHESIA;  Surgeon: Luanne Bras, MD;  Location: [place];  Service: Radiology;  Laterality: N/A; . TUBAL LIGATION   HPI: 72 year old female admitted 06/30/18 with stroke symptoms. PMH: arthritis, asthma, HTN. MRI pending.  Subjective: Pt resting in bed. Family present. Assessment / Plan / Recommendation CHL IP CLINICAL IMPRESSIONS 07/04/2018 Clinical Impression Pt demonstrates slow oral manipulation with max hand over hand assist needed to elicit automatic responses in MBS setting (more so than at bedside). Oral holding observed with puree and mechanical soft solids. Liquids that spill to the vestibule more readily trigger full oral transit and swallow reponse, but nectar is aspirated without sensation prior to the swallow. Honey is tolerated quite well and there are no pharyngeal residuals. Recommend pt initaite a puree diet with honey thick liquids. Expect improved tolerance with more natural meal setting. If pt consumes adequate quantities over the next 24-48 hourse could consider NG tube removal. SLP Visit Diagnosis Dysphagia, oropharyngeal phase (R13.12) Attention and concentration deficit following -- Frontal lobe and executive function deficit following -- Impact on safety and function Mild aspiration risk   CHL IP TREATMENT RECOMMENDATION 07/04/2018 Treatment Recommendations Therapy as outlined in treatment plan below   Prognosis 07/01/2018 Prognosis for Safe Diet Advancement Fair Barriers to Reach Goals Language deficits;Severity of deficits Barriers/Prognosis Comment -- CHL IP DIET RECOMMENDATION  07/04/2018 SLP Diet Recommendations Dysphagia 1 (Puree) solids;Honey thick liquids Liquid Administration via Cup Medication Administration Crushed with puree Compensations Slow rate;Small sips/bites Postural Changes Seated upright at 90 degrees   CHL IP OTHER RECOMMENDATIONS 07/04/2018 Recommended Consults -- Oral Care Recommendations -- Other Recommendations Have oral suction available   CHL IP FOLLOW UP RECOMMENDATIONS 07/04/2018 Follow up Recommendations Skilled Nursing facility   [facility] IP FREQUENCY AND DURATION 07/04/2018 Speech Therapy Frequency (ACUTE ONLY) min 2x/week Treatment Duration 2 weeks      CHL IP ORAL PHASE 07/04/2018 Oral Phase Impaired Oral - Pudding Teaspoon -- Oral - Pudding Cup -- Oral - Honey Teaspoon -- Oral - Honey Cup Delayed oral transit;Decreased bolus cohesion;Right anterior bolus loss Oral - Nectar Teaspoon Delayed oral transit;Decreased bolus cohesion;Right anterior bolus loss Oral - Nectar Cup Delayed oral transit;Decreased bolus cohesion;Right anterior bolus loss Oral - Nectar Straw -- Oral - Thin Teaspoon -- Oral - Thin Cup -- Oral - Thin Straw -- Oral - Puree Delayed oral transit;Decreased bolus cohesion;Right  anterior bolus loss Oral - Mech Soft Delayed oral transit;Decreased bolus cohesion;Right anterior bolus loss Oral - Regular -- Oral - Multi-Consistency -- Oral - Pill -- Oral Phase - Comment --  CHL IP PHARYNGEAL PHASE 07/04/2018 Pharyngeal Phase Impaired Pharyngeal- Pudding Teaspoon -- Pharyngeal -- Pharyngeal- Pudding Cup -- Pharyngeal -- Pharyngeal- Honey Teaspoon Delayed swallow initiation-pyriform sinuses Pharyngeal -- Pharyngeal- Honey Cup Delayed swallow initiation-pyriform sinuses Pharyngeal -- Pharyngeal- Nectar Teaspoon -- Pharyngeal -- Pharyngeal- Nectar Cup Delayed swallow initiation-pyriform sinuses;Penetration/Aspiration before swallow;Penetration/Aspiration during swallow;Trace aspiration Pharyngeal Material enters airway, passes BELOW cords without attempt by patient  to eject out (silent aspiration) Pharyngeal- Nectar Straw -- Pharyngeal -- Pharyngeal- Thin Teaspoon -- Pharyngeal -- Pharyngeal- Thin Cup -- Pharyngeal -- Pharyngeal- Thin Straw -- Pharyngeal -- Pharyngeal- Puree Delayed swallow initiation-vallecula Pharyngeal -- Pharyngeal- Mechanical Soft NT Pharyngeal -- Pharyngeal- Regular -- Pharyngeal -- Pharyngeal- Multi-consistency -- Pharyngeal -- Pharyngeal- Pill -- Pharyngeal -- Pharyngeal Comment --  No flowsheet data found. DeBlois, Katherene Ponto 07/04/2018, 2:34 PM              Ct Head Code Stroke Wo Contrast  Result Date: 06/30/2018 CLINICAL DATA:  Code stroke. Initial evaluation for acute right-sided gaze. EXAM: CT HEAD WITHOUT CONTRAST TECHNIQUE: Contiguous axial images were obtained from the base of the skull through the vertex without intravenous contrast. COMPARISON:  Prior CT from 11/24/2015. FINDINGS: Brain: Age-related cerebral atrophy with chronic small vessel ischemic disease. Remote lacunar infarct present within the right basal ganglia. Subtle hypodensity seen involving the left insular ribbon, consistent with evolving acute ischemic left MCA territory infarct. No acute intracranial hemorrhage. No mass lesion, midline shift or mass effect. No hydrocephalus. No extra-axial fluid collection. Vascular: No definite hyperdense vessel identified. Scattered vascular calcifications noted within the carotid siphons. Skull: Scalp soft tissues within normal limits. Multiple lucent lesion seen throughout the calvarium, consistent with multiple myeloma. Sinuses/Orbits: Left-sided gaze noted. Paranasal sinuses and mastoid air cells are clear. Other: None. ASPECTS [facility] Stroke Program Early CT Score) - Ganglionic level infarction (caudate, lentiform nuclei, internal capsule, insula, M1-M3 cortex): 6 - Supraganglionic infarction (M4-M6 cortex): 3 Total score (0-10 with 10 being normal): 9 IMPRESSION: 1. Abnormal hypodensity involving the left insular cortex,  suspicious for acute left MCA territory infarct. 2. ASPECTS is 9. These results were communicated to Dr. Rory Percy at 6:25 pmon 9/9/2019by text page via the [facility] messaging system. Electronically Signed   By: Jeannine Boga M.D.   On: 06/30/2018 18:25    TTE - Left ventricle: The cavity size was normal. Wall thickness was normal. Systolic function was normal. The estimated ejection fraction was in the range of 55% to 60%. Wall motion was normal; there were no regional wall motion abnormalities. - Aortic valve: There was trivial regurgitation. - Mitral valve: There was mild regurgitation. - Left atrium: The atrium was severely dilated. - Right atrium: The atrium was mildly to moderately dilated. - Tricuspid valve: There was moderate regurgitation. - Pulmonary arteries: Systolic pressure was mildly increased. PA peak pressure: 33 mm Hg (S).  PHYSICAL EXAM General - Well nourished, well developed, not in acute distress. Cardiovascular - irregularly irregular heart rate and rhythm.  Neuro - eyes open, still global aphasia, not following commands, no language output. Not repeating or naming. Left forced gaze, right neglect, not blinking to visual threat on the right. PERRL, not tracking on the right. RUE 0/5 and RLE mild withdraw on pain stimulation. LUE and LLE spontaneous movement and against gravity. Sensation, coordination and gait not  tested.  No change in neuro exam since yesterdsay   ASSESSMENT/PLAN Ruth Sanchez is a 72 y.o. female with history of HTN, MM in remission admitted for left gaze, aphasia and right weakness. No tPA given due to OSW.    Stroke:  left MCA large infarct due to left M1 occlusion, embolic secondary to new diagnosis of afib  Resultant full blown of left MCA syndrome  CT old right CR infarct  CTA head and neck left M1 M2 bifurcation occlusion  DSA - left M2 occlusion with initial TICI2b reperfusion but then re-occluded. Unsuccessful intervention  MRI  confirmed left large MCA infarct  MRA persistent left M1 occlusion  2D Echo EF 55-60%  LDL 62  HgbA1c 5.3  SCDs for VTE prophylaxis  No antithrombotic prior to admission, now on plavix after po access. ASA allergy noted. Will consider DOACs in 14 days if neuro stable.   Ongoing aggressive stroke risk factor management  Therapy recommendations:  SNF  Disposition:  Pending. [facility] for d/c Monday depending on po tolerance  Afib - new diagnosis  Tele and EKG confirmed afib rhythm  Intermittent RVR, resumed coreg 12.52m bid  Likely the cause of current stroke  May consider DOACs in 14 days if neuro stable  Dysphagia  NPO - Did not pass swallow  cortrak in place on TF  MBS this afternoon - diet increased to D1 honey thick  Monitor intake  Hold PEG for now  Leukocytosis  WBC 8.4->19.8->15.5->16.0  UA neg  CXR 07/01/18 no active disease  CXR 07/02/18 no active disease  Afebrile  Close CBC monitoring  Hypertension Stable off cleviprex BP goal < 160 now On home meds with norvasc and coreg  Long term BP goal 130-150 given left M1 persistent occlusion  Other Stroke Risk Factors  Advanced age  Other Active Problems  MM in remission  Hospital day # [place] MSN, APRN, ANVP-BC, AGPCNP-BC Advanced Practice Stroke Nurse CGarrettfor Schedule & Pager information 07/04/2018 4:28 PM   ATTENDING NOTE: I reviewed above note and agree with the assessment and plan. Pt was seen and examined.   Pt lying in bed. Still has global aphasia, left gaze and right hemiplegia. However, passed swallow. Put on dys 1 diet and honey thick liquids. She took a couple of bit this evening. Still has TF @ 45cc and IVF @ 25. Will decrease TF and IVF as pt eating improves. Hold off PEG tube planning. BP stable and rate controlled.   JRosalin Hawking MD PhD Stroke Neurology 07/04/2018 10:23 PM    To contact Stroke Continuity provider, please refer  to [email or web address] After hours, contact General Neurology

## 2018-07-04 NOTE — Progress Notes (Signed)
  Speech Language Pathology Treatment: Dysphagia;Cognitive-Linquistic  Patient Details Name: Ruth Sanchez MRN: [id number] DOB: January 10, 1946 Today's Date: 07/04/2018 Time: 0915-1000 SLP Time Calculation (min) (ACUTE ONLY): 45 min  Assessment / Plan / Recommendation Clinical Impression  Pt again able to grasp cup, self feed with primarily tactile cues, just a gentle touch to initiate lifting ot cup or spoon to lips. No immediate signs of aspiration with nectar or pudding, but after sitting pt edge of bed, some wet upper airway sounds were noted concerning for possible pharyngeal residuals. Will proceed with MBS today, expect diet initiation today.  Attempted MIT techniques while pt in bed, but she was poorly attentive and getting sleepy, so transitioned to EOB sitting with therapist providing support on right to rock pt with automatic language tasks, humming and singing. Still, though maximally alert, pt did not attempt articulation or phonation.   HPI HPI: 72 year old female admitted 06/30/18 with stroke symptoms. PMH: arthritis, asthma, HTN. MRI pending.      SLP Plan  MBS       Recommendations  Diet recommendations: Dysphagia 2 (fine chop);Thin liquid Liquids provided via: Cup Medication Administration: Whole meds with liquid Supervision: Staff to assist with self feeding;Full supervision/cueing for compensatory strategies                General recommendations: Rehab consult Oral Care Recommendations: Oral care BID Follow up Recommendations: Skilled Nursing facility Plan: Ruth Sanchez, [place] CCC-SLP [phone]  Ruth Sanchez 07/04/2018, 10:29 AM

## 2018-07-04 NOTE — Care Management Important Message (Signed)
Important Message  Patient Details  Name: Ruth Sanchez MRN: [id number] Date of Birth: [birth year]   Medicare Important Message Given:  Yes    Orbie Pyo 07/04/2018, 3:42 PM

## 2018-07-04 NOTE — Progress Notes (Signed)
Physical Therapy Treatment Patient Details Name: Ruth Sanchez MRN: [id number] DOB: 1946/05/18 Today's Date: 07/04/2018    History of Present Illness Pt is a 72 y.o. female with PMH significant for HTN, arthritis, asthma, and cancer who presented with L gaze, aphasia, and R sided weakness. Pt is s/p proximal inferior MCA revascularization in IR 06/30/18.     PT Comments    Session limited by fatigue. Pt not following simple commands. She will open her eyes when called by name 50% of time. Pt positioned in recliner with feet elevated at end of session.   Follow Up Recommendations  SNF     Equipment Recommendations  Other (comment)(TBD)    Recommendations for Other Services       Precautions / Restrictions Precautions Precautions: Fall;Other (comment) Precaution Comments: R sided weakness; L gaze    Mobility  Bed Mobility Overal bed mobility: Needs Assistance   Rolling: Max assist;+2 for physical assistance Sidelying to sit: Max assist;+2 for physical assistance          Transfers Overall transfer level: Needs assistance Equipment used: None Transfers: Squat Pivot Transfers     Squat pivot transfers: Max assist;+2 physical assistance     General transfer comment: pivot transfer bed to recliner toward left  Ambulation/Gait             General Gait Details: not able   Stairs             Wheelchair Mobility    Modified Rankin (Stroke Patients Only) Modified Rankin (Stroke Patients Only) Pre-Morbid Rankin Score: No symptoms Modified Rankin: Severe disability     Balance Overall balance assessment: Needs assistance   Sitting balance-Leahy Scale: Poor       Standing balance-Leahy Scale: Zero                              Cognition Arousal/Alertness: Lethargic Behavior During Therapy: Flat affect Overall Cognitive Status: Difficult to assess Area of Impairment: Attention;Following commands                   Current  Attention Level: Focused   Following Commands: Follows one step commands inconsistently              Exercises General Exercises - Upper Extremity Shoulder Flexion: PROM;5 reps;Right;Seated Shoulder ABduction: PROM;Right;5 reps;Seated Elbow Flexion: PROM;5 reps;Right;Seated General Exercises - Lower Extremity Ankle Circles/Pumps: PROM;Right;5 reps;Seated Long Arc Quad: PROM;Right;5 reps;Seated    General Comments        Pertinent Vitals/Pain Pain Assessment: Faces Faces Pain Scale: Hurts even more Pain Location: back and all over Pain Descriptors / Indicators: Sore;Grimacing Pain Intervention(s): Monitored during session    Home Living                      Prior Function            PT Goals (current goals can now be found in the care plan section) Acute Rehab PT Goals Patient Stated Goal: unable to state PT Goal Formulation: With patient/family Time For Goal Achievement: 07/15/18 Potential to Achieve Goals: Fair Progress towards PT goals: Progressing toward goals    Frequency    Min 3X/week      PT Plan Current plan remains appropriate    Co-evaluation              AM-PAC PT "6 Clicks" Daily Activity  Outcome Measure  Difficulty turning over in  bed (including adjusting bedclothes, sheets and blankets)?: Unable Difficulty moving from lying on back to sitting on the side of the bed? : Unable Difficulty sitting down on and standing up from a chair with arms (e.g., wheelchair, bedside commode, etc,.)?: Unable Help needed moving to and from a bed to chair (including a wheelchair)?: Total Help needed walking in hospital room?: Total Help needed climbing 3-5 steps with a railing? : Total 6 Click Score: 6    End of Session Equipment Utilized During Treatment: Gait belt Activity Tolerance: Patient limited by fatigue Patient left: in chair;with family/visitor present;with call bell/phone within reach Nurse Communication: Mobility status;Need  for lift equipment PT Visit Diagnosis: Muscle weakness (generalized) (M62.81);Hemiplegia and hemiparesis Hemiplegia - Right/Left: Right Hemiplegia - dominant/non-dominant: Dominant Hemiplegia - caused by: Cerebral infarction     Time: 1217-1235 PT Time Calculation (min) (ACUTE ONLY): 18 min  Charges:  $Therapeutic Exercise: 8-22 mins                     Lorrin Goodell, PT  Office # [phone] Pager [phone]    Lorriane Shire 07/04/2018, 1:16 PM

## 2018-07-04 NOTE — Progress Notes (Signed)
CSW met with patient's son, Demond, and daughter-in-law, Odette Horns in the room. Patient out for a procedure. CSW provided list of SNF bed offers. Son prefers [facility] in [place], but they have not yet offered a bed for patient. CSW left voicemail for [facility], awaiting call back. CSW advised family to choose a back up option in case [facility] is unable to offer a bed.  Family also had questions about applying for Medicaid for patient. Family believes patient will require long term care and plan to apply for long term care Medicaid. CSW provided information on what kind of documents family needs to bring to DSS for the application.  CSW to follow for disposition planning.   Estanislado Emms, [place]

## 2018-07-04 NOTE — Progress Notes (Signed)
RN attempted to feed patient, she only drink half of apple juice (honey thick) of  72ml and ate 2 bites of mash potatoes (this is her favorite food per family) and 2 spoonful of ice cream. Continue with TF per order.   Ave Filter, RN

## 2018-07-05 LAB — GLUCOSE, CAPILLARY
GLUCOSE-CAPILLARY: 113 mg/dL — AB (ref 70–99)
GLUCOSE-CAPILLARY: 145 mg/dL — AB (ref 70–99)
Glucose-Capillary: 124 mg/dL — ABNORMAL HIGH (ref 70–99)
Glucose-Capillary: 124 mg/dL — ABNORMAL HIGH (ref 70–99)
Glucose-Capillary: 126 mg/dL — ABNORMAL HIGH (ref 70–99)
Glucose-Capillary: 128 mg/dL — ABNORMAL HIGH (ref 70–99)
Glucose-Capillary: 131 mg/dL — ABNORMAL HIGH (ref 70–99)

## 2018-07-05 LAB — BASIC METABOLIC PANEL
ANION GAP: 9 (ref 5–15)
BUN: 12 mg/dL (ref 8–23)
CALCIUM: 8.6 mg/dL — AB (ref 8.9–10.3)
CO2: 21 mmol/L — ABNORMAL LOW (ref 22–32)
CREATININE: 0.64 mg/dL (ref 0.44–1.00)
Chloride: 111 mmol/L (ref 98–111)
Glucose, Bld: 133 mg/dL — ABNORMAL HIGH (ref 70–99)
Potassium: 3.1 mmol/L — ABNORMAL LOW (ref 3.5–5.1)
SODIUM: 141 mmol/L (ref 135–145)

## 2018-07-05 LAB — CBC
HEMATOCRIT: 36.6 % (ref 36.0–46.0)
Hemoglobin: 11.7 g/dL — ABNORMAL LOW (ref 12.0–15.0)
MCH: 29.1 pg (ref 26.0–34.0)
MCHC: 32 g/dL (ref 30.0–36.0)
MCV: 91 fL (ref 78.0–100.0)
Platelets: 170 10*3/uL (ref 150–400)
RBC: 4.02 MIL/uL (ref 3.87–5.11)
RDW: 13.2 % (ref 11.5–15.5)
WBC: 14.3 10*3/uL — ABNORMAL HIGH (ref 4.0–10.5)

## 2018-07-05 NOTE — Progress Notes (Signed)
STROKE TEAM PROGRESS NOTE   SUBJECTIVE (INTERVAL HISTORY) Pt NT is at the bedside for bath.  Patient ate small portion of meal, not more than 50%. Will continue TF and IVF. Neuro stable without significant change.   OBJECTIVE Temp:  [97.7 F (36.5 C)-98.7 F (37.1 C)] 98 F (36.7 C) (09/14 0403) Pulse Rate:  [84-113] 84 (09/14 0403) Cardiac Rhythm: Atrial fibrillation (09/14 0700) Resp:  [15-18] 18 (09/14 0403) BP: (114-163)/(75-96) 114/75 (09/14 0403) SpO2:  [96 %-100 %] 98 % (09/14 0403) Weight:  [67.1 kg] 67.1 kg (09/14 0403)  Recent Labs  Lab 07/04/18 1545 07/04/18 1956 07/05/18 0020 07/05/18 0404 07/05/18 0737  GLUCAP 101* 117* 113* 128* 131*   Recent Labs  Lab 06/30/18 1812 07/01/18 0503 07/02/18 0347 07/03/18 0222 07/04/18 0541  NA 142 140 142 139 141  K 3.0* 2.8* 3.3* 3.2* 3.5  CL 108 108 111 108 113*  CO2 25 19* 19* 21* 20*  GLUCOSE 108* 169* 99 113* 129*  BUN 12 6* '8 9 10  ' CREATININE 0.78 0.68 0.72 0.69 0.65  CALCIUM 9.6 8.4* 8.1* 7.9* 8.3*   Recent Labs  Lab 06/30/18 1812  AST 23  ALT 13  ALKPHOS 107  BILITOT 1.3*  PROT 7.7  ALBUMIN 3.8   Recent Labs  Lab 06/30/18 1808 06/30/18 1812 07/02/18 0347 07/03/18 0222 07/04/18 0541  WBC  --  8.4 19.8* 15.5* 16.0*  NEUTROABS  --  6.2  --   --   --   HGB 12.9 12.5 10.9* 10.8* 11.7*  HCT 38.0 40.3 33.8* 33.8* 36.0  MCV  --  93.5 91.8 91.4 90.7  PLT  --  125* 120* 120* 134*   No results for input(s): CKTOTAL, CKMB, CKMBINDEX, TROPONINI in the last 168 hours. No results for input(s): LABPROT, INR in the last 72 hours. Recent Labs    07/02/18 1323  COLORURINE STRAW*  LABSPEC 1.008  PHURINE 8.0  GLUCOSEU NEGATIVE  HGBUR SMALL*  BILIRUBINUR NEGATIVE  KETONESUR NEGATIVE  PROTEINUR NEGATIVE  NITRITE NEGATIVE  LEUKOCYTESUR NEGATIVE       Component Value Date/Time   CHOL 134 07/01/2018 0503   TRIG 74 07/01/2018 0503   HDL 57 07/01/2018 0503   CHOLHDL 2.4 07/01/2018 0503   VLDL 15  07/01/2018 0503   LDLCALC 62 07/01/2018 0503   Lab Results  Component Value Date   HGBA1C 5.3 07/01/2018      Component Value Date/Time   COCAINSCRNUR Negative 01/14/2017 1215    No results for input(s): ETH in the last 168 hours.  Ct Angio Head W Or Wo Contrast  Result Date: 06/30/2018 CLINICAL DATA:  Initial evaluation for acute stroke. EXAM: CT ANGIOGRAPHY HEAD AND NECK CT PERFUSION BRAIN TECHNIQUE: Multidetector CT imaging of the head and neck was performed using the standard protocol during bolus administration of intravenous contrast. Multiplanar CT image reconstructions and MIPs were obtained to evaluate the vascular anatomy. Carotid stenosis measurements (when applicable) are obtained utilizing NASCET criteria, using the distal internal carotid diameter as the denominator. Multiphase CT imaging of the brain was performed following IV bolus contrast injection. Subsequent parametric perfusion maps were calculated using RAPID software. CONTRAST:  168m ISOVUE-370 IOPAMIDOL (ISOVUE-370) INJECTION 76% COMPARISON:  Prior noncontrast CT from earlier the same day. FINDINGS: CTA NECK FINDINGS Aortic arch: Visualized aortic arch of normal caliber with normal branch pattern. No hemodynamically significant stenosis about the origin of the great vessels. Visualized subclavian arteries widely patent. Right carotid system: Right common carotid artery tortuous  proximally but widely patent to the bifurcation. Mild atheromatous plaque about the proximal right ICA without stenosis. Right ICA widely patent distally to the skull base without stenosis, dissection, or occlusion Left carotid system: Left common carotid artery tortuous proximally but widely patent to the bifurcation without stenosis. Mild plaque about the left bifurcation without hemodynamically significant stenosis. Left ICA patent distally to the skull base without stenosis, dissection, or occlusion. Vertebral arteries: Both of the vertebral arteries  arise from the subclavian arteries. Vertebral arteries widely patent within the neck without stenosis, dissection, or occlusion. Skeleton: Innumerable lytic lesions seen diffusely throughout the visualized spine, compatible with history of multiple myeloma. No acute osseous abnormality. Other neck: Soft tissues of the neck demonstrate no acute finding. Enlarged goiter is change of the left lobe of thyroid with substernal extension. Upper chest: Visualized upper chest within normal limits. Partially visualized lungs are clear. Review of the MIP images confirms the above findings CTA HEAD FINDINGS Anterior circulation: Internal carotid arteries patent to the termini without hemodynamically significant stenosis. A1 segments patent. Anterior communicating artery normal. Anterior cerebral arteries patent to their distal aspects. Left M1 patent proximally. There is prepped occlusion of the distal left M1/proximal M2 at the left MCA bifurcation (series 9, image 96). Attenuated and reduced flow seen distally within left MCA branches. Right M1 patent. Normal right MCA bifurcation. Distal right MCA branches well perfused. Posterior circulation: Vertebral arteries diminutive but patent to the vertebrobasilar junction without stenosis. Left PICA patent. Right PICA not well visualized. Basilar diminutive but patent to its distal aspect without stenosis. Superior cerebral arteries patent proximally. Fetal type origin of the left PCA. Hypoplastic right P1 with robust right posterior communicating artery. PCAs patent to their distal aspects. Venous sinuses: Grossly patent, although not well assessed due to timing of the contrast bolus. Anatomic variants: Predominant fetal type origin of the PCAs. 4 mm focal outpouching arising from the right MCA bifurcation, suspicious for aneurysm. Delayed phase: Not performed. Review of the MIP images confirms the above findings CT Brain Perfusion Findings: CBF (<30%) Volume: 22m Perfusion  (Tmax>6.0s) volume: 954mMismatch Volume: 6941mnfarction Location:Anterior left MCA distribution, involving the left insular region and operculum. IMPRESSION: 1. Positive CTA with EVLO of distal left M1/proximal M2 branch at the base of the sylvian fissure. Acute left MCA territory infarct with surrounding penumbra as above. 2. Additional mild atheromatous change throughout the major arterial vasculature of the head and neck as above. No other hemodynamically significant or correctable identified. 3. 4 mm right MCA bifurcation aneurysm. 4. Innumerable lucent lesions throughout the visualized skeleton, compatible with multiple myeloma. Findings discussed with Dr. AroRory Percy approximately 5:45 p.m. on 06/30/2018. Electronically Signed   By: BenJeannine BogaD.   On: 06/30/2018 19:21   Dg Chest 2 View  Result Date: 07/01/2018 CLINICAL DATA:  Acute ischemic stroke. EXAM: CHEST - 2 VIEW COMPARISON:  12/10/2016 FINDINGS: Patient is rotated to the right. Both lungs are clear. No evidence of pneumothorax or pleural effusion. IMPRESSION: No active lung disease. Electronically Signed   By: JohEarle GellD.   On: 07/01/2018 19:38   Ct Angio Neck W Or Wo Contrast  Result Date: 06/30/2018 CLINICAL DATA:  Initial evaluation for acute stroke. EXAM: CT ANGIOGRAPHY HEAD AND NECK CT PERFUSION BRAIN TECHNIQUE: Multidetector CT imaging of the head and neck was performed using the standard protocol during bolus administration of intravenous contrast. Multiplanar CT image reconstructions and MIPs were obtained to evaluate the vascular anatomy. Carotid stenosis measurements (  when applicable) are obtained utilizing NASCET criteria, using the distal internal carotid diameter as the denominator. Multiphase CT imaging of the brain was performed following IV bolus contrast injection. Subsequent parametric perfusion maps were calculated using RAPID software. CONTRAST:  163m ISOVUE-370 IOPAMIDOL (ISOVUE-370) INJECTION 76%  COMPARISON:  Prior noncontrast CT from earlier the same day. FINDINGS: CTA NECK FINDINGS Aortic arch: Visualized aortic arch of normal caliber with normal branch pattern. No hemodynamically significant stenosis about the origin of the great vessels. Visualized subclavian arteries widely patent. Right carotid system: Right common carotid artery tortuous proximally but widely patent to the bifurcation. Mild atheromatous plaque about the proximal right ICA without stenosis. Right ICA widely patent distally to the skull base without stenosis, dissection, or occlusion Left carotid system: Left common carotid artery tortuous proximally but widely patent to the bifurcation without stenosis. Mild plaque about the left bifurcation without hemodynamically significant stenosis. Left ICA patent distally to the skull base without stenosis, dissection, or occlusion. Vertebral arteries: Both of the vertebral arteries arise from the subclavian arteries. Vertebral arteries widely patent within the neck without stenosis, dissection, or occlusion. Skeleton: Innumerable lytic lesions seen diffusely throughout the visualized spine, compatible with history of multiple myeloma. No acute osseous abnormality. Other neck: Soft tissues of the neck demonstrate no acute finding. Enlarged goiter is change of the left lobe of thyroid with substernal extension. Upper chest: Visualized upper chest within normal limits. Partially visualized lungs are clear. Review of the MIP images confirms the above findings CTA HEAD FINDINGS Anterior circulation: Internal carotid arteries patent to the termini without hemodynamically significant stenosis. A1 segments patent. Anterior communicating artery normal. Anterior cerebral arteries patent to their distal aspects. Left M1 patent proximally. There is prepped occlusion of the distal left M1/proximal M2 at the left MCA bifurcation (series 9, image 96). Attenuated and reduced flow seen distally within left MCA  branches. Right M1 patent. Normal right MCA bifurcation. Distal right MCA branches well perfused. Posterior circulation: Vertebral arteries diminutive but patent to the vertebrobasilar junction without stenosis. Left PICA patent. Right PICA not well visualized. Basilar diminutive but patent to its distal aspect without stenosis. Superior cerebral arteries patent proximally. Fetal type origin of the left PCA. Hypoplastic right P1 with robust right posterior communicating artery. PCAs patent to their distal aspects. Venous sinuses: Grossly patent, although not well assessed due to timing of the contrast bolus. Anatomic variants: Predominant fetal type origin of the PCAs. 4 mm focal outpouching arising from the right MCA bifurcation, suspicious for aneurysm. Delayed phase: Not performed. Review of the MIP images confirms the above findings CT Brain Perfusion Findings: CBF (<30%) Volume: 257mPerfusion (Tmax>6.0s) volume: 9822mismatch Volume: 76m36mfarction Location:Anterior left MCA distribution, involving the left insular region and operculum. IMPRESSION: 1. Positive CTA with EVLO of distal left M1/proximal M2 branch at the base of the sylvian fissure. Acute left MCA territory infarct with surrounding penumbra as above. 2. Additional mild atheromatous change throughout the major arterial vasculature of the head and neck as above. No other hemodynamically significant or correctable identified. 3. 4 mm right MCA bifurcation aneurysm. 4. Innumerable lucent lesions throughout the visualized skeleton, compatible with multiple myeloma. Findings discussed with Dr. ArorRory Percyapproximately 5:45 p.m. on 06/30/2018. Electronically Signed   By: BenjJeannine Boga.   On: 06/30/2018 19:21   Mr Mra Jodene Namd Wo Contrast  Result Date: 07/01/2018 CLINICAL DATA:  Follow-up examination for acute stroke. EXAM: MRI HEAD WITHOUT CONTRAST MRA HEAD WITHOUT CONTRAST TECHNIQUE: Multiplanar, multiecho pulse  sequences of the brain and  surrounding structures were obtained without intravenous contrast. Angiographic images of the head were obtained using MRA technique without contrast. COMPARISON:  Prior CTA perfusion from 06/30/2018. FINDINGS: MRI HEAD FINDINGS Brain: Examination severely degraded by motion artifact. Large confluent area of restricted diffusion seen involving the majority of the left MCA territory, with involvement of the left frontal, parietal, and temporal lobes, consistent with acute left MCA territory infarct. Associated localized gyral swelling and edema without significant regional mass effect at this time. Evidence for petechial hemorrhage superior to the left insula noted (series 10, image 69). No frank hemorrhagic transformation. Additional susceptibility artifact within the left sylvian fissure favored to be related to persistent intravascular thrombus. Remote right basal ganglia lacunar infarct with associated chronic hemosiderin staining noted. Additional chronic hemosiderin staining noted at the right parietal lobe as well. Underlying age-related cerebral atrophy with mild chronic small vessel ischemic disease. No mass lesion or midline shift. No hydrocephalus. No extra-axial fluid collection. Pituitary gland normal. Vascular: Susceptibility artifact within the left sylvian fissure, most likely intraluminal arterial thrombus within left MCA branches. Major intracranial vascular flow voids otherwise maintained at the skull base. Skull and upper cervical spine: Craniocervical junction normal. Diffuse osseous changes related to multiple myeloma noted. No scalp soft tissue abnormality. Sinuses/Orbits: Globes and orbital soft tissues within normal limits. Paranasal sinuses are largely clear. No mastoid effusion. Inner ear structures grossly normal. Other: None. MRA HEAD FINDINGS ANTERIOR CIRCULATION: Examination severely degraded by motion artifact. Distal cervical segments of the internal carotid arteries are patent with  antegrade flow. Petrous, cavernous, and supraclinoid segments patent without hemodynamically significant stenosis. A1 segments, anterior communicating artery, and anterior cerebral arteries grossly patent without stenosis. Proximal and mid left M1 patent. There is persistent occlusion of distal left M1/proximal M2 at the base of the sylvian fissure (series 4, image 60). Persistent flow within the anterior temporal branch. No other significant flow within the left MCA territory. Right M1 widely patent without stenosis. No proximal right M2 occlusion. Distal right MCA branches grossly perfused. POSTERIOR CIRCULATION: Vertebral arteries grossly patent to the vertebrobasilar junction without stenosis. Posterior inferior cerebral arteries patent bilaterally. Basilar patent to its distal aspect without obvious stenosis. Superior cerebral arteries patent bilaterally. PCAs grossly patent to their distal aspects without obvious stenosis. IMPRESSION: MRI HEAD IMPRESSION: 1. Large evolving acute left MCA territory infarct involving the majority of the left MCA territory. Associated petechial hemorrhage anteriorly without frank hemorrhagic transformation. Localized gyral swelling and edema without significant mass effect at this time. 2. Chronic right basal ganglia lacunar infarct. 3. Underlying mild chronic small vessel ischemic disease. MRA HEAD IMPRESSION: 1. Persistent distal left M1/proximal M2 occlusion with evidence for intraluminal thrombus in the left sylvian fissure. 2. Otherwise grossly stable intracranial MRA with no other large vessel occlusion or hemodynamically significant stenosis. Electronically Signed   By: Jeannine Boga M.D.   On: 07/01/2018 18:00   Mr Brain Wo Contrast  Result Date: 07/01/2018 CLINICAL DATA:  Follow-up examination for acute stroke. EXAM: MRI HEAD WITHOUT CONTRAST MRA HEAD WITHOUT CONTRAST TECHNIQUE: Multiplanar, multiecho pulse sequences of the brain and surrounding structures  were obtained without intravenous contrast. Angiographic images of the head were obtained using MRA technique without contrast. COMPARISON:  Prior CTA perfusion from 06/30/2018. FINDINGS: MRI HEAD FINDINGS Brain: Examination severely degraded by motion artifact. Large confluent area of restricted diffusion seen involving the majority of the left MCA territory, with involvement of the left frontal, parietal, and temporal lobes, consistent  with acute left MCA territory infarct. Associated localized gyral swelling and edema without significant regional mass effect at this time. Evidence for petechial hemorrhage superior to the left insula noted (series 10, image 69). No frank hemorrhagic transformation. Additional susceptibility artifact within the left sylvian fissure favored to be related to persistent intravascular thrombus. Remote right basal ganglia lacunar infarct with associated chronic hemosiderin staining noted. Additional chronic hemosiderin staining noted at the right parietal lobe as well. Underlying age-related cerebral atrophy with mild chronic small vessel ischemic disease. No mass lesion or midline shift. No hydrocephalus. No extra-axial fluid collection. Pituitary gland normal. Vascular: Susceptibility artifact within the left sylvian fissure, most likely intraluminal arterial thrombus within left MCA branches. Major intracranial vascular flow voids otherwise maintained at the skull base. Skull and upper cervical spine: Craniocervical junction normal. Diffuse osseous changes related to multiple myeloma noted. No scalp soft tissue abnormality. Sinuses/Orbits: Globes and orbital soft tissues within normal limits. Paranasal sinuses are largely clear. No mastoid effusion. Inner ear structures grossly normal. Other: None. MRA HEAD FINDINGS ANTERIOR CIRCULATION: Examination severely degraded by motion artifact. Distal cervical segments of the internal carotid arteries are patent with antegrade flow. Petrous,  cavernous, and supraclinoid segments patent without hemodynamically significant stenosis. A1 segments, anterior communicating artery, and anterior cerebral arteries grossly patent without stenosis. Proximal and mid left M1 patent. There is persistent occlusion of distal left M1/proximal M2 at the base of the sylvian fissure (series 4, image 60). Persistent flow within the anterior temporal branch. No other significant flow within the left MCA territory. Right M1 widely patent without stenosis. No proximal right M2 occlusion. Distal right MCA branches grossly perfused. POSTERIOR CIRCULATION: Vertebral arteries grossly patent to the vertebrobasilar junction without stenosis. Posterior inferior cerebral arteries patent bilaterally. Basilar patent to its distal aspect without obvious stenosis. Superior cerebral arteries patent bilaterally. PCAs grossly patent to their distal aspects without obvious stenosis. IMPRESSION: MRI HEAD IMPRESSION: 1. Large evolving acute left MCA territory infarct involving the majority of the left MCA territory. Associated petechial hemorrhage anteriorly without frank hemorrhagic transformation. Localized gyral swelling and edema without significant mass effect at this time. 2. Chronic right basal ganglia lacunar infarct. 3. Underlying mild chronic small vessel ischemic disease. MRA HEAD IMPRESSION: 1. Persistent distal left M1/proximal M2 occlusion with evidence for intraluminal thrombus in the left sylvian fissure. 2. Otherwise grossly stable intracranial MRA with no other large vessel occlusion or hemodynamically significant stenosis. Electronically Signed   By: Jeannine Boga M.D.   On: 07/01/2018 18:00   Ct Cerebral Perfusion W Contrast  Result Date: 06/30/2018 CLINICAL DATA:  Initial evaluation for acute stroke. EXAM: CT ANGIOGRAPHY HEAD AND NECK CT PERFUSION BRAIN TECHNIQUE: Multidetector CT imaging of the head and neck was performed using the standard protocol during bolus  administration of intravenous contrast. Multiplanar CT image reconstructions and MIPs were obtained to evaluate the vascular anatomy. Carotid stenosis measurements (when applicable) are obtained utilizing NASCET criteria, using the distal internal carotid diameter as the denominator. Multiphase CT imaging of the brain was performed following IV bolus contrast injection. Subsequent parametric perfusion maps were calculated using RAPID software. CONTRAST:  140m ISOVUE-370 IOPAMIDOL (ISOVUE-370) INJECTION 76% COMPARISON:  Prior noncontrast CT from earlier the same day. FINDINGS: CTA NECK FINDINGS Aortic arch: Visualized aortic arch of normal caliber with normal branch pattern. No hemodynamically significant stenosis about the origin of the great vessels. Visualized subclavian arteries widely patent. Right carotid system: Right common carotid artery tortuous proximally but widely patent to the bifurcation.  Mild atheromatous plaque about the proximal right ICA without stenosis. Right ICA widely patent distally to the skull base without stenosis, dissection, or occlusion Left carotid system: Left common carotid artery tortuous proximally but widely patent to the bifurcation without stenosis. Mild plaque about the left bifurcation without hemodynamically significant stenosis. Left ICA patent distally to the skull base without stenosis, dissection, or occlusion. Vertebral arteries: Both of the vertebral arteries arise from the subclavian arteries. Vertebral arteries widely patent within the neck without stenosis, dissection, or occlusion. Skeleton: Innumerable lytic lesions seen diffusely throughout the visualized spine, compatible with history of multiple myeloma. No acute osseous abnormality. Other neck: Soft tissues of the neck demonstrate no acute finding. Enlarged goiter is change of the left lobe of thyroid with substernal extension. Upper chest: Visualized upper chest within normal limits. Partially visualized lungs  are clear. Review of the MIP images confirms the above findings CTA HEAD FINDINGS Anterior circulation: Internal carotid arteries patent to the termini without hemodynamically significant stenosis. A1 segments patent. Anterior communicating artery normal. Anterior cerebral arteries patent to their distal aspects. Left M1 patent proximally. There is prepped occlusion of the distal left M1/proximal M2 at the left MCA bifurcation (series 9, image 96). Attenuated and reduced flow seen distally within left MCA branches. Right M1 patent. Normal right MCA bifurcation. Distal right MCA branches well perfused. Posterior circulation: Vertebral arteries diminutive but patent to the vertebrobasilar junction without stenosis. Left PICA patent. Right PICA not well visualized. Basilar diminutive but patent to its distal aspect without stenosis. Superior cerebral arteries patent proximally. Fetal type origin of the left PCA. Hypoplastic right P1 with robust right posterior communicating artery. PCAs patent to their distal aspects. Venous sinuses: Grossly patent, although not well assessed due to timing of the contrast bolus. Anatomic variants: Predominant fetal type origin of the PCAs. 4 mm focal outpouching arising from the right MCA bifurcation, suspicious for aneurysm. Delayed phase: Not performed. Review of the MIP images confirms the above findings CT Brain Perfusion Findings: CBF (<30%) Volume: 62m Perfusion (Tmax>6.0s) volume: 980mMismatch Volume: 6977mnfarction Location:Anterior left MCA distribution, involving the left insular region and operculum. IMPRESSION: 1. Positive CTA with EVLO of distal left M1/proximal M2 branch at the base of the sylvian fissure. Acute left MCA territory infarct with surrounding penumbra as above. 2. Additional mild atheromatous change throughout the major arterial vasculature of the head and neck as above. No other hemodynamically significant or correctable identified. 3. 4 mm right MCA  bifurcation aneurysm. 4. Innumerable lucent lesions throughout the visualized skeleton, compatible with multiple myeloma. Findings discussed with Dr. AroRory Percy approximately 5:45 p.m. on 06/30/2018. Electronically Signed   By: BenJeannine BogaD.   On: 06/30/2018 19:21   Dg Chest Port 1 View  Result Date: 07/02/2018 CLINICAL DATA:  Shortness of breath, cough, history of asthma, hypertension, acute CVA. EXAM: PORTABLE CHEST 1 VIEW COMPARISON:  PA and lateral chest x-rays of July 01, 2018 and December 10, 2016. FINDINGS: The patient is rotated toward the right. The lungs are adequately inflated. The interstitial markings are coarse at both bases. The cardiac silhouette is enlarged. The pulmonary vascularity is normal. Old right lateral rib fractures are present and stable. There are degenerative changes of both shoulders. IMPRESSION: Persistent bibasilar atelectasis. Mild enlargement of the cardiac silhouette without pulmonary vascular congestion or pulmonary edema. Electronically Signed   By: David  JorMartiniqueD.   On: 07/02/2018 11:48   Dg Abd Portable 1v  Result Date: 07/02/2018 CLINICAL DATA:  Feeding tube placement EXAM: PORTABLE ABDOMEN - 1 VIEW COMPARISON:  None. FINDINGS: The tip of a feeding catheter is positioned in the second portion of the duodenum. Visualized abdomen demonstrates nonspecific bowel gas pattern. IMPRESSION: Feeding tube is transpyloric with the tip in the distal descending duodenum. Electronically Signed   By: Misty Stanley M.D.   On: 07/02/2018 13:31   Dg Swallowing Func-speech Pathology  Result Date: 07/04/2018 Objective Swallowing Evaluation: Type of Study: MBS-Modified Barium Swallow Study  Patient Details Name: KAILLY RICHOUX MRN: [id number] Date of Birth: 1946-07-16 Today's Date: 07/04/2018 Time: SLP Start Time (ACUTE ONLY): 1335 -SLP Stop Time (ACUTE ONLY): 1400 SLP Time Calculation (min) (ACUTE ONLY): 25 min Past Medical History: Past Medical History: Diagnosis  Date . Arthritis  . Asthma  . Cancer ([place])  . Hypertension  Past Surgical History: Past Surgical History: Procedure Laterality Date . APPENDECTOMY   . CHOLECYSTECTOMY   . ESOPHAGOGASTRODUODENOSCOPY N/A 11/24/2015  Procedure: ESOPHAGOGASTRODUODENOSCOPY (EGD);  Surgeon: Clarene Essex, MD;  Location: [facility];  Service: Endoscopy;  Laterality: N/A; . RADIOLOGY WITH ANESTHESIA N/A 06/30/2018  Procedure: IR WITH ANESTHESIA;  Surgeon: Luanne Bras, MD;  Location: [facility];  Service: Radiology;  Laterality: N/A; . TUBAL LIGATION   HPI: 72 year old female admitted 06/30/18 with stroke symptoms. PMH: arthritis, asthma, HTN. MRI pending.  Subjective: Pt resting in bed. Family present. Assessment / Plan / Recommendation CHL IP CLINICAL IMPRESSIONS 07/04/2018 Clinical Impression Pt demonstrates slow oral manipulation with max hand over hand assist needed to elicit automatic responses in MBS setting (more so than at bedside). Oral holding observed with puree and mechanical soft solids. Liquids that spill to the vestibule more readily trigger full oral transit and swallow reponse, but nectar is aspirated without sensation prior to the swallow. Honey is tolerated quite well and there are no pharyngeal residuals. Recommend pt initaite a puree diet with honey thick liquids. Expect improved tolerance with more natural meal setting. If pt consumes adequate quantities over the next 24-48 hourse could consider NG tube removal. SLP Visit Diagnosis Dysphagia, oropharyngeal phase (R13.12) Attention and concentration deficit following -- Frontal lobe and executive function deficit following -- Impact on safety and function Mild aspiration risk   CH[facility] IP TREATMENT RECOMMENDATION 07/04/2018 Treatment Recommendations Therapy as outlined in treatment plan below   Prognosis 07/01/2018 Prognosis for Safe Diet Advancement Fair Barriers to Reach Goals Language deficits;Severity of deficits Barriers/Prognosis Comment -- [facility] IP DIET RECOMMENDATION  07/04/2018 SLP Diet Recommendations Dysphagia 1 (Puree) solids;Honey thick liquids Liquid Administration via Cup Medication Administration Crushed with puree Compensations Slow rate;Small sips/bites Postural Changes Seated upright at 90 degrees   CH[facility] IP OTHER RECOMMENDATIONS 07/04/2018 Recommended Consults -- Oral Care Recommendations -- Other Recommendations Have oral suction available   [facility] IP FOLLOW UP RECOMMENDATIONS 07/04/2018 Follow up Recommendations Skilled Nursing facility   [facility] IP FREQUENCY AND DURATION 07/04/2018 Speech Therapy Frequency (ACUTE ONLY) min 2x/week Treatment Duration 2 weeks      [facility] IP ORAL PHASE 07/04/2018 Oral Phase Impaired Oral - Pudding Teaspoon -- Oral - Pudding Cup -- Oral - Honey Teaspoon -- Oral - Honey Cup Delayed oral transit;Decreased bolus cohesion;Right anterior bolus loss Oral - Nectar Teaspoon Delayed oral transit;Decreased bolus cohesion;Right anterior bolus loss Oral - Nectar Cup Delayed oral transit;Decreased bolus cohesion;Right anterior bolus loss Oral - Nectar Straw -- Oral - Thin Teaspoon -- Oral - Thin Cup -- Oral - Thin Straw -- Oral - Puree Delayed oral transit;Decreased bolus cohesion;Right anterior bolus loss Oral -  Mech Soft Delayed oral transit;Decreased bolus cohesion;Right anterior bolus loss Oral - Regular -- Oral - Multi-Consistency -- Oral - Pill -- Oral Phase - Comment --  CHL IP PHARYNGEAL PHASE 07/04/2018 Pharyngeal Phase Impaired Pharyngeal- Pudding Teaspoon -- Pharyngeal -- Pharyngeal- Pudding Cup -- Pharyngeal -- Pharyngeal- Honey Teaspoon Delayed swallow initiation-pyriform sinuses Pharyngeal -- Pharyngeal- Honey Cup Delayed swallow initiation-pyriform sinuses Pharyngeal -- Pharyngeal- Nectar Teaspoon -- Pharyngeal -- Pharyngeal- Nectar Cup Delayed swallow initiation-pyriform sinuses;Penetration/Aspiration before swallow;Penetration/Aspiration during swallow;Trace aspiration Pharyngeal Material enters airway, passes BELOW cords without attempt by patient  to eject out (silent aspiration) Pharyngeal- Nectar Straw -- Pharyngeal -- Pharyngeal- Thin Teaspoon -- Pharyngeal -- Pharyngeal- Thin Cup -- Pharyngeal -- Pharyngeal- Thin Straw -- Pharyngeal -- Pharyngeal- Puree Delayed swallow initiation-vallecula Pharyngeal -- Pharyngeal- Mechanical Soft NT Pharyngeal -- Pharyngeal- Regular -- Pharyngeal -- Pharyngeal- Multi-consistency -- Pharyngeal -- Pharyngeal- Pill -- Pharyngeal -- Pharyngeal Comment --  No flowsheet data found. DeBlois, Katherene Ponto 07/04/2018, 2:34 PM              Ct Head Code Stroke Wo Contrast  Result Date: 06/30/2018 CLINICAL DATA:  Code stroke. Initial evaluation for acute right-sided gaze. EXAM: CT HEAD WITHOUT CONTRAST TECHNIQUE: Contiguous axial images were obtained from the base of the skull through the vertex without intravenous contrast. COMPARISON:  Prior CT from 11/24/2015. FINDINGS: Brain: Age-related cerebral atrophy with chronic small vessel ischemic disease. Remote lacunar infarct present within the right basal ganglia. Subtle hypodensity seen involving the left insular ribbon, consistent with evolving acute ischemic left MCA territory infarct. No acute intracranial hemorrhage. No mass lesion, midline shift or mass effect. No hydrocephalus. No extra-axial fluid collection. Vascular: No definite hyperdense vessel identified. Scattered vascular calcifications noted within the carotid siphons. Skull: Scalp soft tissues within normal limits. Multiple lucent lesion seen throughout the calvarium, consistent with multiple myeloma. Sinuses/Orbits: Left-sided gaze noted. Paranasal sinuses and mastoid air cells are clear. Other: None. ASPECTS [facility] Stroke Program Early CT Score) - Ganglionic level infarction (caudate, lentiform nuclei, internal capsule, insula, M1-M3 cortex): 6 - Supraganglionic infarction (M4-M6 cortex): 3 Total score (0-10 with 10 being normal): 9 IMPRESSION: 1. Abnormal hypodensity involving the left insular cortex,  suspicious for acute left MCA territory infarct. 2. ASPECTS is 9. These results were communicated to Dr. Rory Percy at 6:25 pmon 9/9/2019by text page via the [facility] messaging system. Electronically Signed   By: Jeannine Boga M.D.   On: 06/30/2018 18:25    TTE - Left ventricle: The cavity size was normal. Wall thickness was normal. Systolic function was normal. The estimated ejection fraction was in the range of 55% to 60%. Wall motion was normal; there were no regional wall motion abnormalities. - Aortic valve: There was trivial regurgitation. - Mitral valve: There was mild regurgitation. - Left atrium: The atrium was severely dilated. - Right atrium: The atrium was mildly to moderately dilated. - Tricuspid valve: There was moderate regurgitation. - Pulmonary arteries: Systolic pressure was mildly increased. PA peak pressure: 33 mm Hg (S).  PHYSICAL EXAM General - Well nourished, well developed, not in acute distress. Cardiovascular - irregularly irregular heart rate and rhythm.  Neuro - eyes open, still global aphasia, not following commands, no language output. Not repeating or naming. Left forced gaze, right neglect, not blinking to visual threat on the right. PERRL, not tracking on the right. RUE 0/5 and RLE mild withdraw on pain stimulation. LUE and LLE spontaneous movement and against gravity. Sensation, coordination and gait not tested.  No change in  neuro exam comparing with yesterdsay   ASSESSMENT/PLAN Ms. ELY SPRAGG is a 72 y.o. female with history of HTN, MM in remission admitted for left gaze, aphasia and right weakness. No tPA given due to OSW.    Stroke:  left MCA large infarct due to left M1 occlusion, embolic secondary to new diagnosis of afib  Resultant full blown of left MCA syndrome  CT old right CR infarct  CTA head and neck left M1 M2 bifurcation occlusion  DSA - left M2 occlusion with initial TICI2b reperfusion but then re-occluded. Unsuccessful  intervention  MRI confirmed left large MCA infarct  MRA persistent left M1 occlusion  2D Echo EF 55-60%  LDL 62  HgbA1c 5.3  SCDs for VTE prophylaxis  No antithrombotic prior to admission, now on plavix after po access. ASA allergy noted. Will consider DOACs in 14 days post stroke if neuro stable.   Ongoing aggressive stroke risk factor management  Therapy recommendations:  SNF  Disposition:  Pending. [facility] for d/c Monday depending on po tolerance  Afib - new diagnosis  Tele and EKG confirmed afib rhythm  Intermittent RVR, resumed coreg 12.64m bid  Likely the cause of current stroke  Rate under control  May consider DOACs in 14 days post stroke if neuro stable  Dysphagia  cortrak placed for TF  Passed swallow, now diet increased to D1 honey thick  Not eating > 50% meal  Continue TF and IVF for now  Hold PEG for now  Leukocytosis  WBC 8.4->19.8->15.5->16.0->14.3  UA neg  CXR 07/01/18 no active disease  CXR 07/02/18 no active disease  Afebrile  Close CBC monitoring  Hypertension Stable off cleviprex BP goal < 160 now On home meds with norvasc and coreg  Long term BP goal 130-150 given left M1 persistent occlusion  Other Stroke Risk Factors  Advanced age  Other Active Problems  MM in remission  Hospital day # 5  JRosalin Hawking MD PhD Stroke Neurology 07/05/2018 9:17 PM    To contact Stroke Continuity provider, please refer to [email or web address] After hours, contact General Neurology

## 2018-07-06 LAB — GLUCOSE, CAPILLARY
Glucose-Capillary: 128 mg/dL — ABNORMAL HIGH (ref 70–99)
Glucose-Capillary: 118 mg/dL — ABNORMAL HIGH (ref 70–99)
Glucose-Capillary: 117 mg/dL — ABNORMAL HIGH (ref 70–99)
Glucose-Capillary: 112 mg/dL — ABNORMAL HIGH (ref 70–99)
Glucose-Capillary: 129 mg/dL — ABNORMAL HIGH (ref 70–99)

## 2018-07-06 LAB — CBC
HCT: 36.4 % (ref 36.0–46.0)
Hemoglobin: 11.5 g/dL — ABNORMAL LOW (ref 12.0–15.0)
MCH: 28.9 pg (ref 26.0–34.0)
MCHC: 31.6 g/dL (ref 30.0–36.0)
MCV: 91.5 fL (ref 78.0–100.0)
PLATELETS: 175 10*3/uL (ref 150–400)
RBC: 3.98 MIL/uL (ref 3.87–5.11)
RDW: 13.4 % (ref 11.5–15.5)
WBC: 14 10*3/uL — AB (ref 4.0–10.5)

## 2018-07-06 LAB — BASIC METABOLIC PANEL
ANION GAP: 9 (ref 5–15)
BUN: 13 mg/dL (ref 8–23)
CALCIUM: 9 mg/dL (ref 8.9–10.3)
CO2: 23 mmol/L (ref 22–32)
CREATININE: 0.62 mg/dL (ref 0.44–1.00)
Chloride: 112 mmol/L — ABNORMAL HIGH (ref 98–111)
Glucose, Bld: 136 mg/dL — ABNORMAL HIGH (ref 70–99)
Potassium: 3.2 mmol/L — ABNORMAL LOW (ref 3.5–5.1)
SODIUM: 144 mmol/L (ref 135–145)

## 2018-07-06 MED ORDER — POTASSIUM CHLORIDE 20 MEQ/15ML (10%) PO SOLN
40.0000 meq | Freq: Two times a day (BID) | ORAL | Status: DC
  Administered 2018-07-06: 40 meq via ORAL
  Filled 2018-07-06: qty 30

## 2018-07-06 MED ORDER — POTASSIUM CHLORIDE 20 MEQ/15ML (10%) PO SOLN
40.0000 meq | Freq: Two times a day (BID) | ORAL | Status: AC
  Administered 2018-07-06 – 2018-07-07 (×3): 40 meq
  Filled 2018-07-06 (×3): qty 30

## 2018-07-06 NOTE — Progress Notes (Signed)
STROKE TEAM PROGRESS NOTE   SUBJECTIVE (INTERVAL HISTORY) Neuro stable without significant change.   OBJECTIVE Temp:  [97.3 F (36.3 C)-99.2 F (37.3 C)] 98.2 F (36.8 C) (09/15 0850) Pulse Rate:  [81-98] 81 (09/15 1153) Cardiac Rhythm: Atrial fibrillation (09/14 1900) Resp:  [18] 18 (09/15 1153) BP: (97-154)/(54-90) 97/54 (09/15 1153) SpO2:  [97 %-100 %] 100 % (09/15 1153) Weight:  [66.3 kg] 66.3 kg (09/15 0500)  Recent Labs  Lab 07/05/18 2005 07/05/18 2333 07/06/18 0426 07/06/18 0806 07/06/18 1116  GLUCAP 126* 124* 128* 118* 117*   Recent Labs  Lab 07/02/18 0347 07/03/18 0222 07/04/18 0541 07/05/18 0724 07/06/18 0650  NA 142 139 141 141 144  K 3.3* 3.2* 3.5 3.1* 3.2*  CL 111 108 113* 111 112*  CO2 19* 21* 20* 21* 23  GLUCOSE 99 113* 129* 133* 136*  BUN _0 CREATININE 0.72 0.69 0.65 0.64 0.62  CALCIUM 8.1* 7.9* 8.3* 8.6* 9.0   Recent Labs  Lab 06/30/18 1812  AST 23  ALT 13  ALKPHOS 107  BILITOT 1.3*  PROT 7.7  ALBUMIN 3.8   Recent Labs  Lab 06/30/18 1812 07/02/18 0347 07/03/18 0222 07/04/18 0541 07/05/18 0724 07/06/18 0650  WBC 8.4 19.8* 15.5* 16.0* 14.3* 14.0*  NEUTROABS 6.2  --   --   --   --   --   HGB 12.5 10.9* 10.8* 11.7* 11.7* 11.5*  HCT 40.3 33.8* 33.8* 36.0 36.6 36.4  MCV 93.5 91.8 91.4 90.7 91.0 91.5  PLT 125* 120* 120* 134* 170 175   No results for input(s): CKTOTAL, CKMB, CKMBINDEX, TROPONINI in the last 168 hours. No results for input(s): LABPROT, INR in the last 72 hours. No results for input(s): COLORURINE, LABSPEC, [place], GLUCOSEU, HGBUR, BILIRUBINUR, KETONESUR, PROTEINUR, UROBILINOGEN, NITRITE, LEUKOCYTESUR in the last 72 hours.  Invalid input(s): APPERANCEUR     Component Value Date/Time   CHOL 134 07/01/2018 0503   TRIG 74 07/01/2018 0503   HDL 57 07/01/2018 0503   CHOLHDL 2.4 07/01/2018 0503   VLDL 15 07/01/2018 0503   LDLCALC 62 07/01/2018 0503   Lab Results  Component Value Date   HGBA1C 5.3  07/01/2018      Component Value Date/Time   COCAINSCRNUR Negative 01/14/2017 1215    No results for input(s): ETH in the last 168 hours.  Ct Angio Head W Or Wo Contrast  Result Date: 06/30/2018 CLINICAL DATA:  Initial evaluation for acute stroke. EXAM: CT ANGIOGRAPHY HEAD AND NECK CT PERFUSION BRAIN TECHNIQUE: Multidetector CT imaging of the head and neck was performed using the standard protocol during bolus administration of intravenous contrast. Multiplanar CT image reconstructions and MIPs were obtained to evaluate the vascular anatomy. Carotid stenosis measurements (when applicable) are obtained utilizing NASCET criteria, using the distal internal carotid diameter as the denominator. Multiphase CT imaging of the brain was performed following IV bolus contrast injection. Subsequent parametric perfusion maps were calculated using RAPID software. CONTRAST:  170m ISOVUE-370 IOPAMIDOL (ISOVUE-370) INJECTION 76% COMPARISON:  Prior noncontrast CT from earlier the same day. FINDINGS: CTA NECK FINDINGS Aortic arch: Visualized aortic arch of normal caliber with normal branch pattern. No hemodynamically significant stenosis about the origin of the great vessels. Visualized subclavian arteries widely patent. Right carotid system: Right common carotid artery tortuous proximally but widely patent to the bifurcation. Mild atheromatous plaque about the proximal right ICA without stenosis. Right ICA widely patent distally to the skull base without stenosis, dissection, or occlusion Left carotid system: Left  common carotid artery tortuous proximally but widely patent to the bifurcation without stenosis. Mild plaque about the left bifurcation without hemodynamically significant stenosis. Left ICA patent distally to the skull base without stenosis, dissection, or occlusion. Vertebral arteries: Both of the vertebral arteries arise from the subclavian arteries. Vertebral arteries widely patent within the neck without  stenosis, dissection, or occlusion. Skeleton: Innumerable lytic lesions seen diffusely throughout the visualized spine, compatible with history of multiple myeloma. No acute osseous abnormality. Other neck: Soft tissues of the neck demonstrate no acute finding. Enlarged goiter is change of the left lobe of thyroid with substernal extension. Upper chest: Visualized upper chest within normal limits. Partially visualized lungs are clear. Review of the MIP images confirms the above findings CTA HEAD FINDINGS Anterior circulation: Internal carotid arteries patent to the termini without hemodynamically significant stenosis. A1 segments patent. Anterior communicating artery normal. Anterior cerebral arteries patent to their distal aspects. Left M1 patent proximally. There is prepped occlusion of the distal left M1/proximal M2 at the left MCA bifurcation (series 9, image 96). Attenuated and reduced flow seen distally within left MCA branches. Right M1 patent. Normal right MCA bifurcation. Distal right MCA branches well perfused. Posterior circulation: Vertebral arteries diminutive but patent to the vertebrobasilar junction without stenosis. Left PICA patent. Right PICA not well visualized. Basilar diminutive but patent to its distal aspect without stenosis. Superior cerebral arteries patent proximally. Fetal type origin of the left PCA. Hypoplastic right P1 with robust right posterior communicating artery. PCAs patent to their distal aspects. Venous sinuses: Grossly patent, although not well assessed due to timing of the contrast bolus. Anatomic variants: Predominant fetal type origin of the PCAs. 4 mm focal outpouching arising from the right MCA bifurcation, suspicious for aneurysm. Delayed phase: Not performed. Review of the MIP images confirms the above findings CT Brain Perfusion Findings: CBF (<30%) Volume: 36m Perfusion (Tmax>6.0s) volume: 958mMismatch Volume: 6970mnfarction Location:Anterior left MCA distribution,  involving the left insular region and operculum. IMPRESSION: 1. Positive CTA with EVLO of distal left M1/proximal M2 branch at the base of the sylvian fissure. Acute left MCA territory infarct with surrounding penumbra as above. 2. Additional mild atheromatous change throughout the major arterial vasculature of the head and neck as above. No other hemodynamically significant or correctable identified. 3. 4 mm right MCA bifurcation aneurysm. 4. Innumerable lucent lesions throughout the visualized skeleton, compatible with multiple myeloma. Findings discussed with Dr. AroRory Percy approximately 5:45 p.m. on 06/30/2018. Electronically Signed   By: BenJeannine BogaD.   On: 06/30/2018 19:21   Dg Chest 2 View  Result Date: 07/01/2018 CLINICAL DATA:  Acute ischemic stroke. EXAM: CHEST - 2 VIEW COMPARISON:  12/10/2016 FINDINGS: Patient is rotated to the right. Both lungs are clear. No evidence of pneumothorax or pleural effusion. IMPRESSION: No active lung disease. Electronically Signed   By: JohEarle GellD.   On: 07/01/2018 19:38   Ct Angio Neck W Or Wo Contrast  Result Date: 06/30/2018 CLINICAL DATA:  Initial evaluation for acute stroke. EXAM: CT ANGIOGRAPHY HEAD AND NECK CT PERFUSION BRAIN TECHNIQUE: Multidetector CT imaging of the head and neck was performed using the standard protocol during bolus administration of intravenous contrast. Multiplanar CT image reconstructions and MIPs were obtained to evaluate the vascular anatomy. Carotid stenosis measurements (when applicable) are obtained utilizing NASCET criteria, using the distal internal carotid diameter as the denominator. Multiphase CT imaging of the brain was performed following IV bolus contrast injection. Subsequent parametric perfusion maps were calculated  using RAPID software. CONTRAST:  126m ISOVUE-370 IOPAMIDOL (ISOVUE-370) INJECTION 76% COMPARISON:  Prior noncontrast CT from earlier the same day. FINDINGS: CTA NECK FINDINGS Aortic arch:  Visualized aortic arch of normal caliber with normal branch pattern. No hemodynamically significant stenosis about the origin of the great vessels. Visualized subclavian arteries widely patent. Right carotid system: Right common carotid artery tortuous proximally but widely patent to the bifurcation. Mild atheromatous plaque about the proximal right ICA without stenosis. Right ICA widely patent distally to the skull base without stenosis, dissection, or occlusion Left carotid system: Left common carotid artery tortuous proximally but widely patent to the bifurcation without stenosis. Mild plaque about the left bifurcation without hemodynamically significant stenosis. Left ICA patent distally to the skull base without stenosis, dissection, or occlusion. Vertebral arteries: Both of the vertebral arteries arise from the subclavian arteries. Vertebral arteries widely patent within the neck without stenosis, dissection, or occlusion. Skeleton: Innumerable lytic lesions seen diffusely throughout the visualized spine, compatible with history of multiple myeloma. No acute osseous abnormality. Other neck: Soft tissues of the neck demonstrate no acute finding. Enlarged goiter is change of the left lobe of thyroid with substernal extension. Upper chest: Visualized upper chest within normal limits. Partially visualized lungs are clear. Review of the MIP images confirms the above findings CTA HEAD FINDINGS Anterior circulation: Internal carotid arteries patent to the termini without hemodynamically significant stenosis. A1 segments patent. Anterior communicating artery normal. Anterior cerebral arteries patent to their distal aspects. Left M1 patent proximally. There is prepped occlusion of the distal left M1/proximal M2 at the left MCA bifurcation (series 9, image 96). Attenuated and reduced flow seen distally within left MCA branches. Right M1 patent. Normal right MCA bifurcation. Distal right MCA branches well perfused.  Posterior circulation: Vertebral arteries diminutive but patent to the vertebrobasilar junction without stenosis. Left PICA patent. Right PICA not well visualized. Basilar diminutive but patent to its distal aspect without stenosis. Superior cerebral arteries patent proximally. Fetal type origin of the left PCA. Hypoplastic right P1 with robust right posterior communicating artery. PCAs patent to their distal aspects. Venous sinuses: Grossly patent, although not well assessed due to timing of the contrast bolus. Anatomic variants: Predominant fetal type origin of the PCAs. 4 mm focal outpouching arising from the right MCA bifurcation, suspicious for aneurysm. Delayed phase: Not performed. Review of the MIP images confirms the above findings CT Brain Perfusion Findings: CBF (<30%) Volume: 237mPerfusion (Tmax>6.0s) volume: 9873mismatch Volume: 72m53mfarction Location:Anterior left MCA distribution, involving the left insular region and operculum. IMPRESSION: 1. Positive CTA with EVLO of distal left M1/proximal M2 branch at the base of the sylvian fissure. Acute left MCA territory infarct with surrounding penumbra as above. 2. Additional mild atheromatous change throughout the major arterial vasculature of the head and neck as above. No other hemodynamically significant or correctable identified. 3. 4 mm right MCA bifurcation aneurysm. 4. Innumerable lucent lesions throughout the visualized skeleton, compatible with multiple myeloma. Findings discussed with Dr. ArorRory Percyapproximately 5:45 p.m. on 06/30/2018. Electronically Signed   By: BenjJeannine Boga.   On: 06/30/2018 19:21   Mr Mra Jodene Namd Wo Contrast  Result Date: 07/01/2018 CLINICAL DATA:  Follow-up examination for acute stroke. EXAM: MRI HEAD WITHOUT CONTRAST MRA HEAD WITHOUT CONTRAST TECHNIQUE: Multiplanar, multiecho pulse sequences of the brain and surrounding structures were obtained without intravenous contrast. Angiographic images of the head  were obtained using MRA technique without contrast. COMPARISON:  Prior CTA perfusion from 06/30/2018. FINDINGS: MRI HEAD  FINDINGS Brain: Examination severely degraded by motion artifact. Large confluent area of restricted diffusion seen involving the majority of the left MCA territory, with involvement of the left frontal, parietal, and temporal lobes, consistent with acute left MCA territory infarct. Associated localized gyral swelling and edema without significant regional mass effect at this time. Evidence for petechial hemorrhage superior to the left insula noted (series 10, image 69). No frank hemorrhagic transformation. Additional susceptibility artifact within the left sylvian fissure favored to be related to persistent intravascular thrombus. Remote right basal ganglia lacunar infarct with associated chronic hemosiderin staining noted. Additional chronic hemosiderin staining noted at the right parietal lobe as well. Underlying age-related cerebral atrophy with mild chronic small vessel ischemic disease. No mass lesion or midline shift. No hydrocephalus. No extra-axial fluid collection. Pituitary gland normal. Vascular: Susceptibility artifact within the left sylvian fissure, most likely intraluminal arterial thrombus within left MCA branches. Major intracranial vascular flow voids otherwise maintained at the skull base. Skull and upper cervical spine: Craniocervical junction normal. Diffuse osseous changes related to multiple myeloma noted. No scalp soft tissue abnormality. Sinuses/Orbits: Globes and orbital soft tissues within normal limits. Paranasal sinuses are largely clear. No mastoid effusion. Inner ear structures grossly normal. Other: None. MRA HEAD FINDINGS ANTERIOR CIRCULATION: Examination severely degraded by motion artifact. Distal cervical segments of the internal carotid arteries are patent with antegrade flow. Petrous, cavernous, and supraclinoid segments patent without hemodynamically  significant stenosis. A1 segments, anterior communicating artery, and anterior cerebral arteries grossly patent without stenosis. Proximal and mid left M1 patent. There is persistent occlusion of distal left M1/proximal M2 at the base of the sylvian fissure (series 4, image 60). Persistent flow within the anterior temporal branch. No other significant flow within the left MCA territory. Right M1 widely patent without stenosis. No proximal right M2 occlusion. Distal right MCA branches grossly perfused. POSTERIOR CIRCULATION: Vertebral arteries grossly patent to the vertebrobasilar junction without stenosis. Posterior inferior cerebral arteries patent bilaterally. Basilar patent to its distal aspect without obvious stenosis. Superior cerebral arteries patent bilaterally. PCAs grossly patent to their distal aspects without obvious stenosis. IMPRESSION: MRI HEAD IMPRESSION: 1. Large evolving acute left MCA territory infarct involving the majority of the left MCA territory. Associated petechial hemorrhage anteriorly without frank hemorrhagic transformation. Localized gyral swelling and edema without significant mass effect at this time. 2. Chronic right basal ganglia lacunar infarct. 3. Underlying mild chronic small vessel ischemic disease. MRA HEAD IMPRESSION: 1. Persistent distal left M1/proximal M2 occlusion with evidence for intraluminal thrombus in the left sylvian fissure. 2. Otherwise grossly stable intracranial MRA with no other large vessel occlusion or hemodynamically significant stenosis. Electronically Signed   By: Jeannine Boga M.D.   On: 07/01/2018 18:00   Mr Brain Wo Contrast  Result Date: 07/01/2018 CLINICAL DATA:  Follow-up examination for acute stroke. EXAM: MRI HEAD WITHOUT CONTRAST MRA HEAD WITHOUT CONTRAST TECHNIQUE: Multiplanar, multiecho pulse sequences of the brain and surrounding structures were obtained without intravenous contrast. Angiographic images of the head were obtained using  MRA technique without contrast. COMPARISON:  Prior CTA perfusion from 06/30/2018. FINDINGS: MRI HEAD FINDINGS Brain: Examination severely degraded by motion artifact. Large confluent area of restricted diffusion seen involving the majority of the left MCA territory, with involvement of the left frontal, parietal, and temporal lobes, consistent with acute left MCA territory infarct. Associated localized gyral swelling and edema without significant regional mass effect at this time. Evidence for petechial hemorrhage superior to the left insula noted (series 10, image 69). No  frank hemorrhagic transformation. Additional susceptibility artifact within the left sylvian fissure favored to be related to persistent intravascular thrombus. Remote right basal ganglia lacunar infarct with associated chronic hemosiderin staining noted. Additional chronic hemosiderin staining noted at the right parietal lobe as well. Underlying age-related cerebral atrophy with mild chronic small vessel ischemic disease. No mass lesion or midline shift. No hydrocephalus. No extra-axial fluid collection. Pituitary gland normal. Vascular: Susceptibility artifact within the left sylvian fissure, most likely intraluminal arterial thrombus within left MCA branches. Major intracranial vascular flow voids otherwise maintained at the skull base. Skull and upper cervical spine: Craniocervical junction normal. Diffuse osseous changes related to multiple myeloma noted. No scalp soft tissue abnormality. Sinuses/Orbits: Globes and orbital soft tissues within normal limits. Paranasal sinuses are largely clear. No mastoid effusion. Inner ear structures grossly normal. Other: None. MRA HEAD FINDINGS ANTERIOR CIRCULATION: Examination severely degraded by motion artifact. Distal cervical segments of the internal carotid arteries are patent with antegrade flow. Petrous, cavernous, and supraclinoid segments patent without hemodynamically significant stenosis. A1  segments, anterior communicating artery, and anterior cerebral arteries grossly patent without stenosis. Proximal and mid left M1 patent. There is persistent occlusion of distal left M1/proximal M2 at the base of the sylvian fissure (series 4, image 60). Persistent flow within the anterior temporal branch. No other significant flow within the left MCA territory. Right M1 widely patent without stenosis. No proximal right M2 occlusion. Distal right MCA branches grossly perfused. POSTERIOR CIRCULATION: Vertebral arteries grossly patent to the vertebrobasilar junction without stenosis. Posterior inferior cerebral arteries patent bilaterally. Basilar patent to its distal aspect without obvious stenosis. Superior cerebral arteries patent bilaterally. PCAs grossly patent to their distal aspects without obvious stenosis. IMPRESSION: MRI HEAD IMPRESSION: 1. Large evolving acute left MCA territory infarct involving the majority of the left MCA territory. Associated petechial hemorrhage anteriorly without frank hemorrhagic transformation. Localized gyral swelling and edema without significant mass effect at this time. 2. Chronic right basal ganglia lacunar infarct. 3. Underlying mild chronic small vessel ischemic disease. MRA HEAD IMPRESSION: 1. Persistent distal left M1/proximal M2 occlusion with evidence for intraluminal thrombus in the left sylvian fissure. 2. Otherwise grossly stable intracranial MRA with no other large vessel occlusion or hemodynamically significant stenosis. Electronically Signed   By: Jeannine Boga M.D.   On: 07/01/2018 18:00   Ct Cerebral Perfusion W Contrast  Result Date: 06/30/2018 CLINICAL DATA:  Initial evaluation for acute stroke. EXAM: CT ANGIOGRAPHY HEAD AND NECK CT PERFUSION BRAIN TECHNIQUE: Multidetector CT imaging of the head and neck was performed using the standard protocol during bolus administration of intravenous contrast. Multiplanar CT image reconstructions and MIPs were  obtained to evaluate the vascular anatomy. Carotid stenosis measurements (when applicable) are obtained utilizing NASCET criteria, using the distal internal carotid diameter as the denominator. Multiphase CT imaging of the brain was performed following IV bolus contrast injection. Subsequent parametric perfusion maps were calculated using RAPID software. CONTRAST:  196m ISOVUE-370 IOPAMIDOL (ISOVUE-370) INJECTION 76% COMPARISON:  Prior noncontrast CT from earlier the same day. FINDINGS: CTA NECK FINDINGS Aortic arch: Visualized aortic arch of normal caliber with normal branch pattern. No hemodynamically significant stenosis about the origin of the great vessels. Visualized subclavian arteries widely patent. Right carotid system: Right common carotid artery tortuous proximally but widely patent to the bifurcation. Mild atheromatous plaque about the proximal right ICA without stenosis. Right ICA widely patent distally to the skull base without stenosis, dissection, or occlusion Left carotid system: Left common carotid artery tortuous proximally but widely  patent to the bifurcation without stenosis. Mild plaque about the left bifurcation without hemodynamically significant stenosis. Left ICA patent distally to the skull base without stenosis, dissection, or occlusion. Vertebral arteries: Both of the vertebral arteries arise from the subclavian arteries. Vertebral arteries widely patent within the neck without stenosis, dissection, or occlusion. Skeleton: Innumerable lytic lesions seen diffusely throughout the visualized spine, compatible with history of multiple myeloma. No acute osseous abnormality. Other neck: Soft tissues of the neck demonstrate no acute finding. Enlarged goiter is change of the left lobe of thyroid with substernal extension. Upper chest: Visualized upper chest within normal limits. Partially visualized lungs are clear. Review of the MIP images confirms the above findings CTA HEAD FINDINGS Anterior  circulation: Internal carotid arteries patent to the termini without hemodynamically significant stenosis. A1 segments patent. Anterior communicating artery normal. Anterior cerebral arteries patent to their distal aspects. Left M1 patent proximally. There is prepped occlusion of the distal left M1/proximal M2 at the left MCA bifurcation (series 9, image 96). Attenuated and reduced flow seen distally within left MCA branches. Right M1 patent. Normal right MCA bifurcation. Distal right MCA branches well perfused. Posterior circulation: Vertebral arteries diminutive but patent to the vertebrobasilar junction without stenosis. Left PICA patent. Right PICA not well visualized. Basilar diminutive but patent to its distal aspect without stenosis. Superior cerebral arteries patent proximally. Fetal type origin of the left PCA. Hypoplastic right P1 with robust right posterior communicating artery. PCAs patent to their distal aspects. Venous sinuses: Grossly patent, although not well assessed due to timing of the contrast bolus. Anatomic variants: Predominant fetal type origin of the PCAs. 4 mm focal outpouching arising from the right MCA bifurcation, suspicious for aneurysm. Delayed phase: Not performed. Review of the MIP images confirms the above findings CT Brain Perfusion Findings: CBF (<30%) Volume: 53m Perfusion (Tmax>6.0s) volume: 935mMismatch Volume: 6915mnfarction Location:Anterior left MCA distribution, involving the left insular region and operculum. IMPRESSION: 1. Positive CTA with EVLO of distal left M1/proximal M2 branch at the base of the sylvian fissure. Acute left MCA territory infarct with surrounding penumbra as above. 2. Additional mild atheromatous change throughout the major arterial vasculature of the head and neck as above. No other hemodynamically significant or correctable identified. 3. 4 mm right MCA bifurcation aneurysm. 4. Innumerable lucent lesions throughout the visualized skeleton,  compatible with multiple myeloma. Findings discussed with Dr. AroRory Percy approximately 5:45 p.m. on 06/30/2018. Electronically Signed   By: BenJeannine BogaD.   On: 06/30/2018 19:21   Dg Chest Port 1 View  Result Date: 07/02/2018 CLINICAL DATA:  Shortness of breath, cough, history of asthma, hypertension, acute CVA. EXAM: PORTABLE CHEST 1 VIEW COMPARISON:  PA and lateral chest x-rays of July 01, 2018 and December 10, 2016. FINDINGS: The patient is rotated toward the right. The lungs are adequately inflated. The interstitial markings are coarse at both bases. The cardiac silhouette is enlarged. The pulmonary vascularity is normal. Old right lateral rib fractures are present and stable. There are degenerative changes of both shoulders. IMPRESSION: Persistent bibasilar atelectasis. Mild enlargement of the cardiac silhouette without pulmonary vascular congestion or pulmonary edema. Electronically Signed   By: David  JorMartiniqueD.   On: 07/02/2018 11:48   Dg Abd Portable 1v  Result Date: 07/02/2018 CLINICAL DATA:  Feeding tube placement EXAM: PORTABLE ABDOMEN - 1 VIEW COMPARISON:  None. FINDINGS: The tip of a feeding catheter is positioned in the second portion of the duodenum. Visualized abdomen demonstrates nonspecific bowel gas pattern.  IMPRESSION: Feeding tube is transpyloric with the tip in the distal descending duodenum. Electronically Signed   By: Misty Stanley M.D.   On: 07/02/2018 13:31   Dg Swallowing Func-speech Pathology  Result Date: 07/04/2018 Objective Swallowing Evaluation: Type of Study: MBS-Modified Barium Swallow Study  Patient Details Name: Ruth Sanchez MRN: [id number] Date of Birth: [birth year] Today's Date: 07/04/2018 Time: SLP Start Time (ACUTE ONLY): 1335 -SLP Stop Time (ACUTE ONLY): 1400 SLP Time Calculation (min) (ACUTE ONLY): 25 min Past Medical History: Past Medical History: Diagnosis Date . Arthritis  . Asthma  . Cancer ([place])  . Hypertension  Past Surgical History: Past  Surgical History: Procedure Laterality Date . APPENDECTOMY   . CHOLECYSTECTOMY   . ESOPHAGOGASTRODUODENOSCOPY N/A 11/24/2015  Procedure: ESOPHAGOGASTRODUODENOSCOPY (EGD);  Surgeon: Clarene Essex, MD;  Location: [facility];  Service: Endoscopy;  Laterality: N/A; . RADIOLOGY WITH ANESTHESIA N/A 06/30/2018  Procedure: IR WITH ANESTHESIA;  Surgeon: Luanne Bras, MD;  Location: [facility];  Service: Radiology;  Laterality: N/A; . TUBAL LIGATION   HPI: 72 year old female admitted 06/30/18 with stroke symptoms. PMH: arthritis, asthma, HTN. MRI pending.  Subjective: Pt resting in bed. Family present. Assessment / Plan / Recommendation CHL IP CLINICAL IMPRESSIONS 07/04/2018 Clinical Impression Pt demonstrates slow oral manipulation with max hand over hand assist needed to elicit automatic responses in MBS setting (more so than at bedside). Oral holding observed with puree and mechanical soft solids. Liquids that spill to the vestibule more readily trigger full oral transit and swallow reponse, but nectar is aspirated without sensation prior to the swallow. Honey is tolerated quite well and there are no pharyngeal residuals. Recommend pt initaite a puree diet with honey thick liquids. Expect improved tolerance with more natural meal setting. If pt consumes adequate quantities over the next 24-48 hourse could consider NG tube removal. SLP Visit Diagnosis Dysphagia, oropharyngeal phase (R13.12) Attention and concentration deficit following -- Frontal lobe and executive function deficit following -- Impact on safety and function Mild aspiration risk   CHL IP TREATMENT RECOMMENDATION 07/04/2018 Treatment Recommendations Therapy as outlined in treatment plan below   Prognosis 07/01/2018 Prognosis for Safe Diet Advancement Fair Barriers to Reach Goals Language deficits;Severity of deficits Barriers/Prognosis Comment -- CHL IP DIET RECOMMENDATION 07/04/2018 SLP Diet Recommendations Dysphagia 1 (Puree) solids;Honey thick liquids Liquid  Administration via Cup Medication Administration Crushed with puree Compensations Slow rate;Small sips/bites Postural Changes Seated upright at 90 degrees   CHL IP OTHER RECOMMENDATIONS 07/04/2018 Recommended Consults -- Oral Care Recommendations -- Other Recommendations Have oral suction available   CHL IP FOLLOW UP RECOMMENDATIONS 07/04/2018 Follow up Recommendations Skilled Nursing facility   [facility] IP FREQUENCY AND DURATION 07/04/2018 Speech Therapy Frequency (ACUTE ONLY) min 2x/week Treatment Duration 2 weeks      CHL IP ORAL PHASE 07/04/2018 Oral Phase Impaired Oral - Pudding Teaspoon -- Oral - Pudding Cup -- Oral - Honey Teaspoon -- Oral - Honey Cup Delayed oral transit;Decreased bolus cohesion;Right anterior bolus loss Oral - Nectar Teaspoon Delayed oral transit;Decreased bolus cohesion;Right anterior bolus loss Oral - Nectar Cup Delayed oral transit;Decreased bolus cohesion;Right anterior bolus loss Oral - Nectar Straw -- Oral - Thin Teaspoon -- Oral - Thin Cup -- Oral - Thin Straw -- Oral - Puree Delayed oral transit;Decreased bolus cohesion;Right anterior bolus loss Oral - Mech Soft Delayed oral transit;Decreased bolus cohesion;Right anterior bolus loss Oral - Regular -- Oral - Multi-Consistency -- Oral - Pill -- Oral Phase - Comment --  CHL IP PHARYNGEAL PHASE 07/04/2018 Pharyngeal  Phase Impaired Pharyngeal- Pudding Teaspoon -- Pharyngeal -- Pharyngeal- Pudding Cup -- Pharyngeal -- Pharyngeal- Honey Teaspoon Delayed swallow initiation-pyriform sinuses Pharyngeal -- Pharyngeal- Honey Cup Delayed swallow initiation-pyriform sinuses Pharyngeal -- Pharyngeal- Nectar Teaspoon -- Pharyngeal -- Pharyngeal- Nectar Cup Delayed swallow initiation-pyriform sinuses;Penetration/Aspiration before swallow;Penetration/Aspiration during swallow;Trace aspiration Pharyngeal Material enters airway, passes BELOW cords without attempt by patient to eject out (silent aspiration) Pharyngeal- Nectar Straw -- Pharyngeal -- Pharyngeal-  Thin Teaspoon -- Pharyngeal -- Pharyngeal- Thin Cup -- Pharyngeal -- Pharyngeal- Thin Straw -- Pharyngeal -- Pharyngeal- Puree Delayed swallow initiation-vallecula Pharyngeal -- Pharyngeal- Mechanical Soft NT Pharyngeal -- Pharyngeal- Regular -- Pharyngeal -- Pharyngeal- Multi-consistency -- Pharyngeal -- Pharyngeal- Pill -- Pharyngeal -- Pharyngeal Comment --  No flowsheet data found. DeBlois, Katherene Ponto 07/04/2018, 2:34 PM              Ct Head Code Stroke Wo Contrast  Result Date: 06/30/2018 CLINICAL DATA:  Code stroke. Initial evaluation for acute right-sided gaze. EXAM: CT HEAD WITHOUT CONTRAST TECHNIQUE: Contiguous axial images were obtained from the base of the skull through the vertex without intravenous contrast. COMPARISON:  Prior CT from 11/24/2015. FINDINGS: Brain: Age-related cerebral atrophy with chronic small vessel ischemic disease. Remote lacunar infarct present within the right basal ganglia. Subtle hypodensity seen involving the left insular ribbon, consistent with evolving acute ischemic left MCA territory infarct. No acute intracranial hemorrhage. No mass lesion, midline shift or mass effect. No hydrocephalus. No extra-axial fluid collection. Vascular: No definite hyperdense vessel identified. Scattered vascular calcifications noted within the carotid siphons. Skull: Scalp soft tissues within normal limits. Multiple lucent lesion seen throughout the calvarium, consistent with multiple myeloma. Sinuses/Orbits: Left-sided gaze noted. Paranasal sinuses and mastoid air cells are clear. Other: None. ASPECTS [facility] Stroke Program Early CT Score) - Ganglionic level infarction (caudate, lentiform nuclei, internal capsule, insula, M1-M3 cortex): 6 - Supraganglionic infarction (M4-M6 cortex): 3 Total score (0-10 with 10 being normal): 9 IMPRESSION: 1. Abnormal hypodensity involving the left insular cortex, suspicious for acute left MCA territory infarct. 2. ASPECTS is 9. These results were  communicated to Dr. Rory Percy at 6:25 pmon 9/9/2019by text page via the [facility] messaging system. Electronically Signed   By: Jeannine Boga M.D.   On: 06/30/2018 18:25    TTE - Left ventricle: The cavity size was normal. Wall thickness was normal. Systolic function was normal. The estimated ejection fraction was in the range of 55% to 60%. Wall motion was normal; there were no regional wall motion abnormalities. - Aortic valve: There was trivial regurgitation. - Mitral valve: There was mild regurgitation. - Left atrium: The atrium was severely dilated. - Right atrium: The atrium was mildly to moderately dilated. - Tricuspid valve: There was moderate regurgitation. - Pulmonary arteries: Systolic pressure was mildly increased. PA peak pressure: 33 mm Hg (S).  PHYSICAL EXAM General - Well nourished, well developed, not in acute distress. Cardiovascular - irregularly irregular heart rate and rhythm.  Neuro - eyes open, still global aphasia, not following commands, no language output. Not repeating or naming. Left forced gaze, right neglect, not blinking to visual threat on the right. PERRL, not tracking on the right. RUE 0/5 and RLE mild withdraw on pain stimulation. LUE and LLE spontaneous movement and against gravity. Sensation, coordination and gait not tested.  No change in neuro exam comparing with yesterdsay   ASSESSMENT/PLAN Ruth Sanchez is a 72 y.o. female with history of HTN, MM in remission admitted for left gaze, aphasia and right weakness. No tPA  given due to OSW.    Stroke:  left MCA large infarct due to left M1 occlusion, embolic secondary to new diagnosis of afib  Resultant full blown of left MCA syndrome  CT old right CR infarct  CTA head and neck left M1 M2 bifurcation occlusion  DSA - left M2 occlusion with initial TICI2b reperfusion but then re-occluded. Unsuccessful intervention  MRI confirmed left large MCA infarct  MRA persistent left M1 occlusion  2D  Echo EF 55-60%  LDL 62  HgbA1c 5.3  SCDs for VTE prophylaxis  No antithrombotic prior to admission, now on plavix after po access. ASA allergy noted. Will consider DOACs in 14 days post stroke if neuro stable.   Ongoing aggressive stroke risk factor management  Therapy recommendations:  SNF  Disposition:  Pending. Hope for d/c Monday depending on po tolerance  Afib - new diagnosis  Tele and EKG confirmed afib rhythm  Intermittent RVR, resumed coreg 12.49m bid  Likely the cause of current stroke  Rate under control  May consider DOACs in 14 days post stroke if neuro stable  Dysphagia  cortrak placed for TF  Passed swallow, now diet increased to D1 honey thick  Not eating > 50% meal  Continue TF and IVF for now  Hold PEG for now  Leukocytosis  WBC 8.4->19.8->15.5->16.0->14.3->14.0  UA neg  CXR 07/01/18 no active disease  CXR 07/02/18 no active disease  Afebrile  Close CBC monitoring  Hypertension Stable off cleviprex BP goal < 160 now On home meds with norvasc and coreg  Long term BP goal 130-150 given left M1 persistent occlusion  Other Stroke Risk Factors  Advanced age  Other Active Problems  MM in remission  Hypokalemia - 3.2 -> supplement -> recheck  Hospital day # 6    Personally examined patient and images, and have participated in and made any corrections needed to history, physical, neuro exam,assessment and plan as stated above.  I have personally obtained the history, evaluated lab date, reviewed imaging studies and agree with radiology interpretations.    ASarina Ill MD Stroke Neurology  To contact Stroke Continuity provider, please refer to [email or web address] After hours, contact General Neurology

## 2018-07-07 ENCOUNTER — Encounter (HOSPITAL_COMMUNITY): Payer: Self-pay | Admitting: Interventional Radiology

## 2018-07-07 DIAGNOSIS — I639 Cerebral infarction, unspecified: Secondary | ICD-10-CM

## 2018-07-07 LAB — GLUCOSE, CAPILLARY
GLUCOSE-CAPILLARY: 110 mg/dL — AB (ref 70–99)
GLUCOSE-CAPILLARY: 115 mg/dL — AB (ref 70–99)
GLUCOSE-CAPILLARY: 115 mg/dL — AB (ref 70–99)
Glucose-Capillary: 116 mg/dL — ABNORMAL HIGH (ref 70–99)
Glucose-Capillary: 126 mg/dL — ABNORMAL HIGH (ref 70–99)
Glucose-Capillary: 142 mg/dL — ABNORMAL HIGH (ref 70–99)
Glucose-Capillary: 97 mg/dL (ref 70–99)

## 2018-07-07 LAB — BASIC METABOLIC PANEL
Anion gap: 11 (ref 5–15)
BUN: 15 mg/dL (ref 8–23)
CALCIUM: 9.2 mg/dL (ref 8.9–10.3)
CO2: 22 mmol/L (ref 22–32)
CREATININE: 0.66 mg/dL (ref 0.44–1.00)
Chloride: 111 mmol/L (ref 98–111)
GFR calc non Af Amer: >60 mL/min (ref >60)
GLUCOSE: 128 mg/dL — AB (ref 70–99)
Potassium: 3.9 mmol/L (ref 3.5–5.1)
Sodium: 144 mmol/L (ref 135–145)

## 2018-07-07 MED ORDER — ENSURE ENLIVE PO LIQD
237.0000 mL | Freq: Three times a day (TID) | ORAL | Status: DC
  Administered 2018-07-07 – 2018-07-08 (×4): 237 mL via ORAL

## 2018-07-07 MED ORDER — APIXABAN 5 MG PO TABS
5.0000 mg | ORAL_TABLET | Freq: Two times a day (BID) | ORAL | Status: DC
  Administered 2018-07-07 – 2018-07-08 (×2): 5 mg via ORAL
  Filled 2018-07-07 (×2): qty 1

## 2018-07-07 NOTE — Progress Notes (Signed)
[facility] has offered a bed for patient and can accept patient when cortrak is discontinued. CSW updated patient's son, Demond. CSW to follow and support with discharge planning.  Estanislado Emms, [place]

## 2018-07-07 NOTE — Progress Notes (Addendum)
STROKE TEAM PROGRESS NOTE   SUBJECTIVE (INTERVAL HISTORY) Neuro stable.  Passed swallow study on Friday but remains on tube feeding today.  Will discontinue tube feedings and see her intake tolerance.  OBJECTIVE Temp:  [97.4 F (36.3 C)-98.6 F (37 C)] 97.4 F (36.3 C) (09/16 1230) Pulse Rate:  [83-111] 92 (09/16 1230) Cardiac Rhythm: Atrial fibrillation (09/16 0700) Resp:  [16-18] 18 (09/16 1230) BP: (123-151)/(60-91) 131/73 (09/16 1230) SpO2:  [96 %-100 %] 98 % (09/16 1230) Weight:  [65.9 kg] 65.9 kg (09/16 0500)  Recent Labs  Lab 07/06/18 1927 07/06/18 2346 07/07/18 0418 07/07/18 0743 07/07/18 1148  GLUCAP 129* 110* 115* 115* 126*   Recent Labs  Lab 07/03/18 0222 07/04/18 0541 07/05/18 0724 07/06/18 0650 07/07/18 0517  NA 139 141 141 144 144  K 3.2* 3.5 3.1* 3.2* 3.9  CL 108 113* 111 112* 111  CO2 21* 20* 21* 23 22  GLUCOSE 113* 129* 133* 136* 128*  BUN 9 10 12 13 15   CREATININE 0.69 0.65 0.64 0.62 0.66  CALCIUM 7.9* 8.3* 8.6* 9.0 9.2   Recent Labs  Lab 06/30/18 1812  AST 23  ALT 13  ALKPHOS 107  BILITOT 1.3*  PROT 7.7  ALBUMIN 3.8   Recent Labs  Lab 06/30/18 1812 07/02/18 0347 07/03/18 0222 07/04/18 0541 07/05/18 0724 07/06/18 0650  WBC 8.4 19.8* 15.5* 16.0* 14.3* 14.0*  NEUTROABS 6.2  --   --   --   --   --   HGB 12.5 10.9* 10.8* 11.7* 11.7* 11.5*  HCT 40.3 33.8* 33.8* 36.0 36.6 36.4  MCV 93.5 91.8 91.4 90.7 91.0 91.5  PLT 125* 120* 120* 134* 170 175    PHYSICAL EXAM per Dr. Madison Hickman - Well nourished, well developed, not in acute distress. Cardiovascular - irregularly irregular heart rate and rhythm.  Neuro - eyes open, still global aphasia, not following commands, no language output. Not repeating or naming. Left forced gaze, right neglect, not blinking to visual threat on the right. PERRL, not tracking on the right. RUE 0/5 and RLE mild withdraw on pain stimulation. LUE and LLE spontaneous movement and against gravity. Sensation,  coordination and gait not tested.  No change in neuro exam comparing with yesterdsay   ASSESSMENT/PLAN [name] is a 72 y.o. female with history of HTN, MM in remission admitted for left gaze, aphasia and right weakness. No tPA given due to OSW.    Stroke:  left MCA large infarct due to left M1 occlusion, embolic secondary to new diagnosis of afib  Resultant full blown of left MCA syndrome  CT old right CR infarct  CTA head and neck left M1 M2 bifurcation occlusion  DSA - left M2 occlusion with initial TICI2b reperfusion but then re-occluded. Unsuccessful intervention  MRI confirmed left large MCA infarct  MRA persistent left M1 occlusion  2D Echo EF 55-60%  LDL 62  HgbA1c 5.3  SCDs for VTE prophylaxis  No antithrombotic prior to admission, now on plavix after po access. ASA allergy noted. Will consider DOACs in 14 days post stroke if neuro stable (07/14/2018)  Therapy recommendations:  SNF  Disposition:  Pending. Plan for d/c Tuesday   Afib - new diagnosis  Tele and EKG confirmed afib rhythm  Intermittent RVR, resumed coreg 12.5mg  bid  Likely the cause of current stroke  Rate under control  May consider DOACs in 14 days post stroke if neuro stable  Dysphagia  cortrak placed for TF  Passed swallow, now diet  increased to D1 honey thick  Eating well per ST  D/c coretrak and TF  Leukocytosis  WBC 8.4->19.8->15.5->16.0->14.3->14.0  UA neg  CXR 07/01/18 no active disease  CXR 07/02/18 no active disease  Afebrile  Hypertension Stable off cleviprex BP goal < 160 now On home meds with norvasc and coreg  Long term BP goal 130-150 given left M1 persistent occlusion  Other Stroke Risk Factors  Advanced age  Other Active Problems  MM in remission  Hypokalemia, resolved - 3.9   Hospital day # [place], MSN, APRN, ANVP-BC, AGPCNP-BC Advanced Practice Stroke Nurse [place] for Schedule & Pager  information 07/07/2018 3:31 PM  I have personally examined this patient, reviewed notes, independently viewed imaging studies, participated in medical decision making and plan of care.ROS completed by me personally and pertinent positives fully documented  I have made any additions or clarifications directly to the above note. Agree with note above. Recommend discontinue tube feedings and encourage oral intake. Discuss with dietitian. Greater than 50% diameter in this 25 minute visit was spent on counseling and coordination of care and discussion with care team Antony Contras, MD Medical Director [place] Pager: [phone] 07/07/2018 4:44 PM  To contact Stroke Continuity provider, please refer to [email or web address] After hours, contact General Neurology

## 2018-07-07 NOTE — Progress Notes (Signed)
  Speech Language Pathology Treatment: Dysphagia;Cognitive-Linquistic  Patient Details Name: Ruth Sanchez MRN: [id number] DOB: 12-21-1945 Today's Date: 07/07/2018 Time: 2500-3704 SLP Time Calculation (min) (ACUTE ONLY): 36 min  Assessment / Plan / Recommendation Clinical Impression  Pt tolerating dys 1 diet and honey thick liquids, mostly eating carbs and sweets, no meats. Trialed thinner liquids today, but pt was still requiring max assist to self feed sips from the cup and could not do straw. Decreased automaticity still led to instances of ill-timed swallow with cough response. Hopeful that removal of NG will improve pts comfort with self feeding.  Attempted maximal kinetic interventions (drumming, clapping, throwing) to elicit humming, counting etc. Pt was more attentive, making eye contact and sometimes smiling, phonated a few times as a grunt/response to pain but not in response to interventions. Will continue efforts.   HPI HPI: 72 year old female admitted 06/30/18 with stroke symptoms. PMH: arthritis, asthma, HTN. MRI pending.      SLP Plan  Continue with current plan of care       Recommendations  Diet recommendations: Honey-thick liquid;Dysphagia 1 (puree) Medication Administration: Whole meds with liquid Supervision: Staff to assist with self feeding;Full supervision/cueing for compensatory strategies Compensations: Slow rate;Small sips/bites                Follow up Recommendations: Skilled Nursing facility SLP Visit Diagnosis: Dysphagia, oropharyngeal phase (R13.12) Plan: Continue with current plan of care       GO               [facility], MA CCC-SLP [phone]  Lynann Beaver 07/07/2018, 3:28 PM

## 2018-07-07 NOTE — Progress Notes (Signed)
Nutrition Follow-up  DOCUMENTATION CODES:   Not applicable  INTERVENTION:  Provide Magic cup TID with meals, each supplement provides 290 kcal and 9 grams of protein.  Provide Ensure Enlive po TID (thickened to honey thick consistency), each supplement provides 350 kcal and 20 grams of protein.  Discontinue NGT and tube feedings.   Encourage adequate PO intake.   NUTRITION DIAGNOSIS:   Inadequate oral intake related to acute illness(MCA infarct ) as evidenced by NPO status; diet advanced; improving  GOAL:   Patient will meet greater than or equal to 90% of their needs' met with TF  MONITOR:   PO intake, Supplement acceptance, Diet advancement, Weight trends, Labs, Skin, I & O's  REASON FOR ASSESSMENT:   Malnutrition Screening Tool, Consult Enteral/tube feeding initiation and management  ASSESSMENT:   72 y/o female with h/o Afib, multiple myeloma admitted with large left MCA infarct  Pt continues on a dysphagia 1 diet with honey thick consistency. Meal completion has been varied from 5-75% with 50% intake at lunch today. RN reports intake has improved today. Family at bedside reports pt po intake today is similar to home PO baseline. Received consent via MD to discontinue NGT and tube feeding and to encourage PO intake. RD to order nutritional supplements to aid in maximizing PO intake. Family at bedside has been encouraging PO intake. Labs and medications reviewed.   Diet Order:   Diet Order            DIET - DYS 1 Room service appropriate? Yes; Fluid consistency: Honey Thick  Diet effective now              EDUCATION NEEDS:   Not appropriate for education at this time  Skin:  Skin Assessment: Reviewed RN Assessment  Last BM:  9/15  Height:   Ht Readings from Last 1 Encounters:  06/30/18 5' (1.524 m)    Weight:   Wt Readings from Last 1 Encounters:  07/07/18 65.9 kg    Ideal Body Weight:  45.45 kg  BMI:  Body mass index is 28.37  kg/m.  Estimated Nutritional Needs:   Kcal:  1500-1700  Protein:  75-85 grams  Fluid:  >/= 1.5 L/day    Corrin Parker, MS, RD, LDN Pager # [phone] After hours/ weekend pager # [phone]

## 2018-07-07 NOTE — Consult Note (Signed)
[facility] CM Primary Care Navigator  07/07/2018  Ruth Sanchez 1946/02/14 [id number]   Met withpatientand daughter in-law Pharmacist, hospital) and son (Demond) at the bedside to identify possible discharge needs. Per family, patientwas found unresponsive on the couch on her right side thathad ledto this admission. (Stroke- embolic secondary to new diagnosis of a-fib)  Son endorses Dr. [name] with [facility] astheprimary care provider for patient.   Patient's family report using [facility] on [address] obtain medications withoutdifficulty.  Patient's son and daughter in-law had taken over managing patient's medications at home with use of "pill box" system filled every week.  Patient's son and daughter in-law have beenproviding transportation to herdoctors'appointments.  According to son, patient had lived with them (son and daughter in-law) for about a year and have been her primary caregivers at home.  Anticipated discharge planisskilled nursing facility (SNF) per therapy recommendation. Patient's son and daughter in-lawmentionedthat patient will de discharged to a nursing home with plan for a long term care placement since she is nowrequiring higher level of care.  No further health management needs identified at this point.  For additional questions please contact:  Edwena Felty A. Zela Sobieski, BSN, RN-BC [facility] PRIMARY CARE Navigator Cell: [phone]

## 2018-07-07 NOTE — Progress Notes (Signed)
ANTICOAGULATION CONSULT NOTE - Initial Consult  Pharmacy Consult for Eliquis Indication: atrial fibrillation and stroke  Allergies  Allergen Reactions  . Gabapentin Swelling and Other (See Comments)    Reaction:  Lip swelling  . Aspirin Itching, Swelling and Rash    Lips swell, itching    Patient Measurements: Height: 5' (152.4 cm) Weight: 145 lb 4.5 oz (65.9 kg) IBW/kg (Calculated) : 45.5  Vital Signs: Temp: 97.4 F (36.3 C) (09/16 1230) Temp Source: Oral (09/16 1230) BP: 131/73 (09/16 1230) Pulse Rate: 92 (09/16 1230)  Labs: Recent Labs    07/05/18 0724 07/06/18 0650 07/07/18 0517  HGB 11.7* 11.5*  --   HCT 36.6 36.4  --   PLT 170 175  --   CREATININE 0.64 0.62 0.66    Estimated Creatinine Clearance: 53.1 mL/min (by C-G formula based on SCr of 0.66 mg/dL).   Medical History: Past Medical History:  Diagnosis Date  . Arthritis   . Asthma   . Cancer ([place])   . Hypertension    Assessment:  72 yr old female admitted on 06/30/18 with stroke and new atrial fibrillation. S/p unsuccessful revascularization on 06/30/18; petechial hemorraghic conversion noted on 07/01/18.  ASA allergy. SQ heparin begun on 9/10, then Plavix begun on 9/12.  SQ heparin and Plavix stopped today and to begin Eliquis. Last Plavix and SQ heparin doses given this morning. Neuro note indicates plan to consider DOAC 14 days after stroke (9/23). Discussed briefly with Dr. Leonie Man, who would like Eliquis to begin tonight.  Goal of Therapy:  appropriate Eliquis dose for indication Monitor platelets by anticoagulation protocol: Yes   Plan:   Eliquis 5 mg PO BID to begin tonight.  Monitor for any s/sx bleeding.  Intermittent CBC.  Arty Baumgartner, [place] Pager: [phone] or phone: [phone] 07/07/2018,4:41 PM

## 2018-07-07 NOTE — Progress Notes (Signed)
Physical Therapy Treatment Patient Details Name: Ruth Sanchez MRN: [id number] DOB: [birth year] Today's Date: 07/07/2018    History of Present Illness Pt is a 72 y.o. female with PMH significant for HTN, arthritis, asthma, and cancer who presented with L gaze, aphasia, and R sided weakness. Pt is s/p proximal inferior MCA revascularization in IR 06/30/18.     PT Comments    Patient more alert today. Continues to demonstrate left gaze preference and right hemiplegia. Worked on sitting balance, WB through RUE and activating lateral trunk flexors. Tolerated squat pivot transfer towards left side with MAx A of 2; able to initiate forward trunk lean. Requires gestural cues to perform tasks. Will follow.   Follow Up Recommendations  SNF     Equipment Recommendations  None recommended by PT    Recommendations for Other Services       Precautions / Restrictions Precautions Precautions: Fall;Other (comment) Precaution Comments: R sided weakness; L gaze; NG tube Restrictions Weight Bearing Restrictions: No    Mobility  Bed Mobility Overal bed mobility: Needs Assistance Bed Mobility: Rolling;Sidelying to Sit Rolling: Max assist;+2 for physical assistance Sidelying to sit: Max assist;+2 for physical assistance       General bed mobility comments: Initiation to roll towards right but assist required for BLEs and to elevate trunk to get to EOB.  Transfers Overall transfer level: Needs assistance Equipment used: 2 person hand held assist Transfers: Set designer Transfers;Sit to/from Stand Sit to Stand: Max assist;+2 physical assistance   Squat pivot transfers: Max assist;+2 physical assistance     General transfer comment: Squat pivot transfer towards left side; able to stand for pericare with PT supporting left knee. Able to initiate forward lean.  Ambulation/Gait             General Gait Details: not able   Stairs             Wheelchair Mobility    Modified  Rankin (Stroke Patients Only) Modified Rankin (Stroke Patients Only) Pre-Morbid Rankin Score: No symptoms Modified Rankin: Severe disability     Balance Overall balance assessment: Needs assistance Sitting-balance support: Feet supported;Single extremity supported Sitting balance-Leahy Scale: Poor Sitting balance - Comments: Able to sit EOB with heavy right lean, can use LUE for support on bed rail. Worked on WB through right elbow and activating lateral trunk flexors with Mod-Max A. Postural control: Right lateral lean   Standing balance-Leahy Scale: Zero Standing balance comment: Relies on assistance from staff.                             Cognition Arousal/Alertness: Awake/alert Behavior During Therapy: Flat affect Overall Cognitive Status: Difficult to assess                                 General Comments: Smiling appropriately x2 during session. Able to follow gestural command for get to EOB and initiate movement. Left gaze preference but able to get to midline/right side with manual cues       Exercises      General Comments General comments (skin integrity, edema, etc.): Daughter in law present during session.      Pertinent Vitals/Pain Pain Assessment: Faces Faces Pain Scale: No hurt    Home Living                      Prior Function  PT Goals (current goals can now be found in the care plan section) Progress towards PT goals: Progressing toward goals    Frequency    Min 3X/week      PT Plan Current plan remains appropriate    Co-evaluation              AM-PAC PT "6 Clicks" Daily Activity  Outcome Measure  Difficulty turning over in bed (including adjusting bedclothes, sheets and blankets)?: Unable Difficulty moving from lying on back to sitting on the side of the bed? : Unable Difficulty sitting down on and standing up from a chair with arms (e.g., wheelchair, bedside commode, etc,.)?:  Unable Help needed moving to and from a bed to chair (including a wheelchair)?: Total Help needed walking in hospital room?: Total Help needed climbing 3-5 steps with a railing? : Total 6 Click Score: 6    End of Session Equipment Utilized During Treatment: Gait belt Activity Tolerance: Patient tolerated treatment well Patient left: in chair;with call bell/phone within reach;with chair alarm set;with family/visitor present Nurse Communication: Need for lift equipment;Mobility status PT Visit Diagnosis: Muscle weakness (generalized) (M62.81);Hemiplegia and hemiparesis Hemiplegia - Right/Left: Right Hemiplegia - dominant/non-dominant: Dominant Hemiplegia - caused by: Cerebral infarction     Time: 1406-1430 PT Time Calculation (min) (ACUTE ONLY): 24 min  Charges:  $Therapeutic Activity: 8-22 mins $Neuromuscular Re-education: 8-22 mins                     Wray Kearns, PT, DPT Acute Rehabilitation Services Pager [phone] Office [phone]       Marguarite Arbour A Sabra Heck 07/07/2018, 2:39 PM

## 2018-07-08 DIAGNOSIS — Y999 Unspecified external cause status: Secondary | ICD-10-CM | POA: Diagnosis not present

## 2018-07-08 DIAGNOSIS — I639 Cerebral infarction, unspecified: Secondary | ICD-10-CM | POA: Diagnosis not present

## 2018-07-08 DIAGNOSIS — M79641 Pain in right hand: Secondary | ICD-10-CM | POA: Diagnosis not present

## 2018-07-08 DIAGNOSIS — I4891 Unspecified atrial fibrillation: Secondary | ICD-10-CM | POA: Diagnosis not present

## 2018-07-08 DIAGNOSIS — S01112A Laceration without foreign body of left eyelid and periocular area, initial encounter: Secondary | ICD-10-CM | POA: Diagnosis not present

## 2018-07-08 DIAGNOSIS — E876 Hypokalemia: Secondary | ICD-10-CM | POA: Diagnosis not present

## 2018-07-08 DIAGNOSIS — R1312 Dysphagia, oropharyngeal phase: Secondary | ICD-10-CM | POA: Diagnosis not present

## 2018-07-08 DIAGNOSIS — M5489 Other dorsalgia: Secondary | ICD-10-CM | POA: Diagnosis not present

## 2018-07-08 DIAGNOSIS — W19XXXA Unspecified fall, initial encounter: Secondary | ICD-10-CM | POA: Diagnosis not present

## 2018-07-08 DIAGNOSIS — M6281 Muscle weakness (generalized): Secondary | ICD-10-CM | POA: Diagnosis not present

## 2018-07-08 DIAGNOSIS — W050XXA Fall from non-moving wheelchair, initial encounter: Secondary | ICD-10-CM | POA: Diagnosis not present

## 2018-07-08 DIAGNOSIS — I69391 Dysphagia following cerebral infarction: Secondary | ICD-10-CM

## 2018-07-08 DIAGNOSIS — R419 Unspecified symptoms and signs involving cognitive functions and awareness: Secondary | ICD-10-CM | POA: Diagnosis not present

## 2018-07-08 DIAGNOSIS — I6930 Unspecified sequelae of cerebral infarction: Secondary | ICD-10-CM | POA: Diagnosis not present

## 2018-07-08 DIAGNOSIS — Y92129 Unspecified place in nursing home as the place of occurrence of the external cause: Secondary | ICD-10-CM | POA: Diagnosis not present

## 2018-07-08 DIAGNOSIS — Y9389 Activity, other specified: Secondary | ICD-10-CM | POA: Diagnosis not present

## 2018-07-08 DIAGNOSIS — S01111A Laceration without foreign body of right eyelid and periocular area, initial encounter: Secondary | ICD-10-CM | POA: Diagnosis not present

## 2018-07-08 DIAGNOSIS — C9001 Multiple myeloma in remission: Secondary | ICD-10-CM | POA: Diagnosis not present

## 2018-07-08 DIAGNOSIS — J45909 Unspecified asthma, uncomplicated: Secondary | ICD-10-CM | POA: Diagnosis not present

## 2018-07-08 DIAGNOSIS — G8191 Hemiplegia, unspecified affecting right dominant side: Secondary | ICD-10-CM | POA: Diagnosis not present

## 2018-07-08 DIAGNOSIS — I69351 Hemiplegia and hemiparesis following cerebral infarction affecting right dominant side: Secondary | ICD-10-CM | POA: Diagnosis not present

## 2018-07-08 DIAGNOSIS — M2550 Pain in unspecified joint: Secondary | ICD-10-CM | POA: Diagnosis not present

## 2018-07-08 DIAGNOSIS — D72829 Elevated white blood cell count, unspecified: Secondary | ICD-10-CM

## 2018-07-08 DIAGNOSIS — I480 Paroxysmal atrial fibrillation: Secondary | ICD-10-CM | POA: Diagnosis not present

## 2018-07-08 DIAGNOSIS — I10 Essential (primary) hypertension: Secondary | ICD-10-CM | POA: Diagnosis not present

## 2018-07-08 DIAGNOSIS — I69320 Aphasia following cerebral infarction: Secondary | ICD-10-CM | POA: Diagnosis not present

## 2018-07-08 DIAGNOSIS — S0990XA Unspecified injury of head, initial encounter: Secondary | ICD-10-CM | POA: Diagnosis not present

## 2018-07-08 DIAGNOSIS — Z79899 Other long term (current) drug therapy: Secondary | ICD-10-CM | POA: Diagnosis not present

## 2018-07-08 DIAGNOSIS — Z859 Personal history of malignant neoplasm, unspecified: Secondary | ICD-10-CM | POA: Diagnosis not present

## 2018-07-08 DIAGNOSIS — Z8673 Personal history of transient ischemic attack (TIA), and cerebral infarction without residual deficits: Secondary | ICD-10-CM | POA: Diagnosis not present

## 2018-07-08 DIAGNOSIS — S6991XA Unspecified injury of right wrist, hand and finger(s), initial encounter: Secondary | ICD-10-CM | POA: Diagnosis not present

## 2018-07-08 DIAGNOSIS — S098XXA Other specified injuries of head, initial encounter: Secondary | ICD-10-CM | POA: Diagnosis present

## 2018-07-08 DIAGNOSIS — Z7401 Bed confinement status: Secondary | ICD-10-CM | POA: Diagnosis not present

## 2018-07-08 DIAGNOSIS — R404 Transient alteration of awareness: Secondary | ICD-10-CM | POA: Diagnosis not present

## 2018-07-08 DIAGNOSIS — F331 Major depressive disorder, recurrent, moderate: Secondary | ICD-10-CM | POA: Diagnosis not present

## 2018-07-08 DIAGNOSIS — S31109A Unspecified open wound of abdominal wall, unspecified quadrant without penetration into peritoneal cavity, initial encounter: Secondary | ICD-10-CM | POA: Diagnosis not present

## 2018-07-08 DIAGNOSIS — Z23 Encounter for immunization: Secondary | ICD-10-CM | POA: Diagnosis not present

## 2018-07-08 DIAGNOSIS — Z7901 Long term (current) use of anticoagulants: Secondary | ICD-10-CM | POA: Diagnosis not present

## 2018-07-08 DIAGNOSIS — I482 Chronic atrial fibrillation: Secondary | ICD-10-CM | POA: Diagnosis not present

## 2018-07-08 DIAGNOSIS — R4182 Altered mental status, unspecified: Secondary | ICD-10-CM | POA: Diagnosis not present

## 2018-07-08 DIAGNOSIS — M25511 Pain in right shoulder: Secondary | ICD-10-CM | POA: Diagnosis not present

## 2018-07-08 DIAGNOSIS — C9000 Multiple myeloma not having achieved remission: Secondary | ICD-10-CM | POA: Diagnosis not present

## 2018-07-08 DIAGNOSIS — Z789 Other specified health status: Secondary | ICD-10-CM | POA: Diagnosis not present

## 2018-07-08 LAB — GLUCOSE, CAPILLARY
GLUCOSE-CAPILLARY: 105 mg/dL — AB (ref 70–99)
GLUCOSE-CAPILLARY: 97 mg/dL (ref 70–99)
Glucose-Capillary: 127 mg/dL — ABNORMAL HIGH (ref 70–99)

## 2018-07-08 MED ORDER — ENSURE ENLIVE PO LIQD: 237.0000 mL | Freq: Three times a day (TID) | ORAL | 12 refills | Status: AC

## 2018-07-08 MED ORDER — SENNOSIDES-DOCUSATE SODIUM 8.6-50 MG PO TABS: 1.0000 | ORAL_TABLET | Freq: Every evening | ORAL | Status: AC | PRN

## 2018-07-08 MED ORDER — APIXABAN 5 MG PO TABS: 5.0000 mg | ORAL_TABLET | Freq: Two times a day (BID) | ORAL | Status: AC

## 2018-07-08 MED ORDER — AMLODIPINE BESYLATE 5 MG PO TABS: 5.0000 mg | ORAL_TABLET | Freq: Every day | ORAL | Status: AC

## 2018-07-08 MED ORDER — RESOURCE THICKENUP CLEAR PO POWD: 1.0000 | ORAL | Status: AC | PRN

## 2018-07-08 MED ORDER — CARVEDILOL 12.5 MG PO TABS: 12.5000 mg | ORAL_TABLET | Freq: Two times a day (BID) | ORAL | Status: AC

## 2018-07-08 MED ORDER — COLLAGENASE 250 UNIT/GM EX OINT
TOPICAL_OINTMENT | Freq: Every day | CUTANEOUS | Status: DC
  Administered 2018-07-08: 16:00:00 via TOPICAL
  Filled 2018-07-08: qty 30

## 2018-07-08 MED ORDER — COLLAGENASE 250 UNIT/GM EX OINT: TOPICAL_OINTMENT | Freq: Every day | CUTANEOUS | 0 refills | Status: AC

## 2018-07-08 NOTE — Discharge Summary (Addendum)
Stroke Discharge Summary  Patient ID: Ruth Sanchez   MRN: [id number]      DOB: [birth year]  Date of Admission: 06/30/2018 Date of Discharge: 07/08/2018  Attending Physician:  Garvin Fila, MD, Stroke MD Consultant(s):     Olene Craven) Estanislado Pandy, MD (Interventional Neuroradiologist), wound ostomy continence nurse Patient's PCP:  [name], MD  DISCHARGE DIAGNOSIS:  Principal Problem:   Acute ischemic stroke (HCC)left MCA large infarct due to left M1 occlusion, embolic secondary to new diagnosis of afib- s/p attempted mechanical thrombectomy Active Problems:   Multiple myeloma in remission ([place])   Paroxysmal atrial fibrillation (HCC)   Hypokalemia   Essential hypertension   Middle cerebral artery embolism, left   Right groin wound   Dysphagia due to recent cerebrovascular accident   Leukocytosis   Past Medical History:  Diagnosis Date  . Arthritis   . Asthma   . Cancer ([place])   . Hypertension    Past Surgical History:  Procedure Laterality Date  . APPENDECTOMY    . CHOLECYSTECTOMY    . ESOPHAGOGASTRODUODENOSCOPY N/A 11/24/2015   Procedure: ESOPHAGOGASTRODUODENOSCOPY (EGD);  Surgeon: Clarene Essex, MD;  Location: [facility];  Service: Endoscopy;  Laterality: N/A;  . IR CT HEAD LTD  06/30/2018  . IR PERCUTANEOUS ART THROMBECTOMY/INFUSION INTRACRANIAL INC DIAG ANGIO  06/30/2018  . RADIOLOGY WITH ANESTHESIA N/A 06/30/2018   Procedure: IR WITH ANESTHESIA;  Surgeon: Luanne Bras, MD;  Location: [facility];  Service: Radiology;  Laterality: N/A;  . TUBAL LIGATION      Allergies as of 07/08/2018      Reactions   Gabapentin Swelling, Other (See Comments)   Reaction:  Lip swelling   Aspirin Itching, Swelling, Rash   Lips swell, itching      Medication List    STOP taking these medications   diphenoxylate-atropine 2.5-0.025 MG tablet Commonly known as:  LOMOTIL   losartan 100 MG tablet Commonly known as:  COZAAR   mometasone-formoterol 100-5 MCG/ACT Aero Commonly  known as:  DULERA   oxybutynin 5 MG tablet Commonly known as:  DITROPAN   tiZANidine 4 MG tablet Commonly known as:  ZANAFLEX   traMADol 50 MG tablet Commonly known as:  ULTRAM     TAKE these medications   albuterol 108 (90 Base) MCG/ACT inhaler Commonly known as:  PROVENTIL HFA;VENTOLIN HFA Inhale 2 puffs into the lungs every 4 (four) hours as needed for wheezing or shortness of breath.   amLODipine 5 MG tablet Commonly known as:  NORVASC Place 1 tablet (5 mg total) into feeding tube daily. Start taking on:  07/09/2018 What changed:    medication strength  how much to take  how to take this   apixaban 5 MG Tabs tablet Commonly known as:  ELIQUIS Take 1 tablet (5 mg total) by mouth 2 (two) times daily.   carvedilol 12.5 MG tablet Commonly known as:  COREG Place 1 tablet (12.5 mg total) into feeding tube 2 (two) times daily with a meal. What changed:  how to take this   cetirizine 10 MG tablet Commonly known as:  ZYRTEC Take 1 tablet (10 mg total) by mouth daily.   collagenase ointment Commonly known as:  SANTYL Apply topically daily.   feeding supplement (ENSURE ENLIVE) Liqd Take 237 mLs by mouth 3 (three) times daily between meals.   RESOURCE THICKENUP CLEAR Powd Take 120 g by mouth as needed (honey thick liquids).   senna-docusate 8.6-50 MG tablet Commonly known as:  Senokot-S Take  1 tablet by mouth at bedtime as needed for mild constipation.       LABORATORY STUDIES CBC:  Recent Labs  Lab 07/05/18 0724 07/06/18 0650  WBC 14.3* 14.0*  HGB 11.7* 11.5*  HCT 36.6 36.4  MCV 91.0 91.5  PLT 170 175   CMP    Component Value Date/Time   NA 144 07/07/2018 0517   NA 146 (H) 09/27/2017 1528   NA 143 05/28/2017 1141   K 3.9 07/07/2018 0517   K 3.5 05/28/2017 1141   CL 111 07/07/2018 0517   CL 104 01/19/2013 1058   CO2 22 07/07/2018 0517   CO2 28 05/28/2017 1141   GLUCOSE 128 (H) 07/07/2018 0517   GLUCOSE 133 05/28/2017 1141   GLUCOSE 81  01/19/2013 1058   BUN 15 07/07/2018 0517   BUN 15 09/27/2017 1528   BUN 10.4 05/28/2017 1141   CREATININE 0.66 07/07/2018 0517   CREATININE 0.78 04/08/2018 1354   CREATININE 0.8 05/28/2017 1141   CALCIUM 9.2 07/07/2018 0517   CALCIUM 9.7 05/28/2017 1141   PROT 7.7 06/30/2018 1812   PROT 7.4 09/27/2017 1528   PROT 7.6 05/28/2017 1141   ALBUMIN 3.8 06/30/2018 1812   ALBUMIN 4.2 09/27/2017 1528   ALBUMIN 3.6 05/28/2017 1141   AST 23 06/30/2018 1812   AST 19 04/08/2018 1354   AST 18 05/28/2017 1141   ALT 13 06/30/2018 1812   ALT 9 04/08/2018 1354   ALT 14 05/28/2017 1141   ALKPHOS 107 06/30/2018 1812   ALKPHOS 122 05/28/2017 1141   BILITOT 1.3 (H) 06/30/2018 1812   BILITOT 1.4 (H) 04/08/2018 1354   BILITOT 0.86 05/28/2017 1141   GFRNONAA >60 07/07/2018 0517   GFRNONAA >60 04/08/2018 1354   GFRNONAA 85 12/31/2016 0911   GFRAA >60 07/07/2018 0517   GFRAA >60 04/08/2018 1354   GFRAA >89 12/31/2016 0911   COAGS Lab Results  Component Value Date   INR 1.15 06/30/2018   INR 1.09 11/24/2015   Lipid Panel    Component Value Date/Time   CHOL 134 07/01/2018 0503   TRIG 74 07/01/2018 0503   HDL 57 07/01/2018 0503   CHOLHDL 2.4 07/01/2018 0503   VLDL 15 07/01/2018 0503   LDLCALC 62 07/01/2018 0503   HgbA1C  Lab Results  Component Value Date   HGBA1C 5.3 07/01/2018   Urinalysis    Component Value Date/Time   COLORURINE STRAW (A) 07/02/2018 1323   APPEARANCEUR CLEAR 07/02/2018 1323   LABSPEC 1.008 07/02/2018 1323   PHURINE 8.0 07/02/2018 1323   GLUCOSEU NEGATIVE 07/02/2018 1323   HGBUR SMALL (A) 07/02/2018 1323   BILIRUBINUR NEGATIVE 07/02/2018 1323   KETONESUR NEGATIVE 07/02/2018 1323   PROTEINUR NEGATIVE 07/02/2018 1323   UROBILINOGEN 0.2 08/15/2015 1737   NITRITE NEGATIVE 07/02/2018 1323   LEUKOCYTESUR NEGATIVE 07/02/2018 1323     SIGNIFICANT DIAGNOSTIC STUDIES Ct Angio Head W Or Wo Contrast  Result Date: 06/30/2018 CLINICAL DATA:  Initial evaluation for  acute stroke. EXAM: CT ANGIOGRAPHY HEAD AND NECK CT PERFUSION BRAIN TECHNIQUE: Multidetector CT imaging of the head and neck was performed using the standard protocol during bolus administration of intravenous contrast. Multiplanar CT image reconstructions and MIPs were obtained to evaluate the vascular anatomy. Carotid stenosis measurements (when applicable) are obtained utilizing NASCET criteria, using the distal internal carotid diameter as the denominator. Multiphase CT imaging of the brain was performed following IV bolus contrast injection. Subsequent parametric perfusion maps were calculated using RAPID software. CONTRAST:  193m ISOVUE-370 IOPAMIDOL (  ISOVUE-370) INJECTION 76% COMPARISON:  Prior noncontrast CT from earlier the same day. FINDINGS: CTA NECK FINDINGS Aortic arch: Visualized aortic arch of normal caliber with normal branch pattern. No hemodynamically significant stenosis about the origin of the great vessels. Visualized subclavian arteries widely patent. Right carotid system: Right common carotid artery tortuous proximally but widely patent to the bifurcation. Mild atheromatous plaque about the proximal right ICA without stenosis. Right ICA widely patent distally to the skull base without stenosis, dissection, or occlusion Left carotid system: Left common carotid artery tortuous proximally but widely patent to the bifurcation without stenosis. Mild plaque about the left bifurcation without hemodynamically significant stenosis. Left ICA patent distally to the skull base without stenosis, dissection, or occlusion. Vertebral arteries: Both of the vertebral arteries arise from the subclavian arteries. Vertebral arteries widely patent within the neck without stenosis, dissection, or occlusion. Skeleton: Innumerable lytic lesions seen diffusely throughout the visualized spine, compatible with history of multiple myeloma. No acute osseous abnormality. Other neck: Soft tissues of the neck demonstrate no  acute finding. Enlarged goiter is change of the left lobe of thyroid with substernal extension. Upper chest: Visualized upper chest within normal limits. Partially visualized lungs are clear. Review of the MIP images confirms the above findings CTA HEAD FINDINGS Anterior circulation: Internal carotid arteries patent to the termini without hemodynamically significant stenosis. A1 segments patent. Anterior communicating artery normal. Anterior cerebral arteries patent to their distal aspects. Left M1 patent proximally. There is prepped occlusion of the distal left M1/proximal M2 at the left MCA bifurcation (series 9, image 96). Attenuated and reduced flow seen distally within left MCA branches. Right M1 patent. Normal right MCA bifurcation. Distal right MCA branches well perfused. Posterior circulation: Vertebral arteries diminutive but patent to the vertebrobasilar junction without stenosis. Left PICA patent. Right PICA not well visualized. Basilar diminutive but patent to its distal aspect without stenosis. Superior cerebral arteries patent proximally. Fetal type origin of the left PCA. Hypoplastic right P1 with robust right posterior communicating artery. PCAs patent to their distal aspects. Venous sinuses: Grossly patent, although not well assessed due to timing of the contrast bolus. Anatomic variants: Predominant fetal type origin of the PCAs. 4 mm focal outpouching arising from the right MCA bifurcation, suspicious for aneurysm. Delayed phase: Not performed. Review of the MIP images confirms the above findings CT Brain Perfusion Findings: CBF (<30%) Volume: 32m Perfusion (Tmax>6.0s) volume: 947mMismatch Volume: 696mnfarction Location:Anterior left MCA distribution, involving the left insular region and operculum. IMPRESSION: 1. Positive CTA with EVLO of distal left M1/proximal M2 branch at the base of the sylvian fissure. Acute left MCA territory infarct with surrounding penumbra as above. 2. Additional  mild atheromatous change throughout the major arterial vasculature of the head and neck as above. No other hemodynamically significant or correctable identified. 3. 4 mm right MCA bifurcation aneurysm. 4. Innumerable lucent lesions throughout the visualized skeleton, compatible with multiple myeloma. Findings discussed with Dr. AroRory Percy approximately 5:45 p.m. on 06/30/2018. Electronically Signed   By: BenJeannine BogaD.   On: 06/30/2018 19:21   Dg Chest 2 View  Result Date: 07/01/2018 CLINICAL DATA:  Acute ischemic stroke. EXAM: CHEST - 2 VIEW COMPARISON:  12/10/2016 FINDINGS: Patient is rotated to the right. Both lungs are clear. No evidence of pneumothorax or pleural effusion. IMPRESSION: No active lung disease. Electronically Signed   By: JohEarle GellD.   On: 07/01/2018 19:38   Ct Angio Neck W Or Wo Contrast  Result Date: 06/30/2018 CLINICAL  DATA:  Initial evaluation for acute stroke. EXAM: CT ANGIOGRAPHY HEAD AND NECK CT PERFUSION BRAIN TECHNIQUE: Multidetector CT imaging of the head and neck was performed using the standard protocol during bolus administration of intravenous contrast. Multiplanar CT image reconstructions and MIPs were obtained to evaluate the vascular anatomy. Carotid stenosis measurements (when applicable) are obtained utilizing NASCET criteria, using the distal internal carotid diameter as the denominator. Multiphase CT imaging of the brain was performed following IV bolus contrast injection. Subsequent parametric perfusion maps were calculated using RAPID software. CONTRAST:  145m ISOVUE-370 IOPAMIDOL (ISOVUE-370) INJECTION 76% COMPARISON:  Prior noncontrast CT from earlier the same day. FINDINGS: CTA NECK FINDINGS Aortic arch: Visualized aortic arch of normal caliber with normal branch pattern. No hemodynamically significant stenosis about the origin of the great vessels. Visualized subclavian arteries widely patent. Right carotid system: Right common carotid artery  tortuous proximally but widely patent to the bifurcation. Mild atheromatous plaque about the proximal right ICA without stenosis. Right ICA widely patent distally to the skull base without stenosis, dissection, or occlusion Left carotid system: Left common carotid artery tortuous proximally but widely patent to the bifurcation without stenosis. Mild plaque about the left bifurcation without hemodynamically significant stenosis. Left ICA patent distally to the skull base without stenosis, dissection, or occlusion. Vertebral arteries: Both of the vertebral arteries arise from the subclavian arteries. Vertebral arteries widely patent within the neck without stenosis, dissection, or occlusion. Skeleton: Innumerable lytic lesions seen diffusely throughout the visualized spine, compatible with history of multiple myeloma. No acute osseous abnormality. Other neck: Soft tissues of the neck demonstrate no acute finding. Enlarged goiter is change of the left lobe of thyroid with substernal extension. Upper chest: Visualized upper chest within normal limits. Partially visualized lungs are clear. Review of the MIP images confirms the above findings CTA HEAD FINDINGS Anterior circulation: Internal carotid arteries patent to the termini without hemodynamically significant stenosis. A1 segments patent. Anterior communicating artery normal. Anterior cerebral arteries patent to their distal aspects. Left M1 patent proximally. There is prepped occlusion of the distal left M1/proximal M2 at the left MCA bifurcation (series 9, image 96). Attenuated and reduced flow seen distally within left MCA branches. Right M1 patent. Normal right MCA bifurcation. Distal right MCA branches well perfused. Posterior circulation: Vertebral arteries diminutive but patent to the vertebrobasilar junction without stenosis. Left PICA patent. Right PICA not well visualized. Basilar diminutive but patent to its distal aspect without stenosis. Superior  cerebral arteries patent proximally. Fetal type origin of the left PCA. Hypoplastic right P1 with robust right posterior communicating artery. PCAs patent to their distal aspects. Venous sinuses: Grossly patent, although not well assessed due to timing of the contrast bolus. Anatomic variants: Predominant fetal type origin of the PCAs. 4 mm focal outpouching arising from the right MCA bifurcation, suspicious for aneurysm. Delayed phase: Not performed. Review of the MIP images confirms the above findings CT Brain Perfusion Findings: CBF (<30%) Volume: 266mPerfusion (Tmax>6.0s) volume: 9831mismatch Volume: 82m16mfarction Location:Anterior left MCA distribution, involving the left insular region and operculum. IMPRESSION: 1. Positive CTA with EVLO of distal left M1/proximal M2 branch at the base of the sylvian fissure. Acute left MCA territory infarct with surrounding penumbra as above. 2. Additional mild atheromatous change throughout the major arterial vasculature of the head and neck as above. No other hemodynamically significant or correctable identified. 3. 4 mm right MCA bifurcation aneurysm. 4. Innumerable lucent lesions throughout the visualized skeleton, compatible with multiple myeloma. Findings discussed with Dr.  Arora at approximately 5:45 p.m. on 06/30/2018. Electronically Signed   By: Jeannine Boga M.D.   On: 06/30/2018 19:21   Mr Jodene Nam Head Wo Contrast  Result Date: 07/01/2018 CLINICAL DATA:  Follow-up examination for acute stroke. EXAM: MRI HEAD WITHOUT CONTRAST MRA HEAD WITHOUT CONTRAST TECHNIQUE: Multiplanar, multiecho pulse sequences of the brain and surrounding structures were obtained without intravenous contrast. Angiographic images of the head were obtained using MRA technique without contrast. COMPARISON:  Prior CTA perfusion from 06/30/2018. FINDINGS: MRI HEAD FINDINGS Brain: Examination severely degraded by motion artifact. Large confluent area of restricted diffusion seen  involving the majority of the left MCA territory, with involvement of the left frontal, parietal, and temporal lobes, consistent with acute left MCA territory infarct. Associated localized gyral swelling and edema without significant regional mass effect at this time. Evidence for petechial hemorrhage superior to the left insula noted (series 10, image 69). No frank hemorrhagic transformation. Additional susceptibility artifact within the left sylvian fissure favored to be related to persistent intravascular thrombus. Remote right basal ganglia lacunar infarct with associated chronic hemosiderin staining noted. Additional chronic hemosiderin staining noted at the right parietal lobe as well. Underlying age-related cerebral atrophy with mild chronic small vessel ischemic disease. No mass lesion or midline shift. No hydrocephalus. No extra-axial fluid collection. Pituitary gland normal. Vascular: Susceptibility artifact within the left sylvian fissure, most likely intraluminal arterial thrombus within left MCA branches. Major intracranial vascular flow voids otherwise maintained at the skull base. Skull and upper cervical spine: Craniocervical junction normal. Diffuse osseous changes related to multiple myeloma noted. No scalp soft tissue abnormality. Sinuses/Orbits: Globes and orbital soft tissues within normal limits. Paranasal sinuses are largely clear. No mastoid effusion. Inner ear structures grossly normal. Other: None. MRA HEAD FINDINGS ANTERIOR CIRCULATION: Examination severely degraded by motion artifact. Distal cervical segments of the internal carotid arteries are patent with antegrade flow. Petrous, cavernous, and supraclinoid segments patent without hemodynamically significant stenosis. A1 segments, anterior communicating artery, and anterior cerebral arteries grossly patent without stenosis. Proximal and mid left M1 patent. There is persistent occlusion of distal left M1/proximal M2 at the base of the  sylvian fissure (series 4, image 60). Persistent flow within the anterior temporal branch. No other significant flow within the left MCA territory. Right M1 widely patent without stenosis. No proximal right M2 occlusion. Distal right MCA branches grossly perfused. POSTERIOR CIRCULATION: Vertebral arteries grossly patent to the vertebrobasilar junction without stenosis. Posterior inferior cerebral arteries patent bilaterally. Basilar patent to its distal aspect without obvious stenosis. Superior cerebral arteries patent bilaterally. PCAs grossly patent to their distal aspects without obvious stenosis. IMPRESSION: MRI HEAD IMPRESSION: 1. Large evolving acute left MCA territory infarct involving the majority of the left MCA territory. Associated petechial hemorrhage anteriorly without frank hemorrhagic transformation. Localized gyral swelling and edema without significant mass effect at this time. 2. Chronic right basal ganglia lacunar infarct. 3. Underlying mild chronic small vessel ischemic disease. MRA HEAD IMPRESSION: 1. Persistent distal left M1/proximal M2 occlusion with evidence for intraluminal thrombus in the left sylvian fissure. 2. Otherwise grossly stable intracranial MRA with no other large vessel occlusion or hemodynamically significant stenosis. Electronically Signed   By: Jeannine Boga M.D.   On: 07/01/2018 18:00   Mr Brain Wo Contrast  Result Date: 07/01/2018 CLINICAL DATA:  Follow-up examination for acute stroke. EXAM: MRI HEAD WITHOUT CONTRAST MRA HEAD WITHOUT CONTRAST TECHNIQUE: Multiplanar, multiecho pulse sequences of the brain and surrounding structures were obtained without intravenous contrast. Angiographic images of the  head were obtained using MRA technique without contrast. COMPARISON:  Prior CTA perfusion from 06/30/2018. FINDINGS: MRI HEAD FINDINGS Brain: Examination severely degraded by motion artifact. Large confluent area of restricted diffusion seen involving the majority of  the left MCA territory, with involvement of the left frontal, parietal, and temporal lobes, consistent with acute left MCA territory infarct. Associated localized gyral swelling and edema without significant regional mass effect at this time. Evidence for petechial hemorrhage superior to the left insula noted (series 10, image 69). No frank hemorrhagic transformation. Additional susceptibility artifact within the left sylvian fissure favored to be related to persistent intravascular thrombus. Remote right basal ganglia lacunar infarct with associated chronic hemosiderin staining noted. Additional chronic hemosiderin staining noted at the right parietal lobe as well. Underlying age-related cerebral atrophy with mild chronic small vessel ischemic disease. No mass lesion or midline shift. No hydrocephalus. No extra-axial fluid collection. Pituitary gland normal. Vascular: Susceptibility artifact within the left sylvian fissure, most likely intraluminal arterial thrombus within left MCA branches. Major intracranial vascular flow voids otherwise maintained at the skull base. Skull and upper cervical spine: Craniocervical junction normal. Diffuse osseous changes related to multiple myeloma noted. No scalp soft tissue abnormality. Sinuses/Orbits: Globes and orbital soft tissues within normal limits. Paranasal sinuses are largely clear. No mastoid effusion. Inner ear structures grossly normal. Other: None. MRA HEAD FINDINGS ANTERIOR CIRCULATION: Examination severely degraded by motion artifact. Distal cervical segments of the internal carotid arteries are patent with antegrade flow. Petrous, cavernous, and supraclinoid segments patent without hemodynamically significant stenosis. A1 segments, anterior communicating artery, and anterior cerebral arteries grossly patent without stenosis. Proximal and mid left M1 patent. There is persistent occlusion of distal left M1/proximal M2 at the base of the sylvian fissure (series 4,  image 60). Persistent flow within the anterior temporal branch. No other significant flow within the left MCA territory. Right M1 widely patent without stenosis. No proximal right M2 occlusion. Distal right MCA branches grossly perfused. POSTERIOR CIRCULATION: Vertebral arteries grossly patent to the vertebrobasilar junction without stenosis. Posterior inferior cerebral arteries patent bilaterally. Basilar patent to its distal aspect without obvious stenosis. Superior cerebral arteries patent bilaterally. PCAs grossly patent to their distal aspects without obvious stenosis. IMPRESSION: MRI HEAD IMPRESSION: 1. Large evolving acute left MCA territory infarct involving the majority of the left MCA territory. Associated petechial hemorrhage anteriorly without frank hemorrhagic transformation. Localized gyral swelling and edema without significant mass effect at this time. 2. Chronic right basal ganglia lacunar infarct. 3. Underlying mild chronic small vessel ischemic disease. MRA HEAD IMPRESSION: 1. Persistent distal left M1/proximal M2 occlusion with evidence for intraluminal thrombus in the left sylvian fissure. 2. Otherwise grossly stable intracranial MRA with no other large vessel occlusion or hemodynamically significant stenosis. Electronically Signed   By: Jeannine Boga M.D.   On: 07/01/2018 18:00   [place]  Result Date: 07/07/2018 INDICATION: New onset of left gaze deviation, right-sided hemiplegia. Occluded dominant inferior division of the left middle cerebral artery and the distal left M1 segment on CT angiogram EXAM: 1. EMERGENT LARGE VESSEL OCCLUSION THROMBOLYSIS (anterior CIRCULATION) COMPARISON:  CT angiogram of the head and neck of 06/30/2018. MEDICATIONS: Ancef 2 g IV antibiotic was administered within 1 hour of the procedure. ANESTHESIA/SEDATION: General anesthesia CONTRAST:  Isovue 300 approximately 100 cc FLUOROSCOPY TIME:  Fluoroscopy Time: 93 minutes 6 seconds (4215 mGy).  COMPLICATIONS: None immediate. TECHNIQUE: Following a full explanation of the procedure along with the potential associated complications, an informed witnessed consent was  obtained from the patient's son who is also her power of attorney. The risks of intracranial hemorrhage of 10%, worsening neurological deficit, ventilator dependency, death and inability to revascularize were all reviewed in detail with the patient's son. The patient was then put under general anesthesia by the Department of Anesthesiology at [facility]. The right groin was prepped and draped in the usual sterile fashion. Thereafter using modified Seldinger technique, transfemoral access into the right common femoral artery was obtained without difficulty. Over a 0.035 inch guidewire a 5 French Pinnacle sheath was inserted. Through this, and also over a 0.035 inch guidewire a 5 French Simmons 2 diagnostic catheter was advanced to the aortic arch region and formed without difficulty. Selective cannulation of the left common carotid artery was then performed. Arteriograms were then performed via left common carotid artery injection centered extra cranially and intracranially, FINDINGS: The left common carotid arteriogram demonstrates tortuous take off of the origin of the left common carotid artery with severe tortuosity in its proximal half. The left common carotid bifurcation demonstrates the left external carotid artery and its major branches to be widely patent. The left internal carotid artery at the bulb to the cranial skull base also demonstrates wide patency with multiple areas of tortuosity extending into the cervical petrous junction. Patency of the petrous, cavernous and supraclinoid junctions is noted. The left middle cerebral artery and the left anterior cerebral artery opacify into the capillary and venous phases. The left middle cerebral artery demonstrates occlusion at the left MCA trifurcation at the origin of the dominant  large inferior division of the left middle cerebral artery. Diminutive anterior temporal and superior divisions were noted. PROCEDURE: ENDOVASCULAR TREATMENT OF OCCLUDED DISTAL LEFT MIDDLE CEREBRAL ARTERY M1 SEGMENT AT THE TRIFURCATION EXTENDING INTO THE DOMINANT INFERIOR DIVISION WITH MECHANICAL THROMBECTOMY USING THE EMBOTRAP 5 MM X 33 MM RETRIEVAL DEVICE. The diagnostic Simmons 2 catheter was then exchanged over a 0.035 inch 300 cm Rosen guidewire for an 80 cm 8 French neurovascular sheath using biplane roadmap technique and constant fluoroscopic guidance. The tip of the Rosen exchange guidewire was in the distal left external carotid artery branch. The tip of the neurovascular sheath was eventually maneuvered to be just proximal to the left common carotid bifurcation. The guidewire was removed. Good aspiration obtained from the hub of the neurovascular sheath. A gentle contrast injection demonstrated no evidence of spasms, dissections or of intraluminal filling defects. This was then connected to continuous heparinized saline infusion. At this time, over a 0.035 inch Roadrunner guidewire, a Navien 124 cm 6 [place] guide catheter was then advanced under biplane roadmap technique and constant fluoroscopic guidance to the distal cervical petrous junction of the left internal carotid artery. The guidewire was removed. Good aspiration obtained from the hub of the Navien guide catheter. A control arteriogram performed through the 5 [place] Navien guide catheter demonstrated no change in the intracranial circulation. No evidence of dissections or of intraluminal filling defects is noted. Over a 0.014 inch Synchro soft micro guidewire, a Trevo ProVue 021 microcatheter was then advanced just distal to the guide catheter. With the micro guidewire leading with a J-tip configuration, the combination was navigated to the supraclinoid left ICA. The micro guidewire was then advanced without difficulty through the occluded left  middle cerebral artery large inferior division to the M2 M3 region followed by the microcatheter. The guidewire was removed. Good aspiration obtained from the hub of the microcatheter. A gentle contrast injection through the microcatheter demonstrated safe tip  of the position of the microcatheter. This was then connected to continuous heparinized saline infusion. A 5 mm x 33 mm Embrotrap retrieval device was then advanced to the distal end of the microcatheter. The proximal and the distal landing zones of the retrieval device was then ascertained. The O ring on the delivery microcatheter was then loosened. With slight forward gentle traction with the right hand on the delivery micro guidewire, with the left hand the delivery microcatheter was retrieved unsheathing the entirety of the retrieval device. A control arteriogram performed through the 6 [place] Navien guide catheter in the left internal carotid artery cavernous segment demonstrated a TICI 2b revascularization of the left middle cerebral artery distribution with patency of the large inferior division of the left middle cerebral artery. At this time, with constant aspiration being applied with a 60 mL syringe at the hub of the 6 [place] Navien guide catheter, the combination of the retrieval device, the microcatheter were retrieved as aspiration was continued. Following removal of the retrieval device, aspiration was continued. The retrieval device was noted to have small clumps of clot entangled within the cells of the retrieval device. No clots were seen within aspirate. A diagnostic arteriogram performed through the 6 [place] Navien guide catheter demonstrated occluded previously opened large inferior division of the left middle cerebral artery. The remaining trifurcation branches were widely patent. A second attempt was then made using the 5 mm x 33 mm retrieval device which was again advanced over a 021 Trevo ProVue microcatheter into the M2 M3 region of  the dominant inferior division. After having ascertained safe position of the tip of the microcatheter, the retrieval device was then deployed as determined. A control arteriogram performed through the 6 [place] Navien guide catheter demonstrated again a TICI 2b reperfusion. Again with constant aspiration this time being applied using a Penumbra vacuum aspiration device, connected to the 6 [place] Navien guide catheter, the combination of the retrieval device, and the microcatheter were retrieved and removed. Again chunks of clot were noted entangled within the retrieval device. A control arteriogram performed through the Arrow neurovascular sheath in the left common carotid artery again demonstrated reocclusion of the large inferior division of the left middle cerebral artery. The remaining 2 branches were widely patent. Multiple attempts were made to again access the left middle cerebral artery occluded branch. There continued to be instability of the proximal pathway in the aortic arch region secondary to the severe tortuosity and the takeoff and the course of the left common carotid artery. Rescue stenting was considered but not performed in view of the patient's changes on the CT perfusion study and CT scan in this territory potentially increasing the risk of hemorrhage given the use of the dual platelets in the event of placing a rescue stent. The procedure was discontinued. The final control arteriogram performed through the 8 French neurovascular sheath in the left common carotid artery continued to demonstrate wide patency of the internal carotid artery in its entirety. The left middle cerebral artery trifurcation region demonstrated improved patency of the length of the dominant inferior division though it remained occluded more distally. The left anterior cerebral artery distribution remains widely patent. Throughout the procedure, the patient's blood pressure and neurological status remained stable. No  evidence of extravasation or of large additional intraluminal filling defects were seen. The patient's 8 [place] Arrow sheath was then retrieved into the abdominal aorta and exchanged over a J-tip guidewire for an 8 [place] Pinnacle sheath. This was then connected  to continuous heparinized saline infusion. A Dyna CT performed right after the treatment, demonstrated no gross evidence of intracranial hemorrhages, mass effect or midline shift. The patient's right groin appeared soft without evidence of a hematoma. Distal pulses were Dopplerable in the dorsalis pedis, and the posterior tibials bilaterally. The patient was then extubated without difficulty. Upon extubation, the patient was able to breathe on her own without any difficulty maintaining O2 saturations. Neurologically her pupils were 2+ sluggish and reactive. She continued to maintain left gaze deviation. No change was seen in the right side hemiplegia. The patient was then transferred to the neuro ICU for further stroke management. IMPRESSION: Status post endovascular revascularization of occluded dominant inferior division of the left middle cerebral artery extending to its origin in the M1 segment with achievement of a TICI 2b reperfusion on two occasions using the 5 mm x 33 mm the Embrotrap retrieval device with the reocclusion of the vessel as described above. PLAN: Patient transferred to the neuro ICU to continue with further stroke management. Electronically Signed   By: Luanne Bras M.D.   On: 07/01/2018 11:26   Ct Cerebral Perfusion W Contrast  Result Date: 06/30/2018 CLINICAL DATA:  Initial evaluation for acute stroke. EXAM: CT ANGIOGRAPHY HEAD AND NECK CT PERFUSION BRAIN TECHNIQUE: Multidetector CT imaging of the head and neck was performed using the standard protocol during bolus administration of intravenous contrast. Multiplanar CT image reconstructions and MIPs were obtained to evaluate the vascular anatomy. Carotid stenosis  measurements (when applicable) are obtained utilizing NASCET criteria, using the distal internal carotid diameter as the denominator. Multiphase CT imaging of the brain was performed following IV bolus contrast injection. Subsequent parametric perfusion maps were calculated using RAPID software. CONTRAST:  154m ISOVUE-370 IOPAMIDOL (ISOVUE-370) INJECTION 76% COMPARISON:  Prior noncontrast CT from earlier the same day. FINDINGS: CTA NECK FINDINGS Aortic arch: Visualized aortic arch of normal caliber with normal branch pattern. No hemodynamically significant stenosis about the origin of the great vessels. Visualized subclavian arteries widely patent. Right carotid system: Right common carotid artery tortuous proximally but widely patent to the bifurcation. Mild atheromatous plaque about the proximal right ICA without stenosis. Right ICA widely patent distally to the skull base without stenosis, dissection, or occlusion Left carotid system: Left common carotid artery tortuous proximally but widely patent to the bifurcation without stenosis. Mild plaque about the left bifurcation without hemodynamically significant stenosis. Left ICA patent distally to the skull base without stenosis, dissection, or occlusion. Vertebral arteries: Both of the vertebral arteries arise from the subclavian arteries. Vertebral arteries widely patent within the neck without stenosis, dissection, or occlusion. Skeleton: Innumerable lytic lesions seen diffusely throughout the visualized spine, compatible with history of multiple myeloma. No acute osseous abnormality. Other neck: Soft tissues of the neck demonstrate no acute finding. Enlarged goiter is change of the left lobe of thyroid with substernal extension. Upper chest: Visualized upper chest within normal limits. Partially visualized lungs are clear. Review of the MIP images confirms the above findings CTA HEAD FINDINGS Anterior circulation: Internal carotid arteries patent to the  termini without hemodynamically significant stenosis. A1 segments patent. Anterior communicating artery normal. Anterior cerebral arteries patent to their distal aspects. Left M1 patent proximally. There is prepped occlusion of the distal left M1/proximal M2 at the left MCA bifurcation (series 9, image 96). Attenuated and reduced flow seen distally within left MCA branches. Right M1 patent. Normal right MCA bifurcation. Distal right MCA branches well perfused. Posterior circulation: Vertebral arteries diminutive but patent to  the vertebrobasilar junction without stenosis. Left PICA patent. Right PICA not well visualized. Basilar diminutive but patent to its distal aspect without stenosis. Superior cerebral arteries patent proximally. Fetal type origin of the left PCA. Hypoplastic right P1 with robust right posterior communicating artery. PCAs patent to their distal aspects. Venous sinuses: Grossly patent, although not well assessed due to timing of the contrast bolus. Anatomic variants: Predominant fetal type origin of the PCAs. 4 mm focal outpouching arising from the right MCA bifurcation, suspicious for aneurysm. Delayed phase: Not performed. Review of the MIP images confirms the above findings CT Brain Perfusion Findings: CBF (<30%) Volume: 85m Perfusion (Tmax>6.0s) volume: 973mMismatch Volume: 697mnfarction Location:Anterior left MCA distribution, involving the left insular region and operculum. IMPRESSION: 1. Positive CTA with EVLO of distal left M1/proximal M2 branch at the base of the sylvian fissure. Acute left MCA territory infarct with surrounding penumbra as above. 2. Additional mild atheromatous change throughout the major arterial vasculature of the head and neck as above. No other hemodynamically significant or correctable identified. 3. 4 mm right MCA bifurcation aneurysm. 4. Innumerable lucent lesions throughout the visualized skeleton, compatible with multiple myeloma. Findings discussed with  Dr. AroRory Percy approximately 5:45 p.m. on 06/30/2018. Electronically Signed   By: BenJeannine BogaD.   On: 06/30/2018 19:21   Dg Chest Port 1 View  Result Date: 07/02/2018 CLINICAL DATA:  Shortness of breath, cough, history of asthma, hypertension, acute CVA. EXAM: PORTABLE CHEST 1 VIEW COMPARISON:  PA and lateral chest x-rays of July 01, 2018 and December 10, 2016. FINDINGS: The patient is rotated toward the right. The lungs are adequately inflated. The interstitial markings are coarse at both bases. The cardiac silhouette is enlarged. The pulmonary vascularity is normal. Old right lateral rib fractures are present and stable. There are degenerative changes of both shoulders. IMPRESSION: Persistent bibasilar atelectasis. Mild enlargement of the cardiac silhouette without pulmonary vascular congestion or pulmonary edema. Electronically Signed   By: David  JorMartiniqueD.   On: 07/02/2018 11:48   Dg Abd Portable 1v  Result Date: 07/02/2018 CLINICAL DATA:  Feeding tube placement EXAM: PORTABLE ABDOMEN - 1 VIEW COMPARISON:  None. FINDINGS: The tip of a feeding catheter is positioned in the second portion of the duodenum. Visualized abdomen demonstrates nonspecific bowel gas pattern. IMPRESSION: Feeding tube is transpyloric with the tip in the distal descending duodenum. Electronically Signed   By: EriMisty StanleyD.   On: 07/02/2018 13:31   Dg Swallowing Func-speech Pathology  Result Date: 07/04/2018 Objective Swallowing Evaluation: Type of Study: MBS-Modified Barium Swallow Study  Patient Details Name: Ruth BENBROOKN: [id number] of Birth: [birth year]'s Date: 07/04/2018 Time: SLP Start Time (ACUTE ONLY): 1335 -SLP Stop Time (ACUTE ONLY): 1400 SLP Time Calculation (min) (ACUTE ONLY): 25 min Past Medical History: Past Medical History: Diagnosis Date . Arthritis  . Asthma  . [place] Hy[place]  Past Surgical History: Past Surgical History: Procedure Laterality Date . APPENDECTOMY    . CHOLECYSTECTOMY   . ESOPHAGOGASTRODUODENOSCOPY N/A 11/24/2015  Procedure: ESOPHAGOGASTRODUODENOSCOPY (EGD);  Surgeon: MarClarene EssexD;  [facility];  Service: Endoscopy;  Laterality: N/A; . RADIOLOGY WITH ANESTHESIA N/A 06/30/2018  Procedure: IR WITH ANESTHESIA;  Surgeon: DevLuanne BrasD;  [facility];  Laterality: N/A; . TUBAL LIGATION   HPI: [age]ar old female admitted 06/30/18 with stroke symptoms. PMH: arthritis, asthma, HTN. MRI pending.  Subjective: Pt resting in bed. Family present. Assessment / Plan /  Recommendation [facility] IP CLINICAL IMPRESSIONS 07/04/2018 Clinical Impression Pt demonstrates slow oral manipulation with max hand over hand assist needed to elicit automatic responses in MBS setting (more so than at bedside). Oral holding observed with puree and mechanical soft solids. Liquids that spill to the vestibule more readily trigger full oral transit and swallow reponse, but nectar is aspirated without sensation prior to the swallow. Honey is tolerated quite well and there are no pharyngeal residuals. Recommend pt initaite a puree diet with honey thick liquids. Expect improved tolerance with more natural meal setting. If pt consumes adequate quantities over the next 24-48 hourse could consider NG tube removal. SLP Visit Diagnosis Dysphagia, oropharyngeal phase (R13.12) Attention and concentration deficit following -- Frontal lobe and executive function deficit following -- Impact on safety and function Mild aspiration risk   [facility] IP TREATMENT RECOMMENDATION 07/04/2018 Treatment Recommendations Therapy as outlined in treatment plan below   Prognosis 07/01/2018 Prognosis for Safe Diet Advancement Fair Barriers to Reach Goals Language deficits;Severity of deficits Barriers/Prognosis Comment -- [facility] IP DIET RECOMMENDATION 07/04/2018 SLP Diet Recommendations Dysphagia 1 (Puree) solids;Honey thick liquids Liquid Administration via Cup Medication Administration Crushed with puree  Compensations Slow rate;Small sips/bites Postural Changes Seated upright at 90 degrees   CH[facility] IP OTHER RECOMMENDATIONS 07/04/2018 Recommended Consults -- Oral Care Recommendations -- Other Recommendations Have oral suction available   [facility] IP FOLLOW UP RECOMMENDATIONS 07/04/2018 Follow up Recommendations Skilled Nursing facility   [facility] IP FREQUENCY AND DURATION 07/04/2018 Speech Therapy Frequency (ACUTE ONLY) min 2x/week Treatment Duration 2 weeks      [facility] IP ORAL PHASE 07/04/2018 Oral Phase Impaired Oral - Pudding Teaspoon -- Oral - Pudding Cup -- Oral - Honey Teaspoon -- Oral - Honey Cup Delayed oral transit;Decreased bolus cohesion;Right anterior bolus loss Oral - Nectar Teaspoon Delayed oral transit;Decreased bolus cohesion;Right anterior bolus loss Oral - Nectar Cup Delayed oral transit;Decreased bolus cohesion;Right anterior bolus loss Oral - Nectar Straw -- Oral - Thin Teaspoon -- Oral - Thin Cup -- Oral - Thin Straw -- Oral - Puree Delayed oral transit;Decreased bolus cohesion;Right anterior bolus loss Oral - Mech Soft Delayed oral transit;Decreased bolus cohesion;Right anterior bolus loss Oral - Regular -- Oral - Multi-Consistency -- Oral - Pill -- Oral Phase - Comment --  [facility] IP PHARYNGEAL PHASE 07/04/2018 Pharyngeal Phase Impaired Pharyngeal- Pudding Teaspoon -- Pharyngeal -- Pharyngeal- Pudding Cup -- Pharyngeal -- Pharyngeal- Honey Teaspoon Delayed swallow initiation-pyriform sinuses Pharyngeal -- Pharyngeal- Honey Cup Delayed swallow initiation-pyriform sinuses Pharyngeal -- Pharyngeal- Nectar Teaspoon -- Pharyngeal -- Pharyngeal- Nectar Cup Delayed swallow initiation-pyriform sinuses;Penetration/Aspiration before swallow;Penetration/Aspiration during swallow;Trace aspiration Pharyngeal Material enters airway, passes BELOW cords without attempt by patient to eject out (silent aspiration) Pharyngeal- Nectar Straw -- Pharyngeal -- Pharyngeal- Thin Teaspoon -- Pharyngeal -- Pharyngeal- Thin Cup -- Pharyngeal --  Pharyngeal- Thin Straw -- Pharyngeal -- Pharyngeal- Puree Delayed swallow initiation-vallecula Pharyngeal -- Pharyngeal- Mechanical Soft NT Pharyngeal -- Pharyngeal- Regular -- Pharyngeal -- Pharyngeal- Multi-consistency -- Pharyngeal -- Pharyngeal- Pill -- Pharyngeal -- Pharyngeal Comment --  No flowsheet data found. DeBlois, Katherene Ponto 07/04/2018, 2:34 PM              Ir Percutaneous Art Thrombectomy/infusion Intracranial Inc Diag Angio  Result Date: 07/07/2018 INDICATION: New onset of left gaze deviation, right-sided hemiplegia. Occluded dominant inferior division of the left middle cerebral artery and the distal left M1 segment on CT angiogram EXAM: 1. EMERGENT LARGE VESSEL OCCLUSION THROMBOLYSIS (anterior CIRCULATION) COMPARISON:  CT angiogram of the head and neck of  06/30/2018. MEDICATIONS: Ancef 2 g IV antibiotic was administered within 1 hour of the procedure. ANESTHESIA/SEDATION: General anesthesia CONTRAST:  Isovue 300 approximately 100 cc FLUOROSCOPY TIME:  Fluoroscopy Time: 93 minutes 6 seconds (4215 mGy). COMPLICATIONS: None immediate. TECHNIQUE: Following a full explanation of the procedure along with the potential associated complications, an informed witnessed consent was obtained from the patient's son who is also her power of attorney. The risks of intracranial hemorrhage of 10%, worsening neurological deficit, ventilator dependency, death and inability to revascularize were all reviewed in detail with the patient's son. The patient was then put under general anesthesia by the Department of Anesthesiology at [facility]. The right groin was prepped and draped in the usual sterile fashion. Thereafter using modified Seldinger technique, transfemoral access into the right common femoral artery was obtained without difficulty. Over a 0.035 inch guidewire a 5 French Pinnacle sheath was inserted. Through this, and also over a 0.035 inch guidewire a 5 French Simmons 2 diagnostic catheter  was advanced to the aortic arch region and formed without difficulty. Selective cannulation of the left common carotid artery was then performed. Arteriograms were then performed via left common carotid artery injection centered extra cranially and intracranially, FINDINGS: The left common carotid arteriogram demonstrates tortuous take off of the origin of the left common carotid artery with severe tortuosity in its proximal half. The left common carotid bifurcation demonstrates the left external carotid artery and its major branches to be widely patent. The left internal carotid artery at the bulb to the cranial skull base also demonstrates wide patency with multiple areas of tortuosity extending into the cervical petrous junction. Patency of the petrous, cavernous and supraclinoid junctions is noted. The left middle cerebral artery and the left anterior cerebral artery opacify into the capillary and venous phases. The left middle cerebral artery demonstrates occlusion at the left MCA trifurcation at the origin of the dominant large inferior division of the left middle cerebral artery. Diminutive anterior temporal and superior divisions were noted. PROCEDURE: ENDOVASCULAR TREATMENT OF OCCLUDED DISTAL LEFT MIDDLE CEREBRAL ARTERY M1 SEGMENT AT THE TRIFURCATION EXTENDING INTO THE DOMINANT INFERIOR DIVISION WITH MECHANICAL THROMBECTOMY USING THE EMBOTRAP 5 MM X 33 MM RETRIEVAL DEVICE. The diagnostic Simmons 2 catheter was then exchanged over a 0.035 inch 300 cm Rosen guidewire for an 80 cm 8 French neurovascular sheath using biplane roadmap technique and constant fluoroscopic guidance. The tip of the Rosen exchange guidewire was in the distal left external carotid artery branch. The tip of the neurovascular sheath was eventually maneuvered to be just proximal to the left common carotid bifurcation. The guidewire was removed. Good aspiration obtained from the hub of the neurovascular sheath. A gentle contrast injection  demonstrated no evidence of spasms, dissections or of intraluminal filling defects. This was then connected to continuous heparinized saline infusion. At this time, over a 0.035 inch Roadrunner guidewire, a Navien 124 cm 6 [place] guide catheter was then advanced under biplane roadmap technique and constant fluoroscopic guidance to the distal cervical petrous junction of the left internal carotid artery. The guidewire was removed. Good aspiration obtained from the hub of the Navien guide catheter. A control arteriogram performed through the 5 [place] Navien guide catheter demonstrated no change in the intracranial circulation. No evidence of dissections or of intraluminal filling defects is noted. Over a 0.014 inch Synchro soft micro guidewire, a Trevo ProVue 021 microcatheter was then advanced just distal to the guide catheter. With the micro guidewire leading with a J-tip configuration, the  combination was navigated to the supraclinoid left ICA. The micro guidewire was then advanced without difficulty through the occluded left middle cerebral artery large inferior division to the M2 M3 region followed by the microcatheter. The guidewire was removed. Good aspiration obtained from the hub of the microcatheter. A gentle contrast injection through the microcatheter demonstrated safe tip of the position of the microcatheter. This was then connected to continuous heparinized saline infusion. A 5 mm x 33 mm Embrotrap retrieval device was then advanced to the distal end of the microcatheter. The proximal and the distal landing zones of the retrieval device was then ascertained. The O ring on the delivery microcatheter was then loosened. With slight forward gentle traction with the right hand on the delivery micro guidewire, with the left hand the delivery microcatheter was retrieved unsheathing the entirety of the retrieval device. A control arteriogram performed through the 6 [place] Navien guide catheter in the left  internal carotid artery cavernous segment demonstrated a TICI 2b revascularization of the left middle cerebral artery distribution with patency of the large inferior division of the left middle cerebral artery. At this time, with constant aspiration being applied with a 60 mL syringe at the hub of the 6 [place] Navien guide catheter, the combination of the retrieval device, the microcatheter were retrieved as aspiration was continued. Following removal of the retrieval device, aspiration was continued. The retrieval device was noted to have small clumps of clot entangled within the cells of the retrieval device. No clots were seen within aspirate. A diagnostic arteriogram performed through the 6 [place] Navien guide catheter demonstrated occluded previously opened large inferior division of the left middle cerebral artery. The remaining trifurcation branches were widely patent. A second attempt was then made using the 5 mm x 33 mm retrieval device which was again advanced over a 021 Trevo ProVue microcatheter into the M2 M3 region of the dominant inferior division. After having ascertained safe position of the tip of the microcatheter, the retrieval device was then deployed as determined. A control arteriogram performed through the 6 [place] Navien guide catheter demonstrated again a TICI 2b reperfusion. Again with constant aspiration this time being applied using a Penumbra vacuum aspiration device, connected to the 6 [place] Navien guide catheter, the combination of the retrieval device, and the microcatheter were retrieved and removed. Again chunks of clot were noted entangled within the retrieval device. A control arteriogram performed through the Arrow neurovascular sheath in the left common carotid artery again demonstrated reocclusion of the large inferior division of the left middle cerebral artery. The remaining 2 branches were widely patent. Multiple attempts were made to again access the left middle cerebral  artery occluded branch. There continued to be instability of the proximal pathway in the aortic arch region secondary to the severe tortuosity and the takeoff and the course of the left common carotid artery. Rescue stenting was considered but not performed in view of the patient's changes on the CT perfusion study and CT scan in this territory potentially increasing the risk of hemorrhage given the use of the dual platelets in the event of placing a rescue stent. The procedure was discontinued. The final control arteriogram performed through the 8 French neurovascular sheath in the left common carotid artery continued to demonstrate wide patency of the internal carotid artery in its entirety. The left middle cerebral artery trifurcation region demonstrated improved patency of the length of the dominant inferior division though it remained occluded more distally. The left anterior cerebral artery  distribution remains widely patent. Throughout the procedure, the patient's blood pressure and neurological status remained stable. No evidence of extravasation or of large additional intraluminal filling defects were seen. The patient's 8 [place] Arrow sheath was then retrieved into the abdominal aorta and exchanged over a J-tip guidewire for an 8 [place] Pinnacle sheath. This was then connected to continuous heparinized saline infusion. A Dyna CT performed right after the treatment, demonstrated no gross evidence of intracranial hemorrhages, mass effect or midline shift. The patient's right groin appeared soft without evidence of a hematoma. Distal pulses were Dopplerable in the dorsalis pedis, and the posterior tibials bilaterally. The patient was then extubated without difficulty. Upon extubation, the patient was able to breathe on her own without any difficulty maintaining O2 saturations. Neurologically her pupils were 2+ sluggish and reactive. She continued to maintain left gaze deviation. No change was seen in the  right side hemiplegia. The patient was then transferred to the neuro ICU for further stroke management. IMPRESSION: Status post endovascular revascularization of occluded dominant inferior division of the left middle cerebral artery extending to its origin in the M1 segment with achievement of a TICI 2b reperfusion on two occasions using the 5 mm x 33 mm the Embrotrap retrieval device with the reocclusion of the vessel as described above. PLAN: Patient transferred to the neuro ICU to continue with further stroke management. Electronically Signed   By: Luanne Bras M.D.   On: 07/01/2018 11:26   Ct Head Code Stroke Wo Contrast  Result Date: 06/30/2018 CLINICAL DATA:  Code stroke. Initial evaluation for acute right-sided gaze. EXAM: CT HEAD WITHOUT CONTRAST TECHNIQUE: Contiguous axial images were obtained from the base of the skull through the vertex without intravenous contrast. COMPARISON:  Prior CT from 11/24/2015. FINDINGS: Brain: Age-related cerebral atrophy with chronic small vessel ischemic disease. Remote lacunar infarct present within the right basal ganglia. Subtle hypodensity seen involving the left insular ribbon, consistent with evolving acute ischemic left MCA territory infarct. No acute intracranial hemorrhage. No mass lesion, midline shift or mass effect. No hydrocephalus. No extra-axial fluid collection. Vascular: No definite hyperdense vessel identified. Scattered vascular calcifications noted within the carotid siphons. Skull: Scalp soft tissues within normal limits. Multiple lucent lesion seen throughout the calvarium, consistent with multiple myeloma. Sinuses/Orbits: Left-sided gaze noted. Paranasal sinuses and mastoid air cells are clear. Other: None. ASPECTS [facility] Stroke Program Early CT Score) - Ganglionic level infarction (caudate, lentiform nuclei, internal capsule, insula, M1-M3 cortex): 6 - Supraganglionic infarction (M4-M6 cortex): 3 Total score (0-10 with 10 being normal): 9  IMPRESSION: 1. Abnormal hypodensity involving the left insular cortex, suspicious for acute left MCA territory infarct. 2. ASPECTS is 9. These results were communicated to Dr. Rory Percy at 6:25 pmon 9/9/2019by text page via the [facility] messaging system. Electronically Signed   By: Jeannine Boga M.D.   On: 06/30/2018 18:25    TTE - Left ventricle: The cavity size was normal. Wall thickness wasnormal. Systolic function was normal. The estimated ejectionfraction was in the range of 55% to 60%. Wall motion was normal;there were no regional wall motion abnormalities. - Aortic valve: There was trivial regurgitation. - Mitral valve: There was mild regurgitation. - Left atrium: The atrium was severely dilated. - Right atrium: The atrium was mildly to moderately dilated. - Tricuspid valve: There was moderate regurgitation. - Pulmonary arteries: Systolic pressure was mildly increased. PApeak pressure: 33 mm Hg (S).     HISTORY OF PRESENT ILLNESS Ruth Sanchez is a 72 y.o. female past  medical history of hypertension, multiple myeloma under remission, last known normal at 9 AM 06/30/2018 with sudden onset of leftward gaze and inability to talk. She was noted to be flaccid on the right side and gazing to the left by a caretaker and EMS was called. Upon arrival, there was no family accompanying the patient. The patient was seen and evaluated at the bridge of the emergency room and found to have left MCA syndrome. She was outside the window for IV TPA.  She was within the window for endovascular thrombectomy and was taken for emergent CT and CTA head and neck and CT perfusion studies. CT of the head showed an aspects of 9. CTA head and neck showed a left M1/proximal M2 large vessel occlusion. Penumbra 98 cc/score 29 cc with a mismatch volume of 69 cc. Case discussed with endovascular-Dr. Estanislado Pandy and patient taken in for an emergent thrombectomy. Family arrived later and was updated on the treatment plan.  Premorbid modified Rankin scale (mRS): 0-1. They consented for the IR procedure. Of note-atrial fibrillation was noted on the monitor. In IR, initial TICI2b revascularization with subsequent reocclusion.  She was admitted to the neuro ICU.   HOSPITAL COURSE Ruth Sanchez is a 72 y.o. female with history of HTN, MM in remission admitted for left gaze, aphasia and right weakness. No tPA given due to OSW.    Mechanical thrombectomy was attempted with initial TICI2b revascularization with subsequent reocclusion.  Her left MCA infarct was found to be embolic secondary to new diagnosis atrial fibrillation.  Once stable post stroke and able to swallow a dysphagia 1 honey thick diet, patient was started on Eliquis.  Given her right hemiparesis and aphasia, she will no longer be able to care for self at home and patient will be discharged to SNF for ongoing PT OT and speech therapy.  Stroke:  left MCA large infarct due to left M1 occlusion, embolic secondary to new diagnosis of afib  Resultant full blown of left MCA syndrome  CT old right CR infarct  CTA head and neck left M1 M2 bifurcation occlusion  DSA - left M2 occlusion with initial TICI2b reperfusion but then re-occluded. TICI 2 b reperfusion with subsequent reocclusion. Unsuccessful intervention  MRI confirmed left large MCA infarct  MRA persistent left M1 occlusion  2D Echo EF 55-60%  LDL 62  HgbA1c 5.3  No antithrombotic prior to admission, treated with Plavix in the hospital due to now ASA allergy.  Started on Eliquis 07/07/2018 for secondary stroke prevention.  Plavix was discontinued.    Therapy recommendations:  SNF  Afib - new diagnosis  Tele and EKG confirmed afib rhythm  Intermittent RVR, resumed coreg 12.6m bid  Likely the cause of current stroke  Rate under control  Started on Eliquis 07/07/2018, continue at d/c  Dysphagia secondary to stroke  Treated with cortrak TF in hospital  Passed swallow, now diet  increased to D1 honey thick  Eating well per ST  Leukocytosis  WBC 8.4->19.8->15.5->16.0->14.3->14.0  UA neg  CXR 07/01/18 no active disease  CXR 07/02/18 no active disease  Afebrile  Hypertension  Treated with cleviprex in the ICU  BP Stable  On home meds with norvasc and coreg  Long term BP goal 130-150 given left M1 persistent occlusion  Other Stroke Risk Factors  Advanced age  Right groin wound   at site of IR right groin stick   WOC RN consulted  Dressing change  1. Apply Santyl daily  2. Cover  with 2x2 gauze damp with saline  3. Top with dry gauze and secure with tape.  Other Active Problems  MM in remission  Hypokalemia, resolved - 3.9     DISCHARGE EXAM  Blood pressure 134/74, pulse 85, temperature 98.4 F (36.9 C), temperature source Oral, resp. rate 18, height 5' (1.524 m), weight 67.2 kg, SpO2 95 %. General - Well nourished, well developed, not in acute distress. Cardiovascular - irregularly irregular heart rate and rhythm.  Neuro - eyes open, still global aphasia, not following commands, no language output. Not repeating or naming. Left forced gaze, right neglect, not blinking to visual threat on the right. PERRL, not tracking on the right. RUE 0/5 and RLE mild withdraw on pain stimulation. LUE and LLE spontaneous movement and against gravity. Sensation, coordination and gait not tested.   Discharge Diet   Dysphagia I honey thick liquids  DISCHARGE PLAN  Disposition:  Discharge to skilled nursing facility for ongoing PT, OT and ST.   Eliquis (apixaban) daily for secondary stroke prevention.  Continue daily wound care: Santyl daily, cover with 2 x 2 damp saline, top with dry gauze and secure with tape  Ongoing risk factor control by Primary Care Physician at time of discharge  Follow-up [name], MD or MD at [facility] for medical care in 1-2 weeks  Follow-up in [facility] in 4 weeks, office to  schedule an appointment.   40 minutes were spent preparing discharge.  Burnetta Sabin, MSN, APRN, ANVP-BC, AGPCNP-BC Advanced Practice Stroke Nurse [place] for Schedule & Pager information 07/08/2018 2:29 PM  I have personally examined this patient, reviewed notes, independently viewed imaging studies, participated in medical decision making and plan of care.ROS completed by me personally and pertinent positives fully documented  I have made any additions or clarifications directly to the above note. Agree with note above.   Antony Contras, MD Medical Director [facility] Pager: [phone] 07/08/2018 3:28 PM

## 2018-07-08 NOTE — Progress Notes (Signed)
Pt picked up by [facility] to be transported off to disposition. P. Amo Saathvik Every RN 

## 2018-07-08 NOTE — Clinical Social Work Placement (Addendum)
Nurse to call report to [phone], Room 302A   Nurse to please call the patient's son, Adrian Prince, when patient is picked up by [facility]: [phone]     CLINICAL SOCIAL WORK PLACEMENT  NOTE  Date:  07/08/2018  Patient Details  Name: Ruth Sanchez MRN: [id number] Date of Birth: 06-09-1946  Clinical Social Work is seeking post-discharge placement for this patient at the [place] level of care (*CSW will initial, date and re-position this form in  chart as items are completed):  Yes   Patient/family provided with [place] Work Department's list of facilities offering this level of care within the geographic area requested by the patient (or if unable, by the patient's family).  Yes   Patient/family informed of their freedom to choose among providers that offer the needed level of care, that participate in Medicare, Medicaid or managed care program needed by the patient, have an available bed and are willing to accept the patient.  Yes   Patient/family informed of [facility]'s ownership interest in [facility] and [facility], as well as of the fact that they are under no obligation to receive care at these facilities.  PASRR submitted to EDS on 07/03/18     PASRR number received on 07/03/18     Existing PASRR number confirmed on       FL2 transmitted to all facilities in geographic area requested by pt/family on 07/03/18     FL2 transmitted to all facilities within larger geographic area on       Patient informed that his/her managed care company has contracts with or will negotiate with certain facilities, including the following:        Yes   Patient/family informed of bed offers received.  Patient chooses bed at [facility]     Physician recommends and patient chooses bed at      Patient to be transferred to [facility] on 07/08/18.  Patient to be transferred to facility by [facility]     Patient family  notified on 07/08/18 of transfer.  Name of family member notified:  Son, Demond     PHYSICIAN       Additional Comment:    _______________________________________________ Geralynn Ochs, LCSW 07/08/2018, 3:14 PM

## 2018-07-08 NOTE — Progress Notes (Signed)
Called report to [place] at [facility].  Awaiting [facility] for transportation.

## 2018-07-08 NOTE — Consult Note (Signed)
[place] Nurse wound consult note Reason for Consult: right groin wound Patient had IR procedure 06/30/18, bedside nurse discovered today that the wound has become larger than a traditional entrance site and has some non viable tissue. [place] nurse has been requested for wound care orders pending DC to SNF today.  No family in the room, patient is non verbal.  Wound type: surgical, full thickness Pressure Injury POA: NA Measurement:2.0cm x 1.0cm x 0.2cm  Wound bed:80% yellow non viable tissue/10% black/ 10% pink at lateral edge of wound, does not probe with sterile applicator Drainage (amount, consistency, odor) minimal Periwound: intact, some superficial skin loss at wound perimeter  Dressing procedure/placement/frequency: Add enzymatic debridement to clean the non viable tissue away, cover with small saline moist gauze. Top with dry dressing change daily.   DC with same orders 1. Apply Santyl daily 2. Cover with 2x2 gauze damp with saline 3. Top with dry gauze and secure with tape.  Discussed POC with bedside nurse.  Re consult if needed, will not follow at this time. Thanks  Allyah Heather [employer], RN,CWOCN, CNS, [place] [phone])

## 2018-07-09 ENCOUNTER — Telehealth: Payer: Self-pay | Admitting: *Deleted

## 2018-07-09 NOTE — Telephone Encounter (Signed)
Returned call to facility.  Tiffany request "October 1st appontments (provider F/U w/ infusion) be rescheduled to later in the month."

## 2018-07-09 NOTE — Telephone Encounter (Signed)
"  [place], [employer] [phone]).  We received Ruth Sanchez yesterday upon hospital discharge.  Calling to confirm appointments for transportation.  We were told she has a history of cancer but then learned she has a lot of appointments."  All current future appointment information provided with arrival times thirty minutes prior for registation process.

## 2018-07-10 ENCOUNTER — Telehealth: Payer: Self-pay | Admitting: Medical Oncology

## 2018-07-10 DIAGNOSIS — C9000 Multiple myeloma not having achieved remission: Secondary | ICD-10-CM | POA: Diagnosis not present

## 2018-07-10 DIAGNOSIS — I482 Chronic atrial fibrillation: Secondary | ICD-10-CM | POA: Diagnosis not present

## 2018-07-10 DIAGNOSIS — Z789 Other specified health status: Secondary | ICD-10-CM | POA: Diagnosis not present

## 2018-07-10 DIAGNOSIS — I10 Essential (primary) hypertension: Secondary | ICD-10-CM | POA: Diagnosis not present

## 2018-07-10 DIAGNOSIS — G8191 Hemiplegia, unspecified affecting right dominant side: Secondary | ICD-10-CM | POA: Diagnosis not present

## 2018-07-10 DIAGNOSIS — I69320 Aphasia following cerebral infarction: Secondary | ICD-10-CM | POA: Diagnosis not present

## 2018-07-10 NOTE — Telephone Encounter (Signed)
Needs to postpone appt until nov. Schedule request sent,.

## 2018-07-10 NOTE — Telephone Encounter (Signed)
"  Have not received call in reference to appointments.  Could the nurse return call to Charlsie Merles.  My direct line is [phone] ext. [phone]; it is okay to leave a message.  Mosetta Pigeon is in [place] at [employer] receiving rehab under [place] Benefits after stroke.  She cannot receive other treatments and medications concurrently,  These will not be paid along with Medicare Skilled Nursing/Rehab benefit.Marland Kitchen  Benefit is paid up to one hundred days, however we observe twenty days.    Appointments can begin November 1st or end of October at the earliest.    ADDYSEN LOUTH had a major stroke; unable to talk at this time; will be here long term.  For future communications with patient, [facility] can call our main number [phone] to reach staff in reference to mutual pt." Routing call information to collaborative for provider review. Tiffany aware provider in clinic [facility]) today however staff will return call upon receipt of provider orders/instructions.

## 2018-07-11 ENCOUNTER — Telehealth: Payer: Self-pay | Admitting: Internal Medicine

## 2018-07-11 ENCOUNTER — Other Ambulatory Visit: Payer: Self-pay

## 2018-07-11 NOTE — Patient Outreach (Signed)
[facility]) Care Management  07/11/2018  Ruth Sanchez [birth year] [id number]      EMMI-STROKE RED ON EMMI ALERT Day # 1 Date: 07/10/18 Red Alert Reason: "feeling worse overall? Yes  New problems walking/talking/speaking/seeing? Yes"    Red on EMMI dashboard alert received. Upon chart review patient noted to be discharged from hospital to SNF for long term care. Patient is not appropriate to be receiving automated EMMI f/u calls post discharge as they were transferred to facility. No outreach to patient warranted at this time.      Plan: RN CM will close case at this time. RN CM will notify [facility] administrative assistant to deactivate EMMI calls.  Enzo Montgomery, RN,BSN,CCM [place] Management Telephonic Care Management Coordinator Direct Phone: [phone] Toll Free: [phone] Fax: [phone]

## 2018-07-11 NOTE — Telephone Encounter (Signed)
Spoke with Tiffany regarding appointment added per 9/19 sch msg

## 2018-07-15 ENCOUNTER — Inpatient Hospital Stay: Payer: Medicare Other

## 2018-07-18 ENCOUNTER — Emergency Department
Admission: EM | Admit: 2018-07-18 | Discharge: 2018-07-18 | Disposition: A | Discharge status: Home / Self Care | Payer: Medicare Other | Attending: Student in an Organized Health Care Education/Training Program | Admitting: Student in an Organized Health Care Education/Training Program

## 2018-07-18 ENCOUNTER — Emergency Department: Payer: Medicare Other

## 2018-07-18 DIAGNOSIS — M79641 Pain in right hand: Secondary | ICD-10-CM | POA: Diagnosis not present

## 2018-07-18 DIAGNOSIS — W050XXA Fall from non-moving wheelchair, initial encounter: Secondary | ICD-10-CM | POA: Insufficient documentation

## 2018-07-18 DIAGNOSIS — S6991XA Unspecified injury of right wrist, hand and finger(s), initial encounter: Secondary | ICD-10-CM | POA: Diagnosis not present

## 2018-07-18 DIAGNOSIS — Z859 Personal history of malignant neoplasm, unspecified: Secondary | ICD-10-CM | POA: Insufficient documentation

## 2018-07-18 DIAGNOSIS — S01111A Laceration without foreign body of right eyelid and periocular area, initial encounter: Secondary | ICD-10-CM

## 2018-07-18 DIAGNOSIS — Z8673 Personal history of transient ischemic attack (TIA), and cerebral infarction without residual deficits: Secondary | ICD-10-CM | POA: Diagnosis not present

## 2018-07-18 DIAGNOSIS — I4891 Unspecified atrial fibrillation: Secondary | ICD-10-CM | POA: Diagnosis not present

## 2018-07-18 DIAGNOSIS — I10 Essential (primary) hypertension: Secondary | ICD-10-CM | POA: Insufficient documentation

## 2018-07-18 DIAGNOSIS — S01112A Laceration without foreign body of left eyelid and periocular area, initial encounter: Secondary | ICD-10-CM | POA: Diagnosis not present

## 2018-07-18 DIAGNOSIS — J45909 Unspecified asthma, uncomplicated: Secondary | ICD-10-CM | POA: Diagnosis not present

## 2018-07-18 DIAGNOSIS — E876 Hypokalemia: Secondary | ICD-10-CM | POA: Diagnosis not present

## 2018-07-18 DIAGNOSIS — W19XXXA Unspecified fall, initial encounter: Secondary | ICD-10-CM

## 2018-07-18 DIAGNOSIS — Y9389 Activity, other specified: Secondary | ICD-10-CM | POA: Insufficient documentation

## 2018-07-18 DIAGNOSIS — Y92129 Unspecified place in nursing home as the place of occurrence of the external cause: Secondary | ICD-10-CM | POA: Insufficient documentation

## 2018-07-18 DIAGNOSIS — Z7901 Long term (current) use of anticoagulants: Secondary | ICD-10-CM | POA: Insufficient documentation

## 2018-07-18 DIAGNOSIS — Z79899 Other long term (current) drug therapy: Secondary | ICD-10-CM | POA: Insufficient documentation

## 2018-07-18 DIAGNOSIS — S0990XA Unspecified injury of head, initial encounter: Secondary | ICD-10-CM | POA: Diagnosis not present

## 2018-07-18 DIAGNOSIS — Y999 Unspecified external cause status: Secondary | ICD-10-CM | POA: Insufficient documentation

## 2018-07-18 LAB — URINALYSIS, ROUTINE W REFLEX MICROSCOPIC
Bilirubin Urine: NEGATIVE
Glucose, UA: NEGATIVE mg/dL
Hgb urine dipstick: NEGATIVE
KETONES UR: NEGATIVE mg/dL
Leukocytes, UA: NEGATIVE
NITRITE: NEGATIVE
PH: 6 (ref 5.0–8.0)
PROTEIN: NEGATIVE mg/dL
Specific Gravity, Urine: 1.016 (ref 1.005–1.030)

## 2018-07-18 LAB — COMPREHENSIVE METABOLIC PANEL
ALBUMIN: 2.9 g/dL — AB (ref 3.5–5.0)
ALT: 27 U/L (ref 0–44)
ANION GAP: 10 (ref 5–15)
AST: 41 U/L (ref 15–41)
Alkaline Phosphatase: 129 U/L — ABNORMAL HIGH (ref 38–126)
BILIRUBIN TOTAL: 0.9 mg/dL (ref 0.3–1.2)
BUN: 15 mg/dL (ref 8–23)
CO2: 26 mmol/L (ref 22–32)
Calcium: 8.6 mg/dL — ABNORMAL LOW (ref 8.9–10.3)
Chloride: 108 mmol/L (ref 98–111)
Creatinine, Ser: 0.64 mg/dL (ref 0.44–1.00)
GFR calc Af Amer: >60 mL/min (ref >60)
GFR calc non Af Amer: >60 mL/min (ref >60)
GLUCOSE: 110 mg/dL — AB (ref 70–99)
POTASSIUM: 2.6 mmol/L — AB (ref 3.5–5.1)
SODIUM: 144 mmol/L (ref 135–145)
TOTAL PROTEIN: 6.6 g/dL (ref 6.5–8.1)

## 2018-07-18 LAB — PROTIME-INR
INR: 1.39
PROTHROMBIN TIME: 16.9 s — AB (ref 11.4–15.2)

## 2018-07-18 LAB — DIFFERENTIAL
Basophils Absolute: 0 10*3/uL (ref 0–0.1)
Basophils Relative: 0 %
EOS ABS: 0.1 10*3/uL (ref 0–0.7)
EOS PCT: 1 %
LYMPHS ABS: 2.1 10*3/uL (ref 1.0–3.6)
Lymphocytes Relative: 18 %
Monocytes Absolute: 0.7 10*3/uL (ref 0.2–0.9)
Monocytes Relative: 6 %
NEUTROS PCT: 75 %
Neutro Abs: 8.7 10*3/uL — ABNORMAL HIGH (ref 1.4–6.5)

## 2018-07-18 LAB — CBC
HCT: 35.2 % (ref 35.0–47.0)
Hemoglobin: 12.1 g/dL (ref 12.0–16.0)
MCH: 30.9 pg (ref 26.0–34.0)
MCHC: 34.3 g/dL (ref 32.0–36.0)
MCV: 90.3 fL (ref 80.0–100.0)
Platelets: 193 10*3/uL (ref 150–440)
RBC: 3.9 MIL/uL (ref 3.80–5.20)
RDW: 13.9 % (ref 11.5–14.5)
WBC: 11.7 10*3/uL — ABNORMAL HIGH (ref 3.6–11.0)

## 2018-07-18 LAB — TROPONIN I: Troponin I: <0.03 ng/mL (ref <0.03)

## 2018-07-18 LAB — APTT: aPTT: 31 seconds (ref 24–36)

## 2018-07-18 MED ORDER — POTASSIUM CHLORIDE ER 10 MEQ PO TBCR: 10.0000 meq | EXTENDED_RELEASE_TABLET | Freq: Two times a day (BID) | ORAL | 0 refills | Status: AC

## 2018-07-18 MED ORDER — POTASSIUM CHLORIDE 10 MEQ/100ML IV SOLN
10.0000 meq | INTRAVENOUS | Status: AC
  Administered 2018-07-18 (×2): 10 meq via INTRAVENOUS
  Filled 2018-07-18 (×2): qty 100

## 2018-07-18 MED ORDER — MAGNESIUM SULFATE 2 GM/50ML IV SOLN
2.0000 g | Freq: Once | INTRAVENOUS | Status: AC
  Administered 2018-07-18: 2 g via INTRAVENOUS
  Filled 2018-07-18: qty 50

## 2018-07-18 MED ORDER — LIDOCAINE-EPINEPHRINE-TETRACAINE (LET) SOLUTION
3.0000 mL | Freq: Once | NASAL | Status: AC
  Administered 2018-07-18: 3 mL via TOPICAL
  Filled 2018-07-18: qty 3

## 2018-07-18 MED ORDER — POTASSIUM CHLORIDE CRYS ER 20 MEQ PO TBCR
40.0000 meq | EXTENDED_RELEASE_TABLET | Freq: Once | ORAL | Status: AC
  Administered 2018-07-18: 40 meq via ORAL
  Filled 2018-07-18: qty 2

## 2018-07-18 NOTE — ED Notes (Signed)
Pt arrives from [facility] via [employer] post fall from wheelchair. No family present at this time, Pt poor historian, post stroke several weeks ago. Protocols initiated.

## 2018-07-18 NOTE — Discharge Instructions (Signed)
Please follow up with PCP for repeat BMP early next week.

## 2018-07-18 NOTE — ED Provider Notes (Addendum)
[facility] Emergency Department Provider Note    First MD Initiated Contact with Patient 07/18/18 1556     (approximate)  I have reviewed the triage vital signs and the nursing notes.   HISTORY  Chief Complaint fall Level V Caveat:  AMS - CVA  HPI Ruth Sanchez is a 72 y.o. female who presents to the ER for evaluation of fall.  Patient with very complex recent hospitalization with LVO and CVA now on blood thinners presents from skilled nursing facility after a fall out of her wheelchair.  Did hit her head.  Does have some chronic right-sided deficits and is nonverbal.  No other complaints.  Son is at bedside noted that she is also been favoring her right hand.    Past Medical History:  Diagnosis Date  . Arthritis   . Asthma   . Cancer ([place])   . Hypertension    Family History  Family history unknown: Yes   Past Surgical History:  Procedure Laterality Date  . APPENDECTOMY    . CHOLECYSTECTOMY    . ESOPHAGOGASTRODUODENOSCOPY N/A 11/24/2015   Procedure: ESOPHAGOGASTRODUODENOSCOPY (EGD);  Surgeon: Clarene Essex, MD;  Location: [facility];  Service: Endoscopy;  Laterality: N/A;  . IR CT HEAD LTD  06/30/2018  . IR PERCUTANEOUS ART THROMBECTOMY/INFUSION INTRACRANIAL INC DIAG ANGIO  06/30/2018  . RADIOLOGY WITH ANESTHESIA N/A 06/30/2018   Procedure: IR WITH ANESTHESIA;  Surgeon: Luanne Bras, MD;  Location: [place];  Service: Radiology;  Laterality: N/A;  . TUBAL LIGATION     Patient Active Problem List   Diagnosis Date Noted  . Right groin wound 07/08/2018  . Dysphagia due to recent cerebrovascular accident 07/08/2018  . Leukocytosis 07/08/2018  . Acute ischemic stroke [facility]) s/p attempted mechanical thrombectomy 06/30/2018  . Middle cerebral artery embolism, left 06/30/2018  . Chronic diarrhea 03/29/2017  . Urinary incontinence 08/03/2015  . Essential hypertension 05/13/2015  . Chronic back pain 05/13/2015  . Acute bronchitis 02/23/2013  . Dyspnea  02/23/2013  . Paroxysmal atrial fibrillation ([place]) 02/23/2013  . Arthritis 02/23/2013  . Goiter with tracheal compression and mild stridor 02/23/2013  . Asthma, chronic 02/23/2013  . Hypokalemia 02/23/2013  . Multiple myeloma in remission ([place]) 11/18/2011      Prior to Admission medications   Medication Sig Start Date End Date Taking? Authorizing Provider  albuterol (PROVENTIL HFA;VENTOLIN HFA) 108 (90 Base) MCG/ACT inhaler Inhale 2 puffs into the lungs every 4 (four) hours as needed for wheezing or shortness of breath. 06/24/18   [name], MD  amLODipine (NORVASC) 5 MG tablet Place 1 tablet (5 mg total) into feeding tube daily. 07/09/18   Donzetta Starch, NP  apixaban (ELIQUIS) 5 MG TABS tablet Take 1 tablet (5 mg total) by mouth 2 (two) times daily. 07/08/18   Donzetta Starch, NP  carvedilol (COREG) 12.5 MG tablet Place 1 tablet (12.5 mg total) into feeding tube 2 (two) times daily with a meal. 07/08/18   Donzetta Starch, NP  cetirizine (ZYRTEC) 10 MG tablet Take 1 tablet (10 mg total) by mouth daily. 06/24/18   [name], MD  collagenase (SANTYL) ointment Apply topically daily. 07/08/18   Donzetta Starch, NP  feeding supplement, ENSURE ENLIVE, (ENSURE ENLIVE) LIQD Take 237 mLs by mouth 3 (three) times daily between meals. 07/08/18   Donzetta Starch, NP  Maltodextrin-Xanthan Gum (RESOURCE THICKENUP CLEAR) POWD Take 120 g by mouth as needed (honey thick liquids). 07/08/18   Donzetta Starch, NP  potassium chloride (K-DUR) 10 MEQ tablet Take 1 tablet (10 mEq total) by mouth 2 (two) times daily for 14 days. 07/18/18 08/01/18  Merlyn Lot, MD  senna-docusate (SENOKOT-S) 8.6-50 MG tablet Take 1 tablet by mouth at bedtime as needed for mild constipation. 07/08/18   Donzetta Starch, NP    Allergies Gabapentin and Aspirin    Social History Social History   Tobacco Use  . Smoking status: Never Smoker  . Smokeless tobacco: Never Used  Substance Use Topics  . Alcohol use: No  . Drug use:  No    Review of Systems Patient denies headaches, rhinorrhea, blurry vision, numbness, shortness of breath, chest pain, edema, cough, abdominal pain, nausea, vomiting, diarrhea, dysuria, fevers, rashes or hallucinations unless otherwise stated above in HPI. ____________________________________________   PHYSICAL EXAM:  VITAL SIGNS: Vitals:   07/18/18 1600 07/18/18 1930  BP: 113/78 134/76  Pulse: 96 94  Resp: 18 (!) 22  SpO2: 97% 97%    Constitutional: Alert nods yes and no appropriately Eyes: Conjunctivae are normal.  Head: Atraumatic. Nose: No congestion/rhinnorhea. Mouth/Throat: Mucous membranes are moist.   Neck: No stridor. Painless ROM.  Cardiovascular: Normal rate, regular rhythm. Grossly normal heart sounds.  Good peripheral circulation. Respiratory: Normal respiratory effort.  No retractions. Lungs CTAB. Gastrointestinal: Soft and nontender. No distention. No abdominal bruits. No CVA tenderness. Genitourinary: deferred Musculoskeletal: Right hand with pitting edema and ttp over wrist.  No lower extremity tenderness nor edema.  No joint effusions. Neurologic:right sided weakness, LUE with good strenght. Will follow simple commands Skin:  Skin is warm, dry. No rash noted.  <35m laceration to left eyebrow Psychiatric: Mood and affect are normal.   ____________________________________________   LABS (all labs ordered are listed, but only abnormal results are displayed)  Results for orders placed or performed during the hospital encounter of 07/18/18 (from the past 24 hour(s))  Protime-INR     Status: Abnormal   Collection Time: 07/18/18  3:37 PM  Result Value Ref Range   Prothrombin Time 16.9 (H) 11.4 - 15.2 seconds   INR 1.39   APTT     Status: None   Collection Time: 07/18/18  3:37 PM  Result Value Ref Range   aPTT 31 24 - 36 seconds  CBC     Status: Abnormal   Collection Time: 07/18/18  3:37 PM  Result Value Ref Range   WBC 11.7 (H) 3.6 - 11.0 K/uL   RBC  3.90 3.80 - 5.20 MIL/uL   Hemoglobin 12.1 12.0 - 16.0 g/dL   HCT 35.2 35.0 - 47.0 %   MCV 90.3 80.0 - 100.0 fL   MCH 30.9 26.0 - 34.0 pg   MCHC 34.3 32.0 - 36.0 g/dL   RDW 13.9 11.5 - 14.5 %   Platelets 193 150 - 440 K/uL  Differential     Status: Abnormal   Collection Time: 07/18/18  3:37 PM  Result Value Ref Range   Neutrophils Relative % 75 %   Neutro Abs 8.7 (H) 1.4 - 6.5 K/uL   Lymphocytes Relative 18 %   Lymphs Abs 2.1 1.0 - 3.6 K/uL   Monocytes Relative 6 %   Monocytes Absolute 0.7 0.2 - 0.9 K/uL   Eosinophils Relative 1 %   Eosinophils Absolute 0.1 0 - 0.7 K/uL   Basophils Relative 0 %   Basophils Absolute 0.0 0 - 0.1 K/uL  Comprehensive metabolic panel     Status: Abnormal   Collection Time: 07/18/18  3:37 PM  Result  Value Ref Range   Sodium 144 135 - 145 mmol/L   Potassium 2.6 (LL) 3.5 - 5.1 mmol/L   Chloride 108 98 - 111 mmol/L   CO2 26 22 - 32 mmol/L   Glucose, Bld 110 (H) 70 - 99 mg/dL   BUN 15 8 - 23 mg/dL   Creatinine, Ser 0.64 0.44 - 1.00 mg/dL   Calcium 8.6 (L) 8.9 - 10.3 mg/dL   Total Protein 6.6 6.5 - 8.1 g/dL   Albumin 2.9 (L) 3.5 - 5.0 g/dL   AST 41 15 - 41 U/L   ALT 27 0 - 44 U/L   Alkaline Phosphatase 129 (H) 38 - 126 U/L   Total Bilirubin 0.9 0.3 - 1.2 mg/dL   GFR calc non Af Amer >60 >60 mL/min   GFR calc Af Amer >60 >60 mL/min   Anion gap 10 5 - 15  Troponin I     Status: None   Collection Time: 07/18/18  3:37 PM  Result Value Ref Range   Troponin I <0.03 <0.03 ng/mL  Urinalysis, Routine w reflex microscopic     Status: Abnormal   Collection Time: 07/18/18  3:59 PM  Result Value Ref Range   Color, Urine YELLOW (A) YELLOW   APPearance CLOUDY (A) CLEAR   Specific Gravity, Urine 1.016 1.005 - 1.030   pH 6.0 5.0 - 8.0   Glucose, UA NEGATIVE NEGATIVE mg/dL   Hgb urine dipstick NEGATIVE NEGATIVE   Bilirubin Urine NEGATIVE NEGATIVE   Ketones, ur NEGATIVE NEGATIVE mg/dL   Protein, ur NEGATIVE NEGATIVE mg/dL   Nitrite NEGATIVE NEGATIVE    Leukocytes, UA NEGATIVE NEGATIVE   ____________________________________________  EKG My review and personal interpretation at Time: 15:33   Indication: fall  Rate: 90  Rhythm: afib Axis: normal Other: normal intervals, no stemi ____________________________________________  RADIOLOGY  I personally reviewed all radiographic images ordered to evaluate for the above acute complaints and reviewed radiology reports and findings.  These findings were personally discussed with the patient.  Please see medical record for radiology report.  ____________________________________________   PROCEDURES  Procedure(s) performed:  Marland KitchenMarland KitchenLaceration Repair Date/Time: 07/18/2018 8:07 PM Performed by: Merlyn Lot, MD Authorized by: Merlyn Lot, MD   Consent:    Consent obtained:  Verbal   Consent given by:  Patient   Risks discussed:  Infection, pain, retained foreign body, poor cosmetic result and poor wound healing Anesthesia (see MAR for exact dosages):    Anesthesia method:  Topical application   Topical anesthetic:  LET Laceration details:    Location:  Face   Face location:  R eyebrow   Length (cm):  0.5   Depth (mm):  1 Repair type:    Repair type:  Simple Exploration:    Hemostasis achieved with:  Direct pressure   Wound exploration: entire depth of wound probed and visualized     Contaminated: no   Treatment:    Area cleansed with:  Saline   Amount of cleaning:  Extensive   Irrigation solution:  Sterile saline   Visualized foreign bodies/material removed: no   Skin repair:    Repair method:  Tissue adhesive Approximation:    Approximation:  Close Post-procedure details:    Dressing:  Sterile dressing   Patient tolerance of procedure:  Tolerated well, no immediate complications      Critical Care performed: no ____________________________________________   INITIAL IMPRESSION / ASSESSMENT AND PLAN / ED COURSE  Pertinent labs & imaging results that were  available during my care  of the patient were reviewed by me and considered in my medical decision making (see chart for details).   DDX: Dehydration, lecture light abnormality, anemia laceration, seizure, subdural, IPH  THAMARA LEGER is a 72 y.o. who presents to the ED with symptoms as described above.  Does have small laceration as described above repaired with Dermabond.  Blood work sent for the above differential does show evidence of hypokalemia.  Will replete with both IV and oral potassium as well as IV magnesium.  Do suspect nutritional deficiency.  Will order CT imaging given head injury on blood thinners.  Clinical Course as of Jul 18 2008  Fri Jul 18, 2018  1724 CT head with no evidence of hemorrhage.   [PR]    Clinical Course User Index [PR] Merlyn Lot, MD   Patient remained stable.  Appropriate for discharge back to facility.  As part of my medical decision making, I reviewed the following data within the [facility] notes reviewed and incorporated, Labs reviewed, notes from prior ED visits.   ____________________________________________   FINAL CLINICAL IMPRESSION(S) / ED DIAGNOSES  Final diagnoses:  Fall, initial encounter  Hypokalemia  Laceration of right eyebrow, initial encounter      NEW MEDICATIONS STARTED DURING THIS VISIT:  New Prescriptions   POTASSIUM CHLORIDE (K-DUR) 10 MEQ TABLET    Take 1 tablet (10 mEq total) by mouth 2 (two) times daily for 14 days.     Note:  This document was prepared using Dragon voice recognition software and may include unintentional dictation errors.    Merlyn Lot, MD 07/18/18 Despina Pole    Merlyn Lot, MD 07/18/18 2009

## 2018-07-18 NOTE — ED Notes (Signed)
Pt cleaned up and repositioned in bed. Pt had soaked through diaper and had large light brown formed stool. Pt resting comfortably at this time.

## 2018-07-22 ENCOUNTER — Ambulatory Visit: Payer: Medicare Other | Admitting: Internal Medicine

## 2018-07-22 ENCOUNTER — Ambulatory Visit: Payer: Medicare Other

## 2018-07-22 DIAGNOSIS — I69320 Aphasia following cerebral infarction: Secondary | ICD-10-CM | POA: Diagnosis not present

## 2018-07-22 DIAGNOSIS — R419 Unspecified symptoms and signs involving cognitive functions and awareness: Secondary | ICD-10-CM | POA: Diagnosis not present

## 2018-07-22 DIAGNOSIS — I10 Essential (primary) hypertension: Secondary | ICD-10-CM | POA: Diagnosis not present

## 2018-07-22 DIAGNOSIS — M25511 Pain in right shoulder: Secondary | ICD-10-CM | POA: Diagnosis not present

## 2018-07-22 DIAGNOSIS — Z23 Encounter for immunization: Secondary | ICD-10-CM | POA: Diagnosis not present

## 2018-07-22 DIAGNOSIS — I4891 Unspecified atrial fibrillation: Secondary | ICD-10-CM | POA: Diagnosis not present

## 2018-07-22 DIAGNOSIS — I6930 Unspecified sequelae of cerebral infarction: Secondary | ICD-10-CM | POA: Diagnosis not present

## 2018-07-22 DIAGNOSIS — I480 Paroxysmal atrial fibrillation: Secondary | ICD-10-CM | POA: Diagnosis not present

## 2018-07-22 DIAGNOSIS — R1312 Dysphagia, oropharyngeal phase: Secondary | ICD-10-CM | POA: Diagnosis not present

## 2018-07-22 DIAGNOSIS — I69391 Dysphagia following cerebral infarction: Secondary | ICD-10-CM | POA: Diagnosis not present

## 2018-07-22 DIAGNOSIS — C9001 Multiple myeloma in remission: Secondary | ICD-10-CM | POA: Diagnosis not present

## 2018-07-22 DIAGNOSIS — I69351 Hemiplegia and hemiparesis following cerebral infarction affecting right dominant side: Secondary | ICD-10-CM | POA: Diagnosis not present

## 2018-07-22 DIAGNOSIS — E876 Hypokalemia: Secondary | ICD-10-CM | POA: Diagnosis not present

## 2018-07-22 DIAGNOSIS — F331 Major depressive disorder, recurrent, moderate: Secondary | ICD-10-CM | POA: Diagnosis not present

## 2018-07-28 DIAGNOSIS — I4891 Unspecified atrial fibrillation: Secondary | ICD-10-CM | POA: Diagnosis not present

## 2018-07-28 DIAGNOSIS — I6930 Unspecified sequelae of cerebral infarction: Secondary | ICD-10-CM | POA: Diagnosis not present

## 2018-07-28 DIAGNOSIS — M25511 Pain in right shoulder: Secondary | ICD-10-CM | POA: Diagnosis not present

## 2018-07-28 DIAGNOSIS — I10 Essential (primary) hypertension: Secondary | ICD-10-CM | POA: Diagnosis not present

## 2018-08-13 ENCOUNTER — Ambulatory Visit: Payer: Self-pay | Admitting: Adult Health

## 2018-08-20 ENCOUNTER — Other Ambulatory Visit: Payer: Medicare Other

## 2018-08-26 ENCOUNTER — Telehealth: Payer: Self-pay | Admitting: Internal Medicine

## 2018-08-26 ENCOUNTER — Inpatient Hospital Stay: Payer: Medicare Other

## 2018-08-26 ENCOUNTER — Encounter: Payer: Self-pay | Admitting: Oncology

## 2018-08-26 ENCOUNTER — Inpatient Hospital Stay: Payer: Medicare Other | Attending: Oncology | Admitting: Oncology

## 2018-08-26 VITALS — BP 120/83 | HR 96 | Temp 97.5°F | Resp 20 | Ht 60.0 in

## 2018-08-26 DIAGNOSIS — C9001 Multiple myeloma in remission: Secondary | ICD-10-CM | POA: Diagnosis not present

## 2018-08-26 DIAGNOSIS — Z8673 Personal history of transient ischemic attack (TIA), and cerebral infarction without residual deficits: Secondary | ICD-10-CM | POA: Diagnosis not present

## 2018-08-26 DIAGNOSIS — R269 Unspecified abnormalities of gait and mobility: Secondary | ICD-10-CM | POA: Diagnosis not present

## 2018-08-26 DIAGNOSIS — Z7401 Bed confinement status: Secondary | ICD-10-CM | POA: Diagnosis not present

## 2018-08-26 DIAGNOSIS — I10 Essential (primary) hypertension: Secondary | ICD-10-CM | POA: Insufficient documentation

## 2018-08-26 LAB — CBC WITH DIFFERENTIAL (CANCER CENTER ONLY)
ABS IMMATURE GRANULOCYTES: 0.03 10*3/uL (ref 0.00–0.07)
Basophils Absolute: 0 10*3/uL (ref 0.0–0.1)
Basophils Relative: 0 %
EOS PCT: 2 %
Eosinophils Absolute: 0.1 10*3/uL (ref 0.0–0.5)
HEMATOCRIT: 41.5 % (ref 36.0–46.0)
Hemoglobin: 13 g/dL (ref 12.0–15.0)
Immature Granulocytes: 0 %
LYMPHS ABS: 2.2 10*3/uL (ref 0.7–4.0)
LYMPHS PCT: 25 %
MCH: 29 pg (ref 26.0–34.0)
MCHC: 31.3 g/dL (ref 30.0–36.0)
MCV: 92.4 fL (ref 80.0–100.0)
MONOS PCT: 6 %
Monocytes Absolute: 0.5 10*3/uL (ref 0.1–1.0)
NEUTROS ABS: 5.7 10*3/uL (ref 1.7–7.7)
Neutrophils Relative %: 67 %
Platelet Count: 189 10*3/uL (ref 150–400)
RBC: 4.49 MIL/uL (ref 3.87–5.11)
RDW: 13.3 % (ref 11.5–15.5)
WBC: 8.6 10*3/uL (ref 4.0–10.5)
nRBC: 0 % (ref 0.0–0.2)

## 2018-08-26 LAB — CMP (CANCER CENTER ONLY)
ALT: 16 U/L (ref 0–44)
AST: 25 U/L (ref 15–41)
Albumin: 3.2 g/dL — ABNORMAL LOW (ref 3.5–5.0)
Alkaline Phosphatase: 126 U/L (ref 38–126)
Anion gap: 6 (ref 5–15)
BUN: 12 mg/dL (ref 8–23)
CO2: 27 mmol/L (ref 22–32)
Calcium: 9.6 mg/dL (ref 8.9–10.3)
Chloride: 108 mmol/L (ref 98–111)
Creatinine: 0.83 mg/dL (ref 0.44–1.00)
GFR, Est AFR Am: >60 mL/min
GFR, Estimated: >60 mL/min
Glucose, Bld: 109 mg/dL — ABNORMAL HIGH (ref 70–99)
Potassium: 3.6 mmol/L (ref 3.5–5.1)
Sodium: 141 mmol/L (ref 135–145)
Total Bilirubin: 1.1 mg/dL (ref 0.3–1.2)
Total Protein: 7.7 g/dL (ref 6.5–8.1)

## 2018-08-26 LAB — LACTATE DEHYDROGENASE: LDH: 169 U/L (ref 98–192)

## 2018-08-26 MED ORDER — ZOLEDRONIC ACID 4 MG/100ML IV SOLN
4.0000 mg | Freq: Once | INTRAVENOUS | Status: AC
  Administered 2018-08-26: 4 mg via INTRAVENOUS
  Filled 2018-08-26: qty 100

## 2018-08-26 MED ORDER — SODIUM CHLORIDE 0.9 % IV SOLN
INTRAVENOUS | Status: DC
  Administered 2018-08-26: 12:00:00 via INTRAVENOUS
  Filled 2018-08-26: qty 250

## 2018-08-26 NOTE — Assessment & Plan Note (Signed)
This is a very pleasant 72 year old African American female with history of multiple myeloma status post systemic chemotherapy with Revlimid and Decadron followed by this peripheral blood stem cell transplant and has been observation since June of 2009 with no significant evidence for disease progression.  Since her last visit, the patient has had a major CVA.  She is now nonverbal and has right hemiparesis.  She arrived on a stretcher today.  The patient was seen with Dr. Julien Nordmann.  CBC and CMET were reviewed and remain stable.  Myeloma panel is pending.  Continue on observation.  We discussed with the patient and her family member that we can proceed with Zometa today.  We recommend that she continue to work with therapy to get stronger.  We will plan to see her back in 6 months for evaluation and repeat lab work.  We will reevaluate the patient in 6 months to determine if Zometa is still indicated.  All questions were answered. The patient knows to call the clinic with any problems, questions or concerns. We can certainly see the patient much sooner if necessary.

## 2018-08-26 NOTE — Telephone Encounter (Signed)
Appts scheduled letter/calendar mailed per 11/5 los °

## 2018-08-26 NOTE — Progress Notes (Signed)
[facility] OFFICE PROGRESS NOTE  [name], MD [place] [address]  DIAGNOSIS: Multiple myeloma, IgG kappa subtype diagnosed in October 2008.   PRIOR THERAPY:  1. Status post radiotherapy to the lower lumbar spine and left pelvic area under the care of Dr. Sondra Come. The patient received a total dose of 3000 cGy. in 15 fractions between 10/14/2007 through 11/07/2007  2.Status post 6 cycles of systemic chemotherapy with Revlimid and Decadron. Last dose given April 2009.  3. status post autologous peripheral blood stem cell transplant on 04/01/2008 under the care of Dr. Valarie Merino at [facility]   CURRENT THERAPY: Zometa 4 mg IV given every 3 months.  INTERVAL HISTORY: ANALYSSE QUINONEZ 72 y.o. female returns for routine follow-up visit accompanied by a family member.  Since her last visit, the patient had a major CVA and is now residing in a skilled nursing facility.  The patient is nonverbal.  She has right-sided hemiparesis.  Unable to obtain a full review of systems from the patient.  Per the family member, she has not noticed any pain.  The patient eats a fair and has thickened liquids.  The family member has not noted any shortness of breath or cough.  They have not noticed any nausea, vomiting, diarrhea, or constipation.  The patient is here for evaluation and repeat lab work.  MEDICAL HISTORY: Past Medical History:  Diagnosis Date  . Arthritis   . Asthma   . Cancer ([place])   . Hypertension     ALLERGIES:  is allergic to gabapentin and aspirin.  MEDICATIONS:  Current Outpatient Medications  Medication Sig Dispense Refill  . albuterol (PROVENTIL HFA;VENTOLIN HFA) 108 (90 Base) MCG/ACT inhaler Inhale 2 puffs into the lungs every 4 (four) hours as needed for wheezing or shortness of breath. 1 Inhaler 3  . amLODipine (NORVASC) 5 MG tablet Place 1 tablet (5 mg total) into feeding tube daily.    Marland Kitchen apixaban (ELIQUIS) 5 MG TABS tablet Take 1  tablet (5 mg total) by mouth 2 (two) times daily. 60 tablet   . carvedilol (COREG) 12.5 MG tablet Place 1 tablet (12.5 mg total) into feeding tube 2 (two) times daily with a meal.    . cetirizine (ZYRTEC) 10 MG tablet Take 1 tablet (10 mg total) by mouth daily. 30 tablet 2  . collagenase (SANTYL) ointment Apply topically daily. 15 g 0  . feeding supplement, ENSURE ENLIVE, (ENSURE ENLIVE) LIQD Take 237 mLs by mouth 3 (three) times daily between meals. 237 mL 12  . Maltodextrin-Xanthan Gum (RESOURCE THICKENUP CLEAR) POWD Take 120 g by mouth as needed (honey thick liquids).    . potassium chloride (K-DUR) 10 MEQ tablet Take 1 tablet (10 mEq total) by mouth 2 (two) times daily for 14 days. 28 tablet 0  . senna-docusate (SENOKOT-S) 8.6-50 MG tablet Take 1 tablet by mouth at bedtime as needed for mild constipation.     Current Facility-Administered Medications  Medication Dose Route Frequency Provider Last Rate Last Dose  . 0.9 %  sodium chloride infusion   Intravenous Continuous Mikey Bussing R, NP 20 mL/hr at 08/26/18 1222    . Zoledronic Acid (ZOMETA) IVPB 4 mg  4 mg Intravenous Once Curt Bears, MD        SURGICAL HISTORY:  Past Surgical History:  Procedure Laterality Date  . APPENDECTOMY    . CHOLECYSTECTOMY    . ESOPHAGOGASTRODUODENOSCOPY N/A 11/24/2015   Procedure: ESOPHAGOGASTRODUODENOSCOPY (EGD);  Surgeon: Clarene Essex,  MD;  Location: [facility];  Service: Endoscopy;  Laterality: N/A;  . IR CT HEAD LTD  06/30/2018  . IR PERCUTANEOUS ART THROMBECTOMY/INFUSION INTRACRANIAL INC DIAG ANGIO  06/30/2018  . RADIOLOGY WITH ANESTHESIA N/A 06/30/2018   Procedure: IR WITH ANESTHESIA;  Surgeon: Luanne Bras, MD;  Location: [place];  Service: Radiology;  Laterality: N/A;  . TUBAL LIGATION      REVIEW OF SYSTEMS:   Review of Systems  Full review of systems could not be obtained because the patient is nonverbal.  See HPI.  PHYSICAL EXAMINATION:  Blood pressure 120/83, pulse 96, temperature  (!) 97.5 F (36.4 C), temperature source Oral, resp. rate 20, height 5' (1.524 m), SpO2 99 %.  ECOG PERFORMANCE STATUS: 2 - Symptomatic, <50% confined to bed  Physical Exam  Constitutional: Alert. No distress.  HENT:  Head: Normocephalic and atraumatic.  Mouth/Throat: Oropharynx is clear and moist. No oropharyngeal exudate.  Eyes: Conjunctivae are normal. Right eye exhibits no discharge. Left eye exhibits no discharge. No scleral icterus.  Neck: Neck supple.  Cardiovascular: Normal rate, regular rhythm, normal heart sounds and intact distal pulses.   Pulmonary/Chest: Effort normal and breath sounds normal. No respiratory distress. No wheezes. No rales.  Abdominal: Soft. Bowel sounds are normal. Exhibits no distension and no mass. There is no tenderness.  Musculoskeletal: Right-sided hemiparesis.  Mild edema noted to the right hand and arm. Lymphadenopathy:    No cervical adenopathy.  Neurological: Exhibits normal muscle tone. Skin: Skin is warm and dry. No rash noted. Not diaphoretic. No erythema. No pallor.  Vitals reviewed.  LABORATORY DATA: Lab Results  Component Value Date   WBC 8.6 08/26/2018   HGB 13.0 08/26/2018   HCT 41.5 08/26/2018   MCV 92.4 08/26/2018   PLT 189 08/26/2018      Chemistry      Component Value Date/Time   NA 141 08/26/2018 1106   NA 146 (H) 09/27/2017 1528   NA 143 05/28/2017 1141   K 3.6 08/26/2018 1106   K 3.5 05/28/2017 1141   CL 108 08/26/2018 1106   CL 104 01/19/2013 1058   CO2 27 08/26/2018 1106   CO2 28 05/28/2017 1141   BUN 12 08/26/2018 1106   BUN 15 09/27/2017 1528   BUN 10.4 05/28/2017 1141   CREATININE 0.83 08/26/2018 1106   CREATININE 0.8 05/28/2017 1141      Component Value Date/Time   CALCIUM 9.6 08/26/2018 1106   CALCIUM 9.7 05/28/2017 1141   ALKPHOS 126 08/26/2018 1106   ALKPHOS 122 05/28/2017 1141   AST 25 08/26/2018 1106   AST 18 05/28/2017 1141   ALT 16 08/26/2018 1106   ALT 14 05/28/2017 1141   BILITOT 1.1  08/26/2018 1106   BILITOT 0.86 05/28/2017 1141       RADIOGRAPHIC STUDIES:  No results found.   ASSESSMENT/PLAN:  Multiple myeloma in remission [facility]) This is a very pleasant 72 year old African American female with history of multiple myeloma status post systemic chemotherapy with Revlimid and Decadron followed by this peripheral blood stem cell transplant and has been observation since June of 2009 with no significant evidence for disease progression.  Since her last visit, the patient has had a major CVA.  She is now nonverbal and has right hemiparesis.  She arrived on a stretcher today.  The patient was seen with Dr. Julien Nordmann.  CBC and CMET were reviewed and remain stable.  Myeloma panel is pending.  Continue on observation.  We discussed with the  patient and her family member that we can proceed with Zometa today.  We recommend that she continue to work with therapy to get stronger.  We will plan to see her back in 6 months for evaluation and repeat lab work.  We will reevaluate the patient in 6 months to determine if Zometa is still indicated.  All questions were answered. The patient knows to call the clinic with any problems, questions or concerns. We can certainly see the patient much sooner if necessary.   Orders Placed This Encounter  Procedures  . CBC with Differential (Cancer Center Only)    Standing Status:   Future    Standing Expiration Date:   08/27/2019  . CMP ([place] only)    Standing Status:   Future    Standing Expiration Date:   08/27/2019  . Beta 2 microglobulin, serum    Standing Status:   Future    Standing Expiration Date:   08/26/2019  . Lactate dehydrogenase    Standing Status:   Future    Standing Expiration Date:   08/27/2019  . IgG, IgA, IgM    Standing Status:   Future    Standing Expiration Date:   08/26/2019  . Kappa/lambda light chains    Standing Status:   Future    Standing Expiration Date:   08/26/2019     Mikey Bussing, DNP, AGPCNP-BC,  AOCNP 08/26/18   ADDENDUM: Hematology/Oncology Attending: I had a face-to-face encounter with the patient.  I recommended her care plan.  This is a very pleasant 71 years old African-American female with history of multiple myeloma status post treatment several years ago and has been on observation for many years with no concerning findings for disease progression.  Unfortunately the patient had a recent a stroke and she is currently aphasic and came on a stretcher.  She has been coming to the cancer center for Zometa infusion every 3 months. Myeloma panel performed recently were unremarkable for any progression. I recommended for the patient to proceed with her dose of Zometa today as scheduled but we will hold any further treatment until the patient feels much better than her current condition. We will see her back for follow-up visit in 6 months if her condition permits. The family were advised to call if we can help them in any way in the interval.  Disclaimer: This note was dictated with voice recognition software. Similar sounding words can inadvertently be transcribed and may be missed upon review. Eilleen Kempf, MD 08/27/18

## 2018-08-26 NOTE — Patient Instructions (Signed)

## 2018-08-27 LAB — KAPPA/LAMBDA LIGHT CHAINS
Kappa free light chain: 34.5 mg/L — ABNORMAL HIGH (ref 3.3–19.4)
Kappa, lambda light chain ratio: 1.02 (ref 0.26–1.65)
LAMDA FREE LIGHT CHAINS: 33.8 mg/L — AB (ref 5.7–26.3)

## 2018-08-27 LAB — IGG, IGA, IGM
IGA: 339 mg/dL (ref 64–422)
IGG (IMMUNOGLOBIN G), SERUM: 1567 mg/dL (ref 700–1600)
IGM (IMMUNOGLOBULIN M), SRM: 210 mg/dL (ref 26–217)

## 2018-08-27 LAB — BETA 2 MICROGLOBULIN, SERUM: Beta-2 Microglobulin: 2.1 mg/L (ref 0.6–2.4)

## 2018-08-28 ENCOUNTER — Telehealth: Payer: Self-pay | Admitting: Internal Medicine

## 2018-08-28 NOTE — Telephone Encounter (Signed)
Cancelled appt per 11/7 sch message - pt rehab center aware of cancellation.

## 2018-09-15 DIAGNOSIS — B351 Tinea unguium: Secondary | ICD-10-CM | POA: Diagnosis not present

## 2018-09-15 DIAGNOSIS — I739 Peripheral vascular disease, unspecified: Secondary | ICD-10-CM | POA: Diagnosis not present

## 2018-09-15 DIAGNOSIS — L603 Nail dystrophy: Secondary | ICD-10-CM | POA: Diagnosis not present

## 2018-09-23 ENCOUNTER — Ambulatory Visit: Payer: Medicare Other | Admitting: Family Medicine

## 2018-09-24 DIAGNOSIS — E119 Type 2 diabetes mellitus without complications: Secondary | ICD-10-CM | POA: Diagnosis not present

## 2018-09-24 DIAGNOSIS — H2513 Age-related nuclear cataract, bilateral: Secondary | ICD-10-CM | POA: Diagnosis not present

## 2018-09-29 ENCOUNTER — Other Ambulatory Visit: Payer: Self-pay

## 2018-09-29 NOTE — Patient Outreach (Signed)
Telephone outreach to patient to obtain mRs was successfully completed. mRs= 5. Spoke with caretaker at nursing home to obtain score.

## 2018-09-30 DIAGNOSIS — Z789 Other specified health status: Secondary | ICD-10-CM | POA: Diagnosis not present

## 2018-09-30 DIAGNOSIS — I10 Essential (primary) hypertension: Secondary | ICD-10-CM | POA: Diagnosis not present

## 2018-09-30 DIAGNOSIS — I482 Chronic atrial fibrillation, unspecified: Secondary | ICD-10-CM | POA: Diagnosis not present

## 2018-09-30 DIAGNOSIS — C9000 Multiple myeloma not having achieved remission: Secondary | ICD-10-CM | POA: Diagnosis not present

## 2018-09-30 DIAGNOSIS — G8191 Hemiplegia, unspecified affecting right dominant side: Secondary | ICD-10-CM | POA: Diagnosis not present

## 2018-09-30 DIAGNOSIS — I69320 Aphasia following cerebral infarction: Secondary | ICD-10-CM | POA: Diagnosis not present

## 2018-09-30 DIAGNOSIS — Z0000 Encounter for general adult medical examination without abnormal findings: Secondary | ICD-10-CM | POA: Diagnosis not present

## 2018-10-14 ENCOUNTER — Ambulatory Visit: Payer: Medicare Other

## 2018-10-22 DIAGNOSIS — D649 Anemia, unspecified: Secondary | ICD-10-CM | POA: Diagnosis not present

## 2018-10-22 DIAGNOSIS — Z79899 Other long term (current) drug therapy: Secondary | ICD-10-CM | POA: Diagnosis not present

## 2018-11-28 DIAGNOSIS — F331 Major depressive disorder, recurrent, moderate: Secondary | ICD-10-CM | POA: Diagnosis not present

## 2018-12-04 DIAGNOSIS — I739 Peripheral vascular disease, unspecified: Secondary | ICD-10-CM | POA: Diagnosis not present

## 2018-12-04 DIAGNOSIS — B351 Tinea unguium: Secondary | ICD-10-CM | POA: Diagnosis not present

## 2018-12-17 DIAGNOSIS — I69320 Aphasia following cerebral infarction: Secondary | ICD-10-CM | POA: Diagnosis not present

## 2018-12-17 DIAGNOSIS — I10 Essential (primary) hypertension: Secondary | ICD-10-CM | POA: Diagnosis not present

## 2018-12-17 DIAGNOSIS — D649 Anemia, unspecified: Secondary | ICD-10-CM | POA: Diagnosis not present

## 2018-12-17 DIAGNOSIS — G8191 Hemiplegia, unspecified affecting right dominant side: Secondary | ICD-10-CM | POA: Diagnosis not present

## 2018-12-17 DIAGNOSIS — I482 Chronic atrial fibrillation, unspecified: Secondary | ICD-10-CM | POA: Diagnosis not present

## 2018-12-17 DIAGNOSIS — C9000 Multiple myeloma not having achieved remission: Secondary | ICD-10-CM | POA: Diagnosis not present

## 2018-12-17 DIAGNOSIS — Z789 Other specified health status: Secondary | ICD-10-CM | POA: Diagnosis not present

## 2019-01-13 ENCOUNTER — Ambulatory Visit: Payer: Medicare Other

## 2019-02-13 DIAGNOSIS — I69320 Aphasia following cerebral infarction: Secondary | ICD-10-CM | POA: Diagnosis not present

## 2019-02-13 DIAGNOSIS — Z789 Other specified health status: Secondary | ICD-10-CM | POA: Diagnosis not present

## 2019-02-13 DIAGNOSIS — I482 Chronic atrial fibrillation, unspecified: Secondary | ICD-10-CM | POA: Diagnosis not present

## 2019-02-13 DIAGNOSIS — C9000 Multiple myeloma not having achieved remission: Secondary | ICD-10-CM | POA: Diagnosis not present

## 2019-02-13 DIAGNOSIS — G8191 Hemiplegia, unspecified affecting right dominant side: Secondary | ICD-10-CM | POA: Diagnosis not present

## 2019-02-20 DIAGNOSIS — F331 Major depressive disorder, recurrent, moderate: Secondary | ICD-10-CM | POA: Diagnosis not present

## 2019-02-24 ENCOUNTER — Inpatient Hospital Stay: Payer: Medicare Other | Attending: Family Medicine

## 2019-02-24 ENCOUNTER — Inpatient Hospital Stay: Payer: Medicare Other | Admitting: Internal Medicine

## 2019-03-23 DIAGNOSIS — G8191 Hemiplegia, unspecified affecting right dominant side: Secondary | ICD-10-CM | POA: Diagnosis not present

## 2019-03-23 DIAGNOSIS — I482 Chronic atrial fibrillation, unspecified: Secondary | ICD-10-CM | POA: Diagnosis not present

## 2019-03-23 DIAGNOSIS — Z789 Other specified health status: Secondary | ICD-10-CM | POA: Diagnosis not present

## 2019-03-23 DIAGNOSIS — I69320 Aphasia following cerebral infarction: Secondary | ICD-10-CM | POA: Diagnosis not present

## 2019-03-27 DIAGNOSIS — R1312 Dysphagia, oropharyngeal phase: Secondary | ICD-10-CM | POA: Diagnosis not present

## 2019-03-27 DIAGNOSIS — I69391 Dysphagia following cerebral infarction: Secondary | ICD-10-CM | POA: Diagnosis not present

## 2019-03-30 DIAGNOSIS — I69391 Dysphagia following cerebral infarction: Secondary | ICD-10-CM | POA: Diagnosis not present

## 2019-03-30 DIAGNOSIS — R1312 Dysphagia, oropharyngeal phase: Secondary | ICD-10-CM | POA: Diagnosis not present

## 2019-04-01 DIAGNOSIS — R1312 Dysphagia, oropharyngeal phase: Secondary | ICD-10-CM | POA: Diagnosis not present

## 2019-04-01 DIAGNOSIS — I69391 Dysphagia following cerebral infarction: Secondary | ICD-10-CM | POA: Diagnosis not present

## 2019-04-03 DIAGNOSIS — R1312 Dysphagia, oropharyngeal phase: Secondary | ICD-10-CM | POA: Diagnosis not present

## 2019-04-03 DIAGNOSIS — I69391 Dysphagia following cerebral infarction: Secondary | ICD-10-CM | POA: Diagnosis not present

## 2019-04-08 DIAGNOSIS — R1312 Dysphagia, oropharyngeal phase: Secondary | ICD-10-CM | POA: Diagnosis not present

## 2019-04-08 DIAGNOSIS — I69391 Dysphagia following cerebral infarction: Secondary | ICD-10-CM | POA: Diagnosis not present

## 2019-04-09 DIAGNOSIS — I69391 Dysphagia following cerebral infarction: Secondary | ICD-10-CM | POA: Diagnosis not present

## 2019-04-09 DIAGNOSIS — R1312 Dysphagia, oropharyngeal phase: Secondary | ICD-10-CM | POA: Diagnosis not present

## 2019-04-10 DIAGNOSIS — I69391 Dysphagia following cerebral infarction: Secondary | ICD-10-CM | POA: Diagnosis not present

## 2019-04-10 DIAGNOSIS — R1312 Dysphagia, oropharyngeal phase: Secondary | ICD-10-CM | POA: Diagnosis not present

## 2019-04-13 DIAGNOSIS — R1312 Dysphagia, oropharyngeal phase: Secondary | ICD-10-CM | POA: Diagnosis not present

## 2019-04-13 DIAGNOSIS — I69391 Dysphagia following cerebral infarction: Secondary | ICD-10-CM | POA: Diagnosis not present

## 2019-04-14 DIAGNOSIS — R1312 Dysphagia, oropharyngeal phase: Secondary | ICD-10-CM | POA: Diagnosis not present

## 2019-04-14 DIAGNOSIS — I69391 Dysphagia following cerebral infarction: Secondary | ICD-10-CM | POA: Diagnosis not present

## 2019-04-20 DIAGNOSIS — I69391 Dysphagia following cerebral infarction: Secondary | ICD-10-CM | POA: Diagnosis not present

## 2019-04-20 DIAGNOSIS — R1312 Dysphagia, oropharyngeal phase: Secondary | ICD-10-CM | POA: Diagnosis not present

## 2019-04-21 DIAGNOSIS — R1312 Dysphagia, oropharyngeal phase: Secondary | ICD-10-CM | POA: Diagnosis not present

## 2019-04-21 DIAGNOSIS — I69391 Dysphagia following cerebral infarction: Secondary | ICD-10-CM | POA: Diagnosis not present

## 2019-04-22 DIAGNOSIS — I69391 Dysphagia following cerebral infarction: Secondary | ICD-10-CM | POA: Diagnosis not present

## 2019-04-22 DIAGNOSIS — R1312 Dysphagia, oropharyngeal phase: Secondary | ICD-10-CM | POA: Diagnosis not present

## 2019-04-23 DIAGNOSIS — Z20828 Contact with and (suspected) exposure to other viral communicable diseases: Secondary | ICD-10-CM | POA: Diagnosis not present

## 2019-04-24 DIAGNOSIS — G8191 Hemiplegia, unspecified affecting right dominant side: Secondary | ICD-10-CM | POA: Diagnosis not present

## 2019-04-24 DIAGNOSIS — I482 Chronic atrial fibrillation, unspecified: Secondary | ICD-10-CM | POA: Diagnosis not present

## 2019-04-24 DIAGNOSIS — I69320 Aphasia following cerebral infarction: Secondary | ICD-10-CM | POA: Diagnosis not present

## 2019-04-24 DIAGNOSIS — Z789 Other specified health status: Secondary | ICD-10-CM | POA: Diagnosis not present

## 2019-04-24 DIAGNOSIS — C9000 Multiple myeloma not having achieved remission: Secondary | ICD-10-CM | POA: Diagnosis not present

## 2019-05-06 DIAGNOSIS — Z03818 Encounter for observation for suspected exposure to other biological agents ruled out: Secondary | ICD-10-CM | POA: Diagnosis not present

## 2019-05-22 ENCOUNTER — Other Ambulatory Visit: Payer: Self-pay

## 2019-05-25 DIAGNOSIS — I10 Essential (primary) hypertension: Secondary | ICD-10-CM | POA: Diagnosis not present

## 2019-05-26 DIAGNOSIS — I10 Essential (primary) hypertension: Secondary | ICD-10-CM | POA: Diagnosis not present

## 2019-05-26 DIAGNOSIS — D649 Anemia, unspecified: Secondary | ICD-10-CM | POA: Diagnosis not present

## 2019-07-24 DIAGNOSIS — F331 Major depressive disorder, recurrent, moderate: Secondary | ICD-10-CM | POA: Diagnosis not present

## 2019-07-31 DIAGNOSIS — Z789 Other specified health status: Secondary | ICD-10-CM | POA: Diagnosis not present

## 2019-07-31 DIAGNOSIS — I482 Chronic atrial fibrillation, unspecified: Secondary | ICD-10-CM | POA: Diagnosis not present

## 2019-07-31 DIAGNOSIS — I69320 Aphasia following cerebral infarction: Secondary | ICD-10-CM | POA: Diagnosis not present

## 2019-07-31 DIAGNOSIS — G8191 Hemiplegia, unspecified affecting right dominant side: Secondary | ICD-10-CM | POA: Diagnosis not present

## 2019-07-31 DIAGNOSIS — C9000 Multiple myeloma not having achieved remission: Secondary | ICD-10-CM | POA: Diagnosis not present

## 2019-08-03 DIAGNOSIS — R1312 Dysphagia, oropharyngeal phase: Secondary | ICD-10-CM | POA: Diagnosis not present

## 2019-08-03 DIAGNOSIS — Z23 Encounter for immunization: Secondary | ICD-10-CM | POA: Diagnosis not present

## 2019-08-31 DIAGNOSIS — R112 Nausea with vomiting, unspecified: Secondary | ICD-10-CM | POA: Diagnosis not present

## 2019-08-31 DIAGNOSIS — Z20828 Contact with and (suspected) exposure to other viral communicable diseases: Secondary | ICD-10-CM | POA: Diagnosis not present

## 2019-08-31 DIAGNOSIS — Z79899 Other long term (current) drug therapy: Secondary | ICD-10-CM | POA: Diagnosis not present

## 2019-08-31 DIAGNOSIS — D649 Anemia, unspecified: Secondary | ICD-10-CM | POA: Diagnosis not present

## 2019-08-31 DIAGNOSIS — I10 Essential (primary) hypertension: Secondary | ICD-10-CM | POA: Diagnosis not present

## 2019-09-04 DIAGNOSIS — D72829 Elevated white blood cell count, unspecified: Secondary | ICD-10-CM | POA: Diagnosis not present

## 2019-09-05 DIAGNOSIS — D72828 Other elevated white blood cell count: Secondary | ICD-10-CM | POA: Diagnosis not present

## 2019-09-05 DIAGNOSIS — D649 Anemia, unspecified: Secondary | ICD-10-CM | POA: Diagnosis not present

## 2020-07-22 IMAGING — CT CT HEAD W/O CM
3 of 4 series · 15 of 47 positions shown, 18 images · non-contrast
Comparison: CT scan June 30, 2018

CLINICAL DATA: Recent infarct.  Fall.  Swelling to forehead.

EXAM:
CT HEAD WITHOUT CONTRAST
TECHNIQUE: Contiguous axial images were obtained from the base of the skull
through the vertex without intravenous contrast.

[Series 3: head wo · axial · 0.40mm/px · z∈[+442,+562]mm · 9 of 30 slices shown, 12 images]
[im 3/30  brain]
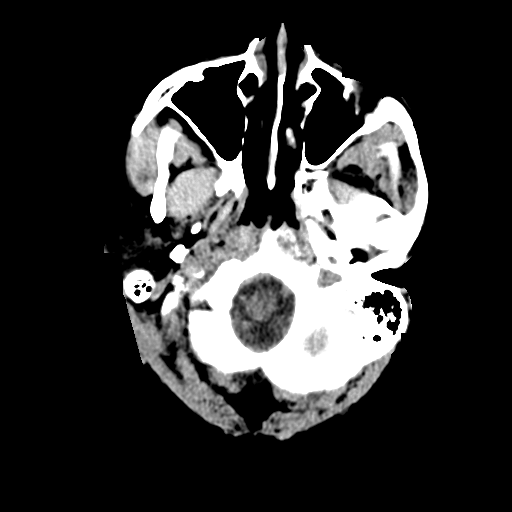
[im 3/30  bone]
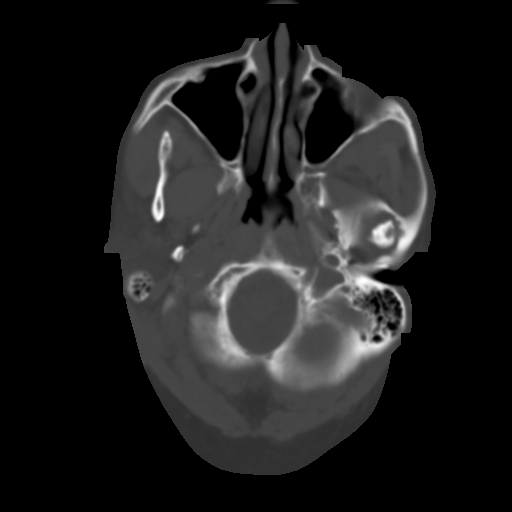
[im 7/30  brain]
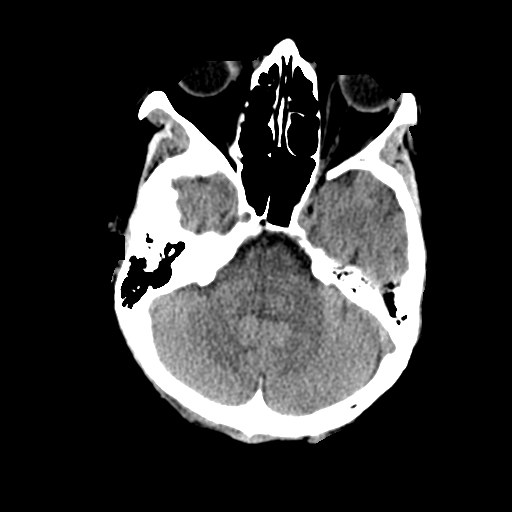
[im 9/30  brain]
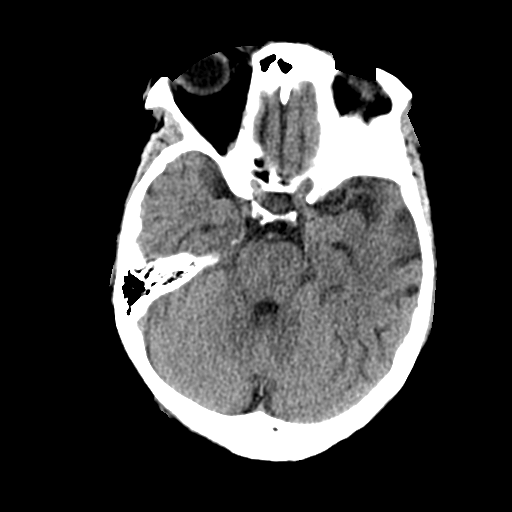
[im 13/30  brain]
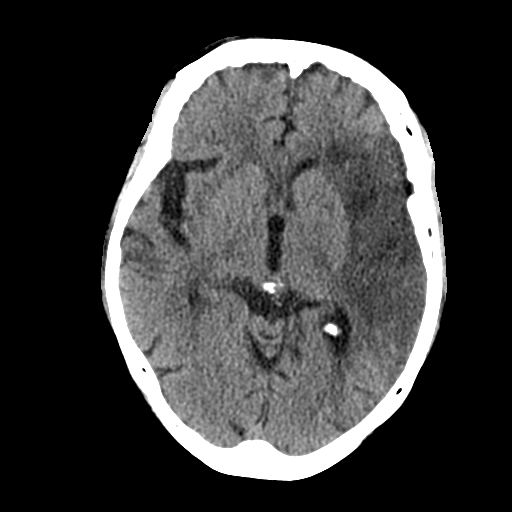
[im 15/30  brain]
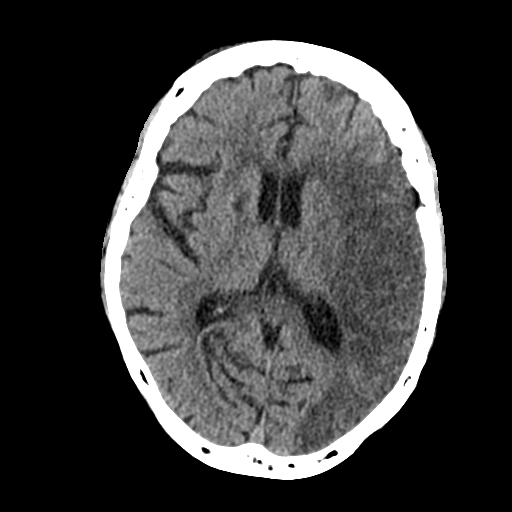
[im 15/30  bone]
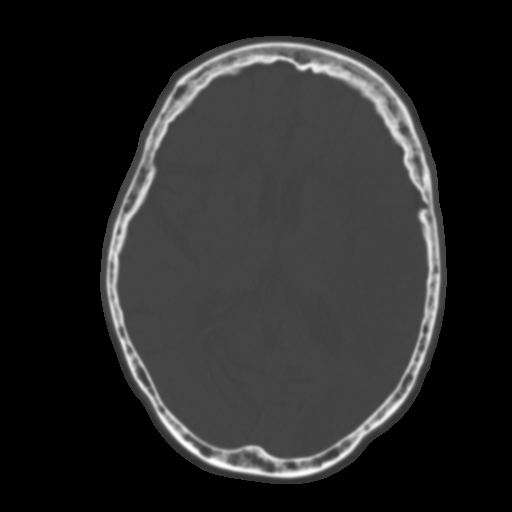
[im 17/30  brain]
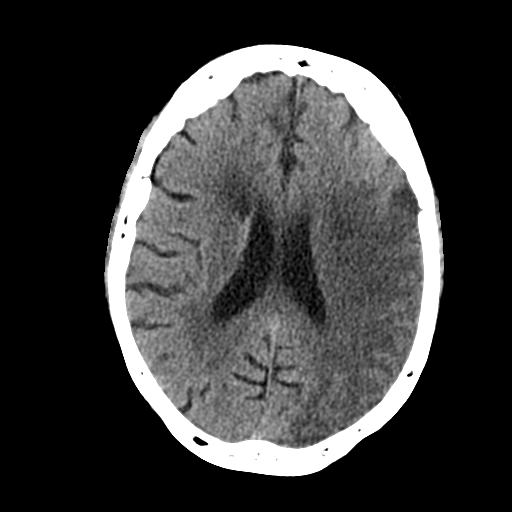
[im 21/30  brain]
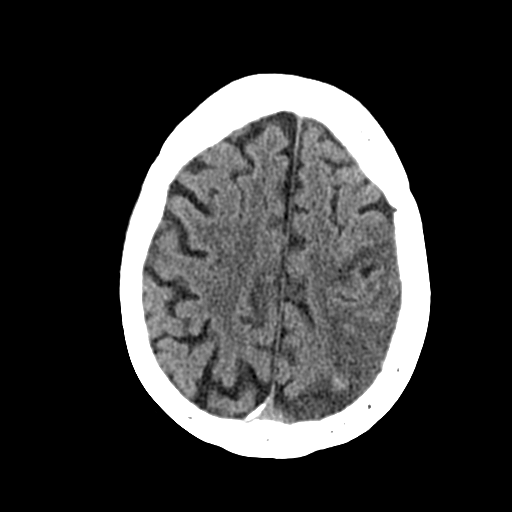
[im 23/30  brain]
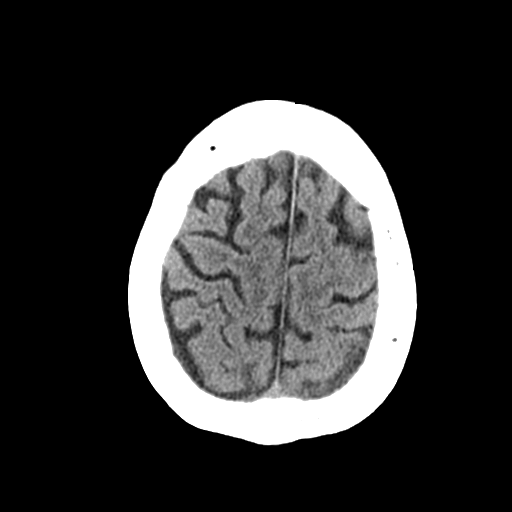
[im 27/30  brain]
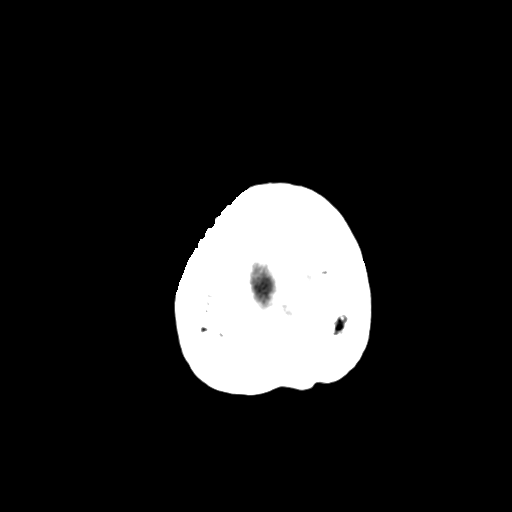
[im 27/30  bone]
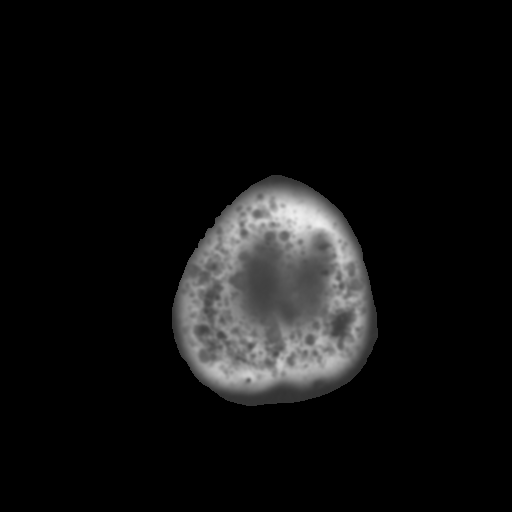

[Series 5: coronal soft tissue · coronal · 0.29mm/px · 3 of 60 slices shown]
[im 20/60  brain]
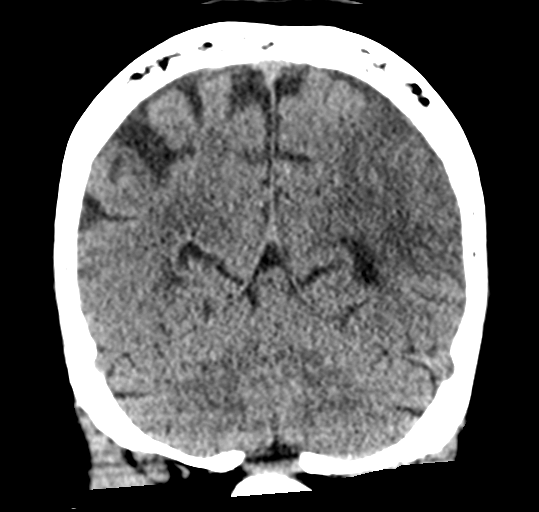
[im 27/60  brain]
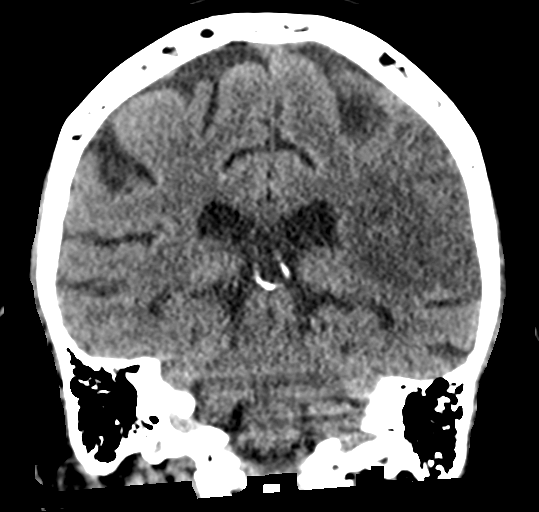
[im 33/60  brain]
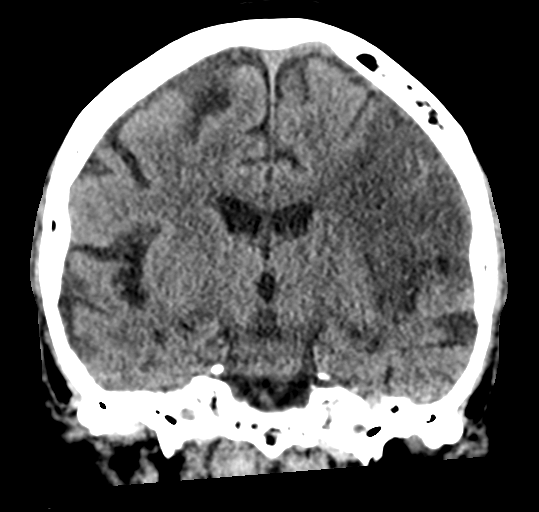

[Series 6: sagittal soft tissue · sagittal · 0.30mm/px · 3 of 49 slices shown]
[im 17/49  brain]
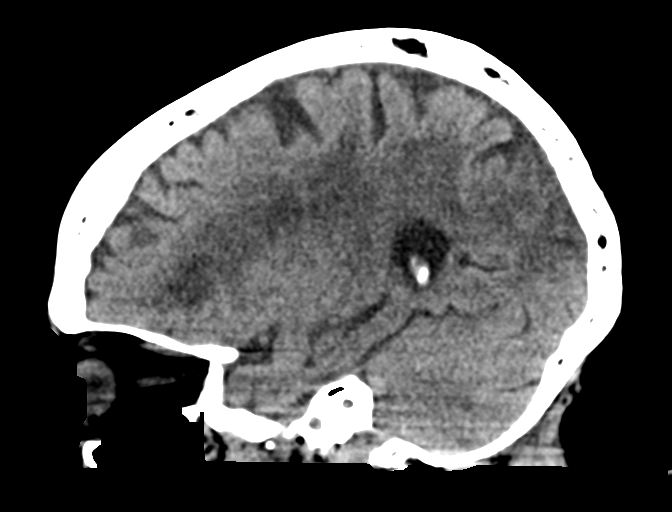
[im 25/49  brain]
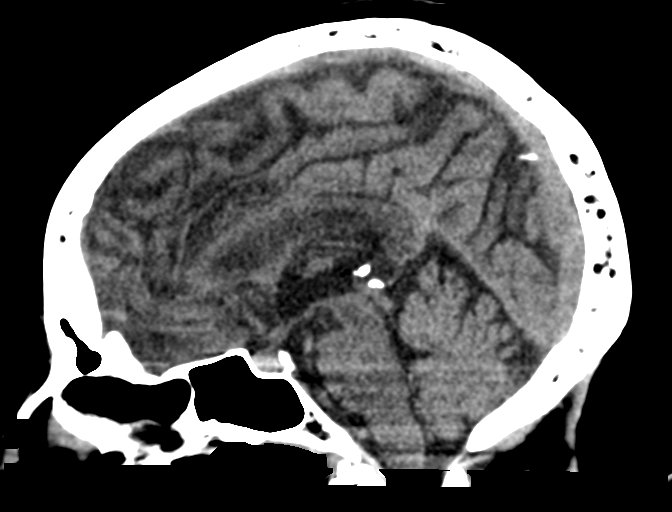
[im 33/49  brain]
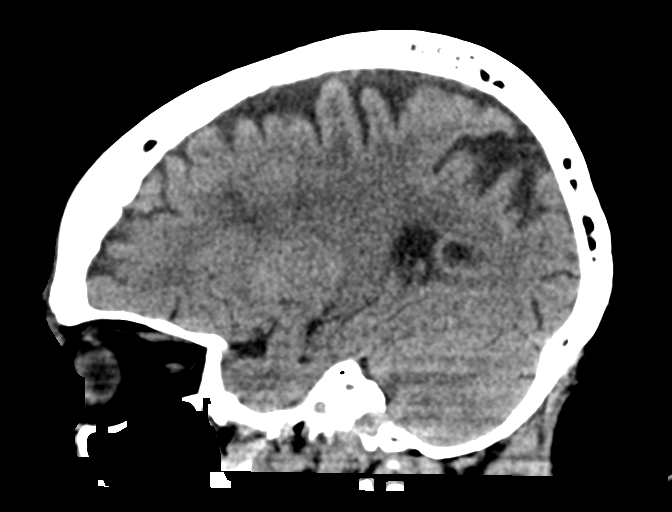

[15 of 47 positions shown; findings below may reference images not displayed]

FINDINGS: Brain: No subdural, epidural, or subarachnoid hemorrhage identified.
The patient's recent left MCA infarct is identified. Curvilinear
high attenuation is consistent with slow flow within or thrombosis
of a left MCA branch, consistent with history of recent infarct.
Lacunar infarct in the right caudate. White matter changes. No acute
cortical ischemia or infarct otherwise seen. Ventricles and sulci
are unchanged. Cerebellum, brainstem, and basal cisterns are normal.
No mass effect or midline shift.

Vascular: No hyperdense vessel or unexpected calcification.

Skull: Numerous lytic lesions throughout the skull suggesting the
possibility of multiple myeloma.

Sinuses/Orbits: No acute finding.

Other: Swelling over the right forehead.
IMPRESSION: 1. No evidence of intracranial hemorrhage after fall.
2. Subacute left MCA infarct as above.
3. No other acute abnormalities.

## 2021-02-20 ENCOUNTER — Other Ambulatory Visit: Payer: Self-pay

## 2021-02-20 ENCOUNTER — Emergency Department: Payer: Medicare (Managed Care)

## 2021-02-20 ENCOUNTER — Inpatient Hospital Stay
Admission: EM | Admit: 2021-02-20 | Discharge: 2021-03-22 | DRG: 951 | Disposition: E | Payer: Medicare (Managed Care) | Attending: Internal Medicine | Admitting: Internal Medicine

## 2021-02-20 ENCOUNTER — Encounter: Payer: Self-pay | Admitting: Emergency Medicine

## 2021-02-20 DIAGNOSIS — E785 Hyperlipidemia, unspecified: Secondary | ICD-10-CM | POA: Diagnosis present

## 2021-02-20 DIAGNOSIS — Z7401 Bed confinement status: Secondary | ICD-10-CM

## 2021-02-20 DIAGNOSIS — Z886 Allergy status to analgesic agent status: Secondary | ICD-10-CM

## 2021-02-20 DIAGNOSIS — Z888 Allergy status to other drugs, medicaments and biological substances status: Secondary | ICD-10-CM

## 2021-02-20 DIAGNOSIS — Z515 Encounter for palliative care: Secondary | ICD-10-CM | POA: Diagnosis not present

## 2021-02-20 DIAGNOSIS — J45909 Unspecified asthma, uncomplicated: Secondary | ICD-10-CM | POA: Diagnosis present

## 2021-02-20 DIAGNOSIS — Z66 Do not resuscitate: Secondary | ICD-10-CM | POA: Diagnosis present

## 2021-02-20 DIAGNOSIS — R130 Aphagia: Secondary | ICD-10-CM | POA: Diagnosis present

## 2021-02-20 DIAGNOSIS — G9341 Metabolic encephalopathy: Secondary | ICD-10-CM | POA: Diagnosis present

## 2021-02-20 DIAGNOSIS — J189 Pneumonia, unspecified organism: Secondary | ICD-10-CM | POA: Diagnosis not present

## 2021-02-20 DIAGNOSIS — E870 Hyperosmolality and hypernatremia: Secondary | ICD-10-CM | POA: Diagnosis not present

## 2021-02-20 DIAGNOSIS — A419 Sepsis, unspecified organism: Secondary | ICD-10-CM | POA: Diagnosis present

## 2021-02-20 DIAGNOSIS — Z20822 Contact with and (suspected) exposure to covid-19: Secondary | ICD-10-CM | POA: Diagnosis present

## 2021-02-20 DIAGNOSIS — J9601 Acute respiratory failure with hypoxia: Secondary | ICD-10-CM | POA: Diagnosis not present

## 2021-02-20 DIAGNOSIS — I69351 Hemiplegia and hemiparesis following cerebral infarction affecting right dominant side: Secondary | ICD-10-CM

## 2021-02-20 DIAGNOSIS — I10 Essential (primary) hypertension: Secondary | ICD-10-CM | POA: Diagnosis present

## 2021-02-20 DIAGNOSIS — I480 Paroxysmal atrial fibrillation: Secondary | ICD-10-CM | POA: Diagnosis present

## 2021-02-20 DIAGNOSIS — I69320 Aphasia following cerebral infarction: Secondary | ICD-10-CM

## 2021-02-20 DIAGNOSIS — Z79899 Other long term (current) drug therapy: Secondary | ICD-10-CM

## 2021-02-20 DIAGNOSIS — Z7901 Long term (current) use of anticoagulants: Secondary | ICD-10-CM

## 2021-02-20 DIAGNOSIS — R6520 Severe sepsis without septic shock: Secondary | ICD-10-CM | POA: Diagnosis present

## 2021-02-20 DIAGNOSIS — C9001 Multiple myeloma in remission: Secondary | ICD-10-CM | POA: Diagnosis present

## 2021-02-20 LAB — CBC WITH DIFFERENTIAL/PLATELET
Abs Immature Granulocytes: 0.05 10*3/uL (ref 0.00–0.07)
Basophils Absolute: 0 10*3/uL (ref 0.0–0.1)
Basophils Relative: 0 %
Eosinophils Absolute: 0 10*3/uL (ref 0.0–0.5)
Eosinophils Relative: 0 %
HCT: 44.6 % (ref 36.0–46.0)
Hemoglobin: 14.4 g/dL (ref 12.0–15.0)
Immature Granulocytes: 0 %
Lymphocytes Relative: 12 %
Lymphs Abs: 1.3 10*3/uL (ref 0.7–4.0)
MCH: 28.2 pg (ref 26.0–34.0)
MCHC: 32.3 g/dL (ref 30.0–36.0)
MCV: 87.3 fL (ref 80.0–100.0)
Monocytes Absolute: 0.7 10*3/uL (ref 0.1–1.0)
Monocytes Relative: 6 %
Neutro Abs: 9.2 10*3/uL — ABNORMAL HIGH (ref 1.7–7.7)
Neutrophils Relative %: 82 %
Platelets: 166 10*3/uL (ref 150–400)
RBC: 5.11 MIL/uL (ref 3.87–5.11)
RDW: 15.3 % (ref 11.5–15.5)
Smear Review: NORMAL
WBC: 11.4 10*3/uL — ABNORMAL HIGH (ref 4.0–10.5)
nRBC: 0.4 % — ABNORMAL HIGH (ref 0.0–0.2)

## 2021-02-20 LAB — COMPREHENSIVE METABOLIC PANEL
ALT: 19 U/L (ref 0–44)
AST: 29 U/L (ref 15–41)
Albumin: 2.7 g/dL — ABNORMAL LOW (ref 3.5–5.0)
Alkaline Phosphatase: 67 U/L (ref 38–126)
Anion gap: 9 (ref 5–15)
BUN: 62 mg/dL — ABNORMAL HIGH (ref 8–23)
CO2: 21 mmol/L — ABNORMAL LOW (ref 22–32)
Calcium: 8.5 mg/dL — ABNORMAL LOW (ref 8.9–10.3)
Chloride: 128 mmol/L — ABNORMAL HIGH (ref 98–111)
Creatinine, Ser: 1.04 mg/dL — ABNORMAL HIGH (ref 0.44–1.00)
GFR, Estimated: 56 mL/min — ABNORMAL LOW (ref >60)
Glucose, Bld: 132 mg/dL — ABNORMAL HIGH (ref 70–99)
Potassium: 2.9 mmol/L — ABNORMAL LOW (ref 3.5–5.1)
Sodium: 158 mmol/L — ABNORMAL HIGH (ref 135–145)
Total Bilirubin: 1.5 mg/dL — ABNORMAL HIGH (ref 0.3–1.2)
Total Protein: 7.7 g/dL (ref 6.5–8.1)

## 2021-02-20 LAB — LACTIC ACID, PLASMA: Lactic Acid, Venous: 2.1 mmol/L (ref 0.5–1.9)

## 2021-02-20 LAB — RESP PANEL BY RT-PCR (FLU A&B, COVID) ARPGX2
Influenza A by PCR: NEGATIVE
Influenza B by PCR: NEGATIVE
SARS Coronavirus 2 by RT PCR: NEGATIVE

## 2021-02-20 MED ORDER — SODIUM CHLORIDE 0.9% FLUSH: 3.0000 mL | INTRAVENOUS | Status: DC | PRN

## 2021-02-20 MED ORDER — LACTATED RINGERS IV SOLN: INTRAVENOUS | Status: DC

## 2021-02-20 MED ORDER — ONDANSETRON HCL 4 MG/2ML IJ SOLN: 4.0000 mg | Freq: Four times a day (QID) | INTRAMUSCULAR | Status: DC | PRN

## 2021-02-20 MED ORDER — LORAZEPAM 2 MG/ML PO CONC
1.0000 mg | ORAL | Status: DC | PRN
  Filled 2021-02-20: qty 0.5

## 2021-02-20 MED ORDER — MORPHINE SULFATE (CONCENTRATE) 10 MG/0.5ML PO SOLN: 5.0000 mg | ORAL | Status: DC | PRN

## 2021-02-20 MED ORDER — GLYCOPYRROLATE 0.2 MG/ML IJ SOLN
0.2000 mg | INTRAMUSCULAR | Status: DC | PRN
  Filled 2021-02-20: qty 1

## 2021-02-20 MED ORDER — LACTATED RINGERS IV BOLUS
500.0000 mL | Freq: Once | INTRAVENOUS | Status: AC
  Administered 2021-02-20: 500 mL via INTRAVENOUS

## 2021-02-20 MED ORDER — HALOPERIDOL LACTATE 5 MG/ML IJ SOLN: 0.5000 mg | INTRAMUSCULAR | Status: DC | PRN

## 2021-02-20 MED ORDER — POLYVINYL ALCOHOL 1.4 % OP SOLN
1.0000 [drp] | Freq: Four times a day (QID) | OPHTHALMIC | Status: DC | PRN
  Filled 2021-02-20: qty 15

## 2021-02-20 MED ORDER — SODIUM CHLORIDE 0.9% FLUSH
3.0000 mL | Freq: Two times a day (BID) | INTRAVENOUS | Status: DC
  Administered 2021-02-20 – 2021-02-22 (×4): 3 mL via INTRAVENOUS

## 2021-02-20 MED ORDER — SODIUM CHLORIDE 0.9 % IV SOLN
2.0000 g | Freq: Once | INTRAVENOUS | Status: AC
  Administered 2021-02-20: 2 g via INTRAVENOUS
  Filled 2021-02-20: qty 2

## 2021-02-20 MED ORDER — MORPHINE SULFATE (PF) 4 MG/ML IV SOLN
4.0000 mg | INTRAVENOUS | Status: DC | PRN
  Administered 2021-02-20: 2 mg via INTRAVENOUS
  Filled 2021-02-20: qty 1

## 2021-02-20 MED ORDER — ACETAMINOPHEN 650 MG RE SUPP: 650.0000 mg | Freq: Four times a day (QID) | RECTAL | Status: DC | PRN

## 2021-02-20 MED ORDER — DEXTROSE 5 % IV SOLN: Freq: Once | INTRAVENOUS | Status: AC

## 2021-02-20 MED ORDER — LORAZEPAM 1 MG PO TABS: 1.0000 mg | ORAL_TABLET | ORAL | Status: DC | PRN

## 2021-02-20 MED ORDER — VANCOMYCIN HCL IN DEXTROSE 1-5 GM/200ML-% IV SOLN
1000.0000 mg | Freq: Once | INTRAVENOUS | Status: DC
  Filled 2021-02-20: qty 200

## 2021-02-20 MED ORDER — LACTATED RINGERS IV BOLUS: 1000.0000 mL | Freq: Once | INTRAVENOUS | Status: DC

## 2021-02-20 MED ORDER — BIOTENE DRY MOUTH MT LIQD
15.0000 mL | OROMUCOSAL | Status: DC | PRN
  Filled 2021-02-20: qty 15

## 2021-02-20 MED ORDER — SODIUM CHLORIDE 0.9 % IV SOLN
500.0000 mg | Freq: Once | INTRAVENOUS | Status: AC
  Administered 2021-02-20: 500 mg via INTRAVENOUS
  Filled 2021-02-20: qty 500

## 2021-02-20 MED ORDER — GLYCOPYRROLATE 1 MG PO TABS
1.0000 mg | ORAL_TABLET | ORAL | Status: DC | PRN
  Filled 2021-02-20: qty 1

## 2021-02-20 MED ORDER — MORPHINE SULFATE (PF) 2 MG/ML IV SOLN
2.0000 mg | INTRAVENOUS | Status: DC | PRN
Start: 2021-02-20 — End: 2021-02-23
  Administered 2021-02-20 – 2021-02-22 (×6): 2 mg via INTRAVENOUS
  Filled 2021-02-20 (×6): qty 1

## 2021-02-20 MED ORDER — ACETAMINOPHEN 325 MG PO TABS: 650.0000 mg | ORAL_TABLET | Freq: Four times a day (QID) | ORAL | Status: DC | PRN

## 2021-02-20 MED ORDER — LORAZEPAM 2 MG/ML IJ SOLN: 1.0000 mg | INTRAMUSCULAR | Status: DC | PRN

## 2021-02-20 MED ORDER — LORAZEPAM 2 MG/ML IJ SOLN
1.0000 mg | INTRAMUSCULAR | Status: DC | PRN
  Administered 2021-02-20 (×2): 1 mg via INTRAVENOUS
  Filled 2021-02-20 (×2): qty 1

## 2021-02-20 MED ORDER — MORPHINE SULFATE (PF) 2 MG/ML IV SOLN
2.0000 mg | INTRAVENOUS | Status: DC | PRN
  Administered 2021-02-21 – 2021-02-22 (×3): 4 mg via INTRAVENOUS
  Filled 2021-02-20 (×3): qty 2

## 2021-02-20 MED ORDER — ONDANSETRON 4 MG PO TBDP: 4.0000 mg | ORAL_TABLET | Freq: Four times a day (QID) | ORAL | Status: DC | PRN

## 2021-02-20 MED ORDER — HALOPERIDOL 0.5 MG PO TABS
0.5000 mg | ORAL_TABLET | ORAL | Status: DC | PRN
  Filled 2021-02-20: qty 1

## 2021-02-20 MED ORDER — LORAZEPAM 2 MG/ML PO CONC: 1.0000 mg | ORAL | Status: DC | PRN

## 2021-02-20 MED ORDER — HALOPERIDOL LACTATE 2 MG/ML PO CONC
0.5000 mg | ORAL | Status: DC | PRN
  Filled 2021-02-20: qty 0.3

## 2021-02-20 MED ORDER — VANCOMYCIN HCL 1500 MG/300ML IV SOLN
1500.0000 mg | Freq: Once | INTRAVENOUS | Status: AC
  Administered 2021-02-20: 1500 mg via INTRAVENOUS
  Filled 2021-02-20: qty 300

## 2021-02-20 MED ORDER — SODIUM CHLORIDE 0.9 % IV SOLN: 250.0000 mL | INTRAVENOUS | Status: DC | PRN

## 2021-02-20 NOTE — ED Notes (Signed)
EDP at bedside. Give 2mg  morphine per EDP verbal order.

## 2021-02-20 NOTE — ED Notes (Signed)
EDP has spoken with family, who still desires to have all life support removed. RT on way to remove HFNC. Family agrees to 1mg  Ativan and PRN morphine.

## 2021-02-20 NOTE — ED Notes (Signed)
HFNC removed. IV maintenance fluid disconnected. Pt appears calm and at ease.

## 2021-02-20 NOTE — H&P (Signed)
Ruth WNUK GQQ:[id number] DOB: 10-21-1946 DOA: 03/10/2021     PCP: [name], MD      Patient arrived to ER on 03/20/2021 at 1029 Referred by Attending No att. providers found   Patient coming from:   From facility [facility].  Chief Complaint:   Chief Complaint  Patient presents with  . Shortness of Breath    HPI: Ruth Sanchez is a 75 y.o. female with medical history significant of  Multiple CVA with aphasia, history of atrial fibrillation right hemiparesis    Presented with   recent nausea and vomiting questionable aspiration Patient nonverbal at baseline she already was started on IV antibiotics and [facility] Initially presented with oxygen saturation down to 85% and started on 100% nonrebreather Came in with atrial fibrillation with RVR at 150 Family came to see patient and expressed desire to make completely comfort care and withdraw all support including oxygen provide morphine as needed for air hunger Patient is being admitted for comfort care       Initial COVID TEST  NEGATIVE    No results found for: SARSCOV2NAA   Regarding pertinent Chronic problems:    Hyperlipidemia -   on statins Zocor Lipid Panel     Component Value Date/Time   CHOL 134 07/01/2018 0503   TRIG 74 07/01/2018 0503   HDL 57 07/01/2018 0503   CHOLHDL 2.4 07/01/2018 0503   VLDL 15 07/01/2018 0503   LDLCALC 62 07/01/2018 0503     HTN on Coreg   Hx of CVA -  With  residual deficits on Eliquis   A. Fib -  - CHA2DS2 vas score   7       current  on anticoagulation with  Eliquis,            -  Rate control:  Currently controlled with  Coreg      While in ER: Transition to comfort care only with withdrawal of IV fluids antibiotics oxygen as per family request     ED Triage Vitals  Enc Vitals Group     BP 03/02/2021 1049 131/76     Pulse Rate 03/14/2021 1046 (!) 134     Resp 03/07/2021 1046 (!) 42     Temp 02/21/2021 1046 98.3 F (36.8 C)     Temp Source  03/09/2021 1046 Axillary     SpO2 02/21/2021 1046 92 %     Weight 03/05/2021 1032 148 lb 13 oz (67.5 kg)     Height 03/04/2021 1032 5' (1.524 m)     Head Circumference --      Peak Flow --      Pain Score --      Pain Loc --      Pain Edu? --      Excl. in [place]? --   TMAX(24)@     _________________________________________ Significant initial  Findings: Abnormal Labs Reviewed  LACTIC ACID, PLASMA - Abnormal; Notable for the following components:      Result Value   Lactic Acid, Venous 2.1 (*)    All other components within normal limits  COMPREHENSIVE METABOLIC PANEL - Abnormal; Notable for the following components:   Sodium 158 (*)    Potassium 2.9 (*)    Chloride 128 (*)    CO2 21 (*)    Glucose, Bld 132 (*)    BUN 62 (*)    Creatinine, Ser 1.04 (*)    Calcium 8.5 (*)    Albumin 2.7 (*)  Total Bilirubin 1.5 (*)    GFR, Estimated 56 (*)    All other components within normal limits  CBC WITH DIFFERENTIAL/PLATELET - Abnormal; Notable for the following components:   WBC 11.4 (*)    nRBC 0.4 (*)    Neutro Abs 9.2 (*)    All other components within normal limits   ____________________________________________ Ordered    CXR -right lower lobe pneumonia     ECG: Ordered Personally reviewed by me showing: HR : 138 Rhythm  A.fib. W RVR,    , no evidence of ischemic changes [id number]   This patient meets SIRS Criteria and may be septic.   The recent clinical data is shown below. Vitals:   03/01/2021 1730 03/21/2021 1800 03/10/2021 1830 02/25/2021 1915  BP:      Pulse: (!) 113 (!) 112 (!) 121 (!) 127  Resp: (!) 43 (!) 27 (!) 37 (!) 35  Temp:      TempSrc:      SpO2: (!) 76% (!) 77% (!) 77% (!) 78%  Weight:      Height:          WBC     Component Value Date/Time   WBC 11.4 (H) 03/02/2021 1049   LYMPHSABS 1.3 02/21/2021 1049   LYMPHSABS 2.1 05/28/2017 1141   MONOABS 0.7 02/27/2021 1049   MONOABS 0.6 05/28/2017 1141   EOSABS 0.0 03/04/2021 1049   EOSABS 0.1 05/28/2017 1141    BASOSABS 0.0 03/11/2021 1049   BASOSABS 0.0 05/28/2017 1141    Lactic Acid, Venous    Component Value Date/Time   LATICACIDVEN 2.1 (HH) 03/09/2021 1049    UA  not ordered    Results for orders placed or performed during the hospital encounter of 03/15/2021  Resp Panel by RT-PCR (Flu A&B, Covid) Nasopharyngeal Swab     Status: None   Collection Time: 03/15/2021 10:00 PM   Specimen: Nasopharyngeal Swab; Nasopharyngeal(NP) swabs in vial transport medium  Result Value Ref Range Status   SARS Coronavirus 2 by RT PCR NEGATIVE NEGATIVE Final         Influenza A by PCR NEGATIVE NEGATIVE Final   Influenza B by PCR NEGATIVE NEGATIVE Final           _______________________________________________ Hospitalist was called for admission for acute respiratory failure with hypoxia secondary to pneumonia  The following Work up has been ordered so far:  Orders Placed This Encounter  Procedures  . Critical Care  . Resp Panel by RT-PCR (Flu A&B, Covid) Nasopharyngeal Swab  . DG Chest Port 1 View  . Comprehensive metabolic panel  . CBC WITH DIFFERENTIAL  . Diet NPO time specified  . Cardiac monitoring  . Document height and weight  . Notify provider for difficulties obtaining IV access.  . Insert peripheral IV x 2  . Initiate Carrier Fluid Protocol  . Notify physician  . Turn patient / repostion q2 hours and prn for comfort  . Do not attempt resuscitation (DNR)  . Consult to hospitalist  . Airborne and Contact precautions  . Pulse oximetry, continuous  . ED EKG 12-Lead  . EKG 12-Lead      Following Medications were ordered in ER: Medications  acetaminophen (TYLENOL) tablet 650 mg (has no administration in time range)    Or  acetaminophen (TYLENOL) suppository 650 mg (has no administration in time range)  LORazepam (ATIVAN) tablet 1 mg ( Oral See Alternative 03/16/2021 1613)    Or  LORazepam (ATIVAN) 2 MG/ML concentrated solution 1 mg (  Sublingual See Alternative 03/14/2021 1613)    Or   LORazepam (ATIVAN) injection 1 mg (1 mg Intravenous Given 02/19/2021 1613)  morphine CONCENTRATE 10 MG/0.5ML oral solution 5 mg (has no administration in time range)    Or  morphine CONCENTRATE 10 MG/0.5ML oral solution 5 mg (has no administration in time range)  morphine 2 MG/ML injection 2 mg (has no administration in time range)  ceFEPIme (MAXIPIME) 2 g in sodium chloride 0.9 % 100 mL IVPB (0 g Intravenous Stopped 03/05/2021 1142)  azithromycin (ZITHROMAX) 500 mg in sodium chloride 0.9 % 250 mL IVPB (0 mg Intravenous Stopped 03/14/2021 1246)  vancomycin (VANCOREADY) IVPB 1500 mg/300 mL (0 mg Intravenous Stopped 02/21/2021 1338)  dextrose 5 % solution ( Intravenous Stopped 02/23/2021 1625)  lactated ringers bolus 500 mL (0 mLs Intravenous Stopped 03/18/2021 1415)        Consult Orders  (From admission, onward)         Start     Ordered   03/07/2021 2102  Consult to hospitalist  Once       Provider:  (Not yet assigned)  Question Answer Comment  Place call to: [phone]   Reason for Consult Admit   Diagnosis/Clinical Info for Consult: comfort care      03/01/2021 2101            OTHER Significant initial  Findings:  labs showing:    Recent Labs  Lab 02/21/2021 1049  NA 158*  K 2.9*  CO2 21*  GLUCOSE 132*  BUN 62*  CREATININE 1.04*  CALCIUM 8.5*    Cr  * stable,  Up from baseline see below Lab Results  Component Value Date   CREATININE 1.04 (H) 03/19/2021   CREATININE 0.83 08/26/2018   CREATININE 0.64 07/18/2018    Recent Labs  Lab 03/05/2021 1049  AST 29  ALT 19  ALKPHOS 67  BILITOT 1.5*  PROT 7.7  ALBUMIN 2.7*   Lab Results  Component Value Date   CALCIUM 8.5 (L) 03/17/2021        ABG    Component Value Date/Time   TCO2 23 06/30/2018 1808      Recent Labs  Lab 03/09/2021 1049  WBC 11.4*  NEUTROABS 9.2*  HGB 14.4  HCT 44.6  MCV 87.3  PLT 166    HG/HCT  stable,      Component Value Date/Time   HGB 14.4 03/12/2021 1049   HGB 13.0 08/26/2018 1106   HGB 13.8  05/28/2017 1141   HCT 44.6 02/19/2021 1049   HCT 42.0 05/28/2017 1141   MCV 87.3 03/05/2021 1049   MCV 93.3 05/28/2017 1141    Cultures:    Component Value Date/Time   SDES BLOOD RIGHT FOREARM 12/10/2016 2002   SPECREQUEST BOTTLES DRAWN AEROBIC AND ANAEROBIC 5CC 12/10/2016 2002   CULT  12/10/2016 2002    NO GROWTH 5 DAYS Performed at [facility], [address], [place], [place] [address]    REPTSTATUS 12/15/2016 FINAL 12/10/2016 2002     Radiological Exams on Admission: DG Chest Port 1 View  Result Date: 02/26/2021 CLINICAL DATA:  Pneumonia EXAM: PORTABLE CHEST 1 VIEW COMPARISON:  Chest radiograph July 02, 2018; chest CT August 31, 2016 FINDINGS: Patient is significantly rotated. There is airspace consolidation in the right lower lung region. Suspect nipple shadow on the left. Left lung otherwise clear. Overall heart size is upper normal, stable, with pulmonary vascularity normal. No adenopathy. Stable fracture prior right fourth rib. Thyroid enlargement suspected, better  seen on 2017 CT. IMPRESSION: Airspace opacity consistent with pneumonia right lower lung region. Right lung otherwise clear. Suspect nipple shadow on the left. It may be prudent to consider repeat study with nipple markers. Left lung otherwise clear. Stable cardiac silhouette. Note that patient is considerably rotated, similar to prior study. Suspect left-sided thyroid enlargement. Electronically Signed   By: Lowella Grip III M.D.   On: 03/11/2021 11:28   _______________________________________________________________________________________________________ Latest  Blood pressure 112/73, pulse (!) 127, temperature 98.3 F (36.8 C), temperature source Axillary, resp. rate (!) 35, height 5' (1.524 m), weight 67.5 kg, SpO2 (!) 78 %.   Review of Systems:    Pertinent positives include:   fatigue, shortness of breath at rest.  non-productive cough,   Constitutional:  No weight loss, night sweats,  Fevers, chillsweight loss  HEENT:  No headaches, Difficulty swallowing,Tooth/dental problems,Sore throat,  No sneezing, itching, ear ache, nasal congestion, post nasal drip,  Cardio-vascular:  No chest pain, Orthopnea, PND, anasarca, dizziness, palpitations.no Bilateral lower extremity swelling  GI:  No heartburn, indigestion, abdominal pain, nausea, vomiting, diarrhea, change in bowel habits, loss of appetite, melena, blood in stool, hematemesis Resp:  no No dyspnea on exertion, No excess mucus, no productive cough, NoNo coughing up of blood.No change in color of mucus.No wheezing. Skin:  no rash or lesions. No jaundice GU:  no dysuria, change in color of urine, no urgency or frequency. No straining to urinate.  No flank pain.  Musculoskeletal:  No joint pain or no joint swelling. No decreased range of motion. No back pain.  Psych:  No change in mood or affect. No depression or anxiety. No memory loss.  Neuro: no localizing neurological complaints, no tingling, no weakness, no double vision, no gait abnormality, no slurred speech, no confusion  All systems reviewed and apart from [place] all are negative _______________________________________________________________________________________________ Past Medical History:   Past Medical History:  Diagnosis Date  . Arthritis   . Asthma   . Cancer ([place])   . Hypertension      Past Surgical History:  Procedure Laterality Date  . APPENDECTOMY    . CHOLECYSTECTOMY    . ESOPHAGOGASTRODUODENOSCOPY N/A 11/24/2015   Procedure: ESOPHAGOGASTRODUODENOSCOPY (EGD);  Surgeon: Clarene Essex, MD;  Location: [facility];  Service: Endoscopy;  Laterality: N/A;  . IR CT HEAD LTD  06/30/2018  . IR PERCUTANEOUS ART THROMBECTOMY/INFUSION INTRACRANIAL INC DIAG ANGIO  06/30/2018  . RADIOLOGY WITH ANESTHESIA N/A 06/30/2018   Procedure: IR WITH ANESTHESIA;  Surgeon: Luanne Bras, MD;  Location: [facility];  Service: Radiology;  Laterality: N/A;  . TUBAL LIGATION       Social History:  Ambulatory , bed bound     reports that she has never smoked. She has never used smokeless tobacco. She reports that she does not drink alcohol and does not use drugs.    Family History:   Family History  Family history unknown: Yes   ______________________________________________________________________________________________ Allergies: Allergies  Allergen Reactions  . Gabapentin Swelling and Other (See Comments)    Reaction:  Lip swelling  . Aspirin Itching, Swelling and Rash    Lips swell, itching     Prior to Admission medications   Medication Sig Start Date End Date Taking? Authorizing Provider  amoxicillin-clavulanate (AUGMENTIN) 875-125 MG tablet Take 1 tablet by mouth 2 (two) times daily. For 7 days   Yes [provider]  apixaban (ELIQUIS) 5 MG TABS tablet Take 1 tablet (5 mg total) by mouth 2 (two) times daily. 07/08/18  Yes Donzetta Starch, NP  carvedilol (COREG) 12.5 MG tablet Place 1 tablet (12.5 mg total) into feeding tube 2 (two) times daily with a meal. Patient taking differently: Place 6.25 mg into feeding tube 2 (two) times daily with a meal. 07/08/18  Yes Donzetta Starch, NP  gabapentin (NEURONTIN) 100 MG capsule Take 100 mg by mouth 3 (three) times daily. 01/25/21  Yes [provider]  ipratropium-albuterol (DUONEB) 0.5-2.5 (3) MG/3ML SOLN Take 3 mLs by nebulization in the morning, at noon, and at bedtime. For 10 days   Yes [provider]  metoCLOPramide (REGLAN) 5 MG/ML injection Inject 5 mg into the vein every 8 (eight) hours. For 3 days   Yes [provider]  metroNIDAZOLE (FLAGYL) 500 MG tablet Take 500 mg by mouth 3 (three) times daily. For 50 days   Yes [provider]  potassium chloride SA (KLOR-CON) 20 MEQ tablet Take 40 mEq by mouth 2 (two) times daily. 02/07/21  Yes [provider]  sertraline (ZOLOFT) 50 MG tablet Take 1 tablet by mouth daily. 01/25/21  Yes [provider]   simvastatin (ZOCOR) 10 MG tablet Take 10 mg by mouth daily as needed. 02/07/21  Yes [provider]  albuterol (PROVENTIL HFA;VENTOLIN HFA) 108 (90 Base) MCG/ACT inhaler Inhale 2 puffs into the lungs every 4 (four) hours as needed for wheezing or shortness of breath. Patient not taking: Reported on 03/21/2021 06/24/18   [name], MD  amLODipine (NORVASC) 5 MG tablet Place 1 tablet (5 mg total) into feeding tube daily. Patient not taking: Reported on 02/28/2021 07/09/18   Donzetta Starch, NP  cetirizine (ZYRTEC) 10 MG tablet Take 1 tablet (10 mg total) by mouth daily. Patient not taking: Reported on 03/11/2021 06/24/18   [name], MD  collagenase (SANTYL) ointment Apply topically daily. Patient not taking: Reported on 03/15/2021 07/08/18   Donzetta Starch, NP  feeding supplement, ENSURE ENLIVE, (ENSURE ENLIVE) LIQD Take 237 mLs by mouth 3 (three) times daily between meals. Patient not taking: Reported on 02/26/2021 07/08/18   Donzetta Starch, NP  Maltodextrin-Xanthan Gum (RESOURCE THICKENUP CLEAR) POWD Take 120 g by mouth as needed (honey thick liquids). Patient not taking: Reported on 03/20/2021 07/08/18   Donzetta Starch, NP  mirtazapine (REMERON) 15 MG tablet Take 7.5 mg by mouth daily. Patient not taking: Reported on 03/01/2021 10/30/20   [provider]  potassium chloride (K-DUR) 10 MEQ tablet Take 1 tablet (10 mEq total) by mouth 2 (two) times daily for 14 days. 07/18/18 08/01/18  Merlyn Lot, MD  senna-docusate (SENOKOT-S) 8.6-50 MG tablet Take 1 tablet by mouth at bedtime as needed for mild constipation. Patient not taking: Reported on 02/19/2021 07/08/18   Donzetta Starch, NP    ___________________________________________________________________________________________________ Physical Exam: Vitals with BMI 03/13/2021 03/05/2021 02/26/2021  Height - - -  Weight - - -  BMI - - -  Systolic - - -  Diastolic - - -  Pulse 675 121 112    1. General:  in No  Acute distress    Chronically ill   -appearing 2. Psychological somnolent not oriented 3. Head/ENT:   Dry Mucous Membranes                          Head Non traumatic, neck supple  Poor Dentition 4. SKIN:   decreased Skin turgor,  Skin clean Dry and intact no rash 5. Heart: Regular rate and rhythm no   Murmur, no Rub or gallop 6. Lungs:  no wheezes or crackles   7. Abdomen: Soft,  non-tender, Non distended  8. Lower extremities: no clubbing, cyanosis, no  edema 9. Neurologically right-sided weakness not following commands 10. MSK: Normal range of motion    Chart has been reviewed  ______________________________________________________________________________________________  Assessment/Plan  75 y.o. female with medical history significant of  Multiple CVA with aphasia, history of atrial fibrillation right hemiparesis  Admitted for sepsis acute respiratory failure with hypoxia secondary to pneumonia   Present on Admission: . Acute respiratory failure with hypoxia (HCC) -  this patient has acute respiratory failure with Hypoxia   as documented by the presence of following: O2 saturatio< 90% on RA Likely due to:   Pneumonia  Comfort care family decided to hold off on oxygen therapy  . Paroxysmal atrial fibrillation (HCC) -hold off on Eliquis and blood pressure medications for now patient is comfort care  . Sepsis ([place]) -   -SIRS criteria met with  elevated white blood cell count,       Component Value Date/Time   WBC 11.4 (H) 03/06/2021 1049   LYMPHSABS 1.3 03/16/2021 1049   LYMPHSABS 2.1 05/28/2017 1141     tachycardia   , RR >20 Today's Vitals   02/19/2021 1800 03/11/2021 1830 02/19/2021 1915 02/21/21 0030  BP:    93/62  Pulse: (!) 112 (!) 121 (!) 127 (!) 142  Resp: (!) 27 (!) 37 (!) 35 (!) 28  Temp:    98.8 F (37.1 C)  TempSrc:      SpO2: (!) 77% (!) 77% (!) 78% (!) 84%  Weight:      Height:       Body mass index is 29.06 kg/m.  This patient meets SIRS  Criteria    The recent clinical data is shown below. Vitals:   02/26/2021 1800 03/04/2021 1830 03/03/2021 1915 02/21/21 0030  BP:    93/62  Pulse: (!) 112 (!) 121 (!) 127 (!) 142  Resp: (!) 27 (!) 37 (!) 35 (!) 28  Temp:    98.8 F (37.1 C)  TempSrc:      SpO2: (!) 77% (!) 77% (!) 78% (!) 84%  Weight:      Height:          -Most likely source being   Pulmonary,    Patient meeting criteria for Severe sepsis with    evidence of end organ damage/organ dysfunction such as   elevated lactic acid >2     Component Value Date/Time   LATICACIDVEN 2.1 (HH) 02/25/2021 5638    acute metabolic encephalopathy    Acute hypoxia requiring new supplemental oxygen, SpO2: (!) 84 % O2 Flow Rate (L/min): 2 L/min FiO2 (%): 96 %         Fluid resuscitation however was limited because  (patient is enrolled in Hospice with comfort care status  guardian, who prefer limited resuscitation with fluid  12:45 AM  . Aphagia chronic secondary to stroke does not tolerate p.o. at this time  . CAP (community acquired pneumonia) - comfort care only, family wishes to hold off on all care for now except for comfort Overall extremely poor prognosis life expectation  Hours to days  Other plan as per orders.  DVT prophylaxis:  Comfort care none  Code Status:    Code Status:  DNR  DNR/DNI  comfort care as per  family  I had personally discussed CODE STATUS with family     Family Communication:   Family  at  Bedside  plan of care was discussed  with  Son   Disposition Plan:                              Back to current facility when stable                             Following barriers for discharge:                                                         Pain and air hunger controlled with PO medications                                                                    Palliative care    consulted                                    Consults called: none   Admission status:  ED Disposition    ED  Disposition Condition [place]: [facility] [[id number]]  Level of Care: Med-Surg [16]  Covid Evaluation: Confirmed COVID Negative  Diagnosis: Acute respiratory failure with hypoxia [facility]) [[id number]]  Admitting Physician: Toy Baker [[id number]]  Attending Physician: Toy Baker [[id number]]        Obs   Level of care       medical floor       Lab Results  Component Value Date   [place] NEGATIVE 03/14/2021     Precautions: admitted as  Covid Negative  PPE: Used by the provider:   N95  eye Goggles,  Gloves    Brolin Dambrosia 02/21/2021, 12:39 AM    [facility]     after 2 AM please page floor coverage PA If 7AM-7PM, please contact the day team taking care of the patient using [email or web address]   Patient was evaluated in the context of the global COVID-19 pandemic, which necessitated consideration that the patient might be at risk for infection with the SARS-CoV-2 virus that causes COVID-19. Institutional protocols and algorithms that pertain to the evaluation of patients at risk for COVID-19 are in a state of rapid change based on information released by regulatory bodies including the CDC and federal and state organizations. These policies and algorithms were followed during the patient's care.

## 2021-02-20 NOTE — ED Notes (Signed)
D provider at bedside.  Family member at bedside.  Pt has intermittent desaturations on monitor that come back to 90-93% with deep breathing (pt follows commands). Pt desaturated down to 69% while EDP in room. EDP instructed nurse to call RT for high flow [place].  Called RT, they are on way.

## 2021-02-20 NOTE — ED Notes (Signed)
RT at bedside applying HF [place].

## 2021-02-20 NOTE — ED Triage Notes (Addendum)
BIB EMS.  From [facility].  Per EMS reprot, patient had vomiting event last night, ? aspiration.  Patient has history of CVA, and non verbal at baseline.  Patient has known pneumonia and receiving IV antibiotics at [facility]. Patient is a DNR.  Initial O2 sat 85%, currently 90% on 100 % NRB  HR:  115-150, afib RVR

## 2021-02-20 NOTE — ED Provider Notes (Signed)
[facility] Emergency Department Provider Note    Event Date/Time   First MD Initiated Contact with Patient 03/13/2021 1033     (approximate)  I have reviewed the triage vital signs and the nursing notes.   HISTORY  Chief Complaint Shortness of Breath  Level V Caveat:  resp distress  HPI JAMYIAH LABELLA is a 75 y.o. female history of CVA and nonverbal presents to the ER for worsening shortness of breath and reported concern for aspiration event after having episode of vomiting yesterday.  No additional history provided.      Past Medical History:  Diagnosis Date  . Arthritis   . Asthma   . Cancer ([place])   . Hypertension    Family History  Family history unknown: Yes   Past Surgical History:  Procedure Laterality Date  . APPENDECTOMY    . CHOLECYSTECTOMY    . ESOPHAGOGASTRODUODENOSCOPY N/A 11/24/2015   Procedure: ESOPHAGOGASTRODUODENOSCOPY (EGD);  Surgeon: Clarene Essex, MD;  Location: [facility];  Service: Endoscopy;  Laterality: N/A;  . IR CT HEAD LTD  06/30/2018  . IR PERCUTANEOUS ART THROMBECTOMY/INFUSION INTRACRANIAL INC DIAG ANGIO  06/30/2018  . RADIOLOGY WITH ANESTHESIA N/A 06/30/2018   Procedure: IR WITH ANESTHESIA;  Surgeon: Luanne Bras, MD;  Location: [place];  Service: Radiology;  Laterality: N/A;  . TUBAL LIGATION     Patient Active Problem List   Diagnosis Date Noted  . Right groin wound 07/08/2018  . Dysphagia due to recent cerebrovascular accident 07/08/2018  . Leukocytosis 07/08/2018  . Acute ischemic stroke [facility]) s/p attempted mechanical thrombectomy 06/30/2018  . Middle cerebral artery embolism, left 06/30/2018  . Chronic diarrhea 03/29/2017  . Urinary incontinence 08/03/2015  . Essential hypertension 05/13/2015  . Chronic back pain 05/13/2015  . Acute bronchitis 02/23/2013  . Dyspnea 02/23/2013  . Paroxysmal atrial fibrillation ([place]) 02/23/2013  . Arthritis 02/23/2013  . Goiter with tracheal compression and mild stridor  02/23/2013  . Asthma, chronic 02/23/2013  . Hypokalemia 02/23/2013  . Multiple myeloma in remission ([place]) 11/18/2011      Prior to Admission medications   Medication Sig Start Date End Date Taking? Authorizing Provider  amoxicillin-clavulanate (AUGMENTIN) 875-125 MG tablet Take 1 tablet by mouth 2 (two) times daily. For 7 days   Yes [provider]  apixaban (ELIQUIS) 5 MG TABS tablet Take 1 tablet (5 mg total) by mouth 2 (two) times daily. 07/08/18  Yes Donzetta Starch, NP  carvedilol (COREG) 12.5 MG tablet Place 1 tablet (12.5 mg total) into feeding tube 2 (two) times daily with a meal. Patient taking differently: Place 6.25 mg into feeding tube 2 (two) times daily with a meal. 07/08/18  Yes Donzetta Starch, NP  gabapentin (NEURONTIN) 100 MG capsule Take 100 mg by mouth 3 (three) times daily. 01/25/21  Yes [provider]  ipratropium-albuterol (DUONEB) 0.5-2.5 (3) MG/3ML SOLN Take 3 mLs by nebulization in the morning, at noon, and at bedtime. For 10 days   Yes [provider]  metoCLOPramide (REGLAN) 5 MG/ML injection Inject 5 mg into the vein every 8 (eight) hours. For 3 days   Yes [provider]  metroNIDAZOLE (FLAGYL) 500 MG tablet Take 500 mg by mouth 3 (three) times daily. For 50 days   Yes [provider]  potassium chloride SA (KLOR-CON) 20 MEQ tablet Take 40 mEq by mouth 2 (two) times daily. 02/07/21  Yes [provider]  sertraline (ZOLOFT) 50 MG tablet Take 1 tablet by mouth daily.  01/25/21  Yes [provider]  simvastatin (ZOCOR) 10 MG tablet Take 10 mg by mouth daily as needed. 02/07/21  Yes [provider]  albuterol (PROVENTIL HFA;VENTOLIN HFA) 108 (90 Base) MCG/ACT inhaler Inhale 2 puffs into the lungs every 4 (four) hours as needed for wheezing or shortness of breath. Patient not taking: Reported on 03/16/2021 06/24/18   [name], MD  amLODipine (NORVASC) 5 MG tablet Place 1 tablet (5 mg total) into feeding  tube daily. Patient not taking: Reported on 02/19/2021 07/09/18   Donzetta Starch, NP  cetirizine (ZYRTEC) 10 MG tablet Take 1 tablet (10 mg total) by mouth daily. Patient not taking: Reported on 02/25/2021 06/24/18   [name], MD  collagenase (SANTYL) ointment Apply topically daily. Patient not taking: Reported on 03/10/2021 07/08/18   Donzetta Starch, NP  feeding supplement, ENSURE ENLIVE, (ENSURE ENLIVE) LIQD Take 237 mLs by mouth 3 (three) times daily between meals. Patient not taking: Reported on 03/01/2021 07/08/18   Donzetta Starch, NP  Maltodextrin-Xanthan Gum (RESOURCE THICKENUP CLEAR) POWD Take 120 g by mouth as needed (honey thick liquids). Patient not taking: Reported on 02/19/2021 07/08/18   Donzetta Starch, NP  mirtazapine (REMERON) 15 MG tablet Take 7.5 mg by mouth daily. Patient not taking: Reported on 03/10/2021 10/30/20   [provider]  potassium chloride (K-DUR) 10 MEQ tablet Take 1 tablet (10 mEq total) by mouth 2 (two) times daily for 14 days. 07/18/18 08/01/18  Merlyn Lot, MD  senna-docusate (SENOKOT-S) 8.6-50 MG tablet Take 1 tablet by mouth at bedtime as needed for mild constipation. Patient not taking: Reported on 03/07/2021 07/08/18   Donzetta Starch, NP    Allergies Gabapentin and Aspirin    Social History Social History   Tobacco Use  . Smoking status: Never Smoker  . Smokeless tobacco: Never Used  Substance Use Topics  . Alcohol use: No  . Drug use: No    Review of Systems Patient denies headaches, rhinorrhea, blurry vision, numbness, shortness of breath, chest pain, edema, cough, abdominal pain, nausea, vomiting, diarrhea, dysuria, fevers, rashes or hallucinations unless otherwise stated above in HPI. ____________________________________________   PHYSICAL EXAM:  VITAL SIGNS: Vitals:   02/27/2021 1413 03/13/2021 1500  BP: 112/73   Pulse: (!) 119 (!) 119  Resp: (!) 34 (!) 30  Temp:    SpO2: 92% 94%    Constitutional: Alert, chronically ill  appearing in mod resp distress Eyes: Conjunctivae are normal.  Head: Atraumatic. Nose: No congestion/rhinnorhea. Mouth/Throat: Mucous membranes are moist.   Neck: No stridor. Painless ROM.  Cardiovascular: tachycardic irregularly irregular rhythm. Grossly normal heart sounds.  Good peripheral circulation. Respiratory: Normal respiratory effort.  No retractions. Lungs with inspiratory crackles and rhnochorus bs in RL fields. Gastrointestinal: Soft and nontender. No distention. No abdominal bruits. No CVA tenderness. Genitourinary: deferred Musculoskeletal: No lower extremity tenderness nor edema.  No joint effusions. Neurologic: chronic right hemiparesis Skin:  Skin is warm, dry and intact. No rash noted. Psychiatric: unable to assess 2/2 nonverbal ____________________________________________   LABS (all labs ordered are listed, but only abnormal results are displayed)  Results for orders placed or performed during the hospital encounter of 03/21/2021 (from the past 24 hour(s))  Lactic acid, plasma     Status: Abnormal   Collection Time: 03/12/2021 10:49 AM  Result Value Ref Range   Lactic Acid, Venous 2.1 (HH) 0.5 - 1.9 mmol/L  Comprehensive metabolic panel     Status: Abnormal   Collection Time: 02/27/2021  10:49 AM  Result Value Ref Range   Sodium 158 (H) 135 - 145 mmol/L   Potassium 2.9 (L) 3.5 - 5.1 mmol/L   Chloride 128 (H) 98 - 111 mmol/L   CO2 21 (L) 22 - 32 mmol/L   Glucose, Bld 132 (H) 70 - 99 mg/dL   BUN 62 (H) 8 - 23 mg/dL   Creatinine, Ser 1.04 (H) 0.44 - 1.00 mg/dL   Calcium 8.5 (L) 8.9 - 10.3 mg/dL   Total Protein 7.7 6.5 - 8.1 g/dL   Albumin 2.7 (L) 3.5 - 5.0 g/dL   AST 29 15 - 41 U/L   ALT 19 0 - 44 U/L   Alkaline Phosphatase 67 38 - 126 U/L   Total Bilirubin 1.5 (H) 0.3 - 1.2 mg/dL   GFR, Estimated 56 (L) >60 mL/min   Anion gap 9 5 - 15  CBC WITH DIFFERENTIAL     Status: Abnormal   Collection Time: 03/14/2021 10:49 AM  Result Value Ref Range   WBC 11.4 (H) 4.0 -  10.5 K/uL   RBC 5.11 3.87 - 5.11 MIL/uL   Hemoglobin 14.4 12.0 - 15.0 g/dL   HCT 44.6 36.0 - 46.0 %   MCV 87.3 80.0 - 100.0 fL   MCH 28.2 26.0 - 34.0 pg   MCHC 32.3 30.0 - 36.0 g/dL   RDW 15.3 11.5 - 15.5 %   Platelets 166 150 - 400 K/uL   nRBC 0.4 (H) 0.0 - 0.2 %   Neutrophils Relative % 82 %   Neutro Abs 9.2 (H) 1.7 - 7.7 K/uL   Lymphocytes Relative 12 %   Lymphs Abs 1.3 0.7 - 4.0 K/uL   Monocytes Relative 6 %   Monocytes Absolute 0.7 0.1 - 1.0 K/uL   Eosinophils Relative 0 %   Eosinophils Absolute 0.0 0.0 - 0.5 K/uL   Basophils Relative 0 %   Basophils Absolute 0.0 0.0 - 0.1 K/uL   WBC Morphology VACUOLATED NEUTROPHILS    Smear Review Normal platelet morphology    Immature Granulocytes 0 %   Abs Immature Granulocytes 0.05 0.00 - 0.07 K/uL   Polychromasia PRESENT    ____________________________________________  EKG My review and personal interpretation at Time: 10:42   Indication: sob  Rate: 140  Rhythm: afib with rvr Axis: normal Other: normal intervals, no stemi ____________________________________________  RADIOLOGY  I personally reviewed all radiographic images ordered to evaluate for the above acute complaints and reviewed radiology reports and findings.  These findings were personally discussed with the patient.  Please see medical record for radiology report.  ____________________________________________   PROCEDURES  Procedure(s) performed:  .Critical Care Performed by: Merlyn Lot, MD Authorized by: Merlyn Lot, MD   Critical care provider statement:    Critical care time (minutes):  35   Critical care time was exclusive of:  Separately billable procedures and treating other patients   Critical care was necessary to treat or prevent imminent or life-threatening deterioration of the following conditions:  Respiratory failure   Critical care was time spent personally by me on the following activities:  Development of treatment plan with patient  or surrogate, discussions with consultants, evaluation of patient's response to treatment, examination of patient, obtaining history from patient or surrogate, ordering and performing treatments and interventions, ordering and review of laboratory studies, ordering and review of radiographic studies, pulse oximetry, re-evaluation of patient's condition and review of old charts      Critical Care performed: yes ____________________________________________   INITIAL IMPRESSION /  ASSESSMENT AND PLAN / ED COURSE  Pertinent labs & imaging results that were available during my care of the patient were reviewed by me and considered in my medical decision making (see chart for details).   DDX: Asthma, copd, CHF, pna, ptx, malignancy, Pe, anemia   IJANAE MACAPAGAL is a 75 y.o. who presents to the ED with presentation as described above.  Patient I will provide much additional history.  She does arrive a DNR.  She has and acute hypoxic respiratory failure on nonrebreather.  Blood work checked and shows evidence of hypernatremia.  Will give fluids.  Family reportedly on the way.  Will discuss goals of care.  Clinical Course as of 03/04/2021 1542  Mon Feb 20, 2021  1150 Patient reassessed.  Remains critically ill.  Daughter-in-law at bedside refers patient is a DNR.  Has had steady clinical decline over the past several days poor p.o. intake.  Son who is medical power of attorney is on his way.  Discussed results and concern for significant hypernatremia and pneumonia with hypoxic respiratory failure.  It sounds like family goals of care are primarily comfort measures but agreed to antibiotics and IV fluid [PR]  1211 Family at bedside.  After discussion with the son it sounds like she has been suffering for quite some time and they are likely going to proceed with comfort care but he wants to speak with some family members before deciding whether to withdrawal care. [PR]  9407 Family states that their plan  is to withdraw supplemental oxygen and IV fluids at 4:00 and focus primarily on comfort measures. [PR]  1524 Patient reassessed.  Family visiting with patient plan to keep plan for withdrawal of care and transition to comfort measures only at [place] be transitioned over to oncoming physician.  Family with no additional questions or concerns at this time. [PR]    Clinical Course User Index [PR] Merlyn Lot, MD    The patient was evaluated in Emergency Department today for the symptoms described in the history of present illness. He/she was evaluated in the context of the global COVID-19 pandemic, which necessitated consideration that the patient might be at risk for infection with the SARS-CoV-2 virus that causes COVID-19. Institutional protocols and algorithms that pertain to the evaluation of patients at risk for COVID-19 are in a state of rapid change based on information released by regulatory bodies including the CDC and federal and state organizations. These policies and algorithms were followed during the patient's care in the ED.  As part of my medical decision making, I reviewed the following data within the [place] notes reviewed and incorporated, Labs reviewed, notes from prior ED visits and [place] Controlled Substance Database   ____________________________________________   FINAL CLINICAL IMPRESSION(S) / ED DIAGNOSES  Final diagnoses:  Acute respiratory failure with hypoxia ([place])  Acute hypernatremia      NEW MEDICATIONS STARTED DURING THIS VISIT:  New Prescriptions   No medications on file     Note:  This document was prepared using Dragon voice recognition software and may include unintentional dictation errors.    Merlyn Lot, MD 03/11/2021 [phone]

## 2021-02-20 NOTE — ED Notes (Signed)
Pt noted to be restless. 2mg  morphine administered as ordered. Pt was cleaned and repositioned.

## 2021-02-20 NOTE — ED Notes (Signed)
Family at bedside. Offered comfort measures. Offered pain management options. EDP has spoken with family members several times. No needs expressed at this time by pt or family. Placed call bell within reach.

## 2021-02-20 NOTE — ED Notes (Signed)
PT resting comfortably with eyes closed. No distress noted. Family at the bedside.

## 2021-02-20 NOTE — ED Provider Notes (Addendum)
I assumed care of this patient approximately 1500.  Please see of progress note for full details guarding patient's initial evaluation assessment.  In brief patient presents with history of stroke and is nonverbal with worsening shortness of breath and evidence of hypoxic respiratory failure on arrival.  She is currently DNR on high flow oxygen and plan is to transition to full comfort care with withdrawal of oxygen supplementation at 4 PM.  After signout I did introduce my family at bedside to confirm this is the plan.  They are aware oxygen will be withdrawn at 4 PM.  Oxygen was drawn at 4 PM.  Patient given analgesia and anxiolytic per [facility].  We will continue to observe and hold off on admission as I expect patient will likely pass in the next several hours.  After approximately 11 hours in the emergency room patient is still alive but with vitals trending towards death.  Will admit to hospitalist service for further management.   Lucrezia Starch, MD 03-19-2021 1634    Lucrezia Starch, MD 19-Mar-2021 2153

## 2021-02-20 NOTE — Progress Notes (Signed)
HFNC was removed per Dr.'s orders.

## 2021-02-20 NOTE — ED Notes (Signed)
RT at bedside to disconnect HFNC.

## 2021-02-20 NOTE — Consult Note (Signed)
PHARMACY -  BRIEF ANTIBIOTIC NOTE   Pharmacy has received consult(s) for Vancomycin & Cefepime from an ED provider.  The patient's profile has been reviewed for ht/wt/allergies/indication/available labs.    One time order(s) placed for: Vancomycin 1.5g x1 Cefepime 2g x1  Also, received Azithro IV 500mg  x1   Further antibiotics/pharmacy consults should be ordered by admitting physician if indicated.                       Thank you, Lorna Dibble 02/21/2021  11:03 AM

## 2021-02-20 NOTE — ED Notes (Signed)
Family members at bedside. No needs expressed at this time.

## 2021-02-21 ENCOUNTER — Other Ambulatory Visit: Payer: Self-pay

## 2021-02-21 DIAGNOSIS — G9341 Metabolic encephalopathy: Secondary | ICD-10-CM | POA: Diagnosis present

## 2021-02-21 DIAGNOSIS — J189 Pneumonia, unspecified organism: Secondary | ICD-10-CM | POA: Diagnosis present

## 2021-02-21 DIAGNOSIS — I69351 Hemiplegia and hemiparesis following cerebral infarction affecting right dominant side: Secondary | ICD-10-CM | POA: Diagnosis not present

## 2021-02-21 DIAGNOSIS — E870 Hyperosmolality and hypernatremia: Secondary | ICD-10-CM | POA: Diagnosis present

## 2021-02-21 DIAGNOSIS — Z7901 Long term (current) use of anticoagulants: Secondary | ICD-10-CM | POA: Diagnosis not present

## 2021-02-21 DIAGNOSIS — R6520 Severe sepsis without septic shock: Secondary | ICD-10-CM | POA: Diagnosis present

## 2021-02-21 DIAGNOSIS — I69320 Aphasia following cerebral infarction: Secondary | ICD-10-CM | POA: Diagnosis not present

## 2021-02-21 DIAGNOSIS — A419 Sepsis, unspecified organism: Secondary | ICD-10-CM | POA: Diagnosis present

## 2021-02-21 DIAGNOSIS — R130 Aphagia: Secondary | ICD-10-CM | POA: Diagnosis present

## 2021-02-21 DIAGNOSIS — Z66 Do not resuscitate: Secondary | ICD-10-CM | POA: Diagnosis present

## 2021-02-21 DIAGNOSIS — Z515 Encounter for palliative care: Secondary | ICD-10-CM | POA: Diagnosis not present

## 2021-02-21 DIAGNOSIS — Z888 Allergy status to other drugs, medicaments and biological substances status: Secondary | ICD-10-CM | POA: Diagnosis not present

## 2021-02-21 DIAGNOSIS — Z7401 Bed confinement status: Secondary | ICD-10-CM | POA: Diagnosis not present

## 2021-02-21 DIAGNOSIS — I10 Essential (primary) hypertension: Secondary | ICD-10-CM | POA: Diagnosis present

## 2021-02-21 DIAGNOSIS — Z20822 Contact with and (suspected) exposure to covid-19: Secondary | ICD-10-CM | POA: Diagnosis present

## 2021-02-21 DIAGNOSIS — J9601 Acute respiratory failure with hypoxia: Secondary | ICD-10-CM

## 2021-02-21 DIAGNOSIS — J45909 Unspecified asthma, uncomplicated: Secondary | ICD-10-CM | POA: Diagnosis present

## 2021-02-21 DIAGNOSIS — Z79899 Other long term (current) drug therapy: Secondary | ICD-10-CM | POA: Diagnosis not present

## 2021-02-21 DIAGNOSIS — E785 Hyperlipidemia, unspecified: Secondary | ICD-10-CM | POA: Diagnosis present

## 2021-02-21 DIAGNOSIS — C9001 Multiple myeloma in remission: Secondary | ICD-10-CM | POA: Diagnosis present

## 2021-02-21 DIAGNOSIS — Z886 Allergy status to analgesic agent status: Secondary | ICD-10-CM | POA: Diagnosis not present

## 2021-02-21 DIAGNOSIS — I480 Paroxysmal atrial fibrillation: Secondary | ICD-10-CM | POA: Diagnosis present

## 2021-02-21 NOTE — Progress Notes (Addendum)
Funeral Home Info provided by son Ruth Sanchez [facility] [phone]

## 2021-02-21 NOTE — Progress Notes (Signed)
PROGRESS NOTE    Ruth Sanchez  FBP:[id number] DOB: 1946-10-18 DOA: 02/27/2021 PCP: [name], MD    Brief Narrative:  Ruth Sanchez is a 75 year old female with past medical history significant for multiple CVAs with hemiparesis who presented to [facility] ED with nausea, vomiting with possible aspiration.  Nonverbal at baseline.  Was started on IV antibiotics at SNF.  On presentation had SPO2 85% was placed on 100% NRB.  Patient also was noted to have A. fib with RVR to 150.  Family at bedside expressed desire to transition patient's care to comfort measures to admitting physician.  Hospital service consulted for further evaluation and management of comfort/end-of-life care.   Assessment & Plan:   Active Problems:   Paroxysmal atrial fibrillation (HCC)   Acute respiratory failure with hypoxia (HCC)   Sepsis ([place])   Aphagia   CAP (community acquired pneumonia)   Acute hypoxic respiratory failure, POA Severe sepsis, POA Community-acquired versus aspiration pneumonia Patient presenting to [facility] ED from SNF following nausea/vomiting with likely aspiration.  Was noted to be hypoxic with SPO2 77% on room air.  Chest x-ray consistent with right lower lobe infiltrate.  Cova-19 PCR negative.  Lactic acid 2.1.  WBC count 11.4.  Tachypneic with respirations 28.  Received one-time dose of empiric antibiotics with cefepime, azithromycin, and vancomycin in ED.  Patient's family at bedside during initial ED stabilization and requested transition to comfort measures given her comorbidities and significant respiratory distress and decompensation. -- Tylenol as needed mild pain/fever -- Robinul every 4 hours as needed excessive secretions -- Haldol every 4 hours as needed agitation -- Ativan every 4 hours as needed anxiety -- Morphine IV/p.o. as needed for pain, shortness of breath -- TOC consultation for residential hospice placement -- Grim prognosis   DVT prophylaxis:    Code Status:  DNR Family Communication:   Disposition Plan:  Level of care: Med-Surg Status is: Inpatient  Remains inpatient appropriate because:Altered mental status, Unsafe d/c plan, IV treatments appropriate due to intensity of illness or inability to take PO and Inpatient level of care appropriate due to severity of illness   Dispo: The patient is from: Home              Anticipated d/c is to: Residential hospice versus in-hospital death              Patient currently is medically stable to d/c.   Difficult to place patient No   Consultants:   None  Procedures:   None  Antimicrobials:   Vancomycin, cefepime, azithromycin x 1 dose 5/2   Subjective: Patient seen and examined at bedside, resting comfortably.  Sleeping, calm.  Multiple family members present.  No acute concerns currently per family members.  Appreciate all the care they have been receiving in the hospital.  Unable to obtain any further ROS from patient given her current mental status.  No concerns per nursing staff this morning  Objective: Vitals:   03/12/2021 1915 02/21/21 0030 02/21/21 0833 02/21/21 1124  BP:  93/62 92/66 103/74  Pulse: (!) 127 (!) 142 (!) 107 (!) 129  Resp: (!) 35 (!) 28  16  Temp:  98.8 F (37.1 C)  99.9 F (37.7 C)  TempSrc:  Axillary    SpO2: (!) 78% (!) 84% (!) 76% (!) 72%  Weight:      Height:        Intake/Output Summary (Last 24 hours) at 02/21/2021 1410 Last data filed at 03/06/2021 1625 Gross per  24 hour  Intake 1500 ml  Output --  Net 1500 ml   Filed Weights   2021/02/23 1032  Weight: 67.5 kg    Examination:  General exam: Appears calm and comfortable, no acute distress, chronically ill in appearance, obtunded Respiratory system: Breath sounds decreased bilateral bases, with crackles, normal respiratory effort, on 2 L nasal cannula Cardiovascular system: S1 & S2 heard, RRR. No JVD, murmurs, rubs, gallops or clicks. No pedal edema. Gastrointestinal system: Abdomen is  nondistended, soft and nontender. No organomegaly or masses felt. Normal bowel sounds heard. Central nervous system: Somnolent, not following commands Extremities: No peripheral edema Skin: No rashes, lesions or ulcers Psychiatry: Unable to assess given current mental status    Data Reviewed: I have personally reviewed following labs and imaging studies  CBC: Recent Labs  Lab 2021-02-23 1049  WBC 11.4*  NEUTROABS 9.2*  HGB 14.4  HCT 44.6  MCV 87.3  PLT 202   Basic Metabolic Panel: Recent Labs  Lab 23-Feb-2021 1049  NA 158*  K 2.9*  CL 128*  CO2 21*  GLUCOSE 132*  BUN 62*  CREATININE 1.04*  CALCIUM 8.5*   GFR: Estimated Creatinine Clearance: 39.4 mL/min (A) (by C-G formula based on SCr of 1.04 mg/dL (H)). Liver Function Tests: Recent Labs  Lab 23-Feb-2021 1049  AST 29  ALT 19  ALKPHOS 67  BILITOT 1.5*  PROT 7.7  ALBUMIN 2.7*   No results for input(s): LIPASE, AMYLASE in the last 168 hours. No results for input(s): AMMONIA in the last 168 hours. Coagulation Profile: No results for input(s): INR, PROTIME in the last 168 hours. Cardiac Enzymes: No results for input(s): CKTOTAL, CKMB, CKMBINDEX, TROPONINI in the last 168 hours. BNP (last 3 results) No results for input(s): PROBNP in the last 8760 hours. HbA1C: No results for input(s): HGBA1C in the last 72 hours. CBG: No results for input(s): GLUCAP in the last 168 hours. Lipid Profile: No results for input(s): CHOL, HDL, LDLCALC, TRIG, CHOLHDL, LDLDIRECT in the last 72 hours. Thyroid Function Tests: No results for input(s): TSH, T4TOTAL, FREET4, T3FREE, THYROIDAB in the last 72 hours. Anemia Panel: No results for input(s): VITAMINB12, FOLATE, FERRITIN, TIBC, IRON, RETICCTPCT in the last 72 hours. Sepsis Labs: Recent Labs  Lab 02-23-21 1049  LATICACIDVEN 2.1*    Recent Results (from the past 240 hour(s))  Blood Culture (routine x 2)     Status: None (Preliminary result)   Collection Time: Feb 23, 2021 10:49 AM    Specimen: BLOOD  Result Value Ref Range Status   Specimen Description BLOOD BLOOD LEFT FOREARM  Final   Special Requests   Final    BOTTLES DRAWN AEROBIC AND ANAEROBIC Blood Culture adequate volume   Culture   Final    NO GROWTH < 24 HOURS Performed at [facility], [address] [place], [place] [address]    Report Status PENDING  Incomplete  Blood Culture (routine x 2)     Status: None (Preliminary result)   Collection Time: 02-23-21 11:35 AM   Specimen: BLOOD  Result Value Ref Range Status   Specimen Description BLOOD BLOOD LEFT FOREARM  Final   Special Requests   Final    BOTTLES DRAWN AEROBIC AND ANAEROBIC Blood Culture results may not be optimal due to an inadequate volume of blood received in culture bottles   Culture   Final    NO GROWTH < 24 HOURS Performed at [facility], [address] [place], [place] [address]    Report  Status PENDING  Incomplete  Resp Panel by RT-PCR (Flu A&B, Covid) Nasopharyngeal Swab     Status: None   Collection Time: 02/25/2021 10:00 PM   Specimen: Nasopharyngeal Swab; Nasopharyngeal(NP) swabs in vial transport medium  Result Value Ref Range Status   SARS Coronavirus 2 by RT PCR NEGATIVE NEGATIVE Final    Comment: (NOTE) SARS-CoV-2 target nucleic acids are NOT DETECTED.  The SARS-CoV-2 RNA is generally detectable in upper respiratory specimens during the acute phase of infection. The lowest concentration of SARS-CoV-2 viral copies this assay can detect is 138 copies/mL. A negative result does not preclude SARS-Cov-2 infection and should not be used as the sole basis for treatment or other patient management decisions. A negative result may occur with  improper specimen collection/handling, submission of specimen other than nasopharyngeal swab, presence of viral mutation(s) within the areas targeted by this assay, and inadequate number of viral copies(<138 copies/mL). A negative result must be combined with clinical  observations, patient history, and epidemiological information. The expected result is Negative.  Fact Sheet for Patients:  EntrepreneurPulse.com.au  Fact Sheet for Healthcare Providers:  IncredibleEmployment.be  This test is no t yet approved or cleared by the [place] FDA and  has been authorized for detection and/or diagnosis of SARS-CoV-2 by FDA under an Emergency Use Authorization (EUA). This EUA will remain  in effect (meaning this test can be used) for the duration of the COVID-19 declaration under Section 564(b)(1) of the Act, 21 U.S.C.section 360bbb-3(b)(1), unless the authorization is terminated  or revoked sooner.       Influenza A by PCR NEGATIVE NEGATIVE Final   Influenza B by PCR NEGATIVE NEGATIVE Final    Comment: (NOTE) The Xpert Xpress SARS-CoV-2/FLU/RSV plus assay is intended as an aid in the diagnosis of influenza from Nasopharyngeal swab specimens and should not be used as a sole basis for treatment. Nasal washings and aspirates are unacceptable for Xpert Xpress SARS-CoV-2/FLU/RSV testing.  Fact Sheet for Patients: EntrepreneurPulse.com.au  Fact Sheet for Healthcare Providers: IncredibleEmployment.be  This test is not yet approved or cleared by the [place] FDA and has been authorized for detection and/or diagnosis of SARS-CoV-2 by FDA under an Emergency Use Authorization (EUA). This EUA will remain in effect (meaning this test can be used) for the duration of the COVID-19 declaration under Section 564(b)(1) of the Act, 21 U.S.C. section 360bbb-3(b)(1), unless the authorization is terminated or revoked.  Performed at [facility], [address], [place], [place] [address]          Radiology Studies: DG Chest Port 1 View  Result Date: 03/19/2021 CLINICAL DATA:  Pneumonia EXAM: PORTABLE CHEST 1 VIEW COMPARISON:  Chest radiograph July 02, 2018; chest CT  August 31, 2016 FINDINGS: Patient is significantly rotated. There is airspace consolidation in the right lower lung region. Suspect nipple shadow on the left. Left lung otherwise clear. Overall heart size is upper normal, stable, with pulmonary vascularity normal. No adenopathy. Stable fracture prior right fourth rib. Thyroid enlargement suspected, better seen on 2017 CT. IMPRESSION: Airspace opacity consistent with pneumonia right lower lung region. Right lung otherwise clear. Suspect nipple shadow on the left. It may be prudent to consider repeat study with nipple markers. Left lung otherwise clear. Stable cardiac silhouette. Note that patient is considerably rotated, similar to prior study. Suspect left-sided thyroid enlargement. Electronically Signed   By: Lowella Grip III M.D.   On: 02/28/2021 11:28        Scheduled Meds: . sodium chloride  flush  3 mL Intravenous Q12H   Continuous Infusions: . sodium chloride       LOS: 0 days    Time spent: 39 minutes spent on chart review, discussion with nursing staff, consultants, updating family and interview/physical exam; more than 50% of that time was spent in counseling and/or coordination of care.    Eisley Barber J [place] DO [facility] Available via Epic secure chat 7am-7pm After these hours, please refer to coverage provider listed on [email or web address] 02/21/2021, 2:10 PM

## 2021-02-21 NOTE — Progress Notes (Signed)
     Referral received for Ruth Sanchez :goals of care discussion. Chart reviewed. Family has clear goals and during the night patient was transitioned to full comfort care for end-of-life by attending.   Discussed with Dr. [place] and no further Palliative needs. Will sign-off.    Alda Lea, AGPCNP-BC Palliative Medicine Team  Phone: [phone] Amion: N. [place]   NO CHARGE

## 2021-02-21 NOTE — Progress Notes (Addendum)
Pt arrived via bed on RA in respiratory distress. Pt unresponsive, not opening eyes to pain. Pt attempting to cough but unable to. Suction set up in place and dark brown thick secretions noted. Pt placed on 2L for comfort. Son at bedside. Orientation to room, staff and unit performed.

## 2021-02-21 NOTE — Plan of Care (Signed)
  Problem: Education: Goal: Knowledge of General Education information will improve Description: Including pain rating scale, medication(s)/side effects and non-pharmacologic comfort measures 02/21/2021 1829 by Vivien Rota, RN Outcome: Progressing 02/21/2021 1829 by Vivien Rota, RN Outcome: Progressing   Problem: Health Behavior/Discharge Planning: Goal: Ability to manage health-related needs will improve 02/21/2021 1829 by Vivien Rota, RN Outcome: Progressing 02/21/2021 [place] by Vivien Rota, RN Outcome: Progressing   Problem: Clinical Measurements: Goal: Ability to maintain clinical measurements within normal limits will improve 02/21/2021 1829 by Vivien Rota, RN Outcome: Progressing 02/21/2021 1829 by Vivien Rota, RN Outcome: Progressing Goal: Will remain free from infection 02/21/2021 1829 by Vivien Rota, RN Outcome: Progressing 02/21/2021 1829 by Vivien Rota, RN Outcome: Progressing Goal: Diagnostic test results will improve 02/21/2021 1829 by Vivien Rota, RN Outcome: Progressing 02/21/2021 1829 by Vivien Rota, RN Outcome: Progressing Goal: Respiratory complications will improve 02/21/2021 1829 by Vivien Rota, RN Outcome: Progressing 02/21/2021 [place] by Vivien Rota, RN Outcome: Progressing Goal: Cardiovascular complication will be avoided 02/21/2021 1829 by Vivien Rota, RN Outcome: Progressing 02/21/2021 1829 by Vivien Rota, RN Outcome: Progressing   Problem: Activity: Goal: Risk for activity intolerance will decrease 02/21/2021 1829 by Vivien Rota, RN Outcome: Progressing 02/21/2021 1829 by Vivien Rota, RN Outcome: Progressing   Problem: Nutrition: Goal: Adequate nutrition will be maintained 02/21/2021 1829 by Vivien Rota, RN Outcome: Progressing 02/21/2021 [place] by Vivien Rota, RN Outcome: Progressing   Problem: Coping: Goal: Level  of anxiety will decrease 02/21/2021 1829 by Vivien Rota, RN Outcome: Progressing 02/21/2021 1829 by Vivien Rota, RN Outcome: Progressing   Problem: Elimination: Goal: Will not experience complications related to bowel motility 02/21/2021 1829 by Vivien Rota, RN Outcome: Progressing 02/21/2021 1829 by Vivien Rota, RN Outcome: Progressing Goal: Will not experience complications related to urinary retention 02/21/2021 1829 by Vivien Rota, RN Outcome: Progressing 02/21/2021 1829 by Vivien Rota, RN Outcome: Progressing   Problem: Pain Managment: Goal: General experience of comfort will improve 02/21/2021 1829 by Vivien Rota, RN Outcome: Progressing 02/21/2021 [place] by Vivien Rota, RN Outcome: Progressing   Problem: Safety: Goal: Ability to remain free from injury will improve 02/21/2021 1829 by Vivien Rota, RN Outcome: Progressing 02/21/2021 1829 by Vivien Rota, RN Outcome: Progressing   Problem: Skin Integrity: Goal: Risk for impaired skin integrity will decrease 02/21/2021 1829 by Vivien Rota, RN Outcome: Progressing 02/21/2021 1829 by Vivien Rota, RN Outcome: Progressing

## 2021-02-21 NOTE — Plan of Care (Signed)
Pt breathing calmly and looks comfortable. Son at bedside. Admission questions answered by son. Safety measures in place. Will continue to monitor.  Problem: Education: Goal: Knowledge of General Education information will improve Description: Including pain rating scale, medication(s)/side effects and non-pharmacologic comfort measures Outcome: Progressing   Problem: Health Behavior/Discharge Planning: Goal: Ability to manage health-related needs will improve Outcome: Progressing   Problem: Clinical Measurements: Goal: Ability to maintain clinical measurements within normal limits will improve Outcome: Progressing Goal: Will remain free from infection Outcome: Progressing Goal: Diagnostic test results will improve Outcome: Progressing Goal: Respiratory complications will improve Outcome: Progressing Goal: Cardiovascular complication will be avoided Outcome: Progressing   Problem: Activity: Goal: Risk for activity intolerance will decrease Outcome: Progressing   Problem: Nutrition: Goal: Adequate nutrition will be maintained Outcome: Progressing   Problem: Coping: Goal: Level of anxiety will decrease Outcome: Progressing   Problem: Elimination: Goal: Will not experience complications related to bowel motility Outcome: Progressing Goal: Will not experience complications related to urinary retention Outcome: Progressing   Problem: Safety: Goal: Ability to remain free from injury will improve Outcome: Progressing   Problem: Skin Integrity: Goal: Risk for impaired skin integrity will decrease Outcome: Progressing   Problem: Skin Integrity: Goal: Risk for impaired skin integrity will decrease Outcome: Progressing

## 2021-02-22 DIAGNOSIS — J189 Pneumonia, unspecified organism: Secondary | ICD-10-CM | POA: Diagnosis not present

## 2021-02-22 DIAGNOSIS — J9601 Acute respiratory failure with hypoxia: Secondary | ICD-10-CM | POA: Diagnosis not present

## 2021-02-22 DIAGNOSIS — Z515 Encounter for palliative care: Secondary | ICD-10-CM | POA: Diagnosis not present

## 2021-02-25 LAB — CULTURE, BLOOD (ROUTINE X 2)
Culture: NO GROWTH
Culture: NO GROWTH
Special Requests: ADEQUATE

## 2021-03-22 NOTE — Progress Notes (Signed)
Patient passed away @1430 , verified by two nurse, myself and Sonda Primes, RN, Patient family in the room. Dr. Ree Kida via secure chat. [place] [facility] called and Sharol Given have provided referral number [phone].

## 2021-03-22 NOTE — Progress Notes (Signed)
[facility]) Hospital Liaison RN note:  Received request from Isaias Cowman, [facility] for family interest in [place]. Chart is under review and eligibility is pending. Unfortunately, Hospice Home is not able to offer a room today. Hospital care team is aware. [facility] Liaison will monitor for room availability.  Please call with any hospice related questions or concerns.  Zandra Abts, RN [facility] Liaison  [phone]

## 2021-03-22 NOTE — TOC Initial Note (Signed)
Transition of Care [facility]) - Initial/Assessment Note    Patient Details  Name: Ruth Sanchez MRN: [id number] Date of Birth: 08-Jan-1946  Transition [facility]) CM/SW Contact:    Beverly Sessions, RN Phone Number: 07-Mar-2021, 1:27 PM  Clinical Narrative:                 Patient admitted from [place] Patient full comfort care Met with patient's son at bedside.  He wishes to proceed with hospice home referral and his preference is [place].  Referral made to [facility] with Manufacturing engineer.  No beds available today.          Patient Goals and CMS Choice        Expected Discharge Plan and Services                                                Prior Living Arrangements/Services                       Activities of Daily Living Home Assistive Devices/Equipment: Other (Comment) (special padded chair at NH,) ADL Screening (condition at time of admission) Patient's cognitive ability adequate to safely complete daily activities?: No Is the patient deaf or have difficulty hearing?: Yes Does the patient have difficulty seeing, even when wearing glasses/contacts?: Yes Does the patient have difficulty concentrating, remembering, or making decisions?: Yes Patient able to express need for assistance with ADLs?: No Does the patient have difficulty dressing or bathing?: Yes Independently performs ADLs?: No Communication: Dependent Is this a change from baseline?: Pre-admission baseline Dressing (OT): Dependent,Independent Is this a change from baseline?: Change from baseline, expected to last >3 days Grooming: Dependent Is this a change from baseline?: Change from baseline, expected to last >3 days Feeding: Dependent Is this a change from baseline?: Change from baseline, expected to last >3 days Bathing: Dependent Is this a change from baseline?: Change from baseline, expected to last >3 days Toileting: Dependent Is this a change  from baseline?: Change from baseline, expected to last >3days In/Out Bed: Dependent Is this a change from baseline?: Change from baseline, expected to last >3 days Walks in Home: Dependent Is this a change from baseline?: Change from baseline, expected to last >3 days Does the patient have difficulty walking or climbing stairs?: Yes Weakness of Legs: Right (paralysis) Weakness of Arms/Hands: Right (paralysis)  Permission Sought/Granted                  Emotional Assessment              Admission diagnosis:  Acute hypernatremia [E87.0] Acute respiratory failure with hypoxia ([place]) [J96.01] Sepsis ([place]) [A41.9] Patient Active Problem List   Diagnosis Date Noted  . Sepsis ([place]) 02/21/2021  . Aphagia 02/21/2021  . CAP (community acquired pneumonia) 02/21/2021  . Acute respiratory failure with hypoxia ([place]) 03/18/2021  . Right groin wound 07/08/2018  . Dysphagia due to recent cerebrovascular accident 07/08/2018  . Leukocytosis 07/08/2018  . Acute ischemic stroke [facility]) s/p attempted mechanical thrombectomy 06/30/2018  . Middle cerebral artery embolism, left 06/30/2018  . Chronic diarrhea 03/29/2017  . Urinary incontinence 08/03/2015  . Essential hypertension 05/13/2015  . Chronic back pain 05/13/2015  . Acute bronchitis 02/23/2013  . Dyspnea 02/23/2013  . Paroxysmal atrial fibrillation ([place]) 02/23/2013  . Arthritis 02/23/2013  .  Goiter with tracheal compression and mild stridor 02/23/2013  . Asthma, chronic 02/23/2013  . Hypokalemia 02/23/2013  . Multiple myeloma in remission ([place]) 11/18/2011   PCP:  [name], MD Pharmacy:   [facility] [place], [place] - [address] [place] [address] Phone: [phone] Fax: [phone]     Social Determinants of Health (SDOH) Interventions    Readmission Risk Interventions No flowsheet data found.

## 2021-03-22 NOTE — Plan of Care (Signed)
Pt received pain meds overnight for dyspnea/increased RR. Pt repositioned and mouth care provided. Family at bedside. Pt comfortably resting at the end of the shift. Safety measures in place. Will continue to monitor.   Problem: Education: Goal: Knowledge of General Education information will improve Description: Including pain rating scale, medication(s)/side effects and non-pharmacologic comfort measures Outcome: Progressing   Problem: Health Behavior/Discharge Planning: Goal: Ability to manage health-related needs will improve Outcome: Progressing   Problem: Clinical Measurements: Goal: Ability to maintain clinical measurements within normal limits will improve Outcome: Progressing Goal: Will remain free from infection Outcome: Progressing Goal: Diagnostic test results will improve Outcome: Progressing Goal: Respiratory complications will improve Outcome: Progressing Goal: Cardiovascular complication will be avoided Outcome: Progressing   Problem: Activity: Goal: Risk for activity intolerance will decrease Outcome: Progressing   Problem: Nutrition: Goal: Adequate nutrition will be maintained Outcome: Progressing   Problem: Coping: Goal: Level of anxiety will decrease Outcome: Progressing   Problem: Elimination: Goal: Will not experience complications related to bowel motility Outcome: Progressing Goal: Will not experience complications related to urinary retention Outcome: Progressing   Problem: Pain Managment: Goal: General experience of comfort will improve Outcome: Progressing   Problem: Safety: Goal: Ability to remain free from injury will improve Outcome: Progressing   Problem: Skin Integrity: Goal: Risk for impaired skin integrity will decrease Outcome: Progressing

## 2021-03-22 NOTE — Progress Notes (Signed)
Per provider no vitals needed.

## 2021-03-22 NOTE — Progress Notes (Addendum)
Patient ID: Ruth Sanchez, female   DOB: 12-03-1945, 75 y.o.   MRN: [id number]  PROGRESS NOTE    DELANIE TIRRELL  ZSW:[id number] DOB: 1946-08-17 DOA: 2021-03-05 PCP: [name], MD   Brief Narrative:  75 year old female with past medical history significant for multiple CVAs with hemiparesis who presented to [facility] ED with nausea, vomiting with possible aspiration.  On presentation, she required nonrebreather and was found to have A. fib with RVR.  She was started on IV antibiotics for pneumonia.  Due to overall very poor prognosis, she was transitioned to comfort measures after family agreed for the same.  Assessment & Plan:   Acute hypoxic respiratory failure: Present on admission Severe sepsis: Present on admission Community-acquired pneumonia versus aspiration pneumonia Comfort measures only status -Continue comfort measures.  TOC has been consulted for residential hospice placement.   DVT prophylaxis: None for comfort measures Code Status: DNR Family Communication: Son at bedside Disposition Plan: Status is: Inpatient  Remains inpatient appropriate because:Inpatient level of care appropriate due to severity of illness   Dispo:  Patient From: [place]  Planned Disposition: Residential Hospice  Medically stable for discharge: Yes    Consultants: None  Procedures: None  Antimicrobials:  Antibiotics have been discontinued  Subjective: Patient seen and examined at bedside.  She is resting comfortably.  Objective: Vitals:   03-05-2021 1915 02/21/21 0030 02/21/21 0833 02/21/21 1124  BP:  93/62 92/66 103/74  Pulse: (!) 127 (!) 142 (!) 107 (!) 129  Resp: (!) 35 (!) 28  16  Temp:  98.8 F (37.1 C)  99.9 F (37.7 C)  TempSrc:  Axillary    SpO2: (!) 78% (!) 84% (!) 76% (!) 72%  Weight:      Height:        Intake/Output Summary (Last 24 hours) at 03/01/2021 1112 Last data filed at 03/06/2021 1055 Gross per 24 hour  Intake 0 ml  Output 200 ml  Net -200  ml   Filed Weights   2021/03/05 1032  Weight: 67.5 kg    Examination:  General exam: Chronically ill looking female lying in bed.  Currently in no distress.  Looks comfortable  Data Reviewed: I have personally reviewed following labs and imaging studies  CBC: Recent Labs  Lab 03/05/2021 1049  WBC 11.4*  NEUTROABS 9.2*  HGB 14.4  HCT 44.6  MCV 87.3  PLT 322   Basic Metabolic Panel: Recent Labs  Lab Mar 05, 2021 1049  NA 158*  K 2.9*  CL 128*  CO2 21*  GLUCOSE 132*  BUN 62*  CREATININE 1.04*  CALCIUM 8.5*   GFR: Estimated Creatinine Clearance: 39.4 mL/min (A) (by C-G formula based on SCr of 1.04 mg/dL (H)). Liver Function Tests: Recent Labs  Lab 03-05-21 1049  AST 29  ALT 19  ALKPHOS 67  BILITOT 1.5*  PROT 7.7  ALBUMIN 2.7*   No results for input(s): LIPASE, AMYLASE in the last 168 hours. No results for input(s): AMMONIA in the last 168 hours. Coagulation Profile: No results for input(s): INR, PROTIME in the last 168 hours. Cardiac Enzymes: No results for input(s): CKTOTAL, CKMB, CKMBINDEX, TROPONINI in the last 168 hours. BNP (last 3 results) No results for input(s): PROBNP in the last 8760 hours. HbA1C: No results for input(s): HGBA1C in the last 72 hours. CBG: No results for input(s): GLUCAP in the last 168 hours. Lipid Profile: No results for input(s): CHOL, HDL, LDLCALC, TRIG, CHOLHDL, LDLDIRECT in the last 72 hours. Thyroid Function Tests: No  results for input(s): TSH, T4TOTAL, FREET4, T3FREE, THYROIDAB in the last 72 hours. Anemia Panel: No results for input(s): VITAMINB12, FOLATE, FERRITIN, TIBC, IRON, RETICCTPCT in the last 72 hours. Sepsis Labs: Recent Labs  Lab Mar 10, 2021 1049  LATICACIDVEN 2.1*    Recent Results (from the past 240 hour(s))  Blood Culture (routine x 2)     Status: None (Preliminary result)   Collection Time: Mar 10, 2021 10:49 AM   Specimen: BLOOD  Result Value Ref Range Status   Specimen Description BLOOD BLOOD LEFT FOREARM   Final   Special Requests   Final    BOTTLES DRAWN AEROBIC AND ANAEROBIC Blood Culture adequate volume   Culture   Final    NO GROWTH 2 DAYS Performed at [facility], [address] [place], [place] [address]    Report Status PENDING  Incomplete  Blood Culture (routine x 2)     Status: None (Preliminary result)   Collection Time: 2021-03-10 11:35 AM   Specimen: BLOOD  Result Value Ref Range Status   Specimen Description BLOOD BLOOD LEFT FOREARM  Final   Special Requests   Final    BOTTLES DRAWN AEROBIC AND ANAEROBIC Blood Culture results may not be optimal due to an inadequate volume of blood received in culture bottles   Culture   Final    NO GROWTH 2 DAYS Performed at [facility], [address] [place], [place] [address]    Report Status PENDING  Incomplete  Resp Panel by RT-PCR (Flu A&B, Covid) Nasopharyngeal Swab     Status: None   Collection Time: 2021-03-10 10:00 PM   Specimen: Nasopharyngeal Swab; Nasopharyngeal(NP) swabs in vial transport medium  Result Value Ref Range Status   SARS Coronavirus 2 by RT PCR NEGATIVE NEGATIVE Final    Comment: (NOTE) SARS-CoV-2 target nucleic acids are NOT DETECTED.  The SARS-CoV-2 RNA is generally detectable in upper respiratory specimens during the acute phase of infection. The lowest concentration of SARS-CoV-2 viral copies this assay can detect is 138 copies/mL. A negative result does not preclude SARS-Cov-2 infection and should not be used as the sole basis for treatment or other patient management decisions. A negative result may occur with  improper specimen collection/handling, submission of specimen other than nasopharyngeal swab, presence of viral mutation(s) within the areas targeted by this assay, and inadequate number of viral copies(<138 copies/mL). A negative result must be combined with clinical observations, patient history, and epidemiological information. The expected result is Negative.  Fact  Sheet for Patients:  EntrepreneurPulse.com.au  Fact Sheet for Healthcare Providers:  IncredibleEmployment.be  This test is no t yet approved or cleared by the [place] FDA and  has been authorized for detection and/or diagnosis of SARS-CoV-2 by FDA under an Emergency Use Authorization (EUA). This EUA will remain  in effect (meaning this test can be used) for the duration of the COVID-19 declaration under Section 564(b)(1) of the Act, 21 U.S.C.section 360bbb-3(b)(1), unless the authorization is terminated  or revoked sooner.       Influenza A by PCR NEGATIVE NEGATIVE Final   Influenza B by PCR NEGATIVE NEGATIVE Final    Comment: (NOTE) The Xpert Xpress SARS-CoV-2/FLU/RSV plus assay is intended as an aid in the diagnosis of influenza from Nasopharyngeal swab specimens and should not be used as a sole basis for treatment. Nasal washings and aspirates are unacceptable for Xpert Xpress SARS-CoV-2/FLU/RSV testing.  Fact Sheet for Patients: EntrepreneurPulse.com.au  Fact Sheet for Healthcare Providers: IncredibleEmployment.be  This test is not yet  approved or cleared by the [place] and has been authorized for detection and/or diagnosis of SARS-CoV-2 by FDA under an Emergency Use Authorization (EUA). This EUA will remain in effect (meaning this test can be used) for the duration of the COVID-19 declaration under Section 564(b)(1) of the Act, 21 U.S.C. section 360bbb-3(b)(1), unless the authorization is terminated or revoked.  Performed at [facility], [address] [place], [place] [address]          Radiology Studies: No results found.      Scheduled Meds: . sodium chloride flush  3 mL Intravenous Q12H   Continuous Infusions: . sodium chloride            Aline August, MD [facility] 03/24/2021, 11:12 AM

## 2021-03-22 NOTE — Progress Notes (Signed)
Mouth care provided to the patient. Per family patient breathing more often now.

## 2021-03-22 NOTE — Plan of Care (Signed)
  Patient is comfort care at this time.  Problem: Education: Goal: Knowledge of General Education information will improve Description: Including pain rating scale, medication(s)/side effects and non-pharmacologic comfort measures Outcome: Not Met (add Reason)   Problem: Health Behavior/Discharge Planning: Goal: Ability to manage health-related needs will improve Outcome: Not Met (add Reason)   Problem: Clinical Measurements: Goal: Ability to maintain clinical measurements within normal limits will improve Outcome: Not Met (add Reason) Goal: Will remain free from infection Outcome: Not Met (add Reason) Goal: Diagnostic test results will improve Outcome: Not Met (add Reason) Goal: Respiratory complications will improve Outcome: Not Met (add Reason) Goal: Cardiovascular complication will be avoided Outcome: Not Met (add Reason)   Problem: Activity: Goal: Risk for activity intolerance will decrease Outcome: Not Met (add Reason)   Problem: Nutrition: Goal: Adequate nutrition will be maintained Outcome: Not Met (add Reason)   Problem: Coping: Goal: Level of anxiety will decrease Outcome: Not Met (add Reason)   Problem: Elimination: Goal: Will not experience complications related to bowel motility Outcome: Not Met (add Reason) Goal: Will not experience complications related to urinary retention Outcome: Not Met (add Reason)   Problem: Pain Managment: Goal: General experience of comfort will improve Outcome: Not Met (add Reason)   Problem: Safety: Goal: Ability to remain free from injury will improve Outcome: Not Met (add Reason)   Problem: Skin Integrity: Goal: Risk for impaired skin integrity will decrease Outcome: Not Met (add Reason)

## 2021-03-22 NOTE — Death Summary Note (Signed)
Death Summary  Ruth Sanchez ZDG:[id number] DOB: 22-Dec-1945 DOA: 2021-02-25  PCP: [name], MD  Admit date: 2021-02-25 Date of Death: 02/27/2021 Time of Death: 80   History of present illness:  75 year old female with past medical history significant formultiple CVAs with hemiparesis who presented to [facility] ED with nausea, vomiting with possible aspiration.  On presentation, she required nonrebreather and was found to have A. fib with RVR.  She was started on IV antibiotics for pneumonia.  Due to overall very poor prognosis, she was transitioned to comfort measures after family agreed for the same.  Patient expired on Feb 27, 2021 at 1430 and family was at bedside.   Final Diagnoses:  Acute hypoxic respiratory failure: Present on admission Severe sepsis: Present on admission Community-acquired pneumonia versus aspiration pneumonia Comfort measures only status   The results of significant diagnostics from this hospitalization (including imaging, microbiology, ancillary and laboratory) are listed below for reference.    Significant Diagnostic Studies: DG Chest Port 1 View  Result Date: 02-25-2021 CLINICAL DATA:  Pneumonia EXAM: PORTABLE CHEST 1 VIEW COMPARISON:  Chest radiograph July 02, 2018; chest CT August 31, 2016 FINDINGS: Patient is significantly rotated. There is airspace consolidation in the right lower lung region. Suspect nipple shadow on the left. Left lung otherwise clear. Overall heart size is upper normal, stable, with pulmonary vascularity normal. No adenopathy. Stable fracture prior right fourth rib. Thyroid enlargement suspected, better seen on 2017 CT. IMPRESSION: Airspace opacity consistent with pneumonia right lower lung region. Right lung otherwise clear. Suspect nipple shadow on the left. It may be prudent to consider repeat study with nipple markers. Left lung otherwise clear. Stable cardiac silhouette. Note that patient is considerably rotated, similar to prior  study. Suspect left-sided thyroid enlargement. Electronically Signed   By: Lowella Grip III M.D.   On: 02/25/2021 11:28    Microbiology: Recent Results (from the past 240 hour(s))  Blood Culture (routine x 2)     Status: None (Preliminary result)   Collection Time: 25-Feb-2021 10:49 AM   Specimen: BLOOD  Result Value Ref Range Status   Specimen Description BLOOD BLOOD LEFT FOREARM  Final   Special Requests   Final    BOTTLES DRAWN AEROBIC AND ANAEROBIC Blood Culture adequate volume   Culture   Final    NO GROWTH 2 DAYS Performed at [facility], [address], [place], [place] [address]    Report Status PENDING  Incomplete  Blood Culture (routine x 2)     Status: None (Preliminary result)   Collection Time: 02/25/2021 11:35 AM   Specimen: BLOOD  Result Value Ref Range Status   Specimen Description BLOOD BLOOD LEFT FOREARM  Final   Special Requests   Final    BOTTLES DRAWN AEROBIC AND ANAEROBIC Blood Culture results may not be optimal due to an inadequate volume of blood received in culture bottles   Culture   Final    NO GROWTH 2 DAYS Performed at [facility], [address], [place], [place] [address]    Report Status PENDING  Incomplete  Resp Panel by RT-PCR (Flu A&B, Covid) Nasopharyngeal Swab     Status: None   Collection Time: Feb 25, 2021 10:00 PM   Specimen: Nasopharyngeal Swab; Nasopharyngeal(NP) swabs in vial transport medium  Result Value Ref Range Status   SARS Coronavirus 2 by RT PCR NEGATIVE NEGATIVE Final    Comment: (NOTE) SARS-CoV-2 target nucleic acids are NOT DETECTED.  The SARS-CoV-2 RNA is generally detectable in upper respiratory specimens  during the acute phase of infection. The lowest concentration of SARS-CoV-2 viral copies this assay can detect is 138 copies/mL. A negative result does not preclude SARS-Cov-2 infection and should not be used as the sole basis for treatment or other patient management decisions. A negative result may  occur with  improper specimen collection/handling, submission of specimen other than nasopharyngeal swab, presence of viral mutation(s) within the areas targeted by this assay, and inadequate number of viral copies(<138 copies/mL). A negative result must be combined with clinical observations, patient history, and epidemiological information. The expected result is Negative.  Fact Sheet for Patients:  EntrepreneurPulse.com.au  Fact Sheet for Healthcare Providers:  IncredibleEmployment.be  This test is no t yet approved or cleared by the [place] FDA and  has been authorized for detection and/or diagnosis of SARS-CoV-2 by FDA under an Emergency Use Authorization (EUA). This EUA will remain  in effect (meaning this test can be used) for the duration of the COVID-19 declaration under Section 564(b)(1) of the Act, 21 U.S.C.section 360bbb-3(b)(1), unless the authorization is terminated  or revoked sooner.       Influenza A by PCR NEGATIVE NEGATIVE Final   Influenza B by PCR NEGATIVE NEGATIVE Final    Comment: (NOTE) The Xpert Xpress SARS-CoV-2/FLU/RSV plus assay is intended as an aid in the diagnosis of influenza from Nasopharyngeal swab specimens and should not be used as a sole basis for treatment. Nasal washings and aspirates are unacceptable for Xpert Xpress SARS-CoV-2/FLU/RSV testing.  Fact Sheet for Patients: EntrepreneurPulse.com.au  Fact Sheet for Healthcare Providers: IncredibleEmployment.be  This test is not yet approved or cleared by the [place] FDA and has been authorized for detection and/or diagnosis of SARS-CoV-2 by FDA under an Emergency Use Authorization (EUA). This EUA will remain in effect (meaning this test can be used) for the duration of the COVID-19 declaration under Section 564(b)(1) of the Act, 21 U.S.C. section 360bbb-3(b)(1), unless the authorization is terminated  or revoked.  Performed at [facility], [address] [place], [place] [address]      Labs: Basic Metabolic Panel: Recent Labs  Lab 02/28/2021 1049  NA 158*  K 2.9*  CL 128*  CO2 21*  GLUCOSE 132*  BUN 62*  CREATININE 1.04*  CALCIUM 8.5*   Liver Function Tests: Recent Labs  Lab 03/21/2021 1049  AST 29  ALT 19  ALKPHOS 67  BILITOT 1.5*  PROT 7.7  ALBUMIN 2.7*   No results for input(s): LIPASE, AMYLASE in the last 168 hours. No results for input(s): AMMONIA in the last 168 hours. CBC: Recent Labs  Lab 03/09/2021 1049  WBC 11.4*  NEUTROABS 9.2*  HGB 14.4  HCT 44.6  MCV 87.3  PLT 166   Cardiac Enzymes: No results for input(s): CKTOTAL, CKMB, CKMBINDEX, TROPONINI in the last 168 hours. D-Dimer No results for input(s): DDIMER in the last 72 hours. BNP: Invalid input(s): POCBNP CBG: No results for input(s): GLUCAP in the last 168 hours. Anemia work up No results for input(s): VITAMINB12, FOLATE, FERRITIN, TIBC, IRON, RETICCTPCT in the last 72 hours. Urinalysis    Component Value Date/Time   COLORURINE YELLOW (A) 07/18/2018 1559   APPEARANCEUR CLOUDY (A) 07/18/2018 1559   LABSPEC 1.016 07/18/2018 1559   PHURINE 6.0 07/18/2018 1559   GLUCOSEU NEGATIVE 07/18/2018 1559   HGBUR NEGATIVE 07/18/2018 1559   BILIRUBINUR NEGATIVE 07/18/2018 1559   KETONESUR NEGATIVE 07/18/2018 1559   PROTEINUR NEGATIVE 07/18/2018 1559   UROBILINOGEN 0.2 08/15/2015 1737   NITRITE NEGATIVE  07/18/2018 1559   LEUKOCYTESUR NEGATIVE 07/18/2018 1559   Sepsis Labs Invalid input(s): PROCALCITONIN,  WBC,  LACTICIDVEN     SIGNED:  Aline August, MD  [facility] 02/28/2021, 5:14 PM

## 2021-03-22 DEATH — deceased
